# Patient Record
Sex: Female | Born: 1950 | Race: White | Hispanic: No | Smoking: Never smoker
Health system: Southern US, Community
[De-identification: ages and names within clinical notes are randomized; demographics above are authoritative.]

## PROBLEM LIST (undated history)

## (undated) DIAGNOSIS — R609 Edema, unspecified: Secondary | ICD-10-CM

## (undated) DIAGNOSIS — K5730 Diverticulosis of large intestine without perforation or abscess without bleeding: Secondary | ICD-10-CM

## (undated) DIAGNOSIS — T7840XA Allergy, unspecified, initial encounter: Secondary | ICD-10-CM

## (undated) DIAGNOSIS — I10 Essential (primary) hypertension: Secondary | ICD-10-CM

## (undated) DIAGNOSIS — E119 Type 2 diabetes mellitus without complications: Secondary | ICD-10-CM

## (undated) DIAGNOSIS — M1990 Unspecified osteoarthritis, unspecified site: Secondary | ICD-10-CM

## (undated) DIAGNOSIS — J45909 Unspecified asthma, uncomplicated: Secondary | ICD-10-CM

## (undated) DIAGNOSIS — F341 Dysthymic disorder: Secondary | ICD-10-CM

## (undated) DIAGNOSIS — R51 Headache: Secondary | ICD-10-CM

## (undated) DIAGNOSIS — E785 Hyperlipidemia, unspecified: Secondary | ICD-10-CM

## (undated) DIAGNOSIS — R109 Unspecified abdominal pain: Secondary | ICD-10-CM

## (undated) DIAGNOSIS — E039 Hypothyroidism, unspecified: Secondary | ICD-10-CM

## (undated) DIAGNOSIS — D649 Anemia, unspecified: Secondary | ICD-10-CM

## (undated) DIAGNOSIS — M109 Gout, unspecified: Secondary | ICD-10-CM

## (undated) DIAGNOSIS — IMO0002 Reserved for concepts with insufficient information to code with codable children: Secondary | ICD-10-CM

## (undated) DIAGNOSIS — G2581 Restless legs syndrome: Secondary | ICD-10-CM

## (undated) DIAGNOSIS — F419 Anxiety disorder, unspecified: Secondary | ICD-10-CM

## (undated) HISTORY — DX: Type 2 diabetes mellitus without complications: E11.9

## (undated) HISTORY — PX: ABDOMINAL HYSTERECTOMY: SHX81

## (undated) HISTORY — DX: Hypothyroidism, unspecified: E03.9

## (undated) HISTORY — DX: Unspecified osteoarthritis, unspecified site: M19.90

## (undated) HISTORY — DX: Dysthymic disorder: F34.1

## (undated) HISTORY — DX: Restless legs syndrome: G25.81

## (undated) HISTORY — DX: Unspecified asthma, uncomplicated: J45.909

## (undated) HISTORY — PX: UPPER GASTROINTESTINAL ENDOSCOPY: SHX188

## (undated) HISTORY — DX: Unspecified abdominal pain: R10.9

## (undated) HISTORY — DX: Hyperlipidemia, unspecified: E78.5

## (undated) HISTORY — DX: Allergy, unspecified, initial encounter: T78.40XA

## (undated) HISTORY — DX: Diverticulosis of large intestine without perforation or abscess without bleeding: K57.30

## (undated) HISTORY — PX: OTHER SURGICAL HISTORY: SHX169

## (undated) HISTORY — DX: Anxiety disorder, unspecified: F41.9

## (undated) HISTORY — DX: Anemia, unspecified: D64.9

## (undated) HISTORY — DX: Edema, unspecified: R60.9

## (undated) HISTORY — DX: Reserved for concepts with insufficient information to code with codable children: IMO0002

## (undated) HISTORY — DX: Headache: R51

## (undated) HISTORY — PX: APPENDECTOMY: SHX54

## (undated) HISTORY — DX: Gout, unspecified: M10.9

## (undated) HISTORY — DX: Essential (primary) hypertension: I10

---

## 1999-02-16 ENCOUNTER — Encounter: Payer: Self-pay | Admitting: Family Medicine

## 1999-02-16 ENCOUNTER — Encounter: Admission: RE | Admit: 1999-02-16 | Discharge: 1999-02-16 | Payer: Self-pay | Admitting: Family Medicine

## 1999-12-01 ENCOUNTER — Other Ambulatory Visit: Admission: RE | Admit: 1999-12-01 | Discharge: 1999-12-01 | Payer: Self-pay | Admitting: Family Medicine

## 2000-03-12 ENCOUNTER — Encounter: Admission: RE | Admit: 2000-03-12 | Discharge: 2000-03-12 | Payer: Self-pay | Admitting: Family Medicine

## 2000-03-12 ENCOUNTER — Encounter: Payer: Self-pay | Admitting: Family Medicine

## 2000-12-30 ENCOUNTER — Other Ambulatory Visit: Admission: RE | Admit: 2000-12-30 | Discharge: 2000-12-30 | Payer: Self-pay | Admitting: Family Medicine

## 2001-03-17 ENCOUNTER — Encounter: Payer: Self-pay | Admitting: Family Medicine

## 2001-03-17 ENCOUNTER — Encounter: Admission: RE | Admit: 2001-03-17 | Discharge: 2001-03-17 | Payer: Self-pay | Admitting: Family Medicine

## 2002-01-27 ENCOUNTER — Encounter: Admission: RE | Admit: 2002-01-27 | Discharge: 2002-01-27 | Payer: Self-pay | Admitting: Family Medicine

## 2002-01-27 ENCOUNTER — Encounter: Payer: Self-pay | Admitting: Family Medicine

## 2002-02-12 ENCOUNTER — Other Ambulatory Visit: Admission: RE | Admit: 2002-02-12 | Discharge: 2002-02-12 | Payer: Self-pay | Admitting: Family Medicine

## 2002-05-19 ENCOUNTER — Encounter (INDEPENDENT_AMBULATORY_CARE_PROVIDER_SITE_OTHER): Payer: Self-pay | Admitting: Specialist

## 2002-05-19 ENCOUNTER — Inpatient Hospital Stay (HOSPITAL_COMMUNITY): Admission: RE | Admit: 2002-05-19 | Discharge: 2002-05-21 | Payer: Self-pay | Admitting: Obstetrics and Gynecology

## 2003-01-28 ENCOUNTER — Encounter: Admission: RE | Admit: 2003-01-28 | Discharge: 2003-01-28 | Payer: Self-pay | Admitting: Family Medicine

## 2003-01-28 ENCOUNTER — Encounter: Payer: Self-pay | Admitting: Family Medicine

## 2003-02-28 ENCOUNTER — Encounter: Admission: RE | Admit: 2003-02-28 | Discharge: 2003-02-28 | Payer: Self-pay | Admitting: Internal Medicine

## 2004-01-11 ENCOUNTER — Emergency Department (HOSPITAL_COMMUNITY): Admission: EM | Admit: 2004-01-11 | Discharge: 2004-01-11 | Payer: Self-pay | Admitting: Emergency Medicine

## 2004-02-15 ENCOUNTER — Ambulatory Visit: Payer: Self-pay | Admitting: Family Medicine

## 2004-02-22 ENCOUNTER — Ambulatory Visit: Payer: Self-pay | Admitting: Family Medicine

## 2004-03-14 ENCOUNTER — Encounter: Admission: RE | Admit: 2004-03-14 | Discharge: 2004-03-14 | Payer: Self-pay | Admitting: Family Medicine

## 2004-04-13 ENCOUNTER — Encounter: Admission: RE | Admit: 2004-04-13 | Discharge: 2004-04-13 | Payer: Self-pay | Admitting: Family Medicine

## 2005-01-08 ENCOUNTER — Ambulatory Visit: Payer: Self-pay | Admitting: Family Medicine

## 2005-01-08 ENCOUNTER — Observation Stay (HOSPITAL_COMMUNITY): Admission: EM | Admit: 2005-01-08 | Discharge: 2005-01-09 | Payer: Self-pay | Admitting: Emergency Medicine

## 2005-01-08 ENCOUNTER — Encounter (INDEPENDENT_AMBULATORY_CARE_PROVIDER_SITE_OTHER): Payer: Self-pay | Admitting: *Deleted

## 2005-05-24 ENCOUNTER — Ambulatory Visit: Payer: Self-pay | Admitting: Family Medicine

## 2005-05-24 ENCOUNTER — Encounter: Admission: RE | Admit: 2005-05-24 | Discharge: 2005-05-24 | Payer: Self-pay | Admitting: Family Medicine

## 2005-05-31 ENCOUNTER — Other Ambulatory Visit: Admission: RE | Admit: 2005-05-31 | Discharge: 2005-05-31 | Payer: Self-pay | Admitting: Obstetrics and Gynecology

## 2005-05-31 ENCOUNTER — Ambulatory Visit: Payer: Self-pay | Admitting: Family Medicine

## 2005-06-15 ENCOUNTER — Encounter: Admission: RE | Admit: 2005-06-15 | Discharge: 2005-06-15 | Payer: Self-pay | Admitting: Family Medicine

## 2005-09-24 ENCOUNTER — Ambulatory Visit: Payer: Self-pay | Admitting: Family Medicine

## 2006-01-28 ENCOUNTER — Ambulatory Visit: Payer: Self-pay | Admitting: Family Medicine

## 2006-05-21 ENCOUNTER — Ambulatory Visit: Payer: Self-pay | Admitting: Family Medicine

## 2006-05-21 LAB — CONVERTED CEMR LAB
ALT: 19 units/L (ref 0–40)
AST: 20 units/L (ref 0–37)
Albumin: 3.4 g/dL — ABNORMAL LOW (ref 3.5–5.2)
Alkaline Phosphatase: 82 units/L (ref 39–117)
BUN: 13 mg/dL (ref 6–23)
Basophils Absolute: 0.1 10*3/uL (ref 0.0–0.1)
Basophils Relative: 1 % (ref 0.0–1.0)
Bilirubin, Direct: 0.2 mg/dL (ref 0.0–0.3)
CO2: 31 meq/L (ref 19–32)
Calcium: 8.9 mg/dL (ref 8.4–10.5)
Chloride: 106 meq/L (ref 96–112)
Cholesterol: 172 mg/dL (ref 0–200)
Creatinine, Ser: 0.7 mg/dL (ref 0.4–1.2)
Eosinophils Absolute: 0.2 10*3/uL (ref 0.0–0.6)
Eosinophils Relative: 3.2 % (ref 0.0–5.0)
GFR calc Af Amer: 113 mL/min
GFR calc non Af Amer: 93 mL/min
Glucose, Bld: 106 mg/dL — ABNORMAL HIGH (ref 70–99)
HCT: 38 % (ref 36.0–46.0)
HDL: 41 mg/dL (ref >39.0)
Hemoglobin: 13.1 g/dL (ref 12.0–15.0)
Hgb A1c MFr Bld: 6.6 % — ABNORMAL HIGH (ref 4.6–6.0)
LDL Cholesterol: 98 mg/dL (ref 0–99)
Lymphocytes Relative: 25.9 % (ref 12.0–46.0)
MCHC: 34.4 g/dL (ref 30.0–36.0)
MCV: 92.1 fL (ref 78.0–100.0)
Monocytes Absolute: 0.3 10*3/uL (ref 0.2–0.7)
Monocytes Relative: 4.2 % (ref 3.0–11.0)
Neutro Abs: 3.9 10*3/uL (ref 1.4–7.7)
Neutrophils Relative %: 65.7 % (ref 43.0–77.0)
Platelets: 272 10*3/uL (ref 150–400)
Potassium: 3.7 meq/L (ref 3.5–5.1)
RBC: 4.13 M/uL (ref 3.87–5.11)
RDW: 13.5 % (ref 11.5–14.6)
Sodium: 144 meq/L (ref 135–145)
TSH: 2.88 microintl units/mL (ref 0.35–5.50)
Total Bilirubin: 0.5 mg/dL (ref 0.3–1.2)
Total CHOL/HDL Ratio: 4.2
Total Protein: 6.3 g/dL (ref 6.0–8.3)
Triglycerides: 163 mg/dL — ABNORMAL HIGH (ref 0–149)
VLDL: 33 mg/dL (ref 0–40)
WBC: 6.1 10*3/uL (ref 4.5–10.5)

## 2006-05-28 ENCOUNTER — Ambulatory Visit: Payer: Self-pay | Admitting: Family Medicine

## 2006-06-25 ENCOUNTER — Ambulatory Visit: Payer: Self-pay | Admitting: Internal Medicine

## 2006-06-25 ENCOUNTER — Encounter: Admission: RE | Admit: 2006-06-25 | Discharge: 2006-06-25 | Payer: Self-pay | Admitting: Family Medicine

## 2006-07-10 ENCOUNTER — Ambulatory Visit (HOSPITAL_COMMUNITY): Admission: RE | Admit: 2006-07-10 | Discharge: 2006-07-10 | Payer: Self-pay | Admitting: Obstetrics and Gynecology

## 2006-08-28 ENCOUNTER — Ambulatory Visit: Payer: Self-pay | Admitting: Family Medicine

## 2006-08-28 LAB — CONVERTED CEMR LAB
Basophils Absolute: 0 10*3/uL (ref 0.0–0.1)
Basophils Relative: 0.3 % (ref 0.0–1.0)
Eosinophils Absolute: 0.1 10*3/uL (ref 0.0–0.6)
Eosinophils Relative: 2.3 % (ref 0.0–5.0)
Glucose, Bld: 92 mg/dL (ref 70–99)
HCT: 41 % (ref 36.0–46.0)
Hemoglobin: 13.9 g/dL (ref 12.0–15.0)
Hgb A1c MFr Bld: 6.2 % — ABNORMAL HIGH (ref 4.6–6.0)
Lymphocytes Relative: 31.6 % (ref 12.0–46.0)
MCHC: 33.9 g/dL (ref 30.0–36.0)
MCV: 90.5 fL (ref 78.0–100.0)
Monocytes Absolute: 0.3 10*3/uL (ref 0.2–0.7)
Monocytes Relative: 5.6 % (ref 3.0–11.0)
Neutro Abs: 3.2 10*3/uL (ref 1.4–7.7)
Neutrophils Relative %: 60.2 % (ref 43.0–77.0)
Platelets: 297 10*3/uL (ref 150–400)
RBC: 4.53 M/uL (ref 3.87–5.11)
RDW: 12.9 % (ref 11.5–14.6)
Rheumatoid fact SerPl-aCnc: <20 intl units/mL — ABNORMAL LOW (ref 0.0–20.0)
Total CK: 92 units/L (ref 7–177)
WBC: 5.3 10*3/uL (ref 4.5–10.5)

## 2006-08-29 ENCOUNTER — Encounter: Payer: Self-pay | Admitting: Family Medicine

## 2006-08-29 LAB — CONVERTED CEMR LAB: Anti Nuclear Antibody(ANA): NEGATIVE

## 2006-10-02 ENCOUNTER — Encounter: Admission: RE | Admit: 2006-10-02 | Discharge: 2006-10-02 | Payer: Self-pay | Admitting: Orthopaedic Surgery

## 2006-12-11 DIAGNOSIS — E119 Type 2 diabetes mellitus without complications: Secondary | ICD-10-CM

## 2006-12-11 DIAGNOSIS — E039 Hypothyroidism, unspecified: Secondary | ICD-10-CM

## 2006-12-11 DIAGNOSIS — K5730 Diverticulosis of large intestine without perforation or abscess without bleeding: Secondary | ICD-10-CM | POA: Insufficient documentation

## 2006-12-11 DIAGNOSIS — IMO0002 Reserved for concepts with insufficient information to code with codable children: Secondary | ICD-10-CM | POA: Insufficient documentation

## 2006-12-11 DIAGNOSIS — R519 Headache, unspecified: Secondary | ICD-10-CM | POA: Insufficient documentation

## 2006-12-11 DIAGNOSIS — E1165 Type 2 diabetes mellitus with hyperglycemia: Secondary | ICD-10-CM

## 2006-12-11 DIAGNOSIS — R51 Headache: Secondary | ICD-10-CM

## 2006-12-11 HISTORY — DX: Type 2 diabetes mellitus without complications: E11.9

## 2006-12-11 HISTORY — DX: Hypothyroidism, unspecified: E03.9

## 2006-12-11 HISTORY — DX: Headache: R51

## 2006-12-11 HISTORY — DX: Diverticulosis of large intestine without perforation or abscess without bleeding: K57.30

## 2007-05-29 ENCOUNTER — Telehealth: Payer: Self-pay | Admitting: Family Medicine

## 2007-06-06 ENCOUNTER — Telehealth: Payer: Self-pay | Admitting: Family Medicine

## 2007-06-23 ENCOUNTER — Ambulatory Visit: Payer: Self-pay | Admitting: Family Medicine

## 2007-06-23 LAB — CONVERTED CEMR LAB
ALT: 18 units/L (ref 0–35)
AST: 17 units/L (ref 0–37)
Albumin: 3.7 g/dL (ref 3.5–5.2)
Alkaline Phosphatase: 75 units/L (ref 39–117)
BUN: 13 mg/dL (ref 6–23)
Basophils Absolute: 0 10*3/uL (ref 0.0–0.1)
Basophils Relative: 0.9 % (ref 0.0–1.0)
Bilirubin Urine: NEGATIVE
Bilirubin, Direct: 0.1 mg/dL (ref 0.0–0.3)
Blood in Urine, dipstick: NEGATIVE
CO2: 30 meq/L (ref 19–32)
Calcium: 9 mg/dL (ref 8.4–10.5)
Chloride: 109 meq/L (ref 96–112)
Cholesterol: 161 mg/dL (ref 0–200)
Creatinine, Ser: 0.9 mg/dL (ref 0.4–1.2)
Creatinine,U: 151.2 mg/dL
Eosinophils Absolute: 0.2 10*3/uL (ref 0.0–0.6)
Eosinophils Relative: 2.9 % (ref 0.0–5.0)
GFR calc Af Amer: 84 mL/min
GFR calc non Af Amer: 69 mL/min
Glucose, Bld: 98 mg/dL (ref 70–99)
Glucose, Urine, Semiquant: NEGATIVE
HCT: 40.5 % (ref 36.0–46.0)
HDL: 41.2 mg/dL (ref >39.0)
Hemoglobin: 13.1 g/dL (ref 12.0–15.0)
Hgb A1c MFr Bld: 6.2 % — ABNORMAL HIGH (ref 4.6–6.0)
Ketones, urine, test strip: NEGATIVE
LDL Cholesterol: 83 mg/dL (ref 0–99)
Lymphocytes Relative: 31.8 % (ref 12.0–46.0)
MCHC: 32.3 g/dL (ref 30.0–36.0)
MCV: 93.1 fL (ref 78.0–100.0)
Microalb Creat Ratio: 5.3 mg/g (ref 0.0–30.0)
Microalb, Ur: 0.8 mg/dL (ref 0.0–1.9)
Monocytes Absolute: 0.3 10*3/uL (ref 0.2–0.7)
Monocytes Relative: 6.2 % (ref 3.0–11.0)
Neutro Abs: 3.3 10*3/uL (ref 1.4–7.7)
Neutrophils Relative %: 58.2 % (ref 43.0–77.0)
Nitrite: NEGATIVE
Platelets: 248 10*3/uL (ref 150–400)
Potassium: 3.7 meq/L (ref 3.5–5.1)
Protein, U semiquant: NEGATIVE
RBC: 4.35 M/uL (ref 3.87–5.11)
RDW: 12.8 % (ref 11.5–14.6)
Sodium: 145 meq/L (ref 135–145)
Specific Gravity, Urine: 1.02
TSH: 1.31 microintl units/mL (ref 0.35–5.50)
Total Bilirubin: 1 mg/dL (ref 0.3–1.2)
Total CHOL/HDL Ratio: 3.9
Total Protein: 6.3 g/dL (ref 6.0–8.3)
Triglycerides: 182 mg/dL — ABNORMAL HIGH (ref 0–149)
Urobilinogen, UA: 0.2
VLDL: 36 mg/dL (ref 0–40)
WBC: 5.5 10*3/uL (ref 4.5–10.5)
pH: 6

## 2007-07-17 ENCOUNTER — Encounter: Admission: RE | Admit: 2007-07-17 | Discharge: 2007-07-17 | Payer: Self-pay | Admitting: Family Medicine

## 2007-08-14 ENCOUNTER — Ambulatory Visit: Payer: Self-pay | Admitting: Family Medicine

## 2007-08-14 DIAGNOSIS — R109 Unspecified abdominal pain: Secondary | ICD-10-CM

## 2007-08-14 DIAGNOSIS — IMO0002 Reserved for concepts with insufficient information to code with codable children: Secondary | ICD-10-CM

## 2007-08-14 HISTORY — DX: Unspecified abdominal pain: R10.9

## 2007-08-14 HISTORY — DX: Reserved for concepts with insufficient information to code with codable children: IMO0002

## 2007-09-15 ENCOUNTER — Ambulatory Visit: Payer: Self-pay | Admitting: Family Medicine

## 2007-09-15 DIAGNOSIS — F341 Dysthymic disorder: Secondary | ICD-10-CM

## 2007-09-15 HISTORY — DX: Dysthymic disorder: F34.1

## 2008-08-06 ENCOUNTER — Ambulatory Visit: Payer: Self-pay | Admitting: Family Medicine

## 2008-08-06 ENCOUNTER — Encounter: Admission: RE | Admit: 2008-08-06 | Discharge: 2008-08-06 | Payer: Self-pay | Admitting: Family Medicine

## 2008-08-06 LAB — CONVERTED CEMR LAB
ALT: 27 units/L (ref 0–35)
AST: 22 units/L (ref 0–37)
Albumin: 3.5 g/dL (ref 3.5–5.2)
Alkaline Phosphatase: 80 units/L (ref 39–117)
BUN: 14 mg/dL (ref 6–23)
Basophils Absolute: 0 10*3/uL (ref 0.0–0.1)
Basophils Relative: 0.3 % (ref 0.0–3.0)
Bilirubin Urine: NEGATIVE
Bilirubin, Direct: 0.1 mg/dL (ref 0.0–0.3)
Blood in Urine, dipstick: NEGATIVE
CO2: 33 meq/L — ABNORMAL HIGH (ref 19–32)
Calcium: 8.9 mg/dL (ref 8.4–10.5)
Chloride: 105 meq/L (ref 96–112)
Cholesterol: 136 mg/dL (ref 0–200)
Creatinine, Ser: 0.8 mg/dL (ref 0.4–1.2)
Creatinine,U: 319.7 mg/dL
Eosinophils Absolute: 0.2 10*3/uL (ref 0.0–0.7)
Eosinophils Relative: 3 % (ref 0.0–5.0)
GFR calc non Af Amer: 78.83 mL/min (ref >60)
Glucose, Bld: 83 mg/dL (ref 70–99)
Glucose, Urine, Semiquant: NEGATIVE
HCT: 38.7 % (ref 36.0–46.0)
HDL: 35.1 mg/dL — ABNORMAL LOW (ref >39.00)
Hemoglobin: 13.3 g/dL (ref 12.0–15.0)
Hgb A1c MFr Bld: 6.4 % (ref 4.6–6.5)
LDL Cholesterol: 73 mg/dL (ref 0–99)
Lymphocytes Relative: 30.8 % (ref 12.0–46.0)
Lymphs Abs: 1.8 10*3/uL (ref 0.7–4.0)
MCHC: 34.4 g/dL (ref 30.0–36.0)
MCV: 90.4 fL (ref 78.0–100.0)
Microalb Creat Ratio: 2.8 mg/g (ref 0.0–30.0)
Microalb, Ur: 0.9 mg/dL (ref 0.0–1.9)
Monocytes Absolute: 0.3 10*3/uL (ref 0.1–1.0)
Monocytes Relative: 6.1 % (ref 3.0–12.0)
Neutro Abs: 3.4 10*3/uL (ref 1.4–7.7)
Neutrophils Relative %: 59.8 % (ref 43.0–77.0)
Nitrite: NEGATIVE
Platelets: 235 10*3/uL (ref 150.0–400.0)
Potassium: 3.3 meq/L — ABNORMAL LOW (ref 3.5–5.1)
RBC: 4.28 M/uL (ref 3.87–5.11)
RDW: 12.8 % (ref 11.5–14.6)
Sodium: 143 meq/L (ref 135–145)
Specific Gravity, Urine: 1.025
TSH: 3.4 microintl units/mL (ref 0.35–5.50)
Total Bilirubin: 0.6 mg/dL (ref 0.3–1.2)
Total CHOL/HDL Ratio: 4
Total Protein: 6.5 g/dL (ref 6.0–8.3)
Triglycerides: 141 mg/dL (ref 0.0–149.0)
Urobilinogen, UA: 0.2
VLDL: 28.2 mg/dL (ref 0.0–40.0)
WBC Urine, dipstick: NEGATIVE
WBC: 5.7 10*3/uL (ref 4.5–10.5)
pH: 5.5

## 2008-08-20 ENCOUNTER — Ambulatory Visit: Payer: Self-pay | Admitting: Family Medicine

## 2008-09-16 DIAGNOSIS — J069 Acute upper respiratory infection, unspecified: Secondary | ICD-10-CM | POA: Insufficient documentation

## 2008-09-21 ENCOUNTER — Ambulatory Visit: Payer: Self-pay | Admitting: Family Medicine

## 2009-01-15 DIAGNOSIS — R609 Edema, unspecified: Secondary | ICD-10-CM

## 2009-01-15 HISTORY — DX: Edema, unspecified: R60.9

## 2009-01-18 ENCOUNTER — Ambulatory Visit: Payer: Self-pay

## 2009-01-18 ENCOUNTER — Ambulatory Visit: Payer: Self-pay | Admitting: Family Medicine

## 2009-05-15 DIAGNOSIS — J45909 Unspecified asthma, uncomplicated: Secondary | ICD-10-CM

## 2009-05-15 HISTORY — DX: Unspecified asthma, uncomplicated: J45.909

## 2009-05-17 ENCOUNTER — Ambulatory Visit: Payer: Self-pay | Admitting: Family Medicine

## 2009-05-19 ENCOUNTER — Ambulatory Visit: Payer: Self-pay | Admitting: Family Medicine

## 2009-09-22 ENCOUNTER — Telehealth: Payer: Self-pay | Admitting: Family Medicine

## 2009-09-28 ENCOUNTER — Ambulatory Visit: Payer: Self-pay | Admitting: Family Medicine

## 2009-09-28 LAB — CONVERTED CEMR LAB
ALT: 20 units/L (ref 0–35)
AST: 22 units/L (ref 0–37)
Albumin: 4.1 g/dL (ref 3.5–5.2)
Alkaline Phosphatase: 82 units/L (ref 39–117)
BUN: 19 mg/dL (ref 6–23)
Basophils Absolute: 0 10*3/uL (ref 0.0–0.1)
Basophils Relative: 0.5 % (ref 0.0–3.0)
Bilirubin Urine: NEGATIVE
Bilirubin, Direct: 0.2 mg/dL (ref 0.0–0.3)
Blood in Urine, dipstick: NEGATIVE
CO2: 32 meq/L (ref 19–32)
Calcium: 9.6 mg/dL (ref 8.4–10.5)
Chloride: 102 meq/L (ref 96–112)
Cholesterol: 182 mg/dL (ref 0–200)
Creatinine, Ser: 0.8 mg/dL (ref 0.4–1.2)
Creatinine,U: 190.7 mg/dL
Eosinophils Absolute: 0.2 10*3/uL (ref 0.0–0.7)
Eosinophils Relative: 2.9 % (ref 0.0–5.0)
GFR calc non Af Amer: 80.83 mL/min (ref >60)
Glucose, Bld: 140 mg/dL — ABNORMAL HIGH (ref 70–99)
Glucose, Urine, Semiquant: NEGATIVE
HCT: 40.3 % (ref 36.0–46.0)
HDL: 46.8 mg/dL (ref >39.00)
Hemoglobin: 13.9 g/dL (ref 12.0–15.0)
Hgb A1c MFr Bld: 6.4 % (ref 4.6–6.5)
Ketones, urine, test strip: NEGATIVE
LDL Cholesterol: 103 mg/dL — ABNORMAL HIGH (ref 0–99)
Lymphocytes Relative: 31.2 % (ref 12.0–46.0)
Lymphs Abs: 1.7 10*3/uL (ref 0.7–4.0)
MCHC: 34.5 g/dL (ref 30.0–36.0)
MCV: 91.4 fL (ref 78.0–100.0)
Microalb Creat Ratio: 0.5 mg/g (ref 0.0–30.0)
Microalb, Ur: 1 mg/dL (ref 0.0–1.9)
Monocytes Absolute: 0.4 10*3/uL (ref 0.1–1.0)
Monocytes Relative: 7.3 % (ref 3.0–12.0)
Neutro Abs: 3.2 10*3/uL (ref 1.4–7.7)
Neutrophils Relative %: 58.1 % (ref 43.0–77.0)
Nitrite: NEGATIVE
Platelets: 237 10*3/uL (ref 150.0–400.0)
Potassium: 4.3 meq/L (ref 3.5–5.1)
Protein, U semiquant: NEGATIVE
RBC: 4.41 M/uL (ref 3.87–5.11)
RDW: 13.4 % (ref 11.5–14.6)
Sodium: 142 meq/L (ref 135–145)
Specific Gravity, Urine: 1.025
TSH: 3.48 microintl units/mL (ref 0.35–5.50)
Total Bilirubin: 0.9 mg/dL (ref 0.3–1.2)
Total CHOL/HDL Ratio: 4
Total Protein: 6.7 g/dL (ref 6.0–8.3)
Triglycerides: 161 mg/dL — ABNORMAL HIGH (ref 0.0–149.0)
Urobilinogen, UA: 0.2
VLDL: 32.2 mg/dL (ref 0.0–40.0)
WBC: 5.5 10*3/uL (ref 4.5–10.5)
pH: 6

## 2009-10-06 ENCOUNTER — Encounter: Admission: RE | Admit: 2009-10-06 | Discharge: 2009-10-06 | Payer: Self-pay | Admitting: Family Medicine

## 2009-10-06 ENCOUNTER — Ambulatory Visit: Payer: Self-pay | Admitting: Family Medicine

## 2009-10-08 LAB — HM MAMMOGRAPHY: HM Mammogram: NEGATIVE

## 2010-05-07 ENCOUNTER — Encounter: Payer: Self-pay | Admitting: Orthopaedic Surgery

## 2010-05-07 ENCOUNTER — Encounter: Payer: Self-pay | Admitting: Obstetrics and Gynecology

## 2010-05-18 NOTE — Progress Notes (Signed)
Summary: refill  Phone Note Refill Request Message from:  Fax from Pharmacy on September 22, 2009 10:55 AM  Refills Requested: Medication #1:  SYNTHROID 88 MCG  TABS Take 1 tablet by mouth once a day Initial call taken by: Kern Reap CMA Duncan Dull),  September 22, 2009 10:55 AM    Prescriptions: SYNTHROID 88 MCG  TABS (LEVOTHYROXINE SODIUM) Take 1 tablet by mouth once a day  #100 Tablet x 0   Entered by:   Kern Reap CMA (AAMA)   Authorized by:   Roderick Pee MD   Signed by:   Kern Reap CMA (AAMA) on 09/22/2009   Method used:   Faxed to ...       [facility])       [address] [address]       [place], [place]  [address]       Ph: [phone]       Fax: [phone]   RxID:   [id number]

## 2010-05-18 NOTE — Letter (Signed)
Summary: Out of Work  Adult nurse at [employer]  [address]   [place], [place] [address]   Phone: [phone]  Fax: [phone]    May 19, 2009   Employee:  KEIRSTAN IANNELLO [facility]    To Whom It May Concern:   For Medical reasons, please excuse the above named employee from work for the following dates:  Start:   May 17, 2009  End:   May 23, 2009  If you need additional information, please feel free to contact our office.         Sincerely,    Kelle Darting, MD
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
## 2010-05-18 NOTE — Assessment & Plan Note (Signed)
Summary: COUGH,CONGESTION,FEVER // RS   Vital Signs:  Patient profile:   60 year old female Menstrual status:  hysterectomy Weight:      174 pounds Temp:     97.6 degrees F BP sitting:   120 / 80  (left arm) Cuff size:   regular  Vitals Entered By: Kern Reap CMA Duncan Dull) (May 17, 2009 12:08 PM)  Reason for Visit cough, and sycope  History of Present Illness: [name] is a 60 year old, married female, nonsmoker, but who has been exposed to secondhand smoke in the past and has no history of asthma nor allergies who comes in today for evaluation of wheezing.  She states she felt well until Sunday when she developed head congestion, sore throat, cough, and wheezing.  Temp was 102.  No fever since then.  No sputum production.  Last night.  She coughs so hard she passed out.  She did not injure herself.  She has no earache, sore throat, nausea, vomiting, or diarrhea.  She did have her seasonal flu shot.  Allergies: 1)  ! Benadryl 2)  ! Promethazine Hcl (Promethazine Hcl) 3)  ! Talwin 4)  ! Duradryl 5)  ! Quinine 6)  ! Asa 7)  ! Codeine 8)  ! Hydrocodone 9)  ! * Trimox 10)  ! Indocin  Past History:  Past medical, surgical, family and social histories (including risk factors) reviewed, and no changes noted (except as noted below).  Past Medical History: Reviewed history from 12/11/2006 and no changes required. Diabetes mellitus, type II Diverticulosis, colon Headache Hypothyroidism DUB (PMS) Insomnia  Past Surgical History: Reviewed history from 12/11/2006 and no changes required. Hysterectomy Appendectomy  Family History: Reviewed history from 08/14/2007 and no changes required. father died 54, COPD mother died at 41, diabetes, and COPD  No brothers.  Five sisters two diabetics one has a goiter.  The other two in good health  Social History: Reviewed history from 08/14/2007 and no changes required. Never Smoked Occupation: [employer] in [employer],  decreased hours because the economy Married Alcohol use-no Drug use-no Regular exercise-no  Review of Systems      See HPI  Physical Exam  General:  Well-developed,well-nourished,in no acute distress; alert,appropriate and cooperative throughout examination Head:  Normocephalic and atraumatic without obvious abnormalities. No apparent alopecia or balding. Eyes:  No corneal or conjunctival inflammation noted. EOMI. Perrla. Funduscopic exam benign, without hemorrhages, exudates or papilledema. Vision grossly normal. Ears:  External ear exam shows no significant lesions or deformities.  Otoscopic examination reveals clear canals, tympanic membranes are intact bilaterally without bulging, retraction, inflammation or discharge. Hearing is grossly normal bilaterally. Nose:  External nasal examination shows no deformity or inflammation. Nasal mucosa are pink and moist without lesions or exudates. Mouth:  Oral mucosa and oropharynx without lesions or exudates.  Teeth in good repair. Neck:  No deformities, masses, or tenderness noted. Chest Wall:  No deformities, masses, or tenderness noted. Lungs:  symmetrical breath sounds bilateral wheezing   Problems:  Medical Problems Added: 1)  Dx of Asthma  (ICD-493.90)  Impression & Recommendations:  Problem # 1:  ASTHMA (ICD-493.90) Assessment New  Her updated medication list for this problem includes:    Prednisone 20 Mg Tabs (Prednisone) ..... Uad  Orders: Prescription Created Electronically [phone])  Complete Medication List: 1)  Synthroid 88 Mcg Tabs (Levothyroxine sodium) .... Take 1 tablet by mouth once a day 2)  Flexeril 10 Mg Tabs (Cyclobenzaprine hcl) .... Take 1 tablet by mouth at bedtime 3)  Mirapex 1 Mg Tabs (Pramipexole dihydrochloride) .... Take 1 tablet by mouth at bedtime 4)  Tenoretic 50 50-25 Mg Tabs (Atenolol-chlorthalidone) .... One by mouth daily 5)  Sertraline Hcl 100 Mg Tabs (Sertraline hcl) .Marland Kitchen.. 1 tab @ bedtime 6)   Darvocet-n 100 100-650 Mg Tabs (Propoxyphene n-apap) 7)  Calcium 600 Mg Tabs (Calcium) .... Take one tablet daily 8)  Vitamin D  9)  Eq Fiber Therapy 0.52 Gm Caps (Psyllium) .... Once daily 10)  Daily-vitamin Tabs (Multiple vitamin) .... Once daily 11)  Derma Patch  .... Use for vertigo 12)  Adult Aspirin Ec Low Strength 81 Mg Tbec (Aspirin) .... Once daily 13)  Dermotic 0.01 % Oil (Fluocinolone acetonide) .... Once daily 14)  Pred Forte 1 % Susp (Prednisolone acetate) .... Once daily 15)  Premarin 0.625 Mg/gm Crea (Estrogens, conjugated) .... Apply 2 x week 16)  Transderm-scop 1.5 Mg Pt72 (Scopolamine base) .... One patch q 3 days as needed 17)  Hydrochlorothiazide 25 Mg Tabs (Hydrochlorothiazide) .... Take 1 tablet by mouth every morning 18)  Prednisone 20 Mg Tabs (Prednisone) .... Uad 19)  Doxycycline Hyclate 100 Mg Caps (Doxycycline hyclate) .... Take 1 tablet by mouth two times a day 20)  Hydrocodone-homatropine 5-1.5 Mg/4ml Syrp (Hydrocodone-homatropine) .Marland Kitchen.. 1 or 2 tsps three times a day as needed  Patient Instructions: 1)  drink 30 ounces of water daily. 2)  It may take one or 2 teaspoons of Hydromet up to 3 times a day as needed for cough. 3)  Begin doxycycline 100 mg b.i.d. 4)  Begin prednisone two tabs now then two tabs q.a.m. recheck Thursday here in the office. 5)  Rest at home do not work Prescriptions: HYDROCODONE-HOMATROPINE 5-1.5 MG/5ML SYRP (HYDROCODONE-HOMATROPINE) 1 or 2 tsps three times a day as needed  #8oz x 1   Entered and Authorized by:   Roderick Pee MD   Signed by:   Roderick Pee MD on 05/17/2009   Method used:   Print then Give to Patient   RxID:   [id number] DOXYCYCLINE HYCLATE 100 MG CAPS (DOXYCYCLINE HYCLATE) Take 1 tablet by mouth two times a day  #20 x 9   Entered and Authorized by:   Roderick Pee MD   Signed by:   Roderick Pee MD on 05/17/2009   Method used:   Electronically to        [facility] [phone]* (retail)       [address]  [place], [place]  [address]       Ph: [phone] or [phone]       Fax: [phone]   RxID:   [id number] PREDNISONE 20 MG TABS (PREDNISONE) UAD  #40 x 0   Entered and Authorized by:   Roderick Pee MD   Signed by:   Roderick Pee MD on 05/17/2009   Method used:   Electronically to        [facility] [phone]* (retail)       [address] [place], [place]  [address]       Ph: [phone] or [phone]       Fax: [phone]   RxID:   [id number]

## 2010-05-18 NOTE — Assessment & Plan Note (Signed)
Summary: 2 day rov/njr   Vital Signs:  Patient profile:   60 year old female Menstrual status:  hysterectomy Temp:     97.5 degrees F BP sitting:   120 / 84  (left arm)  Vitals Entered By: Kern Reap CMA Duncan Dull) (May 19, 2009 1:39 PM)  Contraindications/Deferment of Procedures/Staging:    Test/Procedure: Weight Refused    Reason for deferment: patient declined-cannot calculate BMI   History of Present Illness: [name] is a 60 year old female, nonsmoker, who comes back today for evaluation of asthma.  We saw her 3 days ago with a viral infection that triggered her asthma.  She spent bed rest at home on prednisone and doxycycline and Hydromet cough syrup.  She is much better.  She sevens, and nausea, probably related to Hydromet.  No other side effects.    Allergies: 1)  ! Benadryl 2)  ! Promethazine Hcl (Promethazine Hcl) 3)  ! Talwin 4)  ! Duradryl 5)  ! Quinine 6)  ! Asa 7)  ! Codeine 8)  ! Hydrocodone 9)  ! * Trimox 10)  ! Indocin  Review of Systems      See HPI  Physical Exam  General:  Well-developed,well-nourished,in no acute distress; alert,appropriate and cooperative throughout examination Head:  Normocephalic and atraumatic without obvious abnormalities. No apparent alopecia or balding. Eyes:  No corneal or conjunctival inflammation noted. EOMI. Perrla. Funduscopic exam benign, without hemorrhages, exudates or papilledema. Vision grossly normal. Ears:  External ear exam shows no significant lesions or deformities.  Otoscopic examination reveals clear canals, tympanic membranes are intact bilaterally without bulging, retraction, inflammation or discharge. Hearing is grossly normal bilaterally. Nose:  External nasal examination shows no deformity or inflammation. Nasal mucosa are pink and moist without lesions or exudates. Mouth:  Oral mucosa and oropharynx without lesions or exudates.  Teeth in good repair. Neck:  No deformities, masses, or tenderness  noted. Chest Wall:  No deformities, masses, or tenderness noted. Lungs:  symmetrical breath sounds, late expiratory wheezing   Impression & Recommendations:  Problem # 1:  ASTHMA (ICD-493.90) Assessment Improved  Her updated medication list for this problem includes:    Prednisone 20 Mg Tabs (Prednisone) ..... Uad  Complete Medication List: 1)  Synthroid 88 Mcg Tabs (Levothyroxine sodium) .... Take 1 tablet by mouth once a day 2)  Flexeril 10 Mg Tabs (Cyclobenzaprine hcl) .... Take 1 tablet by mouth at bedtime 3)  Mirapex 1 Mg Tabs (Pramipexole dihydrochloride) .... Take 1 tablet by mouth at bedtime 4)  Tenoretic 50 50-25 Mg Tabs (Atenolol-chlorthalidone) .... One by mouth daily 5)  Sertraline Hcl 100 Mg Tabs (Sertraline hcl) .Marland Kitchen.. 1 tab @ bedtime 6)  Darvocet-n 100 100-650 Mg Tabs (Propoxyphene n-apap) 7)  Calcium 600 Mg Tabs (Calcium) .... Take one tablet daily 8)  Vitamin D  9)  Eq Fiber Therapy 0.52 Gm Caps (Psyllium) .... Once daily 10)  Daily-vitamin Tabs (Multiple vitamin) .... Once daily 11)  Derma Patch  .... Use for vertigo 12)  Adult Aspirin Ec Low Strength 81 Mg Tbec (Aspirin) .... Once daily 13)  Dermotic 0.01 % Oil (Fluocinolone acetonide) .... Once daily 14)  Pred Forte 1 % Susp (Prednisolone acetate) .... Once daily 15)  Premarin 0.625 Mg/gm Crea (Estrogens, conjugated) .... Apply 2 x week 16)  Transderm-scop 1.5 Mg Pt72 (Scopolamine base) .... One patch q 3 days as needed 17)  Hydrochlorothiazide 25 Mg Tabs (Hydrochlorothiazide) .... Take 1 tablet by mouth every morning 18)  Prednisone 20 Mg Tabs (  Prednisone) .... Uad 19)  Doxycycline Hyclate 100 Mg Caps (Doxycycline hyclate) .... Take 1 tablet by mouth two times a day 20)  Hydrocodone-homatropine 5-1.5 Mg/66ml Syrp (Hydrocodone-homatropine) .Marland Kitchen.. 1 or 2 tsps three times a day as needed  Patient Instructions: 1)  begin to taper the prednisone by taking one tablet x 3 days, then half a tablet x 3 days, then half a  tablet every other day for two week taper. 2)  Decrease E. Hydromet two one half or 1 teaspoon 3 times a day. 3)  If this is not resolve the nausea stopped the doxycycline 4)  Please schedule a follow-up appointment as needed.

## 2010-09-01 NOTE — H&P (Signed)
[name], [name] NO.:  [id number]   MEDICAL RECORD NO.:  [id number]          PATIENT TYPE:  EMS   LOCATION:  [facility]                         FACILITY:  [facility]   PHYSICIAN:  Eugenio Hoes. Tawanna Cooler, M.D. [facility] [place] OF BIRTH:  14-Jun-1950   DATE OF ADMISSION:  01/08/2005  DATE OF DISCHARGE:                                HISTORY & PHYSICAL   CHIEF COMPLAINT:  Abdominal pain.   HISTORY OF PRESENT ILLNESS:  [name] is a 60 year old female who  began experiencing suprapubic and right lower quadrant pain early this  morning around 7 a.m.  The pain was associated with nausea.  She promptly  saw her primary care physician, Tinnie Gens A. Tawanna Cooler, M.D. [facility] and he suspected  appendicitis and sent her to the [facility] emergency room.  We  have asked to consult on this patient.  Lab work and CT scan are pending.   ALLERGIES:  BENADRYL causes anaphylaxis, ERGOTAMINE and PROMETHAZINE create  a hot/cold intolerance.  ASPIRIN and CODEINE cause nausea and vomiting.  TALWIN has a history of syncope in this patient.  DURADRIN rash.  TRIMOX and  HYDROCODONE the patient states she has an allergy but is uncertain of what  that allergy is.   MEDICATIONS:  1.  Synthroid 88 mcg daily.  2.  TUMS daily.  3.  Multivitamin daily.  4.  Flexeril 10 mg p.o. nightly.  5.  Zoloft 100 mg a day.  6.  Mirapex one p.o. nightly.  7.  Tenoretic 50/25 one p.o. daily.  8.  Prozac 40 mg a day.   PAST MEDICAL HISTORY:  Hypothyroidism, treated.  Status post total abdominal  hysterectomy, laparoscopic, no oophorectomy at that time.  Hypertension,  migraines, restless leg syndrome.  She has multiple medication intolerances  as above.   FAMILY HISTORY:  Emphysema in both parents who are deceased.   REVIEW OF SYSTEMS:  As above.  Otherwise negative.   PHYSICAL EXAMINATION:  VITAL SIGNS:  Temperature 98.8, pulse 92, blood  pressure 115/77, respirations 20.  HEENT:  Grossly normal.  No carotid  bruits, no JVD or thyromegaly. Sclerae  clear, conjunctivae normal, nares without drainage.  HEART:  Regular rate and rhythm, no murmur, rub, or ectopy.  CHEST:  Clear to auscultation bilaterally.  ABDOMEN:  Diffusely tender.  She has guarding.  She does have rebound and no  rigidity.  She has tenderness in the suprapubic and right lower quadrant  region.  EXTREMITIES:  No peripheral edema.  Skin warm and dry.  In general, she  appears to be in mild distress.   CT scan and laboratory studies are pending.   IMPRESSION:  Right lower quadrant abdominal pain suspicious for  appendicitis.   PLAN:  IV fluids, IV antibiotics, IV pain medications, and we will await CT  scan.      Guy Franco, P.A.    ______________________________  Eugenio Hoes Tawanna Cooler, M.D. [facility]    LB/MEDQ  D:  01/08/2005  T:  01/09/2005  Job:  [id number]   cc:   Carrington Clamp, M.D.  Fax: [phone]   Currie Paris, M.D.  [address], [address]  [place]  [place] [address]

## 2010-09-01 NOTE — Op Note (Signed)
NAMELAMEISHA, [name]                    ACCOUNT NO.:  [id number]   MEDICAL RECORD NO.:  [id number]                   PATIENT TYPE:  INP   LOCATION:  9322                                 FACILITY:  [facility]   PHYSICIAN:  Carrington Clamp, M.D.              DATE OF BIRTH:  Jul 26, 1950   DATE OF PROCEDURE:  05/19/2002  DATE OF DISCHARGE:                                 OPERATIVE REPORT   PREOPERATIVE DIAGNOSES:  1. Postmenopausal bleeding.  2. Stress urinary incontinence.  3. Cystocele.   POSTOPERATIVE DIAGNOSES:  1. Postmenopausal bleeding.  2. Stress urinary incontinence.  3. Cystocele.   PROCEDURE:  1. Total vaginal hysterectomy.  2. Tension-free vaginal tape abdominal approach, Gynecare.  3. Cystoscopy.  4. Anterior repair.   ATTENDING:  Carrington Clamp, M.D.   ASSISTANT:  Luvenia Redden, M.D.   ANESTHESIA:  General endotracheal anesthesia.   ESTIMATED BLOOD LOSS:  450 mL.   IV FLUIDS:  2700 mL.   URINE OUTPUT:  Clear, not measured.   COMPLICATIONS:  None.   FINDINGS:  Small uterus.  Small, normal postmenopausal ovaries.  Cystocele  with -1 presentation.  Rectocele -2 presentation.   MEDICATIONS:  1. Pitressin.  2. Sterile milk.  3. Xylocaine.   PATHOLOGY:  Uterus and cervix.  Vaginal pack is in place.   TECHNIQUE:  After adequate general anesthesia was achieved patient was  prepped and draped in usual sterile fashion in dorsal lithotomy position.  The bladder was emptied with a red rubber catheter and 60 mL of sterile milk  was instilled.  Red rubber was withdrawn and the D__________ retractor was  placed in the vagina and the cervix grasped with a Leahey clamp.  The cervix  was then injected with approximately 10 mL of 20 and 100 Pitressin  circumferentially.  Circumferential incision was then made with the scalpel  at the level of the reflection of the vagina up to the cervix.   Posteriorly the cul-de-sac was entered into with the Mayo scissors and  the  long duck bull retractor placed.  The section of the vesicouterine fascia  and the bladder away from the cervix was begun with Metzenbaum scissors.  Heaney clamps were then used bilaterally to clamp the uterosacral ligaments.  Each pedicle was secured with a Heaney stitch of 0 Vicryl after being  incised with the Mayo scissors.  The dissection of the bladder off of the  cervix was then continued until the anterior peritoneum was entered into and  then retracted away.  The cardinal ligament was then divided with  alternating successives bites of the Heaney clamp.  Each pedicle was incised  with the Mayo scissors and secured with a stitch of 0 Vicryl.  This  continued all the way up through the broad ligament until finally Heaney  clamps could be placed bilaterally over the uterine ovarian ligament and  tube at the cornu of the uterus.  Each pedicle was incised  with the Mayo  scissors.  The pedicles were regrasped with Heaney's and trimmed.  The  ovaries were inspected at this point and found to be very small and very  high up in the pelvis.  Because it did not come down easily it was decided  to leave them, especially since they looked so normal.  Each uterine ovarian  ligament pedicle was then secured with a free hand tie of 0 Vicryl followed  by a stitch of 0 Vicryl.  Hemostasis was achieved.  The peritoneum was then  closed with a running purse-string stitch of 2-0 Vicryl that incorporated  each uterosacral and in between a purse-string stitch modified Moskowitz of  the descending sigmoid.  This was closed down in a purse-string fashion,  thus closing the peritoneum.  The attention was then turned to the anterior  vaginal wall and the anterior vaginal cuff which was grasped with a pair of  Allis'.  The anterior vaginal wall mucosa was then reflected from the  underlying vesicovaginal fascia with sharp and blunt dissection of the  Metzenbaum scissors in the midline and incising the  mucosa in the midline.  Lateral dissection was then carried out with the aid of the Metzenbaum's.  Two mattress stitches of 0 Vicryl were placed laterally in the vesicouterine  fascia, thus closing the vesicouterine fascia.  There was a small amount of  bleeding and Bovie cautery and a small 3-0 Vicryl stitch was used in the  patient's left vesicovaginal fascia.   Attention was then turned to the abdomen where two stab incisions were made  3 cm off the midline just above the pubic symphysis.  Each incision was then  injected with 10 mL of 0.5% Xylocaine into the space of Retzius.  The  Gynecare abdominal _______  were then placed perpendicular to the patient's  rectus fascia and through the rectus fascia and then rocked forward  underneath the pubic symphysis to come out through the pelvic diaphragm  lateral to the urethra.  This was done bilaterally.  At this time cystoscopy  was performed and there were no needles noted in the bladder.  Inspection of  the entire bladder was undertaken including seeing the indigo carmine from  the ureteral orifices which were working fine.  The vaginal needles were  then attached to the abdominal needles and pushed back up through the space  through the abdominal wall and the sheath was removed on the tape while a  dilator was placed just on the urethra to ensure no tension.  The dilator  was removed.  The tape was checked and found to be in place without too much  tension.  The tapes were cut just underneath the skin.  Each of the stab  incisions were then closed with Dermabond.  Another stitch of 2-0 Vicryl was  used just lateral to the tape on the vaginal mucosa wall to ensure  hemostasis on the patient's right-hand side.   The cuff was closed with three figure-of-eight stitches.  The vaginal mucosa  was then trimmed and the vaginal mucosa closed in a running locked stitch of 2-0 Vicryl.  The rectocele was reinspected and found to be minor.  The   sphincter, although thinned anteriorly, was still intact and it was thought  at this time that additional operation would  narrow the vagina unnecessarily and that the patient would probably benefit  more from biofeedback management of gas incontinence than surgical  management.  The patient tolerated the procedure  well.  Was returned to  recovery room in stable condition.                                               Carrington Clamp, M.D.    MH/MEDQ  D:  05/19/2002  T:  05/19/2002  Job:  [id number]

## 2010-09-01 NOTE — Discharge Summary (Signed)
NAMELAVONNA, [name] NO.:  [id number]   MEDICAL RECORD NO.:  [id number]          PATIENT TYPE:  INP   LOCATION:  5731                         FACILITY:  [facility]   PHYSICIAN:  Guy Franco, P.A.       DATE OF BIRTH:  03/15/1951   DATE OF ADMISSION:  01/08/2005  DATE OF DISCHARGE:  01/09/2005                                 DISCHARGE SUMMARY   DISCHARGE DIAGNOSES:  1.  Acute appendicitis, status post laparoscopic appendectomy on January 08, 2005, by Dr. Jamey Ripa.  2.  Hypothyroidism, treated.  3.  Restless leg syndrome.  4.  Hypertension.  5.  Migraine headaches.  6.  Status post total abdominal hysterectomy.  7.  Multiple medication intolerances.   HISTORY OF PRESENT ILLNESS:  [name] is a 60 year old female, who was  admitted on January 08, 2005, after acute onset of abdominal pain that  morning.  She was found to have acute appendicitis confirmed with CT scan  along with some mild leukocytosis.  She was taken emergently to the  operating room by Dr. Cicero Duck, and she underwent a laparoscopic  appendectomy.  She tolerated the procedure well and was taken to her room in  stable condition.  The following day, she was ready to go home, she was  ambulating well, urinating without difficulty, and eating without problem.  She is discharged to home in stable condition.   She is discharged to home on her current medications, which include  Synthroid, Tums, multivitamins, Flexeril, Tylenol as needed, fiber therapy  capsules, Mirapex, Tenoretic, Prozac, and Zoloft.  She was given specific  instructions regarding laparoscopic appendectomy care of incisions.  She is  not to drive for two days, no lifting over 10 pounds for three weeks, she  may shower, she may walk up steps, she may return to work on October 9th  with no lifting over 10 pounds.  We will reevaluate her in the office on  February 01, 2005, at 2:20 p.m. and further discuss her lifting  restrictions  given that she does lift heavy objects at work.   I also recommended that she follow up with her gynecologist for her yearly  examination since she tells me that she has not returned since her surgery  two years ago.      Guy Franco, P.A.     LB/MEDQ  D:  01/09/2005  T:  01/09/2005  Job:  [id number]   cc:   Tinnie Gens A. Tawanna Cooler, M.D. [facility]  [address] [place]  [place] [address]   Carrington Clamp, M.D.  Fax: [phone]

## 2010-09-01 NOTE — Discharge Summary (Signed)
[name], [name]                    ACCOUNT NO.:  [id number]   MEDICAL RECORD NO.:  [id number]                   PATIENT TYPE:  INP   LOCATION:  9326                                 [facility]ACILITY[facility]   PHYSICIAN:  Carrington Clamp, M.D.              DATE OF BIRTH:  19-Jan-1951   DATE OF ADMISSION:  05/19/2002  DATE OF DISCHARGE:  05/21/2002                                 DISCHARGE SUMMARY   ADMITTING DIAGNOSES:  1. Postmenopausal bleeding.  2. Stress urinary incontinence.  3. Cystocele.   DISCHARGE DIAGNOSES:  1. Postmenopausal bleeding.  2. Stress urinary incontinence.  3. Cystocele.   PERTINENT PROCEDURES PERFORMED:  1. Total vaginal hysterectomy.  2. Tension-free vaginal tape Gynecare abdominal approach.  3. Cystoscopy.  4. Anterior repair.   CHIEF COMPLAINT:  This is a 60 year old G1, P1 with postmenopausal bleeding  not responding to oral contraceptives, stress urinary incontinence,  cystocele, mild rectocele, and cramping.  She has no history of rectal  splinting, but does have occasional gas incontinence.   HISTORY OF PRESENT ILLNESS:  The patient presented with the above symptoms  and did not respond to conservative treatment.  The patient desired  definitive treatment.  Ultrasound showed a uterus of 8 x 4 cm with a small  fibroid.  H&H had been stable preoperatively.   PAST MEDICAL HISTORY:  Chronic leg pain and hypothyroid goiter.   PAST SURGICAL HISTORY:  Bilateral tubal ligation.   PAST GYNECOLOGIC HISTORY:  Negative for sexually transmitted diseases or  abnormal Pap smears.   PAST OBSTETRICAL HISTORY:  TSVD x1.   ALLERGIES:  CODEINE, BENADRYL, TRIMOX.   MEDICATIONS:  1. Prozac 20 mg.  2. Flexeril 10 mg q.h.s.  3. Synthroid 0.088 mcg.   PHYSICAL EXAMINATION:  VITAL SIGNS:  Blood pressure 148/92.  GENERAL:  Well.  HEENT:  Anicteric.  No edema.  NECK:  Without lymphadenopathy.  LUNGS:  Clear to auscultation bilaterally.  HEART:  Regular  rate and rhythm.  BREASTS:  No masses.  ABDOMEN:  Normal.  ADENOPATHY:  None.  PELVIC:  Genitalia showed slightly atrophic external genitalia and vaginal  cystocele to the -1 presentation above the hymenal ring.  Q-tip test was  much greater than 45 degrees.  There was a small rectocele to the -2  presentation.  Cervix was normal.  Uterus was normal size, shape, and  anteverted with some descensus.  RECTAL:  Stool was heme-negative.  EXTREMITIES:  Benign.  SKIN:  Clear.   SPECIALTY ASSESSMENT:  The patient underwent cystometrics in the office and  was found to have pure stress urinary incontinence with leak point pressure  at 77.   ASSESSMENT:  This is a 60 year old woman who desired definitive therapy for  her stress urinary incontinence and postmenopausal bleeding.  She was for  total vaginal hysterectomy, possible bilateral salpingo-oophorectomy,  Gynecare tension-free vaginal tape, and possible anterior and posterior  repair.  The patient was to receive preoperative  antibiotics and SCDs in  surgery.   HOSPITAL COURSE:  The patient underwent the above named procedures without  complication on June 17, 2002.  Postoperative day number one and two she was  doing well.  Postoperative day number two she was able to void 200 mL with a  postvoid residual of 75 mL and therefore was sent home without the catheter.  She was discharged afebrile on postoperative day number two with the  following.   DISCHARGE MEDICATIONS:  Percocet 5 mg one p.o. q.4-6h. p.r.n. pain.   ACTIVITY:  No heavy lifting x6 weeks.  No straining x6 weeks.  Pelvic rest  x6 weeks.   DIET:  High fiber.  High water.   WOUND CARE:  No bath.   FOLLOW UP:  Two weeks.                                               Carrington Clamp, M.D.    MH/MEDQ  D:  06/18/2002  T:  06/18/2002  Job:  [id number]   cc:   Tinnie Gens A. Tawanna Cooler, M.D. [facility]

## 2010-09-01 NOTE — Op Note (Signed)
[name], URY NO.:  [id number]   MEDICAL RECORD NO.:  [id number]          PATIENT TYPE:  INP   LOCATION:  1825                         FACILITY:  [facility]   PHYSICIAN:  Currie Paris, M.D.DATE OF BIRTH:  Sep 01, 1950   DATE OF PROCEDURE:  01/08/2005  DATE OF DISCHARGE:                                 OPERATIVE REPORT   PREOPERATIVE DIAGNOSIS:  Acute appendicitis.   POSTOPERATIVE DIAGNOSIS:  Acute appendicitis.   OPERATION:  Laparoscopic appendectomy.   SURGEON:  Currie Paris, M.D.   ANESTHESIA:  General.   CLINICAL HISTORY:  This is a 60 year old with abdominal pain which is  localized to the right lower quadrant.  It had only been present about 6  hours at the time of initial visit and it started in the suprapubic area.  Nevertheless, her physical was consistent with appendicitis.   Laboratory studies showed a white count of 12,000 and CT confirmed what  looked like appendicitis.   DESCRIPTION OF PROCEDURE:  The patient was seen in the holding area and had  no further questions with plans for appendectomy.  She was taken to the  operating room and after satisfactory general anesthesia had been obtained,  a Foley catheter was placed and the abdomen prepped and draped.  A time-out  occurred.   I used 0.25% plain Marcaine for each incision.  The umbilical incision was  made, the fascia opened and the peritoneal cavity entered under direct  vision.  At 10/11 cannula was placed and the abdomen insufflated to 15.   Under direct vision, a 5-mm trocar was placed in the right upper quadrant  and a 10/11 in the left lower quadrant.   There was a row of very thin adhesions to the lower midline from a prior  laparoscopic-assisted hysterectomy.  These were taken down with the harmonic  scalpel.   Once those were down, I saw some small bowel loop stuck up over the appendix  and this was gently manipulated off and I could grasp the tip of the  appendix.  With that done, I could free it up and see all way down to the  junction with the cecum.  There was exudate and inflammatory changes  present, but there was no perforation.   The mesoappendix was quite thickened, but I was able to divide this nicely  with the harmonic scalpel down to the base the appendix.  We checked to made  sure everything was dry and then put the Endo GIA in and divided the  appendix.  It was placed in an EndoCatch bag and brought out the umbilical  port.   I reinsufflated, irrigated and again checked for hemostasis, made sure  everything was dry, and looked at my staple line to make sure it looked  intact.  The trocar sites were not bleeding and the place where the  adhesions were taken down appeared also dry.   This having been done, I removed the 5-mm trocar first, the 10/11 in left  lower quadrant next, checked both sites for bleeding and they were dry.  The  umbilical site port was removed  and the abdomen deflated.  The pursestring  was tied down.  Skin was closed with 4-0 Monocryl subcuticular and  Dermabond.   The patient tolerated the procedure well.  There were no operative  complications and all counts were correct.      Currie Paris, M.D.  Electronically Signed     CJS/MEDQ  D:  01/08/2005  T:  01/09/2005  Job:  [id number]   cc:   Tinnie Gens A. Tawanna Cooler, M.D. [facility]  [address] [place]  [place] [address]

## 2010-09-12 ENCOUNTER — Other Ambulatory Visit: Payer: Self-pay | Admitting: Family Medicine

## 2010-09-12 DIAGNOSIS — Z1231 Encounter for screening mammogram for malignant neoplasm of breast: Secondary | ICD-10-CM

## 2010-10-03 ENCOUNTER — Other Ambulatory Visit (INDEPENDENT_AMBULATORY_CARE_PROVIDER_SITE_OTHER): Payer: 59

## 2010-10-03 DIAGNOSIS — Z0000 Encounter for general adult medical examination without abnormal findings: Secondary | ICD-10-CM

## 2010-10-03 LAB — CBC WITH DIFFERENTIAL/PLATELET
Basophils Absolute: 0 10*3/uL (ref 0.0–0.1)
Eosinophils Relative: 1.3 % (ref 0.0–5.0)
Lymphocytes Relative: 29.7 % (ref 12.0–46.0)
Monocytes Relative: 7.7 % (ref 3.0–12.0)
Neutrophils Relative %: 60.9 % (ref 43.0–77.0)
Platelets: 220 10*3/uL (ref 150.0–400.0)
RDW: 14.1 % (ref 11.5–14.6)
WBC: 7.3 10*3/uL (ref 4.5–10.5)

## 2010-10-03 LAB — POCT URINALYSIS DIPSTICK
Leukocytes, UA: NEGATIVE
Protein, UA: NEGATIVE
Urobilinogen, UA: 0.2
pH, UA: 5.5

## 2010-10-03 LAB — LIPID PANEL
HDL: 49 mg/dL (ref >39.00)
LDL Cholesterol: 98 mg/dL (ref 0–99)
Total CHOL/HDL Ratio: 3
VLDL: 22.6 mg/dL (ref 0.0–40.0)

## 2010-10-03 LAB — BASIC METABOLIC PANEL
BUN: 18 mg/dL (ref 6–23)
Calcium: 9.4 mg/dL (ref 8.4–10.5)
GFR: 92.78 mL/min (ref >60.00)
Glucose, Bld: 115 mg/dL — ABNORMAL HIGH (ref 70–99)

## 2010-10-03 LAB — HEPATIC FUNCTION PANEL
AST: 23 U/L (ref 0–37)
Alkaline Phosphatase: 77 U/L (ref 39–117)
Bilirubin, Direct: 0.2 mg/dL (ref 0.0–0.3)
Total Bilirubin: 1.2 mg/dL (ref 0.3–1.2)

## 2010-10-03 LAB — HEMOGLOBIN A1C: Hgb A1c MFr Bld: 6.6 % — ABNORMAL HIGH (ref 4.6–6.5)

## 2010-10-03 LAB — MICROALBUMIN / CREATININE URINE RATIO: Microalb Creat Ratio: 0.5 mg/g (ref 0.0–30.0)

## 2010-10-09 ENCOUNTER — Encounter: Payer: Self-pay | Admitting: Family Medicine

## 2010-10-10 ENCOUNTER — Encounter: Payer: Self-pay | Admitting: Family Medicine

## 2010-10-10 ENCOUNTER — Ambulatory Visit (INDEPENDENT_AMBULATORY_CARE_PROVIDER_SITE_OTHER): Payer: 59 | Admitting: Family Medicine

## 2010-10-10 ENCOUNTER — Ambulatory Visit
Admission: RE | Admit: 2010-10-10 | Discharge: 2010-10-10 | Disposition: A | Discharge status: Home / Self Care | Payer: 59 | Source: Ambulatory Visit | Attending: Family Medicine | Admitting: Family Medicine

## 2010-10-10 DIAGNOSIS — N952 Postmenopausal atrophic vaginitis: Secondary | ICD-10-CM

## 2010-10-10 DIAGNOSIS — IMO0002 Reserved for concepts with insufficient information to code with codable children: Secondary | ICD-10-CM

## 2010-10-10 DIAGNOSIS — E039 Hypothyroidism, unspecified: Secondary | ICD-10-CM

## 2010-10-10 DIAGNOSIS — Z1231 Encounter for screening mammogram for malignant neoplasm of breast: Secondary | ICD-10-CM

## 2010-10-10 DIAGNOSIS — E119 Type 2 diabetes mellitus without complications: Secondary | ICD-10-CM

## 2010-10-10 MED ORDER — ESTROGENS, CONJUGATED 0.625 MG/GM VA CREA: TOPICAL_CREAM | VAGINAL | Status: DC | PRN

## 2010-10-10 MED ORDER — PRAMIPEXOLE DIHYDROCHLORIDE 1 MG PO TABS: 1.0000 mg | ORAL_TABLET | Freq: Every day | ORAL | Status: DC

## 2010-10-10 MED ORDER — LEVOTHYROXINE SODIUM 88 MCG PO TABS: 88.0000 ug | ORAL_TABLET | Freq: Every day | ORAL | Status: DC

## 2010-10-10 MED ORDER — ATENOLOL-CHLORTHALIDONE 50-25 MG PO TABS: 1.0000 | ORAL_TABLET | Freq: Every day | ORAL | Status: DC

## 2010-10-10 NOTE — Patient Instructions (Signed)
Continue your current medications.  Decrease sheer carbohydrate intake, walk 20 minutes daily.  Follow-up blood sugar A1c, and office visit in 3 months.  Return in one year for your annual exam sooner if any problems

## 2010-10-10 NOTE — Progress Notes (Signed)
  Subjective:    Patient ID: [name], female    DOB: 1950-10-17, 60 y.o.   MRN: [id number]  HPI [name] is a 60 year old female, nonsmoker, who works at [employer] full-time and comes in today for annual physical examination because of a history of hypertension, hypothyroidism, restless leg syndrome, diabetes, type II controlled with diet.  Her blood pressures, treated with Tenoretic 50 -- 25 daily.  BP 140/80.  She takes Premarin vaginal cream twice weekly for vaginal dryness.  She takes Synthroid 88 mcg daily for hypothyroidism.  She takes Mirapex 1 mg nightly for restless leg syndrome.  She also takes an 81-mg baby aspirin, calcium, and vitamin D.  She gets routine eye care, dental care, BSE monthly, and a mammography, colonoscopy, normal, tetanus, 2007.  Review of Systems  Constitutional: Negative.   HENT: Negative.   Eyes: Negative.   Respiratory: Negative.   Cardiovascular: Negative.   Gastrointestinal: Negative.   Genitourinary: Negative.   Musculoskeletal: Negative.   Neurological: Negative.   Hematological: Negative.   Psychiatric/Behavioral: Negative.        Objective:   Physical Exam  Constitutional: She appears well-developed and well-nourished.  HENT:  Head: Normocephalic and atraumatic.  Right Ear: External ear normal.  Left Ear: External ear normal.  Nose: Nose normal.  Mouth/Throat: Oropharynx is clear and moist.  Eyes: EOM are normal. Pupils are equal, round, and reactive to light.  Neck: Normal range of motion. Neck supple. No thyromegaly present.  Cardiovascular: Normal rate, regular rhythm, normal heart sounds and intact distal pulses.  Exam reveals no gallop and no friction rub.   No murmur heard. Pulmonary/Chest: Effort normal and breath sounds normal.  Abdominal: Soft. Bowel sounds are normal. She exhibits no distension and no mass. There is no tenderness. There is no rebound.  Genitourinary: Vagina normal. Guaiac negative stool. No  vaginal discharge found.       Bilateral breast exam normal  Musculoskeletal: Normal range of motion.  Lymphadenopathy:    She has no cervical adenopathy.  Neurological: She is alert. She has normal reflexes. No cranial nerve deficit. She exhibits normal muscle tone. Coordination normal.  Skin: Skin is warm and dry.  Psychiatric: She has a normal mood and affect. Her behavior is normal. Judgment and thought content normal.          Assessment & Plan:  Healthy female.  Hypertension.  Continue Tenoretic.  Postmenopausal vaginal dryness.  Continue Premarin vaginal cream twice weekly.  Hypothyroidism.  Continue Synthroid 88 mcg daily.  Restless leg syndrome continue Mirapex 1 mg daily.  Elevated blood sugar diet, exercise, weight loss, and A1c, and blood sugar in 3 months

## 2010-10-16 ENCOUNTER — Other Ambulatory Visit: Payer: Self-pay | Admitting: Family Medicine

## 2010-11-07 ENCOUNTER — Other Ambulatory Visit: Payer: Self-pay | Admitting: Family Medicine

## 2011-01-02 ENCOUNTER — Other Ambulatory Visit: Payer: 59

## 2011-01-09 ENCOUNTER — Ambulatory Visit: Payer: 59 | Admitting: Family Medicine

## 2011-05-10 ENCOUNTER — Other Ambulatory Visit: Payer: Self-pay | Admitting: Family Medicine

## 2011-05-10 MED ORDER — SCOPOLAMINE 1 MG/3DAYS TD PT72: 1.0000 | MEDICATED_PATCH | TRANSDERMAL | Status: AC

## 2011-05-10 NOTE — Telephone Encounter (Signed)
Pt is going on 6 days cruise in march requesting sea sickness patches. [facility]

## 2011-05-10 NOTE — Telephone Encounter (Signed)
R,,,,,,,,,,,,,, please call the patient in the Transderm scope patches.  Number two,,,,,,,,, one refill,,,,,,,,,,, and tell her EEG patch last 3 days and to put the first patch on 8 hours prior to departure

## 2011-05-10 NOTE — Telephone Encounter (Signed)
Left message on machine for patient  And rx sent 

## 2011-08-06 ENCOUNTER — Other Ambulatory Visit: Payer: 59

## 2011-08-09 ENCOUNTER — Other Ambulatory Visit: Payer: Self-pay | Admitting: Family Medicine

## 2011-08-13 ENCOUNTER — Encounter: Payer: 59 | Admitting: Family Medicine

## 2011-10-08 ENCOUNTER — Other Ambulatory Visit: Payer: Self-pay | Admitting: Family Medicine

## 2011-10-08 ENCOUNTER — Other Ambulatory Visit (INDEPENDENT_AMBULATORY_CARE_PROVIDER_SITE_OTHER): Payer: 59

## 2011-10-08 ENCOUNTER — Other Ambulatory Visit: Payer: 59

## 2011-10-08 DIAGNOSIS — Z0000 Encounter for general adult medical examination without abnormal findings: Secondary | ICD-10-CM

## 2011-10-08 DIAGNOSIS — Z1231 Encounter for screening mammogram for malignant neoplasm of breast: Secondary | ICD-10-CM

## 2011-10-08 LAB — CBC WITH DIFFERENTIAL/PLATELET
Basophils Relative: 0.4 % (ref 0.0–3.0)
Eosinophils Relative: 2 % (ref 0.0–5.0)
HCT: 41 % (ref 36.0–46.0)
Lymphs Abs: 1.7 10*3/uL (ref 0.7–4.0)
MCV: 93.4 fl (ref 78.0–100.0)
Monocytes Absolute: 0.3 10*3/uL (ref 0.1–1.0)
Monocytes Relative: 5.8 % (ref 3.0–12.0)
RBC: 4.39 Mil/uL (ref 3.87–5.11)
WBC: 5.4 10*3/uL (ref 4.5–10.5)

## 2011-10-08 LAB — HEPATIC FUNCTION PANEL
ALT: 17 U/L (ref 0–35)
AST: 19 U/L (ref 0–37)
Albumin: 3.4 g/dL — ABNORMAL LOW (ref 3.5–5.2)
Alkaline Phosphatase: 63 U/L (ref 39–117)
Total Bilirubin: 0.6 mg/dL (ref 0.3–1.2)

## 2011-10-08 LAB — POCT URINALYSIS DIPSTICK
Ketones, UA: NEGATIVE
Nitrite, UA: NEGATIVE
Protein, UA: NEGATIVE
pH, UA: 7

## 2011-10-08 LAB — BASIC METABOLIC PANEL
Chloride: 103 mEq/L (ref 96–112)
GFR: 86.64 mL/min (ref >60.00)
Potassium: 3.6 mEq/L (ref 3.5–5.1)
Sodium: 141 mEq/L (ref 135–145)

## 2011-10-08 LAB — LIPID PANEL
Cholesterol: 148 mg/dL (ref 0–200)
LDL Cholesterol: 65 mg/dL (ref 0–99)
VLDL: 35.6 mg/dL (ref 0.0–40.0)

## 2011-10-08 LAB — MICROALBUMIN / CREATININE URINE RATIO: Microalb, Ur: 0.6 mg/dL (ref 0.0–1.9)

## 2011-10-08 LAB — TSH: TSH: 2.11 u[IU]/mL (ref 0.35–5.50)

## 2011-10-15 ENCOUNTER — Encounter: Payer: 59 | Admitting: Family Medicine

## 2011-10-23 ENCOUNTER — Ambulatory Visit
Admission: RE | Admit: 2011-10-23 | Discharge: 2011-10-23 | Disposition: A | Discharge status: Home / Self Care | Payer: 59 | Source: Ambulatory Visit | Attending: Family Medicine | Admitting: Family Medicine

## 2011-10-23 ENCOUNTER — Encounter: Payer: Self-pay | Admitting: Family Medicine

## 2011-10-23 ENCOUNTER — Ambulatory Visit (INDEPENDENT_AMBULATORY_CARE_PROVIDER_SITE_OTHER): Payer: 59 | Admitting: Family Medicine

## 2011-10-23 VITALS — BP 140/90 | Temp 98.5°F | Ht 64.0 in | Wt 184.0 lb

## 2011-10-23 DIAGNOSIS — Z1231 Encounter for screening mammogram for malignant neoplasm of breast: Secondary | ICD-10-CM

## 2011-10-23 DIAGNOSIS — IMO0002 Reserved for concepts with insufficient information to code with codable children: Secondary | ICD-10-CM

## 2011-10-23 DIAGNOSIS — N952 Postmenopausal atrophic vaginitis: Secondary | ICD-10-CM

## 2011-10-23 DIAGNOSIS — R51 Headache: Secondary | ICD-10-CM

## 2011-10-23 DIAGNOSIS — M25569 Pain in unspecified knee: Secondary | ICD-10-CM

## 2011-10-23 DIAGNOSIS — Z0000 Encounter for general adult medical examination without abnormal findings: Secondary | ICD-10-CM

## 2011-10-23 DIAGNOSIS — M25552 Pain in left hip: Secondary | ICD-10-CM | POA: Insufficient documentation

## 2011-10-23 DIAGNOSIS — E119 Type 2 diabetes mellitus without complications: Secondary | ICD-10-CM

## 2011-10-23 DIAGNOSIS — M25561 Pain in right knee: Secondary | ICD-10-CM

## 2011-10-23 DIAGNOSIS — M25551 Pain in right hip: Secondary | ICD-10-CM

## 2011-10-23 DIAGNOSIS — I10 Essential (primary) hypertension: Secondary | ICD-10-CM

## 2011-10-23 DIAGNOSIS — R609 Edema, unspecified: Secondary | ICD-10-CM

## 2011-10-23 DIAGNOSIS — M25562 Pain in left knee: Secondary | ICD-10-CM | POA: Insufficient documentation

## 2011-10-23 DIAGNOSIS — R34 Anuria and oliguria: Secondary | ICD-10-CM

## 2011-10-23 DIAGNOSIS — M25559 Pain in unspecified hip: Secondary | ICD-10-CM

## 2011-10-23 DIAGNOSIS — E039 Hypothyroidism, unspecified: Secondary | ICD-10-CM

## 2011-10-23 DIAGNOSIS — G2581 Restless legs syndrome: Secondary | ICD-10-CM

## 2011-10-23 MED ORDER — ESTROGENS, CONJUGATED 0.625 MG/GM VA CREA: TOPICAL_CREAM | VAGINAL | Status: DC | PRN

## 2011-10-23 MED ORDER — LEVOTHYROXINE SODIUM 88 MCG PO TABS: 88.0000 ug | ORAL_TABLET | Freq: Every day | ORAL | Status: DC

## 2011-10-23 MED ORDER — PRAMIPEXOLE DIHYDROCHLORIDE 1 MG PO TABS: 1.0000 mg | ORAL_TABLET | ORAL | Status: DC

## 2011-10-23 MED ORDER — DIAZEPAM 2 MG PO TABS
ORAL_TABLET | ORAL | Status: DC
Start: 2011-10-23 — End: 2013-02-18

## 2011-10-23 MED ORDER — ATENOLOL-CHLORTHALIDONE 50-25 MG PO TABS: 1.0000 | ORAL_TABLET | Freq: Every day | ORAL | Status: DC

## 2011-10-23 NOTE — Progress Notes (Signed)
  Subjective:    Patient ID: [name], female    DOB: August 02, 1950, 61 y.o.   MRN: [id number]  HPI [name] is a delightful 61 year old female nonsmoker who comes in today for general physical examination because of multiple issues  She has a history of hypertension well controlled with Tenoretic, postmenopausal vaginal dryness for which she uses Premarin cream sparingly, bilateral hip and knee pain that's getting worse over the last couple years unresponsive to of CC Motrin, hypothyroidism on Synthroid 88 mcg daily, restless leg syndrome on Mirapex 1 mg each bedtime  Her concerns this year the increasing hip and knee pain. She also has some tingling sensation in her right great toe. She's had 3 episodes of urinary incontinence at bedtime however during the day when she coughs or sneezes she does not lose her urine. Tetanus 2007.  She had a hysterectomy for nonmalignant reasons is age 38. Her ovaries were not removed. She says that surgery they could not find them.    Review of Systems  Constitutional: Negative.   HENT: Negative.   Eyes: Negative.   Respiratory: Negative.   Cardiovascular: Negative.   Gastrointestinal: Negative.   Genitourinary: Positive for enuresis.  Musculoskeletal: Positive for gait problem.  Neurological: Negative.   Hematological: Negative.   Psychiatric/Behavioral: Negative.        Objective:   Physical Exam  Constitutional: She appears well-developed and well-nourished.  HENT:  Head: Normocephalic and atraumatic.  Right Ear: External ear normal.  Left Ear: External ear normal.  Nose: Nose normal.  Mouth/Throat: Oropharynx is clear and moist.  Eyes: EOM are normal. Pupils are equal, round, and reactive to light.  Neck: Normal range of motion. Neck supple. No thyromegaly present.  Cardiovascular: Normal rate, regular rhythm, normal heart sounds and intact distal pulses.  Exam reveals no gallop and no friction rub.   No murmur  heard. Pulmonary/Chest: Effort normal and breath sounds normal.  Abdominal: Soft. Bowel sounds are normal. She exhibits no distension and no mass. There is no tenderness. There is no rebound.  Genitourinary: Vagina normal. Guaiac negative stool. No vaginal discharge found.       Bimanual exam normal  Musculoskeletal: Normal range of motion.       Decreased range of motion right and left hip right more restricted than the left. Some edema of both knees ligaments and cartilage appear to be intact.  Lymphadenopathy:    She has no cervical adenopathy.  Neurological: She is alert. She has normal reflexes. No cranial nerve deficit. She exhibits normal muscle tone. Coordination normal.  Skin: Skin is warm and dry.  Psychiatric: She has a normal mood and affect. Her behavior is normal. Judgment and thought content normal.          Assessment & Plan:  Healthy female  Hypertension continue Tenoretic  Hypothyroidism continue Synthroid  Restless leg syndrome continue Mirapex  Increasing right and left hip and knee pain referred or Sofer [name]  Anuresis x3 reassured urologic consult when necessary  Diabetes type 2 controlled with diet and exercise

## 2011-10-23 NOTE — Patient Instructions (Addendum)
Continue current medications  I would first get an orthopedic consult Dr. Cleophas Dunker or Dr. Cleophas Dunker  If the urologic symptoms persist I would recommend a consult with Dr. Gaspar Garbe at the urology Center  Return in one year sooner if any problems

## 2011-11-03 ENCOUNTER — Other Ambulatory Visit: Payer: Self-pay | Admitting: Family Medicine

## 2011-11-16 ENCOUNTER — Other Ambulatory Visit: Payer: Self-pay | Admitting: Orthopaedic Surgery

## 2011-11-16 DIAGNOSIS — M549 Dorsalgia, unspecified: Secondary | ICD-10-CM

## 2011-11-19 ENCOUNTER — Ambulatory Visit
Admission: RE | Admit: 2011-11-19 | Discharge: 2011-11-19 | Disposition: A | Discharge status: Home / Self Care | Payer: 59 | Source: Ambulatory Visit | Attending: Orthopaedic Surgery | Admitting: Orthopaedic Surgery

## 2011-11-19 DIAGNOSIS — M549 Dorsalgia, unspecified: Secondary | ICD-10-CM

## 2012-11-04 ENCOUNTER — Other Ambulatory Visit: Payer: 59

## 2012-11-06 ENCOUNTER — Other Ambulatory Visit: Payer: 59

## 2012-11-11 ENCOUNTER — Encounter: Payer: 59 | Admitting: Family Medicine

## 2012-11-12 ENCOUNTER — Other Ambulatory Visit: Payer: Self-pay | Admitting: Family Medicine

## 2012-12-29 ENCOUNTER — Other Ambulatory Visit: Payer: 59

## 2013-01-05 ENCOUNTER — Encounter: Payer: 59 | Admitting: Family Medicine

## 2013-02-11 ENCOUNTER — Other Ambulatory Visit (INDEPENDENT_AMBULATORY_CARE_PROVIDER_SITE_OTHER): Payer: 59

## 2013-02-11 DIAGNOSIS — Z0000 Encounter for general adult medical examination without abnormal findings: Secondary | ICD-10-CM

## 2013-02-11 LAB — BASIC METABOLIC PANEL
CO2: 31 mEq/L (ref 19–32)
Chloride: 100 mEq/L (ref 96–112)
Sodium: 140 mEq/L (ref 135–145)

## 2013-02-11 LAB — CBC WITH DIFFERENTIAL/PLATELET
Basophils Relative: 0.4 % (ref 0.0–3.0)
Eosinophils Absolute: 0.1 10*3/uL (ref 0.0–0.7)
Hemoglobin: 14.1 g/dL (ref 12.0–15.0)
Lymphocytes Relative: 29 % (ref 12.0–46.0)
MCHC: 33.8 g/dL (ref 30.0–36.0)
MCV: 90.8 fl (ref 78.0–100.0)
Monocytes Absolute: 0.4 10*3/uL (ref 0.1–1.0)
Neutro Abs: 3.8 10*3/uL (ref 1.4–7.7)
RBC: 4.6 Mil/uL (ref 3.87–5.11)

## 2013-02-11 LAB — HEPATIC FUNCTION PANEL
Albumin: 3.8 g/dL (ref 3.5–5.2)
Alkaline Phosphatase: 73 U/L (ref 39–117)
Total Protein: 7.2 g/dL (ref 6.0–8.3)

## 2013-02-11 LAB — POCT URINALYSIS DIPSTICK
Blood, UA: NEGATIVE
Ketones, UA: NEGATIVE
Leukocytes, UA: NEGATIVE
Protein, UA: NEGATIVE
pH, UA: 5.5

## 2013-02-11 LAB — LIPID PANEL
Cholesterol: 169 mg/dL (ref 0–200)
HDL: 47.1 mg/dL (ref >39.00)
LDL Cholesterol: 91 mg/dL (ref 0–99)
Triglycerides: 154 mg/dL — ABNORMAL HIGH (ref 0.0–149.0)
VLDL: 30.8 mg/dL (ref 0.0–40.0)

## 2013-02-18 ENCOUNTER — Encounter: Payer: Self-pay | Admitting: Family Medicine

## 2013-02-18 ENCOUNTER — Ambulatory Visit (INDEPENDENT_AMBULATORY_CARE_PROVIDER_SITE_OTHER): Payer: 59 | Admitting: Family Medicine

## 2013-02-18 VITALS — BP 130/78 | Temp 98.0°F | Ht 63.25 in | Wt 183.0 lb

## 2013-02-18 DIAGNOSIS — R609 Edema, unspecified: Secondary | ICD-10-CM

## 2013-02-18 DIAGNOSIS — E039 Hypothyroidism, unspecified: Secondary | ICD-10-CM

## 2013-02-18 DIAGNOSIS — M25559 Pain in unspecified hip: Secondary | ICD-10-CM

## 2013-02-18 DIAGNOSIS — M25551 Pain in right hip: Secondary | ICD-10-CM

## 2013-02-18 DIAGNOSIS — E119 Type 2 diabetes mellitus without complications: Secondary | ICD-10-CM

## 2013-02-18 DIAGNOSIS — N952 Postmenopausal atrophic vaginitis: Secondary | ICD-10-CM

## 2013-02-18 DIAGNOSIS — IMO0002 Reserved for concepts with insufficient information to code with codable children: Secondary | ICD-10-CM

## 2013-02-18 MED ORDER — ESTROGENS, CONJUGATED 0.625 MG/GM VA CREA: TOPICAL_CREAM | VAGINAL | Status: DC | PRN

## 2013-02-18 MED ORDER — PRAMIPEXOLE DIHYDROCHLORIDE 1 MG PO TABS: ORAL_TABLET | ORAL | Status: DC

## 2013-02-18 MED ORDER — DIAZEPAM 2 MG PO TABS: ORAL_TABLET | ORAL | Status: DC

## 2013-02-18 MED ORDER — LEVOTHYROXINE SODIUM 88 MCG PO TABS: ORAL_TABLET | ORAL | Status: DC

## 2013-02-18 MED ORDER — ATENOLOL-CHLORTHALIDONE 50-25 MG PO TABS: ORAL_TABLET | ORAL | Status: DC

## 2013-02-18 NOTE — Patient Instructions (Signed)
Continue your current medications  Work hardening next 6 months to decrease your caloric intake  Followup in 6 months because of your elevated blood sugar  Nonfasting labs one week prior

## 2013-02-18 NOTE — Progress Notes (Signed)
  Subjective:    Patient ID: [name], female    DOB: 08-Jul-1950, 62 y.o.   MRN: [id number]  HPI Dayra is a 62 year old single female nonsmoker who works at [employer] who comes in today for annual physical examination because of a history of hypertension, postmenopausal vaginal dryness, restless leg syndrome, hypothyroidism,  Her med list reviewed Z. distention no changes except she's not taken the Flexeril and more periods she takes a 2 mg Valium if the restless leg syndrome is not controlled with the Mirapex.  6 she gets routine eye care, dental care, BSE monthly, was do her mammogram in July. Advised to call and get set up for that ASAP 6 because she's due. Colonoscopy and GI  For 2 weeks she had intermittent vomiting he did make a difference what she ate it to be supero-large female. Status now the vomiting is stopped and she feels fine.. She's had no fever nausea diarrhea change in bowel habits etc. The vomiting just resolve spontaneously and now she feels fine. We discussed various options and she is asymptomatic and the vomiting is stopped we will do nothing at this time if however it does recur I asked her to call GI immediately   Review of Systems  Constitutional: Negative.   HENT: Negative.   Eyes: Negative.   Respiratory: Negative.   Cardiovascular: Negative.   Gastrointestinal: Negative.   Endocrine: Negative.   Genitourinary: Negative.   Musculoskeletal: Negative.   Allergic/Immunologic: Negative.   Neurological: Negative.   Hematological: Negative.   Psychiatric/Behavioral: Negative.        Objective:   Physical Exam  Nursing note and vitals reviewed. Constitutional: She appears well-developed and well-nourished.  HENT:  Head: Normocephalic and atraumatic.  Right Ear: External ear normal.  Left Ear: External ear normal.  Nose: Nose normal.  Mouth/Throat: Oropharynx is clear and moist.  Eyes: EOM are normal. Pupils are equal, round, and reactive to light.   Neck: Normal range of motion. Neck supple. No thyromegaly present.  Cardiovascular: Normal rate, regular rhythm, normal heart sounds and intact distal pulses.  Exam reveals no gallop and no friction rub.   No murmur heard. No carotid or artery bruits  Pulmonary/Chest: Effort normal and breath sounds normal.  Abdominal: Soft. Bowel sounds are normal. She exhibits no distension and no mass. There is no tenderness. There is no rebound.  Genitourinary: Vagina normal. Guaiac negative stool. No vaginal discharge found.  Bilateral breast exam normal  Uterus was removed 10 years ago. At that time she was told her ovaries were not visible or palpable  Musculoskeletal: Normal range of motion.  Lymphadenopathy:    She has no cervical adenopathy.  Neurological: She is alert. She has normal reflexes. No cranial nerve deficit. She exhibits normal muscle tone. Coordination normal.  Skin: Skin is warm and dry.  Psychiatric: She has a normal mood and affect. Her behavior is normal. Judgment and thought content normal.          Assessment & Plan:  Healthy female  Hypertension continue Tenoretic  Postmenopausal vaginal dryness continue vaginal cream twice weekly  Restless leg syndrome continue Mirapex 2 mg of Valium each bedtime when necessary  Hypothyroidism continue Synthroid 88 mcg daily  Two-week episode of intermittent vomiting now resolved GI consult if symptoms recur

## 2013-03-23 ENCOUNTER — Telehealth: Payer: Self-pay | Admitting: Family Medicine

## 2013-03-23 NOTE — Telephone Encounter (Signed)
attempted to call patient at work and have her paged but no one came to the phone to answer the page.

## 2013-03-23 NOTE — Telephone Encounter (Signed)
Pt has vertigo and has had before. pls advise

## 2013-03-23 NOTE — Telephone Encounter (Signed)
Left message on machine for patient to return our call. Per Dr Tawanna Cooler patient should have bed rest for 12-24 hours.  OTC or Rx is not helpful at this time.

## 2013-03-23 NOTE — Telephone Encounter (Signed)
Pt returning call to Fleet Contras, she can be reached at her work number [phone]), [employer].  When calling please have her paged.

## 2013-03-24 NOTE — Telephone Encounter (Signed)
Left message on machine for patient to see how patient is doing

## 2013-03-26 NOTE — Telephone Encounter (Signed)
Left message on machine for patient to return our call 

## 2013-08-11 ENCOUNTER — Other Ambulatory Visit (INDEPENDENT_AMBULATORY_CARE_PROVIDER_SITE_OTHER): Payer: 59

## 2013-08-11 DIAGNOSIS — E119 Type 2 diabetes mellitus without complications: Secondary | ICD-10-CM

## 2013-08-11 LAB — HEMOGLOBIN A1C: HEMOGLOBIN A1C: 6.4 % (ref 4.6–6.5)

## 2013-08-11 LAB — BASIC METABOLIC PANEL
BUN: 18 mg/dL (ref 6–23)
CHLORIDE: 101 meq/L (ref 96–112)
CO2: 30 meq/L (ref 19–32)
Calcium: 9.2 mg/dL (ref 8.4–10.5)
Creatinine, Ser: 0.8 mg/dL (ref 0.4–1.2)
GFR: 77.47 mL/min (ref >60.00)
Glucose, Bld: 116 mg/dL — ABNORMAL HIGH (ref 70–99)
POTASSIUM: 3.4 meq/L — AB (ref 3.5–5.1)
Sodium: 138 mEq/L (ref 135–145)

## 2013-08-18 ENCOUNTER — Encounter: Payer: Self-pay | Admitting: Family Medicine

## 2013-08-18 ENCOUNTER — Ambulatory Visit (INDEPENDENT_AMBULATORY_CARE_PROVIDER_SITE_OTHER): Payer: 59 | Admitting: Family Medicine

## 2013-08-18 VITALS — BP 140/80 | Temp 98.4°F | Wt 184.0 lb

## 2013-08-18 DIAGNOSIS — E119 Type 2 diabetes mellitus without complications: Secondary | ICD-10-CM

## 2013-08-18 MED ORDER — SCOPOLAMINE 1 MG/3DAYS TD PT72: 1.0000 | MEDICATED_PATCH | TRANSDERMAL | Status: DC

## 2013-08-18 NOTE — Patient Instructions (Signed)
Continue current medications  Continue walking daily  Followup in 6 months for your annual physical examination  Labs one week prior

## 2013-08-18 NOTE — Progress Notes (Signed)
   Subjective:    Patient ID: [name], female    DOB: 1950/07/04, 63 y.o.   MRN: [id number]  HPI [name] is a 63 year old female nonsmoker who comes in today for diabetes  She manages her diabetes with diet and exercise. Her A1c 6 months ago was 6.7% now at 6.4%.    Review of Systems    review of systems negative blood pressure everything else within normal limits Objective:   Physical Exam Well-developed well-nourished female no acute distress vital signs stable she is afebrile       Assessment & Plan:

## 2013-08-18 NOTE — Progress Notes (Signed)
Pre visit review using our clinic review tool, if applicable. No additional management support is needed unless otherwise documented below in the visit note. 

## 2013-08-26 ENCOUNTER — Telehealth: Payer: Self-pay

## 2013-08-26 NOTE — Telephone Encounter (Signed)
Relevant patient education mailed to patient.  

## 2013-09-08 ENCOUNTER — Other Ambulatory Visit: Payer: Self-pay | Admitting: Family Medicine

## 2014-01-09 ENCOUNTER — Other Ambulatory Visit: Payer: Self-pay | Admitting: Family Medicine

## 2014-03-08 ENCOUNTER — Other Ambulatory Visit: Payer: 59

## 2014-03-18 ENCOUNTER — Encounter: Payer: 59 | Admitting: Family Medicine

## 2014-04-06 ENCOUNTER — Telehealth: Payer: Self-pay | Admitting: Family Medicine

## 2014-04-06 MED ORDER — ATENOLOL-CHLORTHALIDONE 50-25 MG PO TABS: 1.0000 | ORAL_TABLET | Freq: Every day | ORAL | Status: DC

## 2014-04-06 MED ORDER — LEVOTHYROXINE SODIUM 88 MCG PO TABS: 88.0000 ug | ORAL_TABLET | Freq: Every day | ORAL | Status: DC

## 2014-04-06 MED ORDER — PRAMIPEXOLE DIHYDROCHLORIDE 1 MG PO TABS: 1.0000 mg | ORAL_TABLET | Freq: Every day | ORAL | Status: DC

## 2014-04-06 NOTE — Telephone Encounter (Signed)
Pt request refill  atenolol-chlorthalidone (TENORETIC) 50-25 MG per tablet pramipexole (MIRAPEX) 1 MG tablet levothyroxine (SYNTHROID, LEVOTHROID) 88 MCG tablet  Pt made cpe appt, but will be out of meds next week. Can you refill until she comes in? [facility] [address] [place]  Pt thought she was going to have to change docs, but was able to keep her insurance after all.

## 2014-04-06 NOTE — Telephone Encounter (Signed)
Rx sent 

## 2014-05-08 ENCOUNTER — Other Ambulatory Visit: Payer: Self-pay | Admitting: Family Medicine

## 2014-05-13 ENCOUNTER — Other Ambulatory Visit (INDEPENDENT_AMBULATORY_CARE_PROVIDER_SITE_OTHER): Payer: 59

## 2014-05-13 DIAGNOSIS — Z0000 Encounter for general adult medical examination without abnormal findings: Secondary | ICD-10-CM

## 2014-05-13 LAB — CBC WITH DIFFERENTIAL/PLATELET
BASOS ABS: 0 10*3/uL (ref 0.0–0.1)
BASOS PCT: 0.5 % (ref 0.0–3.0)
Eosinophils Absolute: 0.1 10*3/uL (ref 0.0–0.7)
Eosinophils Relative: 1.7 % (ref 0.0–5.0)
HEMATOCRIT: 40.9 % (ref 36.0–46.0)
Hemoglobin: 13.7 g/dL (ref 12.0–15.0)
LYMPHS ABS: 1.5 10*3/uL (ref 0.7–4.0)
LYMPHS PCT: 26.4 % (ref 12.0–46.0)
MCHC: 33.5 g/dL (ref 30.0–36.0)
MCV: 90.6 fl (ref 78.0–100.0)
MONOS PCT: 6 % (ref 3.0–12.0)
Monocytes Absolute: 0.4 10*3/uL (ref 0.1–1.0)
NEUTROS ABS: 3.8 10*3/uL (ref 1.4–7.7)
NEUTROS PCT: 65.4 % (ref 43.0–77.0)
PLATELETS: 205 10*3/uL (ref 150.0–400.0)
RBC: 4.52 Mil/uL (ref 3.87–5.11)
RDW: 14 % (ref 11.5–15.5)
WBC: 5.9 10*3/uL (ref 4.0–10.5)

## 2014-05-13 LAB — POCT URINALYSIS DIPSTICK
Bilirubin, UA: NEGATIVE
Glucose, UA: NEGATIVE
KETONES UA: NEGATIVE
Leukocytes, UA: NEGATIVE
NITRITE UA: NEGATIVE
Protein, UA: NEGATIVE
RBC UA: NEGATIVE
SPEC GRAV UA: 1.02
UROBILINOGEN UA: 0.2
pH, UA: 6.5

## 2014-05-13 LAB — BASIC METABOLIC PANEL
BUN: 18 mg/dL (ref 6–23)
CO2: 28 meq/L (ref 19–32)
CREATININE: 0.77 mg/dL (ref 0.40–1.20)
Calcium: 9.3 mg/dL (ref 8.4–10.5)
Chloride: 104 mEq/L (ref 96–112)
GFR: 80.76 mL/min (ref >60.00)
Glucose, Bld: 136 mg/dL — ABNORMAL HIGH (ref 70–99)
POTASSIUM: 3.5 meq/L (ref 3.5–5.1)
Sodium: 141 mEq/L (ref 135–145)

## 2014-05-13 LAB — HEPATIC FUNCTION PANEL
ALT: 18 U/L (ref 0–35)
AST: 16 U/L (ref 0–37)
Albumin: 3.9 g/dL (ref 3.5–5.2)
Alkaline Phosphatase: 75 U/L (ref 39–117)
BILIRUBIN DIRECT: 0.1 mg/dL (ref 0.0–0.3)
TOTAL PROTEIN: 6.7 g/dL (ref 6.0–8.3)
Total Bilirubin: 0.7 mg/dL (ref 0.2–1.2)

## 2014-05-13 LAB — LIPID PANEL
Cholesterol: 171 mg/dL (ref 0–200)
HDL: 48.1 mg/dL (ref >39.00)
LDL CALC: 86 mg/dL (ref 0–99)
NonHDL: 122.9
TRIGLYCERIDES: 187 mg/dL — AB (ref 0.0–149.0)
Total CHOL/HDL Ratio: 4
VLDL: 37.4 mg/dL (ref 0.0–40.0)

## 2014-05-13 LAB — HEMOGLOBIN A1C: Hgb A1c MFr Bld: 7.2 % — ABNORMAL HIGH (ref 4.6–6.5)

## 2014-05-13 LAB — TSH: TSH: 3.75 u[IU]/mL (ref 0.35–4.50)

## 2014-05-13 LAB — MICROALBUMIN / CREATININE URINE RATIO
Creatinine,U: 201.9 mg/dL
MICROALB/CREAT RATIO: 0.4 mg/g (ref 0.0–30.0)
Microalb, Ur: 0.9 mg/dL (ref 0.0–1.9)

## 2014-05-20 ENCOUNTER — Encounter: Payer: Self-pay | Admitting: Family Medicine

## 2014-05-20 ENCOUNTER — Ambulatory Visit (INDEPENDENT_AMBULATORY_CARE_PROVIDER_SITE_OTHER): Payer: 59 | Admitting: Family Medicine

## 2014-05-20 VITALS — BP 120/80 | Temp 97.9°F | Ht 64.0 in | Wt 186.0 lb

## 2014-05-20 DIAGNOSIS — E139 Other specified diabetes mellitus without complications: Secondary | ICD-10-CM

## 2014-05-20 DIAGNOSIS — G2581 Restless legs syndrome: Secondary | ICD-10-CM

## 2014-05-20 DIAGNOSIS — E038 Other specified hypothyroidism: Secondary | ICD-10-CM

## 2014-05-20 DIAGNOSIS — N644 Mastodynia: Secondary | ICD-10-CM

## 2014-05-20 DIAGNOSIS — Z23 Encounter for immunization: Secondary | ICD-10-CM

## 2014-05-20 MED ORDER — LEVOTHYROXINE SODIUM 88 MCG PO TABS: ORAL_TABLET | ORAL | Status: DC

## 2014-05-20 MED ORDER — PRAMIPEXOLE DIHYDROCHLORIDE 1 MG PO TABS: 1.0000 mg | ORAL_TABLET | Freq: Every day | ORAL | Status: DC

## 2014-05-20 MED ORDER — ATENOLOL-CHLORTHALIDONE 50-25 MG PO TABS: 1.0000 | ORAL_TABLET | Freq: Every day | ORAL | Status: DC

## 2014-05-20 NOTE — Progress Notes (Signed)
Pre visit review using our clinic review tool, if applicable. No additional management support is needed unless otherwise documented below in the visit note. 

## 2014-05-20 NOTE — Progress Notes (Signed)
Subjective:    Patient ID: [name], female    DOB: [birth year], 64 y.o.   MRN: [id number]  HPI [name] is a 64 year old married female nonsmoker recently retired last September who comes in today for general physical examination because of a history of hypertension, hypothyroidism, restless leg syndrome, and a new problem of pain in her right breast  Her med list reviewed the been no changes. She takes Tenoretic for hypertension her BP is 120/80  She takes Synthroid for hypothyroidism 88 g daily. TSH level normal  Her fasting blood sugars 136 with an A1c of 7.2%.  She takes Mirapex 1 mg daily at bedtime  She no longer needs the Valium because she is not working and she's not under stress although she is taking care of her 62-year-old grandchild.  She does not get routine eye care nor dental care. She has an upper denture. She does not do BSE monthly. Last mammogram was 2013. She did have a colonoscopy about 10 years ago by Dr. Henrene Pastor which was normal  She initially declined vaccinations the been agreed to have him. Tetanus 2013 seasonal flu shot today Pneumovax 13 today she'll call insurance company to find out where she can get the shingles vaccine    Review of Systems  Constitutional: Negative.   HENT: Negative.   Eyes: Negative.   Respiratory: Negative.   Cardiovascular: Negative.   Gastrointestinal: Negative.   Endocrine: Negative.   Genitourinary: Negative.   Musculoskeletal: Negative.   Skin: Negative.   Allergic/Immunologic: Negative.   Neurological: Negative.   Hematological: Negative.   Psychiatric/Behavioral: Negative.        Objective:   Physical Exam  Constitutional: She appears well-developed and well-nourished.  HENT:  Head: Normocephalic and atraumatic.  Right Ear: External ear normal.  Left Ear: External ear normal.  Nose: Nose normal.  Mouth/Throat: Oropharynx is clear and moist.  Eyes: EOM are normal. Pupils are equal, round, and reactive  to light.  Neck: Normal range of motion. Neck supple. No JVD present. No tracheal deviation present. No thyromegaly present.  Cardiovascular: Normal rate, regular rhythm, normal heart sounds and intact distal pulses.  Exam reveals no gallop and no friction rub.   No murmur heard. Pulmonary/Chest: Effort normal and breath sounds normal. No stridor. No respiratory distress. She has no wheezes. She has no rales. She exhibits no tenderness.  Abdominal: Soft. Bowel sounds are normal. She exhibits no distension and no mass. There is no tenderness. There is no rebound and no guarding.  Genitourinary:  Bilateral breast exam normal except for tenderness in the right breast at the 9:00 position at the periphery about 3 inches from the nipple. There appears to be a soft cystic rubbery movable lesion in that area. Recommend she get a mammogram because she is due anyway.  Pelvic exam no longer indicated she had her uterus removed at the time her ovaries were not visible  Musculoskeletal: Normal range of motion.  Lymphadenopathy:    She has no cervical adenopathy.  Neurological: She is alert. She has normal reflexes. No cranial nerve deficit. She exhibits normal muscle tone. Coordination normal.  Skin: Skin is warm and dry. No rash noted. No erythema. No pallor.  Total body skin exam normal  Psychiatric: She has a normal mood and affect. Her behavior is normal. Judgment and thought content normal.          Assessment & Plan:Hypertension at goal.... Continue current therapy  Hypothyroidism at goal ..... Continue current therapy  Restless leg syndrome continue Mirapex 1 mg at bedtime  Diabetes type 2 ......... stressed diet exercise follow-up A1c in 3 months   .Marland KitchenMarland KitchenMarland KitchenMarland Kitchen

## 2014-05-20 NOTE — Patient Instructions (Signed)
Your fasting blood sugar is 136......... high normal 126..........Marland Kitchen A1c is 7.2%.......... this represents early diabetes.............. carbohydrate free diet exercise 30 minutes daily follow-up in 3 months  Nonfasting labs one week prior  Continue other medications  Call and get set up for a screening mammogram  I'll exam Dr. Bing Plume

## 2014-05-21 ENCOUNTER — Other Ambulatory Visit: Payer: Self-pay

## 2014-05-21 DIAGNOSIS — Z1231 Encounter for screening mammogram for malignant neoplasm of breast: Secondary | ICD-10-CM

## 2014-05-26 ENCOUNTER — Ambulatory Visit
Admission: RE | Admit: 2014-05-26 | Discharge: 2014-05-26 | Disposition: A | Discharge status: Home / Self Care | Payer: 59 | Source: Ambulatory Visit

## 2014-05-26 DIAGNOSIS — Z1231 Encounter for screening mammogram for malignant neoplasm of breast: Secondary | ICD-10-CM

## 2014-05-28 ENCOUNTER — Ambulatory Visit (INDEPENDENT_AMBULATORY_CARE_PROVIDER_SITE_OTHER): Payer: 59 | Admitting: *Deleted

## 2014-05-28 DIAGNOSIS — Z23 Encounter for immunization: Secondary | ICD-10-CM

## 2014-06-05 ENCOUNTER — Other Ambulatory Visit: Payer: Self-pay | Admitting: Family Medicine

## 2014-07-05 ENCOUNTER — Telehealth: Payer: Self-pay | Admitting: Family Medicine
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
## 2014-07-08 NOTE — Telephone Encounter (Signed)
Pt went to Urgent Care.  Nothing broken she is still sore.  They told her it would take a couple of weeks to feel better.  She will call back if she needs anything else.  Nothing further needed at this time

## 2014-08-11 ENCOUNTER — Other Ambulatory Visit (INDEPENDENT_AMBULATORY_CARE_PROVIDER_SITE_OTHER): Payer: 59

## 2014-08-11 DIAGNOSIS — E139 Other specified diabetes mellitus without complications: Secondary | ICD-10-CM | POA: Diagnosis not present

## 2014-08-11 LAB — HEMOGLOBIN A1C: Hgb A1c MFr Bld: 6.9 % — ABNORMAL HIGH (ref 4.6–6.5)

## 2014-08-11 LAB — BASIC METABOLIC PANEL
BUN: 17 mg/dL (ref 6–23)
CHLORIDE: 101 meq/L (ref 96–112)
CO2: 27 mEq/L (ref 19–32)
Calcium: 8.9 mg/dL (ref 8.4–10.5)
Creatinine, Ser: 1.3 mg/dL — ABNORMAL HIGH (ref 0.40–1.20)
GFR: 44.09 mL/min — AB (ref >60.00)
GLUCOSE: 219 mg/dL — AB (ref 70–99)
POTASSIUM: 3.2 meq/L — AB (ref 3.5–5.1)
Sodium: 138 mEq/L (ref 135–145)

## 2014-08-16 ENCOUNTER — Encounter: Payer: Self-pay | Admitting: Family Medicine

## 2014-08-16 ENCOUNTER — Ambulatory Visit (INDEPENDENT_AMBULATORY_CARE_PROVIDER_SITE_OTHER): Payer: 59 | Admitting: Family Medicine

## 2014-08-16 VITALS — BP 120/80 | Temp 98.6°F | Wt 187.0 lb

## 2014-08-16 DIAGNOSIS — E139 Other specified diabetes mellitus without complications: Secondary | ICD-10-CM | POA: Diagnosis not present

## 2014-08-16 DIAGNOSIS — I10 Essential (primary) hypertension: Secondary | ICD-10-CM | POA: Diagnosis not present

## 2014-08-16 MED ORDER — LOSARTAN POTASSIUM 50 MG PO TABS: 50.0000 mg | ORAL_TABLET | Freq: Every day | ORAL | Status: DC

## 2014-08-16 NOTE — Patient Instructions (Signed)
Stop the Tenoretic  Cozaar 50 mg.......... one tablet daily in the morning  Check your blood pressure daily in the morning  Return in 3 weeks for follow-up......Marland Kitchen bring a record of all your blood pressure readings and the device  Continue diet  Walk 30 minutes daily  Follow-up A1c in 3 months......... see Beaulah Dinning for follow-up

## 2014-08-16 NOTE — Progress Notes (Signed)
   Subjective:    Patient ID: [name], female    DOB: Jan 20, 1951, 64 y.o.   MRN: [id number]  HPI [name] is a 64 year old female who comes in today for evaluation diabetes and hypertension  On diet and exercise she's gotten her A1c down to 6.9%. Her blood sugar the other day was 219. She tells me she's been off her diet because of some family issues  Weight is unchanged 187 pounds  BP 120/80 on Tenoretic 50-25 daily potassium down to 3.2. Was switched from the Tenoretic to [employer]   Review of Systems Review of systems otherwise negative    Objective:   Physical Exam Well-developed well-nourished female no acute distress vital signs stable she's afebrile       Assessment & Plan:  Diabetes type 2 at goal with diet and exercise...Marland KitchenMarland KitchenMarland Kitchen A1c 6.9%..... continue diet and exercise follow-up A1c in 3 months  Hypertension....... stop the Tenoretic....... start losartan

## 2014-08-16 NOTE — Progress Notes (Signed)
Pre visit review using our clinic review tool, if applicable. No additional management support is needed unless otherwise documented below in the visit note. 

## 2014-08-17 ENCOUNTER — Telehealth: Payer: Self-pay | Admitting: Family Medicine

## 2014-08-18 ENCOUNTER — Ambulatory Visit: Payer: 59 | Admitting: Family Medicine

## 2014-08-25 ENCOUNTER — Telehealth: Payer: Self-pay | Admitting: *Deleted

## 2014-08-25 NOTE — Telephone Encounter (Signed)
Patient is calling because she is have some edema with her ankles and feet since starting the new prescription for losartan.  Patient is aware that Dr Sherren Mocha is out of the office until tomorrow.

## 2014-08-26 MED ORDER — HYDROCHLOROTHIAZIDE 12.5 MG PO TABS: 12.5000 mg | ORAL_TABLET | Freq: Every day | ORAL | Status: DC

## 2014-08-26 NOTE — Telephone Encounter (Signed)
Per Dr Sherren Mocha, patient should add 12.5 HCTZ daily.  Patient is aware and rx sent.

## 2014-09-06 ENCOUNTER — Ambulatory Visit (INDEPENDENT_AMBULATORY_CARE_PROVIDER_SITE_OTHER): Payer: 59 | Admitting: Family Medicine

## 2014-09-06 ENCOUNTER — Encounter: Payer: Self-pay | Admitting: Family Medicine

## 2014-09-06 VITALS — Temp 98.1°F | Wt 187.0 lb

## 2014-09-06 DIAGNOSIS — I10 Essential (primary) hypertension: Secondary | ICD-10-CM

## 2014-09-06 MED ORDER — LOSARTAN POTASSIUM-HCTZ 100-25 MG PO TABS: 1.0000 | ORAL_TABLET | Freq: Every day | ORAL | Status: DC

## 2014-09-06 MED ORDER — POTASSIUM CHLORIDE CRYS ER 20 MEQ PO TBCR: 20.0000 meq | EXTENDED_RELEASE_TABLET | Freq: Every day | ORAL | Status: DC

## 2014-09-06 NOTE — Progress Notes (Signed)
   Subjective:    Patient ID: [name], female    DOB: [birth year], 64 y.o.   MRN: [id number]  HPI [name] is a 64 year-old female single nonsmoker who comes in today for follow-up of hypertension  Her blood pressures running 160-170/90 right arm sitting position. She's had a core thiazide 12.5 mg daily and Cozaar 50 mg daily she also concerned because her feet are swollen.   Review of Systems    review of systems otherwise negative Objective:   Physical Exam  Well-developed well-nourished female no acute distress vital signs stable she's afebrile except for BP 729-021 systolic diastolic at 90      Assessment & Plan:  Hypertension not at goal.......... increase Cozaar to 100 mg daily increased diuretics to 25 mg daily....... BP check daily follow-up in one month  Diabetes type 2 at goal,,,,,,,,,,,, A1c 6.9%

## 2014-09-06 NOTE — Patient Instructions (Signed)
Hyzaar 100-25........... one tablet daily in the morning  Potassium supplement 20 mEq............ one tablet daily in the morning  Check your blood pressure with a new blood pressure cuff........... Omron pump up digital blood pressure cuff..... [place]  Return in 4 weeks for follow-up with [employer]........Marland Kitchen our new adult nurse practitioner from [facility] a record of all your blood pressure readings and the new device

## 2014-09-06 NOTE — Progress Notes (Signed)
Pre visit review using our clinic review tool, if applicable. No additional management support is needed unless otherwise documented below in the visit note. 

## 2014-10-04 ENCOUNTER — Ambulatory Visit (INDEPENDENT_AMBULATORY_CARE_PROVIDER_SITE_OTHER): Payer: 59 | Admitting: Adult Health

## 2014-10-04 ENCOUNTER — Encounter: Payer: Self-pay | Admitting: Adult Health

## 2014-10-04 VITALS — BP 148/90 | Temp 98.2°F | Ht 64.0 in | Wt 186.9 lb

## 2014-10-04 DIAGNOSIS — I159 Secondary hypertension, unspecified: Secondary | ICD-10-CM | POA: Diagnosis not present

## 2014-10-04 DIAGNOSIS — E876 Hypokalemia: Secondary | ICD-10-CM | POA: Diagnosis not present

## 2014-10-04 LAB — BASIC METABOLIC PANEL
BUN: 16 mg/dL (ref 6–23)
CO2: 29 mEq/L (ref 19–32)
Calcium: 9.3 mg/dL (ref 8.4–10.5)
Chloride: 103 mEq/L (ref 96–112)
Creatinine, Ser: 0.84 mg/dL (ref 0.40–1.20)
GFR: 72.95 mL/min (ref >60.00)
Glucose, Bld: 142 mg/dL — ABNORMAL HIGH (ref 70–99)
POTASSIUM: 3.9 meq/L (ref 3.5–5.1)
SODIUM: 138 meq/L (ref 135–145)

## 2014-10-04 MED ORDER — LISINOPRIL 5 MG PO TABS: 5.0000 mg | ORAL_TABLET | Freq: Every day | ORAL | Status: DC

## 2014-10-04 NOTE — Patient Instructions (Addendum)
It was great meeting you today. I will call you after your labs are back and will discuss what blood pressure medication to change or add.   You can try over the counter omeprazole for you heart burn.   Continue to exercise and eat a heart healthy diet as well as monitor your blood pressures.

## 2014-10-04 NOTE — Progress Notes (Signed)
Pre visit review using our clinic review tool, if applicable. No additional management support is needed unless otherwise documented below in the visit note. 

## 2014-10-04 NOTE — Progress Notes (Signed)
Subjective:    Patient ID: [name], female    DOB: 1950-10-02, 64 y.o.   MRN: [id number]  HPI  She is here today for follow up regarding her blood pressure. She was seen by Dr. Sherren Mocha in May at which time her HCTZ was increased from 12.5 to 25mg  and her Cozar was inscreased from 50 to 100mg .   Her blood pressures continue to be elevated in the 491 systolic and mid to high 79'X - 50'V diastolic. Her lower extremity edema has decreased significantly.   She has been trying to stay away from salty stuff and away from soda.   She has recently had issues with pain in her stomach right before eating. Right after eating she has diarrhea. This does not happen every day and she has not tried anything for it. She does endorse issues with acidic food.    Review of Systems  Constitutional: Negative.   Eyes: Negative.   Respiratory: Negative.   Cardiovascular: Negative.   Gastrointestinal: Negative.   Genitourinary: Negative.   Neurological: Negative.   All other systems reviewed and are negative.  Past Medical History  Diagnosis Date  . HYPOTHYROIDISM 12/11/2006  . DIABETES MELLITUS, TYPE II 12/11/2006  . ANXIETY DEPRESSION 09/15/2007  . VIRAL URI 09/16/2008  . ASTHMA 05/15/2009  . DIVERTICULOSIS, COLON 12/11/2006  . Edema 01/15/2009  . Headache(784.0) 12/11/2006  . PELVIC PAIN, CHRONIC 08/14/2007  . HYPERTENSION NEC 08/14/2007    History   Social History  . Marital Status: Married    Spouse Name: N/A  . Number of Children: N/A  . Years of Education: N/A   Occupational History  . Not on file.   Social History Main Topics  . Smoking status: Never Smoker   . Smokeless tobacco: Not on file  . Alcohol Use: Not on file  . Drug Use: Not on file  . Sexual Activity: Not on file   Other Topics Concern  . Not on file   Social History Narrative    Past Surgical History  Procedure Laterality Date  . Abdominal hysterectomy    . Appendectomy      Family History  Problem  Relation Age of Onset  . COPD Mother   . Diabetes Mother   . COPD Father     Allergies  Allergen Reactions  . Amoxicillin   . Aspirin   . Chlorphen-Phenyleph-Methscop     REACTION: rash  . Codeine   . Diphenhydramine Hcl     REACTION: swelling-mouth  . Hydrocodone   . Indomethacin   . Pentazocine Lactate     REACTION: passing out  . Promethazine Hcl   . Quinine     REACTION: itching redface    Current Outpatient Prescriptions on File Prior to Visit  Medication Sig Dispense Refill  . aspirin 81 MG tablet Take 81 mg by mouth at bedtime.    . calcium carbonate 200 MG capsule Take 250 mg by mouth 2 (two) times daily with a meal.    . fish oil-omega-3 fatty acids 1000 MG capsule Take 1 g by mouth 2 (two) times daily.      Marland Kitchen levothyroxine (SYNTHROID, LEVOTHROID) 88 MCG tablet TAKE 1 TABLET EVERY DAY 90 tablet 3  . losartan-hydrochlorothiazide (HYZAAR) 100-25 MG per tablet Take 1 tablet by mouth daily. 100 tablet 3  . Multiple Vitamin (MULTIVITAMIN WITH MINERALS) TABS tablet Take 1 tablet by mouth daily.    . potassium chloride SA (K-DUR,KLOR-CON) 20 MEQ tablet Take 1 tablet (  20 mEq total) by mouth daily. 100 tablet 3  . pramipexole (MIRAPEX) 1 MG tablet TAKE 1 TABLET BY MOUTH EVERY DAY 90 tablet 3  . PROAIR HFA 108 (90 BASE) MCG/ACT inhaler     . scopolamine (TRANSDERM-SCOP) 1 MG/3DAYS Place 1 patch (1.5 mg total) onto the skin every 3 (three) days. 4 patch 2  . Vitamin D, Ergocalciferol, (DRISDOL) 50000 UNITS CAPS capsule Take 50,000 Units by mouth every 7 (seven) days.    . diazepam (VALIUM) 2 MG tablet 1 by mouth each bedtime when necessary (Patient not taking: Reported on 10/04/2014) 30 tablet 3  . ibuprofen (ADVIL,MOTRIN) 600 MG tablet      No current facility-administered medications on file prior to visit.    BP 148/90 mmHg  Temp(Src) 98.2 F (36.8 C) (Oral)  Ht 5\' 4"  (1.626 m)  Wt 186 lb 14.4 oz (84.777 kg)  BMI 32.07 kg/m2       Objective:   Physical Exam    Constitutional: She is oriented to person, place, and time. She appears well-developed and well-nourished.  Slightly obese  Cardiovascular: Normal rate, regular rhythm, normal heart sounds and intact distal pulses.  Exam reveals no gallop.   No murmur heard. Pulmonary/Chest: Effort normal and breath sounds normal. No respiratory distress. She has no wheezes. She has no rales. She exhibits no tenderness.  Abdominal: Soft. Bowel sounds are normal. She exhibits no distension. There is no tenderness. There is no rebound and no guarding.  Neurological: She is alert and oriented to person, place, and time.  Skin: Skin is warm and dry. She is not diaphoretic.  Psychiatric: She has a normal mood and affect. Her behavior is normal. Judgment and thought content normal.  Nursing note and vitals reviewed.      Assessment & Plan:  1. Secondary hypertension, unspecified - Basic metabolic panel- WNL - Will trial Lisinopril 5mg  - Follow up in two weeks  2. Hypokalemia - Basic metabolic panel - K 3.9

## 2014-10-19 ENCOUNTER — Encounter: Payer: Self-pay | Admitting: Adult Health

## 2014-10-19 ENCOUNTER — Ambulatory Visit (INDEPENDENT_AMBULATORY_CARE_PROVIDER_SITE_OTHER): Payer: 59 | Admitting: Adult Health

## 2014-10-19 VITALS — BP 120/80 | Temp 98.1°F | Ht 64.0 in | Wt 188.0 lb

## 2014-10-19 DIAGNOSIS — I10 Essential (primary) hypertension: Secondary | ICD-10-CM

## 2014-10-19 DIAGNOSIS — K219 Gastro-esophageal reflux disease without esophagitis: Secondary | ICD-10-CM | POA: Insufficient documentation

## 2014-10-19 NOTE — Progress Notes (Signed)
Subjective:    Patient ID: [name], female    DOB: Jan 21, 1951, 64 y.o.   MRN: [id number]  HPI  Here for follow up regarding blood pressure. She was started on 5 mg Lisinopril during last visit, this was in addition to her already scheduled medications of HCTZ 25mg  and Cozar 100mg . She was also started on a two week trial of Omeprazole.   Her blood pressures have improved but are still not at goal, she is between 793-903 systolic. She does endorse " a tickle" in the back of her throat for the last two weeks. She believes it is due to allergies and not the lisinopril.   Her stomach pain has gone away since starting Omeprazole.   Review of Systems  Constitutional: Negative.   Respiratory: Negative.   Cardiovascular: Negative.   Gastrointestinal: Negative.   Neurological: Negative.   All other systems reviewed and are negative.  Past Medical History  Diagnosis Date  . HYPOTHYROIDISM 12/11/2006  . DIABETES MELLITUS, TYPE II 12/11/2006  . ANXIETY DEPRESSION 09/15/2007  . VIRAL URI 09/16/2008  . ASTHMA 05/15/2009  . DIVERTICULOSIS, COLON 12/11/2006  . Edema 01/15/2009  . Headache(784.0) 12/11/2006  . PELVIC PAIN, CHRONIC 08/14/2007  . HYPERTENSION NEC 08/14/2007    History   Social History  . Marital Status: Married    Spouse Name: N/A  . Number of Children: N/A  . Years of Education: N/A   Occupational History  . Not on file.   Social History Main Topics  . Smoking status: Never Smoker   . Smokeless tobacco: Not on file  . Alcohol Use: Not on file  . Drug Use: Not on file  . Sexual Activity: Not on file   Other Topics Concern  . Not on file   Social History Narrative    Past Surgical History  Procedure Laterality Date  . Abdominal hysterectomy    . Appendectomy      Family History  Problem Relation Age of Onset  . COPD Mother   . Diabetes Mother   . COPD Father     Allergies  Allergen Reactions  . Amoxicillin   . Aspirin   .  Chlorphen-Phenyleph-Methscop     REACTION: rash  . Codeine   . Diphenhydramine Hcl     REACTION: swelling-mouth  . Hydrocodone   . Indomethacin   . Pentazocine Lactate     REACTION: passing out  . Promethazine Hcl   . Quinine     REACTION: itching redface    Current Outpatient Prescriptions on File Prior to Visit  Medication Sig Dispense Refill  . aspirin 81 MG tablet Take 81 mg by mouth at bedtime.    . calcium carbonate 200 MG capsule Take 250 mg by mouth 2 (two) times daily with a meal.    . diazepam (VALIUM) 2 MG tablet 1 by mouth each bedtime when necessary (Patient not taking: Reported on 10/04/2014) 30 tablet 3  . fish oil-omega-3 fatty acids 1000 MG capsule Take 1 g by mouth 2 (two) times daily.      Marland Kitchen ibuprofen (ADVIL,MOTRIN) 600 MG tablet     . levothyroxine (SYNTHROID, LEVOTHROID) 88 MCG tablet TAKE 1 TABLET EVERY DAY 90 tablet 3  . lisinopril (PRINIVIL,ZESTRIL) 5 MG tablet Take 1 tablet (5 mg total) by mouth daily. 90 tablet 3  . losartan-hydrochlorothiazide (HYZAAR) 100-25 MG per tablet Take 1 tablet by mouth daily. 100 tablet 3  . Multiple Vitamin (MULTIVITAMIN WITH MINERALS) TABS tablet Take 1  tablet by mouth daily.    . potassium chloride SA (K-DUR,KLOR-CON) 20 MEQ tablet Take 1 tablet (20 mEq total) by mouth daily. 100 tablet 3  . pramipexole (MIRAPEX) 1 MG tablet TAKE 1 TABLET BY MOUTH EVERY DAY 90 tablet 3  . PROAIR HFA 108 (90 BASE) MCG/ACT inhaler     . scopolamine (TRANSDERM-SCOP) 1 MG/3DAYS Place 1 patch (1.5 mg total) onto the skin every 3 (three) days. 4 patch 2  . Vitamin D, Ergocalciferol, (DRISDOL) 50000 UNITS CAPS capsule Take 50,000 Units by mouth every 7 (seven) days.     No current facility-administered medications on file prior to visit.    BP 120/80 mmHg  Temp(Src) 98.1 F (36.7 C) (Oral)  Ht 5\' 4"  (1.626 m)  Wt 188 lb (85.276 kg)  BMI 32.25 kg/m2       Objective:   Physical Exam  Constitutional: She is oriented to person, place, and  time. She appears well-developed and well-nourished. No distress.  Cardiovascular: Normal rate, regular rhythm, normal heart sounds and intact distal pulses.  Exam reveals no gallop and no friction rub.   No murmur heard. Lower extremity edema  Pulmonary/Chest: Effort normal and breath sounds normal. No respiratory distress. She has no wheezes. She has no rales. She exhibits no tenderness.  Neurological: She is alert and oriented to person, place, and time.  Skin: Skin is warm and dry. No rash noted. She is not diaphoretic. No erythema. No pallor.  Psychiatric: She has a normal mood and affect. Her behavior is normal. Judgment and thought content normal.  Nursing note and vitals reviewed.      Assessment & Plan:  1. Essential hypertension - Trial 10 mg of Lisinopril. She is to send me a note via Mychart in 3-5 days with new blood pressure readings.  - Will follow up with throat irritation as this may be due to lisinopril.   2. Gastroesophageal reflux disease without esophagitis - Resolved

## 2014-10-19 NOTE — Progress Notes (Signed)
Pre visit review using our clinic review tool, if applicable. No additional management support is needed unless otherwise documented below in the visit note. 

## 2014-10-19 NOTE — Patient Instructions (Signed)
It was great seeing you again!   For the next few days take 10 mg of the lisinopril and send me a note on Mychart with your readings.

## 2014-10-22 ENCOUNTER — Encounter: Payer: Self-pay | Admitting: Adult Health

## 2014-10-27 ENCOUNTER — Encounter: Payer: Self-pay | Admitting: Adult Health

## 2014-10-27 DIAGNOSIS — I159 Secondary hypertension, unspecified: Secondary | ICD-10-CM

## 2014-10-27 MED ORDER — LISINOPRIL 10 MG PO TABS: 10.0000 mg | ORAL_TABLET | Freq: Every day | ORAL | Status: DC

## 2014-10-27 NOTE — Telephone Encounter (Signed)
Ok per [place] to change dose to 10 mg.  Rx sent to pharmacy and message sent to patient making aware.

## 2014-11-09 ENCOUNTER — Other Ambulatory Visit (INDEPENDENT_AMBULATORY_CARE_PROVIDER_SITE_OTHER): Payer: 59

## 2014-11-09 ENCOUNTER — Ambulatory Visit: Payer: 59 | Admitting: Adult Health

## 2014-11-09 DIAGNOSIS — E139 Other specified diabetes mellitus without complications: Secondary | ICD-10-CM | POA: Diagnosis not present

## 2014-11-09 LAB — BASIC METABOLIC PANEL
BUN: 19 mg/dL (ref 6–23)
CALCIUM: 9.4 mg/dL (ref 8.4–10.5)
CO2: 28 meq/L (ref 19–32)
Chloride: 105 mEq/L (ref 96–112)
Creatinine, Ser: 0.91 mg/dL (ref 0.40–1.20)
GFR: 66.49 mL/min (ref >60.00)
Glucose, Bld: 137 mg/dL — ABNORMAL HIGH (ref 70–99)
POTASSIUM: 4.4 meq/L (ref 3.5–5.1)
SODIUM: 140 meq/L (ref 135–145)

## 2014-11-09 LAB — HEMOGLOBIN A1C: Hgb A1c MFr Bld: 7 % — ABNORMAL HIGH (ref 4.6–6.5)

## 2014-11-16 ENCOUNTER — Ambulatory Visit (INDEPENDENT_AMBULATORY_CARE_PROVIDER_SITE_OTHER): Payer: 59 | Admitting: Adult Health

## 2014-11-16 ENCOUNTER — Encounter: Payer: Self-pay | Admitting: Adult Health

## 2014-11-16 ENCOUNTER — Other Ambulatory Visit: Payer: Self-pay | Admitting: Adult Health

## 2014-11-16 VITALS — BP 160/70 | Temp 97.9°F | Ht 64.0 in | Wt 188.2 lb

## 2014-11-16 DIAGNOSIS — E1165 Type 2 diabetes mellitus with hyperglycemia: Secondary | ICD-10-CM

## 2014-11-16 DIAGNOSIS — E039 Hypothyroidism, unspecified: Secondary | ICD-10-CM | POA: Diagnosis not present

## 2014-11-16 DIAGNOSIS — I10 Essential (primary) hypertension: Secondary | ICD-10-CM | POA: Diagnosis not present

## 2014-11-16 DIAGNOSIS — IMO0002 Reserved for concepts with insufficient information to code with codable children: Secondary | ICD-10-CM

## 2014-11-16 LAB — TSH: TSH: 1.61 u[IU]/mL (ref 0.35–4.50)

## 2014-11-16 MED ORDER — AMLODIPINE BESYLATE 5 MG PO TABS: 5.0000 mg | ORAL_TABLET | Freq: Every day | ORAL | Status: DC

## 2014-11-16 NOTE — Progress Notes (Signed)
Subjective:    Patient ID: [name], female    DOB: 01-24-1951, 64 y.o.   MRN: [id number]  HPI  64 year old female   has a past medical history of HYPOTHYROIDISM (12/11/2006); DIABETES MELLITUS, TYPE II (12/11/2006); ANXIETY DEPRESSION (09/15/2007); VIRAL URI (09/16/2008); ASTHMA (05/15/2009); DIVERTICULOSIS, COLON (12/11/2006); Edema (01/15/2009); QXIHWTUU(828.0) (12/11/2006); PELVIC PAIN, CHRONIC (08/14/2007); and HYPERTENSION NEC (08/14/2007). She is in the office today for follow up regarding her hypertension and diabetes.   I last saw here on 10/19/2014 at which time we we increased her lisinopril to 10 mg and she continued with Hyzaar 100-25. She continues to have a cough. Her home blood pressures are between 113- 145 at home.   Her most recent A1c is 7.0. She is very reluctant to go on any medications and refused any diabeties medications at this time. She has stopped drinking soda for the last two days. She is not checking her blood sugars at home. She was educated on the importance of diet and exercise. She was also educated on the complications of diabetes if not controlled. She has two sisters who are insulin dependent and she understands these complications.    Review of Systems  Constitutional: Positive for fatigue.  Respiratory: Negative.   Cardiovascular: Positive for leg swelling (bilateal ankle swelling).  Endocrine: Negative.   Neurological: Negative.   Hematological: Negative.   Psychiatric/Behavioral: Negative.   All other systems reviewed and are negative.  Past Medical History  Diagnosis Date  . HYPOTHYROIDISM 12/11/2006  . DIABETES MELLITUS, TYPE II 12/11/2006  . ANXIETY DEPRESSION 09/15/2007  . VIRAL URI 09/16/2008  . ASTHMA 05/15/2009  . DIVERTICULOSIS, COLON 12/11/2006  . Edema 01/15/2009  . Headache(784.0) 12/11/2006  . PELVIC PAIN, CHRONIC 08/14/2007  . HYPERTENSION NEC 08/14/2007    History   Social History  . Marital Status: Married    Spouse Name: N/A  .  Number of Children: N/A  . Years of Education: N/A   Occupational History  . Not on file.   Social History Main Topics  . Smoking status: Never Smoker   . Smokeless tobacco: Not on file  . Alcohol Use: Not on file  . Drug Use: Not on file  . Sexual Activity: Not on file   Other Topics Concern  . Not on file   Social History Narrative    Past Surgical History  Procedure Laterality Date  . Abdominal hysterectomy    . Appendectomy      Family History  Problem Relation Age of Onset  . COPD Mother   . Diabetes Mother   . COPD Father     Allergies  Allergen Reactions  . Amoxicillin   . Aspirin   . Chlorphen-Phenyleph-Methscop     REACTION: rash  . Codeine   . Diphenhydramine Hcl     REACTION: swelling-mouth  . Hydrocodone   . Indomethacin   . Pentazocine Lactate     REACTION: passing out  . Promethazine Hcl   . Quinine     REACTION: itching redface    Current Outpatient Prescriptions on File Prior to Visit  Medication Sig Dispense Refill  . aspirin 81 MG tablet Take 81 mg by mouth at bedtime.    . calcium carbonate 200 MG capsule Take 250 mg by mouth 2 (two) times daily with a meal.    . diazepam (VALIUM) 2 MG tablet 1 by mouth each bedtime when necessary (Patient not taking: Reported on 10/04/2014) 30 tablet 3  . fish oil-omega-3  fatty acids 1000 MG capsule Take 1 g by mouth 2 (two) times daily.      Marland Kitchen ibuprofen (ADVIL,MOTRIN) 600 MG tablet     . levothyroxine (SYNTHROID, LEVOTHROID) 88 MCG tablet TAKE 1 TABLET EVERY DAY 90 tablet 3  . losartan-hydrochlorothiazide (HYZAAR) 100-25 MG per tablet Take 1 tablet by mouth daily. 100 tablet 3  . Multiple Vitamin (MULTIVITAMIN WITH MINERALS) TABS tablet Take 1 tablet by mouth daily.    . potassium chloride SA (K-DUR,KLOR-CON) 20 MEQ tablet Take 1 tablet (20 mEq total) by mouth daily. 100 tablet 3  . pramipexole (MIRAPEX) 1 MG tablet TAKE 1 TABLET BY MOUTH EVERY DAY 90 tablet 3  . PROAIR HFA 108 (90 BASE) MCG/ACT  inhaler     . scopolamine (TRANSDERM-SCOP) 1 MG/3DAYS Place 1 patch (1.5 mg total) onto the skin every 3 (three) days. 4 patch 2  . Vitamin D, Ergocalciferol, (DRISDOL) 50000 UNITS CAPS capsule Take 50,000 Units by mouth every 7 (seven) days.     No current facility-administered medications on file prior to visit.    BP 160/70 mmHg  Temp(Src) 97.9 F (36.6 C) (Oral)  Ht 5\' 4"  (1.626 m)  Wt 188 lb 3.2 oz (85.367 kg)  BMI 32.29 kg/m2       Objective:   Physical Exam  Constitutional: She is oriented to person, place, and time. She appears well-developed and well-nourished. No distress.  Tearful about A1c results  Cardiovascular: Normal rate, regular rhythm, normal heart sounds and intact distal pulses.  Exam reveals no gallop and no friction rub.   No murmur heard. Pulmonary/Chest: Effort normal and breath sounds normal. No respiratory distress. She has no wheezes. She has no rales. She exhibits no tenderness.  Musculoskeletal: Normal range of motion. She exhibits edema (trace at bilateral ankles).  Neurological: She is alert and oriented to person, place, and time.  Skin: Skin is warm and dry. No rash noted. She is not diaphoretic. No erythema. No pallor.  Psychiatric: She has a normal mood and affect. Her behavior is normal. Judgment and thought content normal.  Nursing note and vitals reviewed.      Assessment & Plan:  1. Essential hypertension - Discontinue lisinopril - amLODipine (NORVASC) 5 MG tablet; Take 1 tablet (5 mg total) by mouth daily.  Dispense: 90 tablet; Refill: 0  2. Diabetes type 2, uncontrolled - Glucometer given to patient and she was taught how to use it. She will keep log and bring it to next visit.  - Amb Referral to Nutrition and Diabetic E - We will do three more months of additional diet control and then start on HCTZ if needed  3. Hypothyroidism, unspecified hypothyroidism type - TSH

## 2014-11-16 NOTE — Progress Notes (Signed)
Pre visit review using our clinic review tool, if applicable. No additional management support is needed unless otherwise documented below in the visit note. 

## 2014-11-16 NOTE — Patient Instructions (Addendum)
It was great seeing you again!   The plan for the next three months is as follows:  1. You have to stop drinking soda and start eating a diabetic diet. This was we can keep this diet controlled. We will recheck your A1c in three months, at that time we may have to start you on oral diabetes medications. The Internet is full of recipes for a diabetic diet.   2. Start taking your blood sugars in the morning when you first wake up and at night before you go to bed.   3. Stop taking lisinopril and start taking Norvasc  4. Continue to monitor your blood pressure  5. I will follow up with you regarding your thyroid level.   Please let me know if you need anything in the mean time.

## 2014-11-18 ENCOUNTER — Encounter: Payer: Self-pay | Admitting: Adult Health

## 2014-11-18 DIAGNOSIS — IMO0002 Reserved for concepts with insufficient information to code with codable children: Secondary | ICD-10-CM

## 2014-11-18 DIAGNOSIS — E1165 Type 2 diabetes mellitus with hyperglycemia: Secondary | ICD-10-CM

## 2014-11-18 MED ORDER — GLUCOSE BLOOD VI STRP: ORAL_STRIP | Status: DC

## 2014-11-18 NOTE — Telephone Encounter (Signed)
Called and spoke with pt; pharmacy verified.  Pt states she test twice daily.  Rx sent to the pharmacy and pt is aware.

## 2014-11-19 ENCOUNTER — Encounter: Payer: 59 | Attending: Adult Health | Admitting: Dietician

## 2014-11-19 ENCOUNTER — Encounter: Payer: Self-pay | Admitting: Dietician

## 2014-11-19 VITALS — Ht 64.0 in | Wt 185.0 lb

## 2014-11-19 DIAGNOSIS — IMO0002 Reserved for concepts with insufficient information to code with codable children: Secondary | ICD-10-CM

## 2014-11-19 DIAGNOSIS — E1165 Type 2 diabetes mellitus with hyperglycemia: Secondary | ICD-10-CM | POA: Insufficient documentation

## 2014-11-19 DIAGNOSIS — Z713 Dietary counseling and surveillance: Secondary | ICD-10-CM | POA: Diagnosis not present

## 2014-11-19 NOTE — Patient Instructions (Addendum)
Join the [employer].  Are you eligible for the Silver Sneakers program?  Are you eligible for childcare? Aim for 30 minutes of exercise most days.  (walking, swimming, or whatever you enjoy) Consider consistent use of Omega 3. Great job on changing your beverages!! Aim for 3 carbs at each meal (45 grams carbs) Aim for 0-1 carb for each snack if hungry. Protein in moderation with meals and snacks.

## 2014-11-19 NOTE — Progress Notes (Signed)
Diabetes Self-Management Education  Visit Type: First/Initial  Appt. Start Time: 0800 Appt. End Time: 0930  11/19/2014  [name], identified by name and date of birth, is a 64 y.o. female with a diagnosis of Diabetes: Type 2 (2012- never meds.).  Other people present during visit:  Patient   Patient lives with her husband and cares for her 22 yo grandson during the days until he begins school in the fall.  She retired from [employer] 1 year ago. She gained about 4 lbs in the past year but has lost 3 lbs recently.  Patient has stress with husband.  She fears that she will not be able to cook the way that is best for her and continue to provide her husband with his preferences.  She "hates to cook".  Both shop but patient cooks.  Patient cried some during visit. She is the youngest of 6 children.  All of her siblings and parents have diabetes.  She does not want to go on any medications for diabetes and is very upset about her blood sugar numbers.  A sister died this spring and she is working through this.  She addends a meditation class and has sen a counselor in the past.   Encouraged her to continue with this as needed.  Since her last visit with Einar Pheasant, NP, patient states that she has stropped drinking regular Dr. Malachi Bonds (she drank 3-4 per day) and has begun walking.  She has also decreased her portion size.  She plans on joining the [facility].  Discussed investigating about Silver Sneakers.    ASSESSMENT  Height 5\' 4"  (1.626 m), weight 185 lb (83.915 kg). Body mass index is 31.74 kg/(m^2).  Initial Visit Information:  Are you currently following a meal plan?: Yes What type of meal plan do you follow?: decreasing portions, not snacking at night Are you taking your medications as prescribed?: Yes Are you checking your feet?: No   How often do you need to have someone help you when you read instructions, pamphlets, or other written materials from your doctor or pharmacy?: 1 - Never What  is the last grade level you completed in school?: 12 th grade HS  Psychosocial:    Patient Belief/Attitude about Diabetes: Afraid Self-care barriers: None Self-management support: Doctor's office, Family, Friends Other persons present: Patient Patient Concerns: Nutrition/Meal planning, Weight Control  Complications:   Last HgB A1C per patient/outside source: 7 mg/dL (11/09/14) How often do you check your blood sugar?: 1-2 times/day Fasting Blood glucose range (mg/dL): 130-179 Postprandial Blood glucose range (mg/dL): 130-179 Number of hyperglycemic episodes per week: 0 Have you had a dilated eye exam in the past 12 months?: No Have you had a dental exam in the past 12 months?: No  Diet Intake:  Breakfast: Protein shake with mixed berries, whole milk OR 2 slices bacon, pancakes or french toast OR cheerios, sprinkle of sugar, canteloupe, banana, whole milk Lunch: [place] chicken with veges, rice OR spinach dip and veges and popcorn Snack (afternoon): occasional cookie or chocolate kiss Dinner: Grilled tenderloin, green beans, applesauce, cornbread with butter OR 1/4 chicken breast, carrots, broccoli, salad Snack (evening): handful of chips and dip  Exercise:     Individualized Plan for Diabetes Self-Management Training:   Learning Objective:  Patient will have a greater understanding of diabetes self-management.  Patient education plan per assessed needs and concerns is to attend individual sessions.      Education Topics Reviewed with Patient Today:  Definition of diabetes, type  1 and 2, and the diagnosis of diabetes Role of diet in the treatment of diabetes and the relationship between the three main macronutrients and blood glucose level, Food label reading, portion sizes and measuring food., Carbohydrate counting Role of exercise on diabetes management, blood pressure control and cardiac health., Helped patient identify appropriate exercises in relation to his/her  diabetes, diabetes complications and other health issue.   Identified appropriate SMBG and/or A1C goals., Yearly dilated eye exam, Daily foot exams   Dental care, Retinopathy and reason for yearly dilated eye exams Role of stress on diabetes, Worked with patient to identify barriers to care and solutions, Identified and addressed patients feelings and concerns about diabetes      PATIENTS GOALS/Plan (Developed by the patient):  Nutrition: Follow meal plan discussed, General guidelines for healthy choices and portions discussed Physical Activity: Exercise 3-5 times per week, 30 minutes per day Monitoring : test my blood glucose as discussed (note x per day with comment)  Plan:   Patient Instructions  Join the [employer].  Are you eligible for the Silver Sneakers program?  Are you eligible for childcare? Aim for 30 minutes of exercise most days.  (walking, swimming, or whatever you enjoy) Consider consistent use of Omega 3. Great job on changing your beverages!! Aim for 3 carbs at each meal (45 grams carbs) Aim for 0-1 carb for each snack if hungry. Protein in moderation with meals and snacks.   Expected Outcomes:  Demonstrated interest in learning. Expect positive outcomes  Education material provided: Living Well with Diabetes, Food label handouts, A1C conversion sheet, Meal plan card, Snack sheet and Support group flyer, breakfast  If problems or questions, patient to contact team via:  Phone  Future DSME appointment: 4-6 wks

## 2014-12-24 ENCOUNTER — Encounter: Payer: 59 | Attending: Adult Health | Admitting: Dietician

## 2014-12-24 ENCOUNTER — Encounter: Payer: Self-pay | Admitting: Dietician

## 2014-12-24 VITALS — Wt 183.0 lb

## 2014-12-24 DIAGNOSIS — Z713 Dietary counseling and surveillance: Secondary | ICD-10-CM | POA: Diagnosis not present

## 2014-12-24 DIAGNOSIS — E1165 Type 2 diabetes mellitus with hyperglycemia: Secondary | ICD-10-CM | POA: Insufficient documentation

## 2014-12-24 DIAGNOSIS — IMO0002 Reserved for concepts with insufficient information to code with codable children: Secondary | ICD-10-CM

## 2014-12-24 NOTE — Patient Instructions (Signed)
Continue choosing whole grains as much as possible. Continue being mindful about portions and when you are full. Continue walking/swimming most days of the week. Be cautious with portion sizes of desserts. Aim for 2-3 carb choices (30-45 grams) each meal. Aim for 1 carb choice for each snack when hungry. Protein in moderation with each meal and snack. Continue breakfast daily.

## 2014-12-24 NOTE — Progress Notes (Signed)
Diabetes Self-Management Education  Visit Type:  Follow-up  Appt. Start Time: 0830 Appt. End Time: 0915  12/24/2014  [name], identified by name and date of birth, is a 64 y.o. female with a diagnosis of Diabetes:     Patient is here alone.  Her sister has been here for the past month and this has negatively effected her diabetic care/choices.  Her sister is ten years older, has diabetes and requires insulin and drinks regular soda and snacks on "junk" all day long.  She states that her sister does not want to live.  Patient remains depressed, crying during visit.  Husband retiring in 2 weeks which stresses patient.  She has increased carbs from sugar throughout the month but has decreased overall portion sizes and made healthier food choices often.  She has lost 2 lbs since last visit.  She is taking her sister back to [place] today.  She has kept a detailed log of her blood pressure, blood sugar and food intake since last visit.    ASSESSMENT  Weight 183 lb (83.008 kg). Body mass index is 31.4 kg/(m^2).       Diabetes Self-Management Education - 12/24/14 0940    Psychosocial Assessment   Patient Belief/Attitude about Diabetes Other (comment)  Hard to manage when those around me are not caring for themselves.   Patient Concerns Nutrition/Meal planning;Weight Control   Preferred Learning Style No preference indicated   Learning Readiness Ready   Complications   How often do you check your blood sugar? 1-2 times/day   Fasting Blood glucose range (mg/dL) 70-129;130-179  120-170   Postprandial Blood glucose range (mg/dL) 70-129;>200  122-219   Number of hypoglycemic episodes per month 0   Number of hyperglycemic episodes per week 2   Dietary Intake   Breakfast [employer] Toast with 1/2 tsp margarine, 3 [place] sausage, 3 cubes melon   Lunch chinese buffet   Dinner [place] sandwich or hamburger, OR meat and veges and starch.   Snack (evening) handful of chips or cake or soda   Beverage(s) water, hot tea with sugar or splenda, occasional regular or diet soda   Exercise   Exercise Type Light (walking / raking leaves);Moderate (swimming / aerobic walking)   How many days per week to you exercise? 4   How many minutes per day do you exercise? 90   Total minutes per week of exercise 360   Light Exercise amount of time (min / week) 150   Patient Education   Previous Diabetes Education Yes (please comment)   Nutrition management  Food label reading, portion sizes and measuring food.;Carbohydrate counting   Physical activity and exercise  Role of exercise on diabetes management, blood pressure control and cardiac health.   Psychosocial adjustment Worked with patient to identify barriers to care and solutions   Individualized Goals (developed by patient)   Nutrition General guidelines for healthy choices and portions discussed;Follow meal plan discussed   Physical Activity Exercise 3-5 times per week;60 minutes per day   Monitoring  test my blood glucose as discussed  bid   Reducing Risk examine blood glucose patterns   Outcomes   Program Status Completed   Subsequent Visit   Since your last visit have you continued or begun to take your medications as prescribed? Yes   Since your last visit have you had your blood pressure checked? Yes   Is your most recent blood pressure lower, unchanged, or higher since your last visit? Lower   Since  your last visit have you experienced any weight changes? Loss   Weight Loss (lbs) 2   Since your last visit, are you checking your blood glucose at least once a day? Yes      Learning Objective:  Patient will have a greater understanding of diabetes self-management. Patient education plan is to attend individual and/or group sessions per assessed needs and concerns.   Plan:   Patient Instructions  Continue choosing whole grains as much as possible. Continue being mindful about portions and when you are full. Continue  walking/swimming most days of the week. Be cautious with portion sizes of desserts. Aim for 2-3 carb choices (30-45 grams) each meal. Aim for 1 carb choice for each snack when hungry. Protein in moderation with each meal and snack. Continue breakfast daily.   Expected Outcomes:  Demonstrated interest in learning. Expect positive outcomes  Education material provided: Diabetic magazine samples, Nutrition in the [employer].  If problems or questions, patient to contact team via:  Phone and Email  Future DSME appointment: - 4-6 wks

## 2015-01-28 ENCOUNTER — Encounter: Payer: 59 | Attending: Adult Health | Admitting: Dietician

## 2015-01-28 ENCOUNTER — Encounter: Payer: Self-pay | Admitting: Dietician

## 2015-01-28 VITALS — Wt 186.0 lb

## 2015-01-28 DIAGNOSIS — IMO0001 Reserved for inherently not codable concepts without codable children: Secondary | ICD-10-CM

## 2015-01-28 DIAGNOSIS — E1165 Type 2 diabetes mellitus with hyperglycemia: Secondary | ICD-10-CM | POA: Insufficient documentation

## 2015-01-28 DIAGNOSIS — Z713 Dietary counseling and surveillance: Secondary | ICD-10-CM | POA: Diagnosis not present

## 2015-01-28 NOTE — Patient Instructions (Signed)
Start an exercise habit.  [employer], silver sneakers, walking. Avoid drinking anything with sugar or carbohydrates. Resume your vitamin D. Before snacking ask, "Am I hungry?" Avoid eating in front of the TV.  Crotchet, draw, etc. Listen to your body.  "When am I full?" How do you feel when you make positive choices?

## 2015-01-28 NOTE — Progress Notes (Signed)
Diabetes Self-Management Education  Visit Type:  Follow-up  Appt. Start Time: 0815 Appt. End Time: 0845  01/28/2015  [name], identified by name and date of birth, is a 64 y.o. female with a diagnosis of Diabetes: Type 2  She states that over the past month her husband retired and they have been traveling.  His retirement has been less stressful than anticipated but she has not exercised since his retirement.  She has also stayed on regular soda since taking her sister home.  We discussed the importance of glucose control and the effects of excessive carbohydrate.  Weight has increased.  She snacks at times when not hungry due to habit.  Intuitive eating discussed and tips for motivation.  ASSESSMENT  Weight 186 lb (84.369 kg). Body mass index is 31.91 kg/(m^2).       Diabetes Self-Management Education - 01/28/15 1058    Health Coping   How would you rate your overall health? Fair   Psychosocial Assessment   Patient Belief/Attitude about Diabetes Defeat/Burnout   Self-care barriers None   Self-management support Doctor's office;Friends;Family  RD   Patient Concerns Nutrition/Meal planning;Weight Control   Special Needs None   Preferred Learning Style No preference indicated   Learning Readiness Contemplating   Complications   How often do you check your blood sugar? 1-2 times/day   Fasting Blood glucose range (mg/dL) 70-129   Postprandial Blood glucose range (mg/dL) 130-179   Number of hypoglycemic episodes per month 0   Number of hyperglycemic episodes per week 1   Exercise   How many days per week to you exercise? 0   How many minutes per day do you exercise? 0   Total minutes per week of exercise 0   Patient Education   Nutrition management  Food label reading, portion sizes and measuring food.   Physical activity and exercise  Role of exercise on diabetes management, blood pressure control and cardiac health.   Chronic complications Relationship between  chronic complications and blood glucose control   Psychosocial adjustment Worked with patient to identify barriers to care and solutions;Identified and addressed patients feelings and concerns about diabetes;Travel strategies   Personal strategies to promote health Lifestyle issues that need to be addressed for better diabetes care   Individualized Goals (developed by patient)   Nutrition General guidelines for healthy choices and portions discussed   Physical Activity Exercise 3-5 times per week;30 minutes per day   Monitoring  test my blood glucose as discussed   Reducing Risk examine blood glucose patterns   Patient Self-Evaluation of Goals - Patient rates self as meeting previously set goals (% of time)   Nutrition < 25%   Physical Activity < 25%   Medications Not Applicable   Monitoring >75%   Problem Solving 50 - 75 %   Reducing Risk 25 - 50%   Health Coping 25 - 50%   Outcomes   Program Status Completed   Subsequent Visit   Since your last visit have you continued or begun to take your medications as prescribed? Not on Medications   Since your last visit have you experienced any weight changes? Gain   Weight Gain (lbs) 3   Since your last visit, are you checking your blood glucose at least once a day? Yes      Learning Objective:  Patient will have a greater understanding of diabetes self-management. Patient education plan is to attend individual and/or group sessions per assessed needs and concerns.   Plan:  Patient Instructions  Start an exercise habit.  [employer], silver sneakers, walking. Avoid drinking anything with sugar or carbohydrates. Resume your vitamin D. Before snacking ask, "Am I hungry?" Avoid eating in front of the TV.  Crotchet, draw, etc. Listen to your body.  "When am I full?" How do you feel when you make positive choices?    Expected Outcomes:  Other (comment) (Demonstrated interest in learning but patient experiencing barriers to  change.)  Education material provided:   If problems or questions, patient to contact team via:  Phone and Email  Future DSME appointment: - 4-6 wks

## 2015-01-30 ENCOUNTER — Other Ambulatory Visit: Payer: Self-pay | Admitting: Adult Health

## 2015-02-16 ENCOUNTER — Encounter: Payer: Self-pay | Admitting: Family Medicine

## 2015-02-16 ENCOUNTER — Ambulatory Visit (INDEPENDENT_AMBULATORY_CARE_PROVIDER_SITE_OTHER): Payer: 59 | Admitting: Family Medicine

## 2015-02-16 VITALS — BP 130/90 | Temp 98.0°F | Wt 183.0 lb

## 2015-02-16 DIAGNOSIS — E1165 Type 2 diabetes mellitus with hyperglycemia: Secondary | ICD-10-CM

## 2015-02-16 DIAGNOSIS — IMO0001 Reserved for inherently not codable concepts without codable children: Secondary | ICD-10-CM

## 2015-02-16 DIAGNOSIS — I10 Essential (primary) hypertension: Secondary | ICD-10-CM | POA: Diagnosis not present

## 2015-02-16 LAB — BASIC METABOLIC PANEL
BUN: 13 mg/dL (ref 6–23)
CO2: 30 meq/L (ref 19–32)
CREATININE: 0.7 mg/dL (ref 0.40–1.20)
Calcium: 9 mg/dL (ref 8.4–10.5)
Chloride: 104 mEq/L (ref 96–112)
GFR: 89.93 mL/min (ref >60.00)
GLUCOSE: 112 mg/dL — AB (ref 70–99)
Potassium: 3.6 mEq/L (ref 3.5–5.1)
Sodium: 140 mEq/L (ref 135–145)

## 2015-02-16 LAB — HEMOGLOBIN A1C: Hgb A1c MFr Bld: 6.5 % (ref 4.6–6.5)

## 2015-02-16 NOTE — Patient Instructions (Signed)
Continue diet and exercise,,,,,,,,,,,,,, walk 30 minutes daily  Continue current medications  Labs today  We will call you the report in a couple days  Set up a time in January or February to get established with Almyra Free,,,,,,,,,,, our new adult nurse practitioner for your physical examination long-term care  It's been a pleasure being your doctor all these years,,,,,,,,, thank you,

## 2015-02-16 NOTE — Progress Notes (Signed)
Pre visit review using our clinic review tool, if applicable. No additional management support is needed unless otherwise documented below in the visit note. Lab Results  Component Value Date   HGBA1C 7.0* 11/09/2014   HGBA1C 6.9* 08/11/2014   HGBA1C 7.2* 05/13/2014   Lab Results  Component Value Date   MICROALBUR 0.9 05/13/2014   LDLCALC 86 05/13/2014   CREATININE 0.91 11/09/2014

## 2015-02-16 NOTE — Progress Notes (Signed)
   Subjective:    Patient ID: [name], female    DOB: [birth year], 64 y.o.   MRN: [id number]  HPI [name] is a 64 year old female married nonsmoker who comes in today for follow-up of diabetes and hypertension  Her last A1c 6 months ago was 6.9%. She monitors her blood sugar daily. It's averaging about 135-140 at home. She's been treating her elevated blood sugar with diet and exercise. She's lost 3 pounds since last spring. She continues to walk or do something 5 out of 7 days.  Her hypertension is been well-controlled on Norvasc 5 mg daily and Hyzaar 100-25 daily. BP today 130/90. BP at home 135/85 or less. Review of systems otherwise negative except due for physical exam in January   Review of Systems     review of systems negative Objective:   Physical Exam Well-developed well-nourished female no acute distress vital signs stable she's afebrile BP right arm sitting position 130/90 blood pressure at home 130/80       Assessment & Plan:Diabetes type 2 at goal with diet exercise and weight loss...Marland KitchenMarland KitchenMarland Kitchen  Diabetes type 2 at goal with diet exercise and weight loss......... continue that follow-up A1c CPX January  Hypertension at goal....... continue current therapy

## 2015-03-03 ENCOUNTER — Ambulatory Visit: Payer: 59 | Admitting: Dietician

## 2015-04-26 ENCOUNTER — Encounter: Payer: Self-pay | Admitting: Adult Health

## 2015-04-26 ENCOUNTER — Ambulatory Visit (INDEPENDENT_AMBULATORY_CARE_PROVIDER_SITE_OTHER): Payer: PRIVATE HEALTH INSURANCE | Admitting: Adult Health

## 2015-04-26 ENCOUNTER — Ambulatory Visit: Payer: Self-pay | Admitting: Adult Health

## 2015-04-26 VITALS — BP 110/70 | HR 90 | Temp 98.2°F | Ht 64.0 in | Wt 185.2 lb

## 2015-04-26 DIAGNOSIS — Z1211 Encounter for screening for malignant neoplasm of colon: Secondary | ICD-10-CM | POA: Diagnosis not present

## 2015-04-26 DIAGNOSIS — M25552 Pain in left hip: Secondary | ICD-10-CM

## 2015-04-26 DIAGNOSIS — M25551 Pain in right hip: Secondary | ICD-10-CM | POA: Diagnosis not present

## 2015-04-26 DIAGNOSIS — Z7689 Persons encountering health services in other specified circumstances: Secondary | ICD-10-CM

## 2015-04-26 DIAGNOSIS — Z7189 Other specified counseling: Secondary | ICD-10-CM | POA: Diagnosis not present

## 2015-04-26 DIAGNOSIS — IMO0001 Reserved for inherently not codable concepts without codable children: Secondary | ICD-10-CM

## 2015-04-26 DIAGNOSIS — E1165 Type 2 diabetes mellitus with hyperglycemia: Secondary | ICD-10-CM

## 2015-04-26 MED ORDER — DIAZEPAM 2 MG PO TABS: ORAL_TABLET | ORAL | Status: DC

## 2015-04-26 NOTE — Progress Notes (Signed)
HPI:  [name] is here to establish care. She is a pleasant caucasian female who  has a past medical history of HYPOTHYROIDISM (12/11/2006); DIABETES MELLITUS, TYPE II (12/11/2006); ANXIETY DEPRESSION (09/15/2007); VIRAL URI (09/16/2008); ASTHMA (05/15/2009); DIVERTICULOSIS, COLON (12/11/2006); Edema (01/15/2009); [id number]) (12/11/2006); PELVIC PAIN, CHRONIC (08/14/2007); and HYPERTENSION NEC (08/14/2007).  Last PCP and physical: 04/2014 - with MD Sherren Mocha  Immunizations:UTD Diet: Does try and follow a diabetic diet.  Exercise: She tries to exercise 3-5 times a week. Has not been exercising over the holidays.  Colonoscopy: 13 years ago  Dexa: Unknown - Does not want one.  Pap Smear: Had hysterectomy at 31  Mammogram: 05/26/2014 - Normal  Has the following chronic problems that require follow up and concerns today:  Diabetes - Her last A1c was  Lab Results  Component Value Date   HGBA1C 6.5 02/16/2015  She has not been checking as often as she is supposed to. The holidays were hard for her. She is diet controlled at this point.   Hypothyroidism - she feels as though this is well controlled with her current medication therapy.    ROS negative for unless reported above: fevers, chills,feeling poorly, unintentional weight loss, hearing or vision loss, chest pain, palpitations, leg claudication, struggling to breath,Not feeling congested in the chest, no orthopenia, no cough,no wheezing, normal appetite, no soft tissue swelling, no hemoptysis, melena, hematochezia, hematuria, falls, loc, si, or thoughts of self harm.   Past Medical History  Diagnosis Date  . HYPOTHYROIDISM 12/11/2006  . DIABETES MELLITUS, TYPE II 12/11/2006  . ANXIETY DEPRESSION 09/15/2007  . VIRAL URI 09/16/2008  . ASTHMA 05/15/2009  . DIVERTICULOSIS, COLON 12/11/2006  . Edema 01/15/2009  . Headache(784.0) 12/11/2006  . PELVIC PAIN, CHRONIC 08/14/2007  . HYPERTENSION NEC 08/14/2007    Past Surgical History   Procedure Laterality Date  . Abdominal hysterectomy    . Appendectomy      Family History  Problem Relation Age of Onset  . COPD Mother   . Diabetes Mother   . COPD Father     Social History   Social History  . Marital Status: Married    Spouse Name: N/A  . Number of Children: N/A  . Years of Education: N/A   Social History Main Topics  . Smoking status: Never Smoker   . Smokeless tobacco: Not on file  . Alcohol Use: Not on file  . Drug Use: Not on file  . Sexual Activity: Not on file   Other Topics Concern  . Not on file   Social History Narrative     Current outpatient prescriptions:  .  amLODipine (NORVASC) 5 MG tablet, TAKE 1 TABLET(5 MG) BY MOUTH DAILY, Disp: 90 tablet, Rfl: 1 .  aspirin 81 MG tablet, Take 81 mg by mouth at bedtime., Disp: , Rfl:  .  calcium carbonate 200 MG capsule, Take 250 mg by mouth 2 (two) times daily with a meal., Disp: , Rfl:  .  diazepam (VALIUM) 2 MG tablet, 1 by mouth each bedtime when necessary, Disp: 30 tablet, Rfl: 3 .  fish oil-omega-3 fatty acids 1000 MG capsule, Take 1 g by mouth 2 (two) times daily.  , Disp: , Rfl:  .  glucose blood (ONETOUCH VERIO) test strip, Test twice daily., Disp: 50 each, Rfl: 5 .  ibuprofen (ADVIL,MOTRIN) 600 MG tablet, , Disp: , Rfl:  .  levothyroxine (SYNTHROID, LEVOTHROID) 88 MCG tablet, TAKE 1 TABLET EVERY DAY, Disp: 90 tablet, Rfl: 3 .  losartan-hydrochlorothiazide (  HYZAAR) 100-25 MG per tablet, Take 1 tablet by mouth daily., Disp: 100 tablet, Rfl: 3 .  Multiple Vitamin (MULTIVITAMIN WITH MINERALS) TABS tablet, Take 1 tablet by mouth daily., Disp: , Rfl:  .  potassium chloride SA (K-DUR,KLOR-CON) 20 MEQ tablet, Take 1 tablet (20 mEq total) by mouth daily., Disp: 100 tablet, Rfl: 3 .  pramipexole (MIRAPEX) 1 MG tablet, TAKE 1 TABLET BY MOUTH EVERY DAY, Disp: 90 tablet, Rfl: 3 .  PROAIR HFA 108 (90 BASE) MCG/ACT inhaler, , Disp: , Rfl:  .  scopolamine (TRANSDERM-SCOP) 1 MG/3DAYS, Place 1 patch (1.5 mg  total) onto the skin every 3 (three) days., Disp: 4 patch, Rfl: 2 .  Vitamin D, Ergocalciferol, (DRISDOL) 50000 UNITS CAPS capsule, Take 50,000 Units by mouth every 7 (seven) days., Disp: , Rfl:   EXAM:  There were no vitals filed for this visit.  There is no weight on file to calculate BMI.   GENERAL: vitals reviewed and listed above, alert, oriented, appears well hydrated and in no acute distress  HEENT: atraumatic, conjunttiva clear, no obvious abnormalities on inspection of external nose and ears.  NECK: Neck is soft and supple without masses, no adenopathy or thyromegaly, trachea midline, no JVD. Normal range of motion.   LUNGS: clear to auscultation bilaterally, no wheezes, rales or rhonchi, good air movement  CV: Regular rate and rhythm, normal S1/S2, no audible murmurs, gallops, or rubs. Trace peripheral edema.   MS: moves all extremities without noticeable abnormality.   Abd: soft/nontender/nondistended/normal bowel sounds   Skin: warm and dry, no rash  Extremities: No clubbing, cyanosis, or edema. Capillary refill is WNL. Pulses intact bilaterally in upper and lower extremities.   Neuro: CN II-XII intact, sensation and reflexes normal throughout, 5/5 muscle strength in bilateral upper and lower extremities. Normal finger to nose. Normal rapid alternating movements. Normal romberg. No pronator drift.   PSYCH: pleasant and cooperative, no obvious depression or anxiety  ASSESSMENT AND PLAN:   1. Encounter to establish care - Follow up in at next available CPE appointment - Follow up sooner if needed - Stressed the importance of diet and exercise and that she needs to start exercising again and eating healthy  2. Hip pain, bilateral - diazepam (VALIUM) 2 MG tablet; 1 by mouth each bedtime when necessary  Dispense: 30 tablet; Refill: 0  3. Colon cancer screening - Ambulatory referral to Gastroenterology  4. Uncontrolled type 2 diabetes mellitus without complication,  without long-term current use of insulin ([place]) - Start monitoring blood sugars 3 times a week, bring log to next visit.  - Inform me if fasting blood sugars are above 160 at a consistent basis.    Discussed the following assessment and plan:  -We reviewed the PMH, PSH, FH, SH, Meds and Allergies. -We provided refills for any medications we will prescribe as needed. -We addressed current concerns per orders and patient instructions. -We have asked for records for pertinent exams, studies, vaccines and notes from previous providers. -We have advised patient to follow up per instructions below.   -Patient advised to return or notify a provider immediately if symptoms worsen or persist or new concerns arise.  [name], AGNP   Pre visit review using our clinic review tool, if applicable. No additional management support is needed unless otherwise documented below in the visit note.

## 2015-04-26 NOTE — Patient Instructions (Signed)
It was great seeing you again!  Please follow up with me for your physical.   If you need anything in the meantime, please let me know.   Someone from GI will call you to schedule your colonoscopy.

## 2015-04-28 ENCOUNTER — Encounter: Payer: Self-pay | Admitting: Internal Medicine

## 2015-05-17 ENCOUNTER — Ambulatory Visit (AMBULATORY_SURGERY_CENTER): Payer: Self-pay

## 2015-05-17 VITALS — Ht 64.0 in | Wt 186.4 lb

## 2015-05-17 DIAGNOSIS — Z1211 Encounter for screening for malignant neoplasm of colon: Secondary | ICD-10-CM

## 2015-05-17 MED ORDER — SUPREP BOWEL PREP KIT 17.5-3.13-1.6 GM/177ML PO SOLN
1.0000 | Freq: Once | ORAL | Status: DC
Start: 2015-05-17 — End: 2015-09-22

## 2015-05-17 NOTE — Progress Notes (Signed)
No allergies to eggs or soy No diet/weight loss meds No home oxygen No past problems with anesthesia  Has email and internet; registered for emmi

## 2015-05-22 ENCOUNTER — Other Ambulatory Visit: Payer: Self-pay | Admitting: Family Medicine

## 2015-05-28 ENCOUNTER — Other Ambulatory Visit: Payer: Self-pay | Admitting: Family Medicine

## 2015-05-31 ENCOUNTER — Encounter: Payer: PRIVATE HEALTH INSURANCE | Admitting: Internal Medicine

## 2015-06-16 ENCOUNTER — Other Ambulatory Visit (INDEPENDENT_AMBULATORY_CARE_PROVIDER_SITE_OTHER): Payer: PRIVATE HEALTH INSURANCE

## 2015-06-16 DIAGNOSIS — I10 Essential (primary) hypertension: Secondary | ICD-10-CM | POA: Diagnosis not present

## 2015-06-16 DIAGNOSIS — IMO0001 Reserved for inherently not codable concepts without codable children: Secondary | ICD-10-CM

## 2015-06-16 DIAGNOSIS — E1165 Type 2 diabetes mellitus with hyperglycemia: Secondary | ICD-10-CM | POA: Diagnosis not present

## 2015-06-16 LAB — CBC WITH DIFFERENTIAL/PLATELET
BASOS ABS: 0 10*3/uL (ref 0.0–0.1)
Basophils Relative: 0.4 % (ref 0.0–3.0)
EOS ABS: 0.1 10*3/uL (ref 0.0–0.7)
EOS PCT: 1.9 % (ref 0.0–5.0)
HCT: 39.7 % (ref 36.0–46.0)
HEMOGLOBIN: 13.4 g/dL (ref 12.0–15.0)
LYMPHS ABS: 1.5 10*3/uL (ref 0.7–4.0)
Lymphocytes Relative: 25.2 % (ref 12.0–46.0)
MCHC: 33.6 g/dL (ref 30.0–36.0)
MCV: 88.7 fl (ref 78.0–100.0)
MONO ABS: 0.4 10*3/uL (ref 0.1–1.0)
Monocytes Relative: 7 % (ref 3.0–12.0)
NEUTROS PCT: 65.5 % (ref 43.0–77.0)
Neutro Abs: 4 10*3/uL (ref 1.4–7.7)
Platelets: 230 10*3/uL (ref 150.0–400.0)
RBC: 4.48 Mil/uL (ref 3.87–5.11)
RDW: 14.1 % (ref 11.5–15.5)
WBC: 6.1 10*3/uL (ref 4.0–10.5)

## 2015-06-16 LAB — LIPID PANEL
Cholesterol: 161 mg/dL (ref 0–200)
HDL: 47.7 mg/dL (ref >39.00)
LDL Cholesterol: 82 mg/dL (ref 0–99)
NonHDL: 112.86
TRIGLYCERIDES: 154 mg/dL — AB (ref 0.0–149.0)
Total CHOL/HDL Ratio: 3
VLDL: 30.8 mg/dL (ref 0.0–40.0)

## 2015-06-16 LAB — BASIC METABOLIC PANEL
BUN: 14 mg/dL (ref 6–23)
CALCIUM: 9.3 mg/dL (ref 8.4–10.5)
CHLORIDE: 103 meq/L (ref 96–112)
CO2: 30 mEq/L (ref 19–32)
CREATININE: 0.79 mg/dL (ref 0.40–1.20)
GFR: 78.13 mL/min (ref >60.00)
Glucose, Bld: 140 mg/dL — ABNORMAL HIGH (ref 70–99)
Potassium: 3.5 mEq/L (ref 3.5–5.1)
Sodium: 139 mEq/L (ref 135–145)

## 2015-06-16 LAB — MICROALBUMIN / CREATININE URINE RATIO
CREATININE, U: 76.5 mg/dL
MICROALB/CREAT RATIO: 0.9 mg/g (ref 0.0–30.0)

## 2015-06-16 LAB — POCT URINALYSIS DIPSTICK
Bilirubin, UA: NEGATIVE
Blood, UA: NEGATIVE
GLUCOSE UA: NEGATIVE
Ketones, UA: NEGATIVE
LEUKOCYTES UA: NEGATIVE
NITRITE UA: NEGATIVE
PROTEIN UA: NEGATIVE
Spec Grav, UA: 1.02
UROBILINOGEN UA: 0.2
pH, UA: 7

## 2015-06-16 LAB — HEPATIC FUNCTION PANEL
ALBUMIN: 4 g/dL (ref 3.5–5.2)
ALT: 18 U/L (ref 0–35)
AST: 15 U/L (ref 0–37)
Alkaline Phosphatase: 72 U/L (ref 39–117)
Bilirubin, Direct: 0.1 mg/dL (ref 0.0–0.3)
Total Bilirubin: 0.7 mg/dL (ref 0.2–1.2)
Total Protein: 6.5 g/dL (ref 6.0–8.3)

## 2015-06-16 LAB — HEMOGLOBIN A1C: HEMOGLOBIN A1C: 7 % — AB (ref 4.6–6.5)

## 2015-06-16 LAB — TSH: TSH: 1.86 u[IU]/mL (ref 0.35–4.50)

## 2015-06-22 ENCOUNTER — Ambulatory Visit (INDEPENDENT_AMBULATORY_CARE_PROVIDER_SITE_OTHER): Payer: PRIVATE HEALTH INSURANCE | Admitting: Adult Health

## 2015-06-22 ENCOUNTER — Other Ambulatory Visit: Payer: Self-pay

## 2015-06-22 ENCOUNTER — Encounter: Payer: Self-pay | Admitting: Adult Health

## 2015-06-22 VITALS — BP 150/90 | Temp 98.4°F | Ht 64.0 in | Wt 184.5 lb

## 2015-06-22 DIAGNOSIS — E038 Other specified hypothyroidism: Secondary | ICD-10-CM | POA: Diagnosis not present

## 2015-06-22 DIAGNOSIS — Z1239 Encounter for other screening for malignant neoplasm of breast: Secondary | ICD-10-CM

## 2015-06-22 DIAGNOSIS — E1165 Type 2 diabetes mellitus with hyperglycemia: Secondary | ICD-10-CM

## 2015-06-22 DIAGNOSIS — R600 Localized edema: Secondary | ICD-10-CM

## 2015-06-22 DIAGNOSIS — I10 Essential (primary) hypertension: Secondary | ICD-10-CM | POA: Diagnosis not present

## 2015-06-22 DIAGNOSIS — Z0000 Encounter for general adult medical examination without abnormal findings: Secondary | ICD-10-CM

## 2015-06-22 DIAGNOSIS — Z1231 Encounter for screening mammogram for malignant neoplasm of breast: Secondary | ICD-10-CM

## 2015-06-22 DIAGNOSIS — IMO0001 Reserved for inherently not codable concepts without codable children: Secondary | ICD-10-CM

## 2015-06-22 MED ORDER — AMLODIPINE BESYLATE 10 MG PO TABS
10.0000 mg | ORAL_TABLET | Freq: Every day | ORAL | Status: DC
Start: 2015-06-22 — End: 2016-05-17

## 2015-06-22 MED ORDER — FUROSEMIDE 20 MG PO TABS: 20.0000 mg | ORAL_TABLET | Freq: Every day | ORAL | Status: DC

## 2015-06-22 NOTE — Progress Notes (Signed)
Subjective:    Patient ID: [name], female    DOB: 06-Jun-1950, 65 y.o.   MRN: [id number]  HPI  Patient presents for yearly preventative medicine examination.  All immunizations and health maintenance protocols were reviewed with the patient and needed orders were placed.  Appropriate screening laboratory values were ordered for the patient including screening of hyperlipidemia, renal function and hepatic function.  Medication reconciliation,  past medical history, social history, problem list and allergies were reviewed in detail with the patient  Goals were established with regard to weight loss, exercise, and  diet in compliance with medications  End of life planning was discussed.  She reports that her diet is suffering, she is eating out a lot during the week due to stress and work.   She is also not exercising.   She had to cancel her colonoscopy because her insurance company wanted $5000 up front.   We will set her up for her mammogram  She is having discord with her husband and feels as though most of the stress is coming from their relationship.    Review of Systems  Constitutional: Negative.   HENT: Negative.   Eyes: Negative.   Respiratory: Negative.   Cardiovascular: Negative.   Gastrointestinal: Negative.   Genitourinary: Negative.   Musculoskeletal: Negative.   Skin: Negative.        Has a " spot" on her right scapula that she would like looked at. It causes irritation from time to time   Allergic/Immunologic: Negative.   Neurological: Negative.   Hematological: Negative.   Psychiatric/Behavioral: Positive for agitation.   Past Medical History  Diagnosis Date  . HYPOTHYROIDISM 12/11/2006  . DIABETES MELLITUS, TYPE II 12/11/2006  . ANXIETY DEPRESSION 09/15/2007  . VIRAL URI 09/16/2008  . ASTHMA 05/15/2009  . DIVERTICULOSIS, COLON 12/11/2006  . Edema 01/15/2009  . Headache(784.0) 12/11/2006  . PELVIC PAIN, CHRONIC 08/14/2007  . HYPERTENSION NEC  08/14/2007    Social History   Social History  . Marital Status: Married    Spouse Name: N/A  . Number of Children: N/A  . Years of Education: N/A   Occupational History  . Not on file.   Social History Main Topics  . Smoking status: Never Smoker   . Smokeless tobacco: Never Used  . Alcohol Use: 0.0 oz/week    0 Standard drinks or equivalent per week     Comment: a glass of wine with dinner  . Drug Use: No  . Sexual Activity: Not on file   Other Topics Concern  . Not on file   Social History Narrative    Past Surgical History  Procedure Laterality Date  . Abdominal hysterectomy    . Appendectomy    . Bladder suspension      Family History  Problem Relation Age of Onset  . COPD Mother     smoker  . Diabetes Mother   . COPD Father     smoker  . Diabetes Father   . Diabetes Mellitus II Sister     x 5  . Colon cancer Neg Hx     Allergies  Allergen Reactions  . Benadryl [Diphenhydramine Hcl] Anaphylaxis  . Amoxicillin   . Aspirin   . Chlorphen-Phenyleph-Methscop     REACTION: rash  . Codeine   . Diphenhydramine Hcl     REACTION: swelling-mouth  . Hydrocodone   . Indomethacin   . Pentazocine Lactate     REACTION: passing out  . Promethazine Hcl   .  Quinine     REACTION: itching redface    Current Outpatient Prescriptions on File Prior to Visit  Medication Sig Dispense Refill  . amLODipine (NORVASC) 5 MG tablet TAKE 1 TABLET(5 MG) BY MOUTH DAILY 90 tablet 1  . aspirin 81 MG tablet Take 81 mg by mouth at bedtime.    . diazepam (VALIUM) 2 MG tablet 1 by mouth each bedtime when necessary 30 tablet 0  . glucose blood (ONETOUCH VERIO) test strip Test twice daily. (Patient taking differently: Three times a week.) 50 each 5  . ibuprofen (ADVIL,MOTRIN) 600 MG tablet     . levothyroxine (SYNTHROID, LEVOTHROID) 88 MCG tablet TAKE 1 TABLET BY MOUTH DAILY 240 tablet 0  . losartan-hydrochlorothiazide (HYZAAR) 100-25 MG per tablet Take 1 tablet by mouth daily.  100 tablet 3  . Multiple Vitamin (MULTIVITAMIN WITH MINERALS) TABS tablet Take 1 tablet by mouth daily. Reported on 04/26/2015    . potassium chloride SA (K-DUR,KLOR-CON) 20 MEQ tablet Take 1 tablet (20 mEq total) by mouth daily. 100 tablet 3  . pramipexole (MIRAPEX) 1 MG tablet TAKE 1 TABLET BY MOUTH DAILY 236 tablet 0  . SUPREP BOWEL PREP SOLN Take 1 kit by mouth once. 354 mL 0   No current facility-administered medications on file prior to visit.    Temp(Src) 98.4 F (36.9 C) (Oral)  Ht '5\' 4"'  (1.626 m)  Wt 184 lb 8 oz (83.689 kg)  BMI 31.65 kg/m2       Objective:   Physical Exam  Constitutional: She is oriented to person, place, and time. She appears well-developed and well-nourished. No distress.  HENT:  Head: Normocephalic and atraumatic.  Right Ear: External ear normal.  Left Ear: External ear normal.  Nose: Nose normal.  Mouth/Throat: Oropharynx is clear and moist. No oropharyngeal exudate.  Eyes: Conjunctivae and EOM are normal. Pupils are equal, round, and reactive to light. Right eye exhibits no discharge. Left eye exhibits no discharge. No scleral icterus.  Neck: Normal range of motion. Neck supple. No JVD present. No tracheal deviation present. No thyromegaly present.  Cardiovascular: Normal rate, regular rhythm, normal heart sounds and intact distal pulses.  Exam reveals no gallop and no friction rub.   No murmur heard. Pulmonary/Chest: Effort normal and breath sounds normal. No respiratory distress. She has no wheezes. She has no rales. She exhibits no tenderness.  Abdominal: Soft. Bowel sounds are normal. She exhibits no distension and no mass. There is no tenderness. There is no rebound and no guarding.  Genitourinary:  Breast Exam: No masses, lumps, dimpling, discharge  Musculoskeletal: Normal range of motion. She exhibits edema. She exhibits no tenderness.  +2 pitting edema in lower extremities  Lymphadenopathy:    She has no cervical adenopathy.  Neurological:  She is alert and oriented to person, place, and time. She has normal reflexes. No cranial nerve deficit. Coordination normal.  Skin: Skin is warm and dry. No rash noted. No erythema. No pallor.  Small benign appearing skin lesion on right scapula.   Psychiatric: She has a normal mood and affect. Her behavior is normal. Judgment and thought content normal.  Tearful at times during exam   Nursing note and vitals reviewed.     Assessment & Plan:  1. Routine general medical examination at a health care facility - Follow up in one year - Follow up sooner if needed - Stressed the importance of diet and exercise. She needs to stop eating out so much.  2. Essential hypertension - Not  controlled on current medication.  - Will increase Norvasc to 10 mg   3. Uncontrolled type 2 diabetes mellitus without complication, without long-term current use of insulin ([facility]) - her last A1c is 7.0  - Work on diet and exercise  4. Other specified hypothyroidism - No change in medication  5. Breast cancer screening - MM Digital Screening; Future  6. Bilateral edema of lower extremity - furosemide (LASIX) 20 MG tablet; Take 1 tablet (20 mg total) by mouth daily.  Dispense: 30 tablet; Refill: 0. Take every day of one week.

## 2015-06-22 NOTE — Progress Notes (Signed)
Pre visit review using our clinic review tool, if applicable. No additional management support is needed unless otherwise documented below in the visit note. 

## 2015-06-22 NOTE — Patient Instructions (Signed)
It was great seeing you again.   Your A1c is up a little bit. Work on diet and exercise. This will also help with the swelling in your legs.   Your blood pressure is elevated. Increase your Amlodipine to 10 mg. I will send in a new prescription.   Take the Lasix every other day for a week. Cut back on salt intake and drink more water.   Follow up with me in three months for diabetic recheck.   [facility] [address]  [place], [place] [phone]

## 2015-06-23 ENCOUNTER — Encounter: Payer: Self-pay | Admitting: Adult Health

## 2015-06-28 ENCOUNTER — Ambulatory Visit
Admission: RE | Admit: 2015-06-28 | Discharge: 2015-06-28 | Disposition: A | Discharge status: Home / Self Care | Payer: PRIVATE HEALTH INSURANCE | Source: Ambulatory Visit

## 2015-06-28 DIAGNOSIS — Z1231 Encounter for screening mammogram for malignant neoplasm of breast: Secondary | ICD-10-CM

## 2015-08-09 ENCOUNTER — Encounter: Payer: Self-pay | Admitting: Adult Health

## 2015-09-22 ENCOUNTER — Ambulatory Visit (INDEPENDENT_AMBULATORY_CARE_PROVIDER_SITE_OTHER): Payer: PRIVATE HEALTH INSURANCE | Admitting: Adult Health

## 2015-09-22 ENCOUNTER — Telehealth: Payer: Self-pay | Admitting: Adult Health

## 2015-09-22 VITALS — BP 170/82 | Temp 98.2°F | Ht 64.0 in | Wt 187.3 lb

## 2015-09-22 DIAGNOSIS — R600 Localized edema: Secondary | ICD-10-CM | POA: Diagnosis not present

## 2015-09-22 DIAGNOSIS — E1165 Type 2 diabetes mellitus with hyperglycemia: Secondary | ICD-10-CM | POA: Diagnosis not present

## 2015-09-22 DIAGNOSIS — I10 Essential (primary) hypertension: Secondary | ICD-10-CM | POA: Diagnosis not present

## 2015-09-22 DIAGNOSIS — IMO0001 Reserved for inherently not codable concepts without codable children: Secondary | ICD-10-CM

## 2015-09-22 LAB — BASIC METABOLIC PANEL
BUN: 16 mg/dL (ref 6–23)
CALCIUM: 9 mg/dL (ref 8.4–10.5)
CO2: 28 mEq/L (ref 19–32)
Chloride: 106 mEq/L (ref 96–112)
Creatinine, Ser: 0.76 mg/dL (ref 0.40–1.20)
GFR: 81.63 mL/min (ref >60.00)
GLUCOSE: 138 mg/dL — AB (ref 70–99)
POTASSIUM: 3.5 meq/L (ref 3.5–5.1)
SODIUM: 142 meq/L (ref 135–145)

## 2015-09-22 LAB — HEMOGLOBIN A1C: HEMOGLOBIN A1C: 6.9 % — AB (ref 4.6–6.5)

## 2015-09-22 NOTE — Patient Instructions (Signed)
It was great seeing you again today   I will follow up with you regarding your labs.   Get some compression socks to help with the swelling. Drink plenty of water, stay away from salt and elevate your legs at night.   Please let me know what test strips your insurance covers as well as what blood pressure medication is expensive.

## 2015-09-22 NOTE — Progress Notes (Signed)
Subjective:    Patient ID: [name], female    DOB: 12/22/1950, 65 y.o.   MRN: [id number]  HPI   65 year old female who presents to the office today for follow up regarding diabetes, hypertension, and lower extremity edema. She has not had her blood pressure medications in over a week. This is due to not having the time to go to the pharmacy for refills because her ill sister has recently started living with her and Nicole CellaDorothy is one of the main caregivers. She is trying to eat healthy and is eating a lot of fruits and vegetables. Unfortunately, she is not exercising and has not been to the gym in 2 weeks.   She has brought her log for BP and BS to this visit. Per her log, her blood pressures are running in between 110-160 systolic. Her blood sugars have been in the 130-150's. She reports that her test strips are costing her about 70 dollars a month.   She also reports that she feels as though her legs are swelling more than usual. She is taking her lasix but does not feel like it is making a difference. She is trying to stay away from high sodium foods but is not drinking a lot of water. She is not wearing compression socks and is not elevating legs at night.     Review of Systems  Constitutional: Negative.   Eyes: Negative.   Respiratory: Negative.   Cardiovascular: Positive for leg swelling. Negative for chest pain and palpitations.  Gastrointestinal: Negative.   Neurological: Positive for headaches. Negative for dizziness, weakness and light-headedness.  Hematological: Negative.   Psychiatric/Behavioral: Negative.   All other systems reviewed and are negative.  Past Medical History  Diagnosis Date  . HYPOTHYROIDISM 12/11/2006  . DIABETES MELLITUS, TYPE II 12/11/2006  . ANXIETY DEPRESSION 09/15/2007  . VIRAL URI 09/16/2008  . ASTHMA 05/15/2009  . DIVERTICULOSIS, COLON 12/11/2006  . Edema 01/15/2009  . Headache(784.0) 12/11/2006  . PELVIC PAIN, CHRONIC 08/14/2007  . HYPERTENSION  NEC 08/14/2007    Social History   Social History  . Marital Status: Married    Spouse Name: N/A  . Number of Children: N/A  . Years of Education: N/A   Occupational History  . Not on file.   Social History Main Topics  . Smoking status: Never Smoker   . Smokeless tobacco: Never Used  . Alcohol Use: 0.0 oz/week    0 Standard drinks or equivalent per week     Comment: a glass of wine with dinner  . Drug Use: No  . Sexual Activity: Not on file   Other Topics Concern  . Not on file   Social History Narrative    Past Surgical History  Procedure Laterality Date  . Abdominal hysterectomy    . Appendectomy    . Bladder suspension      Family History  Problem Relation Age of Onset  . COPD Mother     smoker  . Diabetes Mother   . COPD Father     smoker  . Diabetes Father   . Diabetes Mellitus II Sister     x 5  . Colon cancer Neg Hx     Allergies  Allergen Reactions  . Benadryl [Diphenhydramine Hcl] Anaphylaxis  . Ace Inhibitors Cough  . Amoxicillin   . Aspirin   . Chlorphen-Phenyleph-Methscop     REACTION: rash  . Codeine   . Diphenhydramine Hcl     REACTION: swelling-mouth  .  Hydrocodone   . Indomethacin   . Pentazocine Lactate     REACTION: passing out  . Promethazine Hcl   . Quinine     REACTION: itching redface    Current Outpatient Prescriptions on File Prior to Visit  Medication Sig Dispense Refill  . amLODipine (NORVASC) 10 MG tablet Take 1 tablet (10 mg total) by mouth daily. 90 tablet 2  . aspirin 81 MG tablet Take 81 mg by mouth at bedtime.    . Calcium Carbonate (CALCIUM 600 PO) Take 1 tablet by mouth daily.    . diazepam (VALIUM) 2 MG tablet 1 by mouth each bedtime when necessary 30 tablet 0  . furosemide (LASIX) 20 MG tablet Take 1 tablet (20 mg total) by mouth daily. 30 tablet 0  . glucose blood (ONETOUCH VERIO) test strip Test twice daily. (Patient taking differently: Three times a week.) 50 each 5  . ibuprofen (ADVIL,MOTRIN) 600 MG  tablet     . levothyroxine (SYNTHROID, LEVOTHROID) 88 MCG tablet TAKE 1 TABLET BY MOUTH DAILY 240 tablet 0  . losartan-hydrochlorothiazide (HYZAAR) 100-25 MG per tablet Take 1 tablet by mouth daily. 100 tablet 3  . Methylcellulose, Laxative, (FIBER THERAPY PO) Take by mouth.    . Multiple Vitamin (MULTIVITAMIN WITH MINERALS) TABS tablet Take 1 tablet by mouth daily. Reported on 04/26/2015    . Omega-3 Fatty Acids (FISH OIL TRIPLE STRENGTH) 1400 MG CAPS Take by mouth.    . pramipexole (MIRAPEX) 1 MG tablet TAKE 1 TABLET BY MOUTH DAILY 236 tablet 0  . potassium chloride SA (K-DUR,KLOR-CON) 20 MEQ tablet Take 1 tablet (20 mEq total) by mouth daily. (Patient not taking: Reported on 09/22/2015) 100 tablet 3   No current facility-administered medications on file prior to visit.    BP 170/82 mmHg  Temp(Src) 98.2 F (36.8 C) (Oral)  Ht 5\' 4"  (1.626 m)  Wt 187 lb 4.8 oz (84.959 kg)  BMI 32.13 kg/m2       Objective:   Physical Exam  Constitutional: She is oriented to person, place, and time. She appears well-developed and well-nourished. No distress.  Cardiovascular: Normal rate, regular rhythm, normal heart sounds and intact distal pulses.  Exam reveals no gallop and no friction rub.   No murmur heard. Pulmonary/Chest: Effort normal and breath sounds normal. No respiratory distress. She has no wheezes. She has no rales. She exhibits no tenderness.  Musculoskeletal: Normal range of motion. She exhibits edema. She exhibits no tenderness.  + 2 pitting edema in bilateral lower extremities  Neurological: She is alert and oriented to person, place, and time. She has normal reflexes. She displays normal reflexes. No cranial nerve deficit. She exhibits normal muscle tone. Coordination normal.  Skin: Skin is warm and dry. No rash noted. She is not diaphoretic. No erythema. No pallor.  Psychiatric: She has a normal mood and affect. Her behavior is normal. Judgment and thought content normal.  Nursing note  and vitals reviewed.      Assessment & Plan:  1. Essential hypertension - Near goal but not controlled.  - She needs to take her medication every day  - Consider adding additional agent.  - Basic metabolic panel - Hemoglobin A1c - Echocardiogram; Future - needs to exercise and eat healthy  - Follow up in 3 months or sooner if needed 2. Uncontrolled type 2 diabetes mellitus without complication, without long-term current use of insulin ([facility]) - Basic metabolic panel - Hemoglobin A1c - Consider adding Metformin  - needs to exercise  and eat healthy  3. Bilateral edema of lower extremity - Echocardiogram; Future - Compression socks  - Elevate legs above head - Consider changing lasix to Bumex if no improvement   [name], NP

## 2015-09-22 NOTE — Telephone Encounter (Signed)
Left VM to call back re: labs  

## 2015-09-23 ENCOUNTER — Other Ambulatory Visit: Payer: Self-pay | Admitting: Adult Health

## 2015-09-23 MED ORDER — METFORMIN HCL 500 MG PO TABS: 250.0000 mg | ORAL_TABLET | Freq: Two times a day (BID) | ORAL | Status: DC

## 2015-09-24 ENCOUNTER — Other Ambulatory Visit: Payer: Self-pay | Admitting: Family Medicine

## 2015-09-24 ENCOUNTER — Other Ambulatory Visit: Payer: Self-pay | Admitting: Adult Health

## 2015-10-11 ENCOUNTER — Other Ambulatory Visit (HOSPITAL_COMMUNITY): Payer: PRIVATE HEALTH INSURANCE

## 2015-11-01 ENCOUNTER — Telehealth: Payer: Self-pay | Admitting: *Deleted

## 2015-11-01 NOTE — Telephone Encounter (Signed)
Per Dalene Seltzer   Cancel Rsn: Patient (Cancel Per Mrs. D'Annunzio ( did not want to r/s))

## 2016-02-03 ENCOUNTER — Other Ambulatory Visit: Payer: Self-pay | Admitting: Adult Health

## 2016-02-09 ENCOUNTER — Other Ambulatory Visit: Payer: Self-pay

## 2016-02-09 MED ORDER — PRAMIPEXOLE DIHYDROCHLORIDE 1 MG PO TABS: 1.0000 mg | ORAL_TABLET | Freq: Every day | ORAL | 3 refills | Status: DC

## 2016-02-18 ENCOUNTER — Other Ambulatory Visit: Payer: Self-pay | Admitting: Adult Health

## 2016-03-01 ENCOUNTER — Other Ambulatory Visit: Payer: Self-pay | Admitting: Adult Health

## 2016-05-17 ENCOUNTER — Other Ambulatory Visit: Payer: Self-pay | Admitting: Adult Health

## 2016-05-17 DIAGNOSIS — I10 Essential (primary) hypertension: Secondary | ICD-10-CM

## 2016-05-17 NOTE — Telephone Encounter (Signed)
Ok to refill for one year  

## 2016-05-17 NOTE — Telephone Encounter (Signed)
Ok to refill 

## 2016-06-13 ENCOUNTER — Other Ambulatory Visit (INDEPENDENT_AMBULATORY_CARE_PROVIDER_SITE_OTHER): Payer: PRIVATE HEALTH INSURANCE

## 2016-06-13 DIAGNOSIS — Z0000 Encounter for general adult medical examination without abnormal findings: Secondary | ICD-10-CM

## 2016-06-13 LAB — LIPID PANEL
CHOLESTEROL: 153 mg/dL (ref 0–200)
HDL: 49.3 mg/dL (ref >39.00)
LDL CALC: 68 mg/dL (ref 0–99)
NonHDL: 103.92
TRIGLYCERIDES: 181 mg/dL — AB (ref 0.0–149.0)
Total CHOL/HDL Ratio: 3
VLDL: 36.2 mg/dL (ref 0.0–40.0)

## 2016-06-13 LAB — BASIC METABOLIC PANEL
BUN: 9 mg/dL (ref 6–23)
CO2: 29 meq/L (ref 19–32)
Calcium: 8.8 mg/dL (ref 8.4–10.5)
Chloride: 104 mEq/L (ref 96–112)
Creatinine, Ser: 0.73 mg/dL (ref 0.40–1.20)
GFR: 85.31 mL/min (ref >60.00)
Glucose, Bld: 142 mg/dL — ABNORMAL HIGH (ref 70–99)
POTASSIUM: 3.7 meq/L (ref 3.5–5.1)
SODIUM: 140 meq/L (ref 135–145)

## 2016-06-13 LAB — HEPATIC FUNCTION PANEL
ALT: 16 U/L (ref 0–35)
AST: 14 U/L (ref 0–37)
Albumin: 3.8 g/dL (ref 3.5–5.2)
Alkaline Phosphatase: 79 U/L (ref 39–117)
BILIRUBIN DIRECT: 0.1 mg/dL (ref 0.0–0.3)
BILIRUBIN TOTAL: 0.6 mg/dL (ref 0.2–1.2)
TOTAL PROTEIN: 6.4 g/dL (ref 6.0–8.3)

## 2016-06-13 LAB — MICROALBUMIN / CREATININE URINE RATIO
Creatinine,U: 52.2 mg/dL
Microalb Creat Ratio: 1.3 mg/g (ref 0.0–30.0)
Microalb, Ur: <0.7 mg/dL (ref 0.0–1.9)

## 2016-06-13 LAB — POC URINALSYSI DIPSTICK (AUTOMATED)
BILIRUBIN UA: NEGATIVE
Blood, UA: NEGATIVE
Glucose, UA: NEGATIVE
KETONES UA: NEGATIVE
LEUKOCYTES UA: NEGATIVE
Nitrite, UA: NEGATIVE
Protein, UA: NEGATIVE
SPEC GRAV UA: 1.02
Urobilinogen, UA: 0.2
pH, UA: 7.5

## 2016-06-13 LAB — CBC WITH DIFFERENTIAL/PLATELET
BASOS ABS: 0 10*3/uL (ref 0.0–0.1)
BASOS PCT: 0.6 % (ref 0.0–3.0)
EOS ABS: 0.1 10*3/uL (ref 0.0–0.7)
Eosinophils Relative: 1.8 % (ref 0.0–5.0)
HCT: 40.3 % (ref 36.0–46.0)
HEMOGLOBIN: 13.3 g/dL (ref 12.0–15.0)
Lymphocytes Relative: 21.3 % (ref 12.0–46.0)
Lymphs Abs: 1.3 10*3/uL (ref 0.7–4.0)
MCHC: 33.1 g/dL (ref 30.0–36.0)
MCV: 88.7 fl (ref 78.0–100.0)
MONOS PCT: 5.8 % (ref 3.0–12.0)
Monocytes Absolute: 0.4 10*3/uL (ref 0.1–1.0)
Neutro Abs: 4.5 10*3/uL (ref 1.4–7.7)
Neutrophils Relative %: 70.5 % (ref 43.0–77.0)
PLATELETS: 264 10*3/uL (ref 150.0–400.0)
RBC: 4.55 Mil/uL (ref 3.87–5.11)
RDW: 14.5 % (ref 11.5–15.5)
WBC: 6.3 10*3/uL (ref 4.0–10.5)

## 2016-06-13 LAB — HEMOGLOBIN A1C: Hgb A1c MFr Bld: 7.4 % — ABNORMAL HIGH (ref 4.6–6.5)

## 2016-06-13 LAB — TSH: TSH: 1.05 u[IU]/mL (ref 0.35–4.50)

## 2016-06-22 ENCOUNTER — Ambulatory Visit (INDEPENDENT_AMBULATORY_CARE_PROVIDER_SITE_OTHER): Payer: PRIVATE HEALTH INSURANCE | Admitting: Adult Health

## 2016-06-22 ENCOUNTER — Encounter: Payer: Self-pay | Admitting: Adult Health

## 2016-06-22 VITALS — BP 142/70 | Temp 98.6°F | Ht 64.0 in | Wt 186.6 lb

## 2016-06-22 DIAGNOSIS — M25552 Pain in left hip: Secondary | ICD-10-CM

## 2016-06-22 DIAGNOSIS — H1033 Unspecified acute conjunctivitis, bilateral: Secondary | ICD-10-CM | POA: Diagnosis not present

## 2016-06-22 DIAGNOSIS — E1165 Type 2 diabetes mellitus with hyperglycemia: Secondary | ICD-10-CM | POA: Diagnosis not present

## 2016-06-22 DIAGNOSIS — M25551 Pain in right hip: Secondary | ICD-10-CM | POA: Diagnosis not present

## 2016-06-22 DIAGNOSIS — E038 Other specified hypothyroidism: Secondary | ICD-10-CM

## 2016-06-22 DIAGNOSIS — H6690 Otitis media, unspecified, unspecified ear: Secondary | ICD-10-CM

## 2016-06-22 DIAGNOSIS — J069 Acute upper respiratory infection, unspecified: Secondary | ICD-10-CM

## 2016-06-22 DIAGNOSIS — Z0000 Encounter for general adult medical examination without abnormal findings: Secondary | ICD-10-CM

## 2016-06-22 DIAGNOSIS — IMO0001 Reserved for inherently not codable concepts without codable children: Secondary | ICD-10-CM

## 2016-06-22 DIAGNOSIS — R600 Localized edema: Secondary | ICD-10-CM

## 2016-06-22 DIAGNOSIS — Z1211 Encounter for screening for malignant neoplasm of colon: Secondary | ICD-10-CM | POA: Diagnosis not present

## 2016-06-22 DIAGNOSIS — I10 Essential (primary) hypertension: Secondary | ICD-10-CM | POA: Diagnosis not present

## 2016-06-22 MED ORDER — BENZONATATE 200 MG PO CAPS: 200.0000 mg | ORAL_CAPSULE | Freq: Three times a day (TID) | ORAL | 0 refills | Status: DC | PRN

## 2016-06-22 MED ORDER — ERYTHROMYCIN 5 MG/GM OP OINT: TOPICAL_OINTMENT | OPHTHALMIC | 0 refills | Status: DC

## 2016-06-22 MED ORDER — LOSARTAN POTASSIUM-HCTZ 100-25 MG PO TABS: 1.0000 | ORAL_TABLET | Freq: Every day | ORAL | 3 refills | Status: DC

## 2016-06-22 MED ORDER — FUROSEMIDE 20 MG PO TABS: 20.0000 mg | ORAL_TABLET | Freq: Every day | ORAL | 1 refills | Status: DC

## 2016-06-22 MED ORDER — AZITHROMYCIN 250 MG PO TABS
ORAL_TABLET | ORAL | 0 refills | Status: DC
Start: 2016-06-22 — End: 2016-07-24

## 2016-06-22 MED ORDER — DIAZEPAM 2 MG PO TABS: ORAL_TABLET | ORAL | 0 refills | Status: DC

## 2016-06-22 NOTE — Patient Instructions (Addendum)
It was great seeing you today   Your A1c is up, please take one whole pill of metformin ( 500mg ) two times a day. I want to follow up with you in 3 months to see how you are doing.   Someone from GI will call to schedule your colonoscopy   I have sent in Azithromycin for the ear infection and sinus infection  I have sent in a prescription for Erythromycin eye ointment for the pink eye   I have sent in a prescription for Tessalon pearls for the cough

## 2016-06-22 NOTE — Progress Notes (Signed)
Subjective:    Patient ID: [name], female    DOB: 12/01/1950, 66 y.o.   MRN: [id number]  HPI  Patient presents for yearly preventative medicine examination. She is a pleasant 66 year old female who  has a past medical history of ANXIETY DEPRESSION (09/15/2007); ASTHMA (05/15/2009); DIABETES MELLITUS, TYPE II (12/11/2006); DIVERTICULOSIS, COLON (12/11/2006); Edema (01/15/2009); YNWGNFAO(130.8) (12/11/2006); HYPERTENSION NEC (08/14/2007); HYPOTHYROIDISM (12/11/2006); and PELVIC PAIN, CHRONIC (08/14/2007).  All immunizations and health maintenance protocols were reviewed with the patient and needed orders were placed.  Medication reconciliation,  past medical history, social history, problem list and allergies were reviewed in detail with the patient  Goals were established with regard to weight loss, exercise, and  diet in compliance with medications. She does not follow a diabetic diet and does not exercise on a regular basis.   Wt Readings from Last 3 Encounters:  06/22/16 186 lb 9.6 oz (84.6 kg)  09/22/15 187 lb 4.8 oz (85 kg)  06/22/15 184 lb 8 oz (83.7 kg)      She takes Metformin 250 mg BID for diabetes. Her last A1c was 7.4.   She takes Hyzaar, Amlodipine, and Lasix for essential hypertension   She is on Synthroid 28mcg for hypothyroidism.   She has had her yearly mammogram done. She canceled her last appointment for a colonoscopy because her insurance would not pay for it. She has since changed insurance carriers. She has not seen her eye doctor this year yet.   She also have many acute complaints that she would like addressed today.   She reports that for the last four days he has had a sensation of "plugged ears". She reports that she feels as though her ears are very tender and she has had sharp pains. Denies any drainage. For the last week she has been experiencing sinus pain and pressure above and below her eyes. This has been accompanied by a dry cough and fever up to  101 last weekend.   Additionally, for the last four days she has been waking up with both of her eyes matted shut. She has had redness, itching, and drainage from bilateral eyes    Review of Systems  Constitutional: Positive for activity change, fatigue and fever.  HENT: Positive for congestion, ear pain, hearing loss, postnasal drip, sinus pain and sinus pressure. Negative for ear discharge and sore throat.   Eyes: Positive for pain, discharge, redness and itching. Negative for photophobia and visual disturbance.  Respiratory: Positive for cough. Negative for chest tightness, shortness of breath and wheezing.   Cardiovascular: Positive for leg swelling.  Gastrointestinal: Negative.   Endocrine: Negative.   Genitourinary: Negative.   Musculoskeletal: Negative.   Skin: Negative.   Allergic/Immunologic: Negative.   Neurological: Negative.   Hematological: Negative.   Psychiatric/Behavioral: Negative.   All other systems reviewed and are negative.  Past Medical History:  Diagnosis Date  . ANXIETY DEPRESSION 09/15/2007  . ASTHMA 05/15/2009  . DIABETES MELLITUS, TYPE II 12/11/2006  . DIVERTICULOSIS, COLON 12/11/2006  . Edema 01/15/2009  . Headache(784.0) 12/11/2006  . HYPERTENSION NEC 08/14/2007  . HYPOTHYROIDISM 12/11/2006  . PELVIC PAIN, CHRONIC 08/14/2007    Social History   Social History  . Marital status: Married    Spouse name: N/A  . Number of children: N/A  . Years of education: N/A   Occupational History  . Not on file.   Social History Main Topics  . Smoking status: Never Smoker  . Smokeless tobacco: Never Used  .  Alcohol use 0.0 oz/week     Comment: a glass of wine with dinner  . Drug use: No  . Sexual activity: Not on file   Other Topics Concern  . Not on file   Social History Narrative  . No narrative on file    Past Surgical History:  Procedure Laterality Date  . ABDOMINAL HYSTERECTOMY    . APPENDECTOMY    . Bladder Suspension      Family History    Problem Relation Age of Onset  . COPD Mother     smoker  . Diabetes Mother   . COPD Father     smoker  . Diabetes Father   . Diabetes Mellitus II Sister     x 5  . Colon cancer Neg Hx     Allergies  Allergen Reactions  . Benadryl [Diphenhydramine Hcl] Anaphylaxis  . Ace Inhibitors Cough  . Amoxicillin   . Aspirin   . Chlorphen-Phenyleph-Methscop     REACTION: rash  . Codeine   . Diphenhydramine Hcl     REACTION: swelling-mouth  . Hydrocodone   . Indomethacin   . Pentazocine Lactate     REACTION: passing out  . Promethazine Hcl   . Quinine     REACTION: itching redface    Current Outpatient Prescriptions on File Prior to Visit  Medication Sig Dispense Refill  . amLODipine (NORVASC) 10 MG tablet TAKE 1 TABLET(10 MG) BY MOUTH DAILY 90 tablet 3  . aspirin 81 MG tablet Take 81 mg by mouth at bedtime.    . Calcium Carbonate (CALCIUM 600 PO) Take 1 tablet by mouth daily.    . diazepam (VALIUM) 2 MG tablet 1 by mouth each bedtime when necessary 30 tablet 0  . furosemide (LASIX) 20 MG tablet Take 1 tablet (20 mg total) by mouth daily. 30 tablet 0  . glucose blood (ONETOUCH VERIO) test strip Test twice daily. (Patient taking differently: Three times a week.) 50 each 5  . ibuprofen (ADVIL,MOTRIN) 600 MG tablet     . levothyroxine (SYNTHROID, LEVOTHROID) 88 MCG tablet TAKE 1 TABLET BY MOUTH DAILY 90 tablet 3  . losartan-hydrochlorothiazide (HYZAAR) 100-25 MG tablet TAKE 1 TABLET BY MOUTH DAILY 370 tablet 0  . metFORMIN (GLUCOPHAGE) 500 MG tablet TAKE 1/2 TABLET(250 MG) BY MOUTH TWICE DAILY WITH A MEAL 90 tablet 3  . Methylcellulose, Laxative, (FIBER THERAPY PO) Take by mouth.    . Multiple Vitamin (MULTIVITAMIN WITH MINERALS) TABS tablet Take 1 tablet by mouth daily. Reported on 04/26/2015    . Omega-3 Fatty Acids (FISH OIL TRIPLE STRENGTH) 1400 MG CAPS Take by mouth.    . potassium chloride SA (K-DUR,KLOR-CON) 20 MEQ tablet TAKE 1 TABLET BY MOUTH DAILY 370 tablet 0  .  pramipexole (MIRAPEX) 1 MG tablet Take 1 tablet (1 mg total) by mouth daily. 236 tablet 3   No current facility-administered medications on file prior to visit.     BP (!) 142/70 (BP Location: Left Arm, Patient Position: Sitting, Cuff Size: Normal)   Temp 98.6 F (37 C) (Oral)   Ht 5\' 4"  (1.626 m)   Wt 186 lb 9.6 oz (84.6 kg)   BMI 32.03 kg/m       Objective:   Physical Exam  Constitutional: She is oriented to person, place, and time. She appears well-developed and well-nourished. No distress.  HENT:  Head: Normocephalic and atraumatic.  Right Ear: Hearing, external ear and ear canal normal. Tympanic membrane is erythematous and bulging.  Left Ear:  Hearing, external ear and ear canal normal. Tympanic membrane is erythematous and bulging.  Nose: Mucosal edema and rhinorrhea present. Right sinus exhibits frontal sinus tenderness. Right sinus exhibits no maxillary sinus tenderness. Left sinus exhibits frontal sinus tenderness. Left sinus exhibits no maxillary sinus tenderness.  Mouth/Throat: Uvula is midline, oropharynx is clear and moist and mucous membranes are normal. No oropharyngeal exudate.  Eyes: EOM are normal. Pupils are equal, round, and reactive to light. Lids are everted and swept, no foreign bodies found. Right eye exhibits discharge. Left eye exhibits discharge. Right conjunctiva is injected. Left conjunctiva is injected. No scleral icterus.  Neck: Normal range of motion. Neck supple. No JVD present. No tracheal deviation present. No thyromegaly present.  Cardiovascular: Normal rate, regular rhythm, normal heart sounds and intact distal pulses.  Exam reveals no gallop and no friction rub.   No murmur heard. Pulmonary/Chest: Effort normal and breath sounds normal. No stridor. No respiratory distress. She has no wheezes. She has no rales. She exhibits no tenderness.  Abdominal: Soft. Bowel sounds are normal. She exhibits no distension and no mass. There is no tenderness. There  is no rebound and no guarding.  Genitourinary:  Genitourinary Comments: Refused  Musculoskeletal: Normal range of motion. She exhibits edema (bilateral non pitting edema in lower extremities. R>L ). She exhibits no tenderness or deformity.  Lymphadenopathy:    She has no cervical adenopathy.  Neurological: She is alert and oriented to person, place, and time. She has normal reflexes. She displays normal reflexes. No cranial nerve deficit. She exhibits normal muscle tone. Coordination normal.  Skin: Skin is warm and dry. No rash noted. She is not diaphoretic. No erythema. No pallor.  Psychiatric: She has a normal mood and affect. Her behavior is normal. Judgment and thought content normal.  Nursing note and vitals reviewed.     Assessment & Plan:  1. Routine general medical examination at a health care facility - reviewed labs with patient. All questions answered - Needs to lose weight through diet and exercise - Follow up in one year or sooner if needed   2. Colon cancer screening - Ambulatory referral to Gastroenterology  3. Essential hypertension - Needs to work on diet and exercise. No change in therapy at this time.  - Will recheck in 3 months - Consider adding additional agent  BP Readings from Last 3 Encounters:  06/22/16 (!) 142/70  09/22/15 (!) 170/82  06/22/15 (!) 150/90     4. Other specified hypothyroidism - Stable. No change in therapy   5. Uncontrolled type 2 diabetes mellitus without complication, without long-term current use of insulin (HCC) - A1c has increased. Will increase metformin from 250 mg to 500mg  BID. She needs to start exercising on a regular basis and eating healthy.  - Recheck in 3 months   6. Acute bacterial conjunctivitis of both eyes  - erythromycin (ROMYCIN) ophthalmic ointment; Apply thin ribbon to affected eye(s) once daily for 7 days.  Dispense: 1 g; Refill: 0  7. Upper respiratory tract infection, unspecified type  - azithromycin  (ZITHROMAX Z-PAK) 250 MG tablet; Take 2 tablets on Day 1.  Then take 1 tablet daily.  Dispense: 6 tablet; Refill: 0 - benzonatate (TESSALON) 200 MG capsule; Take 1 capsule (200 mg total) by mouth 3 (three) times daily as needed for cough.  Dispense: 20 capsule; Refill: 0 - Follow up if no improvement  8. Acute otitis media, unspecified otitis media type  - azithromycin (ZITHROMAX Z-PAK) 250 MG tablet; Take  2 tablets on Day 1.  Then take 1 tablet daily.  Dispense: 6 tablet; Refill: 0 - Follow up if no improvement   9. Hip pain, bilateral  - diazepam (VALIUM) 2 MG tablet; 1 by mouth each bedtime when necessary  Dispense: 30 tablet; Refill: 0  10. Bilateral edema of lower extremity  - furosemide (LASIX) 20 MG tablet; Take 1 tablet (20 mg total) by mouth daily.  Dispense: 90 tablet; Refill: 1  Carol Peng, NP

## 2016-07-24 ENCOUNTER — Encounter: Payer: Self-pay | Admitting: Adult Health

## 2016-07-24 ENCOUNTER — Ambulatory Visit (INDEPENDENT_AMBULATORY_CARE_PROVIDER_SITE_OTHER)
Admission: RE | Admit: 2016-07-24 | Discharge: 2016-07-24 | Disposition: A | Discharge status: Home / Self Care | Payer: PRIVATE HEALTH INSURANCE | Source: Ambulatory Visit | Attending: Adult Health | Admitting: Adult Health

## 2016-07-24 ENCOUNTER — Ambulatory Visit (INDEPENDENT_AMBULATORY_CARE_PROVIDER_SITE_OTHER): Payer: PRIVATE HEALTH INSURANCE | Admitting: Adult Health

## 2016-07-24 VITALS — BP 154/89 | HR 76 | Temp 97.6°F | Wt 185.0 lb

## 2016-07-24 DIAGNOSIS — M25572 Pain in left ankle and joints of left foot: Secondary | ICD-10-CM

## 2016-07-24 NOTE — Progress Notes (Signed)
Pre visit review using our clinic review tool, if applicable. No additional management support is needed unless otherwise documented below in the visit note. 

## 2016-07-24 NOTE — Progress Notes (Signed)
Subjective:    Patient ID: [name], female    DOB: 1951-03-21, 66 y.o.   MRN: [id number]  HPI   66 year old female who  has a past medical history of ANXIETY DEPRESSION (09/15/2007); ASTHMA (05/15/2009); DIABETES MELLITUS, TYPE II (12/11/2006); DIVERTICULOSIS, COLON (12/11/2006); Edema (01/15/2009); LOVFIEPP(295.1) (12/11/2006); HYPERTENSION NEC (08/14/2007); HYPOTHYROIDISM (12/11/2006); and PELVIC PAIN, CHRONIC (08/14/2007). She presents to the office today for pain in her left ankle. She is in the clinic today for left ankle pain x 3 weeks. She reports " twisting her ankle while helping her sister into the car. She reports pain is " a burning pain" on the outside of her left ankle. Worse with weight baring. Denies any bruising. She has chronic swelling in her legs so she has found it hard to tell if she has any additional swelling.   She denies any loss of ROM  She has been using cold compresses and tylenol without relief.    Review of Systems See HPI   Past Medical History:  Diagnosis Date  . ANXIETY DEPRESSION 09/15/2007  . ASTHMA 05/15/2009  . DIABETES MELLITUS, TYPE II 12/11/2006  . DIVERTICULOSIS, COLON 12/11/2006  . Edema 01/15/2009  . Headache(784.0) 12/11/2006  . HYPERTENSION NEC 08/14/2007  . HYPOTHYROIDISM 12/11/2006  . PELVIC PAIN, CHRONIC 08/14/2007    Social History   Social History  . Marital status: Married    Spouse name: N/A  . Number of children: N/A  . Years of education: N/A   Occupational History  . Not on file.   Social History Main Topics  . Smoking status: Never Smoker  . Smokeless tobacco: Never Used  . Alcohol use 0.0 oz/week     Comment: a glass of wine with dinner  . Drug use: No  . Sexual activity: Not on file   Other Topics Concern  . Not on file   Social History Narrative  . No narrative on file    Past Surgical History:  Procedure Laterality Date  . ABDOMINAL HYSTERECTOMY    . APPENDECTOMY    . Bladder Suspension      Family  History  Problem Relation Age of Onset  . COPD Mother     smoker  . Diabetes Mother   . COPD Father     smoker  . Diabetes Father   . Diabetes Mellitus II Sister     x 5  . Colon cancer Neg Hx     Allergies  Allergen Reactions  . Benadryl [Diphenhydramine Hcl] Anaphylaxis  . Ace Inhibitors Cough  . Amoxicillin   . Aspirin   . Chlorphen-Phenyleph-Methscop     REACTION: rash  . Codeine   . Diphenhydramine Hcl     REACTION: swelling-mouth  . Fish Allergy   . Hydrocodone   . Indomethacin   . Pentazocine Lactate     REACTION: passing out  . Promethazine Hcl   . Quinine     REACTION: itching redface    Current Outpatient Prescriptions on File Prior to Visit  Medication Sig Dispense Refill  . amLODipine (NORVASC) 10 MG tablet TAKE 1 TABLET(10 MG) BY MOUTH DAILY 90 tablet 3  . aspirin 81 MG tablet Take 81 mg by mouth at bedtime.    . benzonatate (TESSALON) 200 MG capsule Take 1 capsule (200 mg total) by mouth 3 (three) times daily as needed for cough. 20 capsule 0  . Calcium Carbonate (CALCIUM 600 PO) Take 1 tablet by mouth daily.    . diazepam (  VALIUM) 2 MG tablet 1 by mouth each bedtime when necessary 30 tablet 0  . furosemide (LASIX) 20 MG tablet Take 1 tablet (20 mg total) by mouth daily. 90 tablet 1  . glucose blood (ONETOUCH VERIO) test strip Test twice daily. (Patient taking differently: Three times a week.) 50 each 5  . ibuprofen (ADVIL,MOTRIN) 600 MG tablet     . levothyroxine (SYNTHROID, LEVOTHROID) 88 MCG tablet TAKE 1 TABLET BY MOUTH DAILY 90 tablet 3  . losartan-hydrochlorothiazide (HYZAAR) 100-25 MG tablet Take 1 tablet by mouth daily. 90 tablet 3  . metFORMIN (GLUCOPHAGE) 500 MG tablet TAKE 1/2 TABLET(250 MG) BY MOUTH TWICE DAILY WITH A MEAL 90 tablet 3  . Methylcellulose, Laxative, (FIBER THERAPY PO) Take by mouth.    . Multiple Vitamin (MULTIVITAMIN WITH MINERALS) TABS tablet Take 1 tablet by mouth daily. Reported on 04/26/2015    . pramipexole (MIRAPEX) 1 MG  tablet Take 1 tablet (1 mg total) by mouth daily. 236 tablet 3  . potassium chloride SA (K-DUR,KLOR-CON) 20 MEQ tablet TAKE 1 TABLET BY MOUTH DAILY (Patient not taking: Reported on 07/24/2016) 370 tablet 0   No current facility-administered medications on file prior to visit.     BP (!) 154/89 (BP Location: Left Arm, Patient Position: Sitting, Cuff Size: Normal)   Pulse 76   Temp 97.6 F (36.4 C) (Oral)   Wt 185 lb (83.9 kg)   SpO2 97%   BMI 31.76 kg/m       Objective:   Physical Exam  Constitutional: She is oriented to person, place, and time. She appears well-developed and well-nourished. No distress.  Cardiovascular: Normal rate, regular rhythm, normal heart sounds and intact distal pulses.  Exam reveals no gallop and no friction rub.   No murmur heard. Pulmonary/Chest: Effort normal and breath sounds normal. No respiratory distress. She has no wheezes. She has no rales. She exhibits no tenderness.  Musculoskeletal: Normal range of motion. She exhibits tenderness. She exhibits no edema or deformity.  Tenderness along the cuneiform and lateral malleolus. No bruising or swelling noted. No pain with extension but has pain with flexion  Neurological: She is alert and oriented to person, place, and time.  Skin: Skin is warm and dry. No rash noted. She is not diaphoretic. No erythema. No pallor.  Psychiatric: She has a normal mood and affect. Her behavior is normal. Judgment and thought content normal.  Nursing note and vitals reviewed.     Assessment & Plan:  1. Acute left ankle pain - No apparent fracture but will get x ray to confirm.  - Compression sleeve placed on left ankle  - DG Foot 2 Views Left; Future - Ice, rest, and elevation   [employer]

## 2016-07-24 NOTE — Patient Instructions (Signed)
WE NOW OFFER    [facility] FAST TRACK!!!  SAME DAY Appointments for ACUTE CARE  Such as: Sprains, Injuries, cuts, abrasions, rashes, muscle pain, joint pain, back pain Colds, flu, sore throats, headache, allergies, cough, fever  Ear pain, sinus and eye infections Abdominal pain, nausea, vomiting, diarrhea, upset stomach Animal/insect bites  3 Easy Ways to Schedule: Walk-In Scheduling Call in scheduling Mychart Sign-up: [email or web address]         

## 2016-08-15 ENCOUNTER — Encounter: Payer: Self-pay | Admitting: Internal Medicine

## 2016-08-26 ENCOUNTER — Encounter: Payer: Self-pay | Admitting: Adult Health

## 2016-09-14 ENCOUNTER — Ambulatory Visit (INDEPENDENT_AMBULATORY_CARE_PROVIDER_SITE_OTHER): Payer: PRIVATE HEALTH INSURANCE | Admitting: Adult Health

## 2016-09-14 VITALS — BP 134/60 | Temp 98.2°F | Ht 64.0 in | Wt 190.8 lb

## 2016-09-14 DIAGNOSIS — R600 Localized edema: Secondary | ICD-10-CM | POA: Diagnosis not present

## 2016-09-14 MED ORDER — METFORMIN HCL 500 MG PO TABS: 500.0000 mg | ORAL_TABLET | Freq: Two times a day (BID) | ORAL | 3 refills | Status: DC

## 2016-09-14 NOTE — Progress Notes (Signed)
Subjective:    Patient ID: [name], female    DOB: [birth year], 66 y.o.   MRN: [id number]  HPI  66 year old female who presents to the office today with the complaint of worsening bilateral lower extremity edema. She is unable to place a time frame on when she noticed that her edema was worsening.   She has been taking medications as directed. She denies any calf pain, redness, or warmth  Denies CP or SOB  Review of Systems See HPI  Past Medical History:  Diagnosis Date  . ANXIETY DEPRESSION 09/15/2007  . ASTHMA 05/15/2009  . DIABETES MELLITUS, TYPE II 12/11/2006  . DIVERTICULOSIS, COLON 12/11/2006  . Edema 01/15/2009  . Headache(784.0) 12/11/2006  . HYPERTENSION NEC 08/14/2007  . HYPOTHYROIDISM 12/11/2006  . PELVIC PAIN, CHRONIC 08/14/2007    Social History   Social History  . Marital status: Married    Spouse name: N/A  . Number of children: N/A  . Years of education: N/A   Occupational History  . Not on file.   Social History Main Topics  . Smoking status: Never Smoker  . Smokeless tobacco: Never Used  . Alcohol use 0.0 oz/week     Comment: a glass of wine with dinner  . Drug use: No  . Sexual activity: Not on file   Other Topics Concern  . Not on file   Social History Narrative  . No narrative on file    Past Surgical History:  Procedure Laterality Date  . ABDOMINAL HYSTERECTOMY    . APPENDECTOMY    . Bladder Suspension      Family History  Problem Relation Age of Onset  . COPD Mother        smoker  . Diabetes Mother   . COPD Father        smoker  . Diabetes Father   . Diabetes Mellitus II Sister        x 5  . Colon cancer Neg Hx     Allergies  Allergen Reactions  . Benadryl [Diphenhydramine Hcl] Anaphylaxis  . Ace Inhibitors Cough  . Amoxicillin   . Aspirin   . Chlorphen-Phenyleph-Methscop     REACTION: rash  . Codeine   . Diphenhydramine Hcl     REACTION: swelling-mouth  . Fish Allergy   . Hydrocodone   . Indomethacin     . Pentazocine Lactate     REACTION: passing out  . Promethazine Hcl   . Quinine     REACTION: itching redface    Current Outpatient Prescriptions on File Prior to Visit  Medication Sig Dispense Refill  . amLODipine (NORVASC) 10 MG tablet TAKE 1 TABLET(10 MG) BY MOUTH DAILY 90 tablet 3  . aspirin 81 MG tablet Take 81 mg by mouth at bedtime.    . Calcium Carbonate (CALCIUM 600 PO) Take 1 tablet by mouth daily.    . diazepam (VALIUM) 2 MG tablet 1 by mouth each bedtime when necessary 30 tablet 0  . furosemide (LASIX) 20 MG tablet Take 1 tablet (20 mg total) by mouth daily. 90 tablet 1  . glucose blood (ONETOUCH VERIO) test strip Test twice daily. (Patient taking differently: Three times a week.) 50 each 5  . ibuprofen (ADVIL,MOTRIN) 600 MG tablet     . levothyroxine (SYNTHROID, LEVOTHROID) 88 MCG tablet TAKE 1 TABLET BY MOUTH DAILY 90 tablet 3  . losartan-hydrochlorothiazide (HYZAAR) 100-25 MG tablet Take 1 tablet by mouth daily. 90 tablet 3  . Methylcellulose, Laxative, (  FIBER THERAPY PO) Take by mouth.    . potassium chloride SA (K-DUR,KLOR-CON) 20 MEQ tablet TAKE 1 TABLET BY MOUTH DAILY 370 tablet 0  . pramipexole (MIRAPEX) 1 MG tablet Take 1 tablet (1 mg total) by mouth daily. 236 tablet 3   No current facility-administered medications on file prior to visit.     BP 134/60 (BP Location: Left Arm, Patient Position: Sitting, Cuff Size: Normal)   Temp 98.2 F (36.8 C) (Oral)   Ht 5\' 4"  (1.626 m)   Wt 190 lb 12.8 oz (86.5 kg)   BMI 32.75 kg/m       Objective:   Physical Exam  Constitutional: She is oriented to person, place, and time. She appears well-developed and well-nourished. No distress.  Cardiovascular: Normal rate, regular rhythm, normal heart sounds and intact distal pulses.  Exam reveals no gallop and no friction rub.   No murmur heard. Pulmonary/Chest: Effort normal and breath sounds normal. No respiratory distress. She has no wheezes. She has no rales. She exhibits  no tenderness.  Musculoskeletal: She exhibits edema and tenderness. She exhibits no deformity.  + 2 non pitting edema noted in bilateral legs.   Neurological: She is alert and oriented to person, place, and time.  Skin: Skin is warm and dry. No rash noted. She is not diaphoretic. No erythema.  Psychiatric: She has a normal mood and affect. Her behavior is normal. Judgment and thought content normal.  Nursing note and vitals reviewed.     Assessment & Plan:  1. Lower extremity edema - I am going to have her D/c Amlodipine and increase lasix from 20 mg to 40 mg x 2 days then back to 20 mg.  - Follow up in one week - Consider Echo   [name], NP

## 2016-09-25 ENCOUNTER — Ambulatory Visit: Payer: PRIVATE HEALTH INSURANCE | Admitting: Adult Health

## 2016-09-25 ENCOUNTER — Ambulatory Visit (INDEPENDENT_AMBULATORY_CARE_PROVIDER_SITE_OTHER): Payer: PRIVATE HEALTH INSURANCE | Admitting: Adult Health

## 2016-09-25 ENCOUNTER — Encounter: Payer: Self-pay | Admitting: Adult Health

## 2016-09-25 VITALS — BP 110/70 | Temp 98.6°F | Ht 64.0 in | Wt 188.6 lb

## 2016-09-25 DIAGNOSIS — R600 Localized edema: Secondary | ICD-10-CM | POA: Diagnosis not present

## 2016-09-25 DIAGNOSIS — E1165 Type 2 diabetes mellitus with hyperglycemia: Secondary | ICD-10-CM

## 2016-09-25 DIAGNOSIS — R5383 Other fatigue: Secondary | ICD-10-CM | POA: Diagnosis not present

## 2016-09-25 DIAGNOSIS — IMO0001 Reserved for inherently not codable concepts without codable children: Secondary | ICD-10-CM

## 2016-09-25 LAB — POCT GLYCOSYLATED HEMOGLOBIN (HGB A1C): Hemoglobin A1C: 6.9

## 2016-09-25 MED ORDER — ALBUTEROL SULFATE HFA 108 (90 BASE) MCG/ACT IN AERS: 2.0000 | INHALATION_SPRAY | Freq: Four times a day (QID) | RESPIRATORY_TRACT | 2 refills | Status: DC | PRN

## 2016-09-25 NOTE — Progress Notes (Signed)
Subjective:    Patient ID: [name], female    DOB: 11/28/1950, 66 y.o.   MRN: [id number]  HPI  66 year old female who presents to the office today for follow up regarding lower extremity edema. During her last visit I d/c amlodipine and increased lasix from 20 mg to 40 mg for two days and then she was to return to 20 mg.  She reports today that the edema has improved dramatically. She has been able to wear pants and shoes that she has not been able to wear due to the edema.    Her only other complaint is that of fatigue. She feels as though she has been having worsening fatigue. Her thyroid was checked 3 months ago and was normal but at this time she her A1c had increased. Metformin was increased to 500mg  BID. She is trying to eat healthy but is not exercising.   Review of Systems See HPI   Past Medical History:  Diagnosis Date  . ANXIETY DEPRESSION 09/15/2007  . ASTHMA 05/15/2009  . DIABETES MELLITUS, TYPE II 12/11/2006  . DIVERTICULOSIS, COLON 12/11/2006  . Edema 01/15/2009  . Headache(784.0) 12/11/2006  . HYPERTENSION NEC 08/14/2007  . HYPOTHYROIDISM 12/11/2006  . PELVIC PAIN, CHRONIC 08/14/2007    Social History   Social History  . Marital status: Married    Spouse name: N/A  . Number of children: N/A  . Years of education: N/A   Occupational History  . Not on file.   Social History Main Topics  . Smoking status: Never Smoker  . Smokeless tobacco: Never Used  . Alcohol use 0.0 oz/week     Comment: a glass of wine with dinner  . Drug use: No  . Sexual activity: Not on file   Other Topics Concern  . Not on file   Social History Narrative  . No narrative on file    Past Surgical History:  Procedure Laterality Date  . ABDOMINAL HYSTERECTOMY    . APPENDECTOMY    . Bladder Suspension      Family History  Problem Relation Age of Onset  . COPD Mother        smoker  . Diabetes Mother   . COPD Father        smoker  . Diabetes Father   . Diabetes  Mellitus II Sister        x 5  . Colon cancer Neg Hx     Allergies  Allergen Reactions  . Benadryl [Diphenhydramine Hcl] Anaphylaxis  . Ace Inhibitors Cough  . Amoxicillin   . Aspirin   . Chlorphen-Phenyleph-Methscop     REACTION: rash  . Codeine   . Diphenhydramine Hcl     REACTION: swelling-mouth  . Fish Allergy   . Hydrocodone   . Indomethacin   . Pentazocine Lactate     REACTION: passing out  . Promethazine Hcl   . Quinine     REACTION: itching redface    Current Outpatient Prescriptions on File Prior to Visit  Medication Sig Dispense Refill  . aspirin 81 MG tablet Take 81 mg by mouth at bedtime.    . Calcium Carbonate (CALCIUM 600 PO) Take 1 tablet by mouth daily.    . diazepam (VALIUM) 2 MG tablet 1 by mouth each bedtime when necessary 30 tablet 0  . furosemide (LASIX) 20 MG tablet Take 1 tablet (20 mg total) by mouth daily. 90 tablet 1  . glucose blood (ONETOUCH VERIO) test strip Test twice  daily. (Patient taking differently: Three times a week.) 50 each 5  . ibuprofen (ADVIL,MOTRIN) 600 MG tablet     . levothyroxine (SYNTHROID, LEVOTHROID) 88 MCG tablet TAKE 1 TABLET BY MOUTH DAILY 90 tablet 3  . losartan-hydrochlorothiazide (HYZAAR) 100-25 MG tablet Take 1 tablet by mouth daily. 90 tablet 3  . metFORMIN (GLUCOPHAGE) 500 MG tablet Take 1 tablet (500 mg total) by mouth 2 (two) times daily with a meal. 180 tablet 3  . Methylcellulose, Laxative, (FIBER THERAPY PO) Take by mouth.    . potassium chloride SA (K-DUR,KLOR-CON) 20 MEQ tablet TAKE 1 TABLET BY MOUTH DAILY 370 tablet 0  . pramipexole (MIRAPEX) 1 MG tablet Take 1 tablet (1 mg total) by mouth daily. 236 tablet 3   No current facility-administered medications on file prior to visit.     BP 110/70 (BP Location: Left Arm, Patient Position: Sitting, Cuff Size: Normal)   Temp 98.6 F (37 C) (Oral)   Ht 5\' 4"  (1.626 m)   Wt 188 lb 9.6 oz (85.5 kg)   BMI 32.37 kg/m       Objective:   Physical Exam    Constitutional: She is oriented to person, place, and time. She appears well-developed and well-nourished. No distress.  Cardiovascular: Normal rate, regular rhythm, normal heart sounds and intact distal pulses.  Exam reveals no gallop and no friction rub.   No murmur heard. Pulmonary/Chest: Effort normal and breath sounds normal. No respiratory distress. She has no wheezes. She has no rales. She exhibits no tenderness.  Musculoskeletal: Normal range of motion. She exhibits edema (no pitting). She exhibits no tenderness or deformity.  Neurological: She is alert and oriented to person, place, and time.  Skin: Skin is warm and dry. No rash noted. She is not diaphoretic. No erythema. No pallor.  Psychiatric: She has a normal mood and affect. Her behavior is normal. Judgment and thought content normal.  Nursing note and vitals reviewed.     Assessment & Plan:  1. Lower extremity edema - Has improved.  - Continue with current therapy  - Blood pressure is well controlled without Amlodipine   2. Uncontrolled type 2 diabetes mellitus without complication, without long-term current use of insulin (HCC)  - POC HgB A1c - 6.9 - improved   3. Fatigue, unspecified type - Possible related to inactivity. Advised to start exercising. Follow up if no improvement    [name], NP

## 2016-10-16 ENCOUNTER — Ambulatory Visit (AMBULATORY_SURGERY_CENTER): Payer: Self-pay

## 2016-10-16 VITALS — Ht 64.0 in | Wt 192.2 lb

## 2016-10-16 DIAGNOSIS — Z1211 Encounter for screening for malignant neoplasm of colon: Secondary | ICD-10-CM

## 2016-10-16 NOTE — Progress Notes (Signed)
No allergies to eggs or soy No diet meds No home oxygen No past problems with anesthesia  regisered emmi

## 2016-10-30 ENCOUNTER — Encounter: Payer: PRIVATE HEALTH INSURANCE | Admitting: Internal Medicine

## 2017-01-04 ENCOUNTER — Encounter: Payer: Self-pay | Admitting: Adult Health

## 2017-01-21 ENCOUNTER — Other Ambulatory Visit: Payer: Self-pay | Admitting: Adult Health

## 2017-01-21 DIAGNOSIS — Z1231 Encounter for screening mammogram for malignant neoplasm of breast: Secondary | ICD-10-CM

## 2017-01-23 ENCOUNTER — Ambulatory Visit: Payer: PRIVATE HEALTH INSURANCE

## 2017-01-23 ENCOUNTER — Encounter: Payer: Self-pay | Admitting: Adult Health

## 2017-01-23 ENCOUNTER — Ambulatory Visit (INDEPENDENT_AMBULATORY_CARE_PROVIDER_SITE_OTHER): Payer: PRIVATE HEALTH INSURANCE | Admitting: Adult Health

## 2017-01-23 VITALS — BP 124/60 | Temp 98.6°F | Wt 189.0 lb

## 2017-01-23 DIAGNOSIS — L259 Unspecified contact dermatitis, unspecified cause: Secondary | ICD-10-CM | POA: Diagnosis not present

## 2017-01-23 DIAGNOSIS — Z23 Encounter for immunization: Secondary | ICD-10-CM

## 2017-01-23 MED ORDER — TRIAMCINOLONE ACETONIDE 0.5 % EX OINT: 1.0000 "application " | TOPICAL_OINTMENT | Freq: Two times a day (BID) | CUTANEOUS | 1 refills | Status: DC

## 2017-01-23 NOTE — Progress Notes (Signed)
Subjective:    Patient ID: [name], female    DOB: 10-11-1950, 66 y.o.   MRN: [id number]  HPI  66 year old female who  has a past medical history of ANXIETY DEPRESSION (09/15/2007); ASTHMA (05/15/2009); DIABETES MELLITUS, TYPE II (12/11/2006); DIVERTICULOSIS, COLON (12/11/2006); Edema (01/15/2009); YQMVHQIO(962.9) (12/11/2006); HYPERTENSION NEC (08/14/2007); HYPOTHYROIDISM (12/11/2006); and PELVIC PAIN, CHRONIC (08/14/2007).   She presents to the office today for the acute complaint of rash on right leg. Rash is "itchy". She denies any pain. She has noticed redness but no warmth. Denies any trauma or known allergins.    Review of Systems See HPI   Past Medical History:  Diagnosis Date  . ANXIETY DEPRESSION 09/15/2007  . ASTHMA 05/15/2009  . DIABETES MELLITUS, TYPE II 12/11/2006  . DIVERTICULOSIS, COLON 12/11/2006  . Edema 01/15/2009  . Headache(784.0) 12/11/2006  . HYPERTENSION NEC 08/14/2007  . HYPOTHYROIDISM 12/11/2006  . PELVIC PAIN, CHRONIC 08/14/2007    Social History   Social History  . Marital status: Married    Spouse name: N/A  . Number of children: N/A  . Years of education: N/A   Occupational History  . Not on file.   Social History Main Topics  . Smoking status: Never Smoker  . Smokeless tobacco: Never Used  . Alcohol use No  . Drug use: No  . Sexual activity: Not on file   Other Topics Concern  . Not on file   Social History Narrative  . No narrative on file    Past Surgical History:  Procedure Laterality Date  . ABDOMINAL HYSTERECTOMY    . APPENDECTOMY    . Bladder Suspension      Family History  Problem Relation Age of Onset  . COPD Mother        smoker  . Diabetes Mother   . COPD Father        smoker  . Diabetes Father   . Diabetes Mellitus II Sister        x 5  . Colon cancer Neg Hx     Allergies  Allergen Reactions  . Benadryl [Diphenhydramine Hcl] Anaphylaxis  . Ace Inhibitors Cough  . Amoxicillin   . Aspirin   .  Chlorphen-Phenyleph-Methscop     REACTION: rash  . Codeine   . Diphenhydramine Hcl     REACTION: swelling-mouth  . Fish Allergy   . Hydrocodone   . Indomethacin   . Pentazocine Lactate     REACTION: passing out  . Promethazine Hcl   . Quinine     REACTION: itching redface    Current Outpatient Prescriptions on File Prior to Visit  Medication Sig Dispense Refill  . albuterol (PROVENTIL HFA;VENTOLIN HFA) 108 (90 Base) MCG/ACT inhaler Inhale 2 puffs into the lungs every 6 (six) hours as needed for wheezing or shortness of breath. 1 Inhaler 2  . aspirin 81 MG tablet Take 81 mg by mouth at bedtime.    . diazepam (VALIUM) 2 MG tablet 1 by mouth each bedtime when necessary 30 tablet 0  . furosemide (LASIX) 20 MG tablet Take 1 tablet (20 mg total) by mouth daily. 90 tablet 1  . glucose blood (ONETOUCH VERIO) test strip Test twice daily. (Patient taking differently: Three times a week.) 50 each 5  . ibuprofen (ADVIL,MOTRIN) 600 MG tablet     . levothyroxine (SYNTHROID, LEVOTHROID) 88 MCG tablet TAKE 1 TABLET BY MOUTH DAILY 90 tablet 3  . losartan-hydrochlorothiazide (HYZAAR) 100-25 MG tablet Take 1 tablet by mouth  daily. 90 tablet 3  . metFORMIN (GLUCOPHAGE) 500 MG tablet Take 1 tablet (500 mg total) by mouth 2 (two) times daily with a meal. 180 tablet 3  . pramipexole (MIRAPEX) 1 MG tablet Take 1 tablet (1 mg total) by mouth daily. 236 tablet 3   No current facility-administered medications on file prior to visit.     BP 124/60 (BP Location: Left Arm)   Temp 98.6 F (37 C) (Oral)   Wt 189 lb (85.7 kg)   BMI 32.44 kg/m       Objective:   Physical Exam  Constitutional: She is oriented to person, place, and time. She appears well-developed and well-nourished. No distress.  Neurological: She is alert and oriented to person, place, and time.  Skin: Skin is warm and dry. Rash noted. She is not diaphoretic. There is erythema. No pallor.  Slightly red macular rash on right shin. No  drainage or discharge noted. No warmth or pain. There are scratch marks present   Psychiatric: She has a normal mood and affect. Her behavior is normal. Judgment and thought content normal.  Nursing note and vitals reviewed.     Assessment & Plan:  1. Contact dermatitis, unspecified contact dermatitis type, unspecified trigger - triamcinolone ointment (KENALOG) 0.5 %; Apply 1 application topically 2 (two) times daily.  Dispense: 30 g; Refill: 1  2. Need for influenza vaccination  - Flu Vaccine QUAD 6+ mos PF IM (Fluarix Quad PF)   [name], NP

## 2017-05-28 ENCOUNTER — Encounter: Payer: Self-pay | Admitting: Adult Health

## 2017-05-28 ENCOUNTER — Ambulatory Visit (INDEPENDENT_AMBULATORY_CARE_PROVIDER_SITE_OTHER): Payer: PRIVATE HEALTH INSURANCE | Admitting: Adult Health

## 2017-05-28 VITALS — BP 160/80 | Temp 98.2°F | Wt 180.0 lb

## 2017-05-28 DIAGNOSIS — E1141 Type 2 diabetes mellitus with diabetic mononeuropathy: Secondary | ICD-10-CM | POA: Diagnosis not present

## 2017-05-28 DIAGNOSIS — E038 Other specified hypothyroidism: Secondary | ICD-10-CM

## 2017-05-28 LAB — POCT GLYCOSYLATED HEMOGLOBIN (HGB A1C): Hemoglobin A1C: 6.5

## 2017-05-28 MED ORDER — LEVOTHYROXINE SODIUM 88 MCG PO TABS: 88.0000 ug | ORAL_TABLET | Freq: Every day | ORAL | 0 refills | Status: DC

## 2017-05-28 NOTE — Progress Notes (Signed)
Subjective:    Patient ID: [name], female    DOB: 1950-05-29, 67 y.o.   MRN: [id number]  HPI  67 year old female who  has a past medical history of ANXIETY DEPRESSION (09/15/2007), ASTHMA (05/15/2009), DIABETES MELLITUS, TYPE II (12/11/2006), DIVERTICULOSIS, COLON (12/11/2006), Edema (01/15/2009), Headache(784.0) (12/11/2006), HYPERTENSION NEC (08/14/2007), HYPOTHYROIDISM (12/11/2006), and PELVIC PAIN, CHRONIC (08/14/2007).  She presents to the office today for numbness/tingling in bilateral lower extremities and a burning sensation on the bottom of her feet. She reports the numbness and tingling have been present for multiple months but became worse over the last two days  ( after standing at work for a long period of time). Numbness and tingling radiates just past ankles.   She does not check her blood sugars at home on a regular basis.   Denies any trauma or low back pain.    Review of Systems  See HPI   Past Medical History:  Diagnosis Date  . ANXIETY DEPRESSION 09/15/2007  . ASTHMA 05/15/2009  . DIABETES MELLITUS, TYPE II 12/11/2006  . DIVERTICULOSIS, COLON 12/11/2006  . Edema 01/15/2009  . Headache(784.0) 12/11/2006  . HYPERTENSION NEC 08/14/2007  . HYPOTHYROIDISM 12/11/2006  . PELVIC PAIN, CHRONIC 08/14/2007    Social History   Socioeconomic History  . Marital status: Married    Spouse name: Not on file  . Number of children: Not on file  . Years of education: Not on file  . Highest education level: Not on file  Social Needs  . Financial resource strain: Not on file  . Food insecurity - worry: Not on file  . Food insecurity - inability: Not on file  . Transportation needs - medical: Not on file  . Transportation needs - non-medical: Not on file  Occupational History  . Not on file  Tobacco Use  . Smoking status: Never Smoker  . Smokeless tobacco: Never Used  Substance and Sexual Activity  . Alcohol use: No    Alcohol/week: 0.0 oz  . Drug use: No  . Sexual  activity: Not on file  Other Topics Concern  . Not on file  Social History Narrative  . Not on file    Past Surgical History:  Procedure Laterality Date  . ABDOMINAL HYSTERECTOMY    . APPENDECTOMY    . Bladder Suspension      Family History  Problem Relation Age of Onset  . COPD Mother        smoker  . Diabetes Mother   . COPD Father        smoker  . Diabetes Father   . Diabetes Mellitus II Sister        x 5  . Colon cancer Neg Hx     Allergies  Allergen Reactions  . Benadryl [Diphenhydramine Hcl] Anaphylaxis  . Ace Inhibitors Cough  . Amoxicillin   . Aspirin   . Chlorphen-Phenyleph-Methscop     REACTION: rash  . Codeine   . Diphenhydramine Hcl     REACTION: swelling-mouth  . Fish Allergy   . Hydrocodone   . Indomethacin   . Pentazocine Lactate     REACTION: passing out  . Promethazine Hcl   . Quinine     REACTION: itching redface    Current Outpatient Medications on File Prior to Visit  Medication Sig Dispense Refill  . albuterol (PROVENTIL HFA;VENTOLIN HFA) 108 (90 Base) MCG/ACT inhaler Inhale 2 puffs into the lungs every 6 (six) hours as needed for wheezing or shortness of  breath. 1 Inhaler 2  . aspirin 81 MG tablet Take 81 mg by mouth at bedtime.    . Cholecalciferol (VITAMIN D PO) Take by mouth.    . diazepam (VALIUM) 2 MG tablet 1 by mouth each bedtime when necessary 30 tablet 0  . furosemide (LASIX) 20 MG tablet Take 1 tablet (20 mg total) by mouth daily. 90 tablet 1  . glucose blood (ONETOUCH VERIO) test strip Test twice daily. (Patient taking differently: Three times a week.) 50 each 5  . levothyroxine (SYNTHROID, LEVOTHROID) 88 MCG tablet TAKE 1 TABLET BY MOUTH DAILY 90 tablet 3  . losartan-hydrochlorothiazide (HYZAAR) 100-25 MG tablet Take 1 tablet by mouth daily. 90 tablet 3  . metFORMIN (GLUCOPHAGE) 500 MG tablet Take 1 tablet (500 mg total) by mouth 2 (two) times daily with a meal. 180 tablet 3  . pramipexole (MIRAPEX) 1 MG tablet Take 1  tablet (1 mg total) by mouth daily. 236 tablet 3  . triamcinolone ointment (KENALOG) 0.5 % Apply 1 application topically 2 (two) times daily. 30 g 1   No current facility-administered medications on file prior to visit.     BP (!) 160/80 (BP Location: Left Arm)   Temp 98.2 F (36.8 C) (Oral)   Wt 180 lb (81.6 kg)   BMI 30.90 kg/m       Objective:   Physical Exam  Constitutional: She is oriented to person, place, and time. She appears well-developed and well-nourished. No distress.  Cardiovascular: Normal rate, regular rhythm, normal heart sounds and intact distal pulses. Exam reveals no gallop and no friction rub.  No murmur heard. Pulmonary/Chest: Effort normal and breath sounds normal. No respiratory distress. She has no wheezes. She has no rales. She exhibits no tenderness.  Musculoskeletal: Normal range of motion. She exhibits edema (R>L ). She exhibits no tenderness or deformity.  Neurological: She is alert and oriented to person, place, and time.  Skin: Skin is warm and dry. No rash noted. She is not diaphoretic. No erythema. No pallor.  Psychiatric: She has a normal mood and affect. Her behavior is normal. Judgment and thought content normal.  Nursing note and vitals reviewed.     Assessment & Plan:  1. Diabetic mononeuropathy associated with type 2 diabetes mellitus ([place]) - Exam consistent with diabetic neuropathy. We spoke about treatment and causes. I would like her to start using compression socks. She is not taking her lasix on a daily basis and I advised to start taking as directed.  - Will follow up with at her CPE  - POCT A1C- 6.5   2. Other specified hypothyroidism  - levothyroxine (SYNTHROID, LEVOTHROID) 88 MCG tablet; Take 1 tablet (88 mcg total) by mouth daily.  Dispense: 90 tablet; Refill: 0  [name], NP

## 2017-06-13 ENCOUNTER — Ambulatory Visit (INDEPENDENT_AMBULATORY_CARE_PROVIDER_SITE_OTHER): Payer: PRIVATE HEALTH INSURANCE | Admitting: Adult Health

## 2017-06-13 ENCOUNTER — Encounter: Payer: Self-pay | Admitting: Adult Health

## 2017-06-13 VITALS — BP 130/74 | Temp 98.2°F | Ht 63.5 in | Wt 180.0 lb

## 2017-06-13 DIAGNOSIS — Z1159 Encounter for screening for other viral diseases: Secondary | ICD-10-CM

## 2017-06-13 DIAGNOSIS — E1165 Type 2 diabetes mellitus with hyperglycemia: Secondary | ICD-10-CM

## 2017-06-13 DIAGNOSIS — E038 Other specified hypothyroidism: Secondary | ICD-10-CM | POA: Diagnosis not present

## 2017-06-13 DIAGNOSIS — Z114 Encounter for screening for human immunodeficiency virus [HIV]: Secondary | ICD-10-CM

## 2017-06-13 DIAGNOSIS — I10 Essential (primary) hypertension: Secondary | ICD-10-CM

## 2017-06-13 DIAGNOSIS — Z0000 Encounter for general adult medical examination without abnormal findings: Secondary | ICD-10-CM

## 2017-06-13 DIAGNOSIS — Z1211 Encounter for screening for malignant neoplasm of colon: Secondary | ICD-10-CM

## 2017-06-13 LAB — CBC WITH DIFFERENTIAL/PLATELET
BASOS PCT: 0.6 % (ref 0.0–3.0)
Basophils Absolute: 0 10*3/uL (ref 0.0–0.1)
EOS PCT: 2.9 % (ref 0.0–5.0)
Eosinophils Absolute: 0.2 10*3/uL (ref 0.0–0.7)
HCT: 39.6 % (ref 36.0–46.0)
HEMOGLOBIN: 13.1 g/dL (ref 12.0–15.0)
LYMPHS PCT: 31.1 % (ref 12.0–46.0)
Lymphs Abs: 2 10*3/uL (ref 0.7–4.0)
MCHC: 33.2 g/dL (ref 30.0–36.0)
MCV: 89.5 fl (ref 78.0–100.0)
MONO ABS: 0.4 10*3/uL (ref 0.1–1.0)
Monocytes Relative: 5.7 % (ref 3.0–12.0)
Neutro Abs: 3.8 10*3/uL (ref 1.4–7.7)
Neutrophils Relative %: 59.7 % (ref 43.0–77.0)
Platelets: 251 10*3/uL (ref 150.0–400.0)
RBC: 4.42 Mil/uL (ref 3.87–5.11)
RDW: 14 % (ref 11.5–15.5)
WBC: 6.4 10*3/uL (ref 4.0–10.5)

## 2017-06-13 LAB — HEPATIC FUNCTION PANEL
ALT: 14 U/L (ref 0–35)
AST: 14 U/L (ref 0–37)
Albumin: 3.7 g/dL (ref 3.5–5.2)
Alkaline Phosphatase: 70 U/L (ref 39–117)
BILIRUBIN DIRECT: 0.1 mg/dL (ref 0.0–0.3)
TOTAL PROTEIN: 6.6 g/dL (ref 6.0–8.3)
Total Bilirubin: 0.9 mg/dL (ref 0.2–1.2)

## 2017-06-13 LAB — COMPREHENSIVE METABOLIC PANEL
ALBUMIN: 3.7 g/dL (ref 3.5–5.2)
ALT: 14 U/L (ref 0–35)
AST: 14 U/L (ref 0–37)
Alkaline Phosphatase: 70 U/L (ref 39–117)
BUN: 19 mg/dL (ref 6–23)
CALCIUM: 9.5 mg/dL (ref 8.4–10.5)
CHLORIDE: 102 meq/L (ref 96–112)
CO2: 30 mEq/L (ref 19–32)
Creatinine, Ser: 0.74 mg/dL (ref 0.40–1.20)
GFR: 83.72 mL/min (ref >60.00)
Glucose, Bld: 106 mg/dL — ABNORMAL HIGH (ref 70–99)
POTASSIUM: 3.9 meq/L (ref 3.5–5.1)
Sodium: 140 mEq/L (ref 135–145)
Total Bilirubin: 0.9 mg/dL (ref 0.2–1.2)
Total Protein: 6.6 g/dL (ref 6.0–8.3)

## 2017-06-13 LAB — LIPID PANEL
CHOL/HDL RATIO: 4
Cholesterol: 167 mg/dL (ref 0–200)
HDL: 45.5 mg/dL (ref >39.00)
LDL Cholesterol: 85 mg/dL (ref 0–99)
NONHDL: 121.63
Triglycerides: 184 mg/dL — ABNORMAL HIGH (ref 0.0–149.0)
VLDL: 36.8 mg/dL (ref 0.0–40.0)

## 2017-06-13 LAB — TSH: TSH: 2.2 u[IU]/mL (ref 0.35–4.50)

## 2017-06-13 NOTE — Progress Notes (Signed)
Subjective:    Patient ID: [name], female    DOB: [birth year], 67 y.o.   MRN: [id number]  HPI  Patient presents for yearly preventative medicine examination. She is a pleasant 67 year old female who  has a past medical history of ANXIETY DEPRESSION (09/15/2007), ASTHMA (05/15/2009), DIABETES MELLITUS, TYPE II (12/11/2006), DIVERTICULOSIS, COLON (12/11/2006), Edema (01/15/2009), Headache(784.0) (12/11/2006), HYPERTENSION NEC (08/14/2007), HYPOTHYROIDISM (12/11/2006), and PELVIC PAIN, CHRONIC (08/14/2007).  She takes synthroid 88 mcg for history of hypothyroidism   She takes Metformin 500 mg BID for DM II  Lab Results  Component Value Date   HGBA1C 6.5 05/28/2017   She takes Hyzaar 100-25 mg and lasix 20 mg  for hypertension  BP Readings from Last 3 Encounters:  06/13/17 130/74  05/28/17 (!) 160/80  01/23/17 124/60   Wt Readings from Last 3 Encounters:  06/13/17 180 lb (81.6 kg)  05/28/17 180 lb (81.6 kg)  01/23/17 189 lb (85.7 kg)    All immunizations and health maintenance protocols were reviewed with the patient and needed orders were placed. She is UTD on vaccinations   Appropriate screening laboratory values were ordered for the patient including screening of hyperlipidemia, renal function and hepatic function.  Medication reconciliation,  past medical history, social history, problem list and allergies were reviewed in detail with the patient  Goals were established with regard to weight loss, exercise, and  diet in compliance with medications. She is trying to eat healthy and is exercising on a regular basis   She is up to date on her mammogram and foot exam.  She is due for a colonoscopy   She has no acute complaints.  Review of Systems  Constitutional: Negative.   HENT: Negative.   Eyes: Negative.   Respiratory: Negative.   Cardiovascular: Negative.   Gastrointestinal: Negative.   Endocrine: Negative.   Genitourinary: Negative.   Musculoskeletal: Negative.     Skin: Negative.   Allergic/Immunologic: Negative.   Neurological: Negative.   Hematological: Negative.   Psychiatric/Behavioral: Negative.    Past Medical History:  Diagnosis Date  . ANXIETY DEPRESSION 09/15/2007  . ASTHMA 05/15/2009  . DIABETES MELLITUS, TYPE II 12/11/2006  . DIVERTICULOSIS, COLON 12/11/2006  . Edema 01/15/2009  . Headache(784.0) 12/11/2006  . HYPERTENSION NEC 08/14/2007  . HYPOTHYROIDISM 12/11/2006  . PELVIC PAIN, CHRONIC 08/14/2007    Social History   Socioeconomic History  . Marital status: Married    Spouse name: Not on file  . Number of children: Not on file  . Years of education: Not on file  . Highest education level: Not on file  Social Needs  . Financial resource strain: Not on file  . Food insecurity - worry: Not on file  . Food insecurity - inability: Not on file  . Transportation needs - medical: Not on file  . Transportation needs - non-medical: Not on file  Occupational History  . Not on file  Tobacco Use  . Smoking status: Never Smoker  . Smokeless tobacco: Never Used  Substance and Sexual Activity  . Alcohol use: No    Alcohol/week: 0.0 oz  . Drug use: No  . Sexual activity: Not on file  Other Topics Concern  . Not on file  Social History Narrative  . Not on file    Past Surgical History:  Procedure Laterality Date  . ABDOMINAL HYSTERECTOMY    . APPENDECTOMY    . Bladder Suspension      Family History  Problem Relation Age of Onset  .  COPD Mother        smoker  . Diabetes Mother   . COPD Father        smoker  . Diabetes Father   . Diabetes Mellitus II Sister        x 5  . Colon cancer Neg Hx     Allergies  Allergen Reactions  . Benadryl [Diphenhydramine Hcl] Anaphylaxis  . Ace Inhibitors Cough  . Amoxicillin   . Aspirin   . Chlorphen-Phenyleph-Methscop     REACTION: rash  . Codeine   . Diphenhydramine Hcl     REACTION: swelling-mouth  . Fish Allergy   . Hydrocodone   . Indomethacin   . Pentazocine Lactate      REACTION: passing out  . Promethazine Hcl   . Quinine     REACTION: itching redface    Current Outpatient Medications on File Prior to Visit  Medication Sig Dispense Refill  . albuterol (PROVENTIL HFA;VENTOLIN HFA) 108 (90 Base) MCG/ACT inhaler Inhale 2 puffs into the lungs every 6 (six) hours as needed for wheezing or shortness of breath. 1 Inhaler 2  . aspirin 81 MG tablet Take 81 mg by mouth at bedtime.    . Cholecalciferol (VITAMIN D PO) Take by mouth.    . diazepam (VALIUM) 2 MG tablet 1 by mouth each bedtime when necessary 30 tablet 0  . furosemide (LASIX) 20 MG tablet Take 1 tablet (20 mg total) by mouth daily. 90 tablet 1  . glucose blood (ONETOUCH VERIO) test strip Test twice daily. (Patient taking differently: Three times a week.) 50 each 5  . levothyroxine (SYNTHROID, LEVOTHROID) 88 MCG tablet Take 1 tablet (88 mcg total) by mouth daily. 90 tablet 0  . losartan-hydrochlorothiazide (HYZAAR) 100-25 MG tablet Take 1 tablet by mouth daily. 90 tablet 3  . metFORMIN (GLUCOPHAGE) 500 MG tablet Take 1 tablet (500 mg total) by mouth 2 (two) times daily with a meal. 180 tablet 3  . pramipexole (MIRAPEX) 1 MG tablet Take 1 tablet (1 mg total) by mouth daily. 236 tablet 3  . triamcinolone ointment (KENALOG) 0.5 % Apply 1 application topically 2 (two) times daily. 30 g 1   No current facility-administered medications on file prior to visit.     BP 130/74 (BP Location: Left Arm)   Temp 98.2 F (36.8 C) (Oral)   Ht 5' 3.5" (1.613 m)   Wt 180 lb (81.6 kg)   BMI 31.39 kg/m       Objective:   Physical Exam  Constitutional: She is oriented to person, place, and time. She appears well-developed and well-nourished. No distress.  Overweight    HENT:  Head: Normocephalic and atraumatic.  Right Ear: External ear normal.  Left Ear: External ear normal.  Nose: Nose normal.  Mouth/Throat: Oropharynx is clear and moist. No oropharyngeal exudate.  Eyes: Conjunctivae and EOM are normal.  Pupils are equal, round, and reactive to light. Right eye exhibits no discharge. Left eye exhibits no discharge. No scleral icterus.  Neck: Normal range of motion. Neck supple. No JVD present. No tracheal deviation present. No thyromegaly present.  Cardiovascular: Normal rate, regular rhythm, normal heart sounds and intact distal pulses. Exam reveals no gallop and no friction rub.  No murmur heard. Pulmonary/Chest: Effort normal and breath sounds normal. No stridor. No respiratory distress. She has no wheezes. She has no rales. She exhibits no tenderness.  Abdominal: Soft. Bowel sounds are normal. She exhibits no distension and no mass. There is no tenderness. There  is no rebound and no guarding.  Genitourinary:  Genitourinary Comments: Refused breast exam    Musculoskeletal: Normal range of motion. She exhibits no edema, tenderness or deformity.  Lymphadenopathy:    She has no cervical adenopathy.  Neurological: She is alert and oriented to person, place, and time. She has normal reflexes. She displays normal reflexes. No cranial nerve deficit. She exhibits normal muscle tone. Coordination normal.  Skin: Skin is warm and dry. No rash noted. She is not diaphoretic. No erythema. No pallor.  Psychiatric: She has a normal mood and affect. Her behavior is normal. Judgment and thought content normal.  Nursing note and vitals reviewed.     Assessment & Plan:  1. Routine general medical examination at a health care facility  - CBC with Differential/Platelet - Comprehensive metabolic panel - Hepatic function panel - Lipid panel - TSH  2. Encounter for screening for HIV  - HIV antibody  3. Need for hepatitis C screening test  - Hep C Antibody  4. Colon cancer screening  - Ambulatory referral to Gastroenterology  5. Essential hypertension - Near goal. No change in medication. Continue with life style modifications  - CBC with Differential/Platelet - Comprehensive metabolic panel -  Hepatic function panel - Lipid panel - TSH  6. Other specified hypothyroidism - Consider increase in Synthroid  - CBC with Differential/Platelet - Comprehensive metabolic panel - Hepatic function panel - Lipid panel - TSH  7. Uncontrolled type 2 diabetes mellitus with hyperglycemia (HCC) - Well controlled. No change in medications at this time  - CBC with Differential/Platelet - Comprehensive metabolic panel - Hepatic function panel - Lipid panel - TSH   [name], NP

## 2017-06-14 LAB — HEPATITIS C ANTIBODY
HEP C AB: NONREACTIVE
SIGNAL TO CUT-OFF: 0.01 (ref <1.00)

## 2017-07-10 ENCOUNTER — Other Ambulatory Visit: Payer: Self-pay | Admitting: Adult Health

## 2017-07-10 NOTE — Telephone Encounter (Signed)
Sent to the pharmacy by e-scribe. 

## 2017-07-11 ENCOUNTER — Encounter: Payer: Self-pay | Admitting: Internal Medicine

## 2017-07-12 ENCOUNTER — Ambulatory Visit
Admission: RE | Admit: 2017-07-12 | Discharge: 2017-07-12 | Disposition: A | Discharge status: Home / Self Care | Payer: Medicare Other | Source: Ambulatory Visit | Attending: Adult Health | Admitting: Adult Health

## 2017-07-12 DIAGNOSIS — Z1231 Encounter for screening mammogram for malignant neoplasm of breast: Secondary | ICD-10-CM

## 2017-08-28 ENCOUNTER — Encounter: Payer: Self-pay | Admitting: Adult Health

## 2017-08-28 ENCOUNTER — Other Ambulatory Visit: Payer: Self-pay | Admitting: Adult Health

## 2017-08-28 DIAGNOSIS — E038 Other specified hypothyroidism: Secondary | ICD-10-CM

## 2017-08-28 DIAGNOSIS — E1165 Type 2 diabetes mellitus with hyperglycemia: Secondary | ICD-10-CM

## 2017-08-28 MED ORDER — LEVOTHYROXINE SODIUM 88 MCG PO TABS: 88.0000 ug | ORAL_TABLET | Freq: Every day | ORAL | 2 refills | Status: DC

## 2017-08-28 MED ORDER — GLUCOSE BLOOD VI STRP: ORAL_STRIP | 5 refills | Status: DC

## 2017-08-28 NOTE — Telephone Encounter (Signed)
Sent to the pharmacy by e-scribe. 

## 2017-09-04 ENCOUNTER — Encounter: Payer: Self-pay | Admitting: Adult Health

## 2017-09-05 ENCOUNTER — Ambulatory Visit (INDEPENDENT_AMBULATORY_CARE_PROVIDER_SITE_OTHER): Payer: PRIVATE HEALTH INSURANCE | Admitting: Adult Health

## 2017-09-05 ENCOUNTER — Encounter: Payer: Self-pay | Admitting: Adult Health

## 2017-09-05 VITALS — BP 146/82 | HR 80 | Temp 98.5°F | Wt 186.4 lb

## 2017-09-05 DIAGNOSIS — M109 Gout, unspecified: Secondary | ICD-10-CM | POA: Diagnosis not present

## 2017-09-05 MED ORDER — PREDNISONE 20 MG PO TABS: 20.0000 mg | ORAL_TABLET | Freq: Every day | ORAL | 0 refills | Status: DC

## 2017-09-05 NOTE — Progress Notes (Signed)
Subjective:    Patient ID: [name], female    DOB: Jun 26, 1950, 67 y.o.   MRN: [id number]  HPI  67 year old female who  has a past medical history of ANXIETY DEPRESSION (09/15/2007), ASTHMA (05/15/2009), DIABETES MELLITUS, TYPE II (12/11/2006), DIVERTICULOSIS, COLON (12/11/2006), Edema (01/15/2009), Headache(784.0) (12/11/2006), HYPERTENSION NEC (08/14/2007), HYPOTHYROIDISM (12/11/2006), and PELVIC PAIN, CHRONIC (08/14/2007).  She presents to the office today for left foot pain x 1 week. Pain is located on left second toe. Reports swelling, redness an pain. She has pain when the bed sheets brush against her toe. Pain is described as constant and sharp. Has not used anything OTC for pain   Denies trauma  No history of gout   Review of Systems See HPI   Past Medical History:  Diagnosis Date  . ANXIETY DEPRESSION 09/15/2007  . ASTHMA 05/15/2009  . DIABETES MELLITUS, TYPE II 12/11/2006  . DIVERTICULOSIS, COLON 12/11/2006  . Edema 01/15/2009  . Headache(784.0) 12/11/2006  . HYPERTENSION NEC 08/14/2007  . HYPOTHYROIDISM 12/11/2006  . PELVIC PAIN, CHRONIC 08/14/2007    Social History   Socioeconomic History  . Marital status: Married    Spouse name: Not on file  . Number of children: Not on file  . Years of education: Not on file  . Highest education level: Not on file  Occupational History  . Not on file  Social Needs  . Financial resource strain: Not on file  . Food insecurity:    Worry: Not on file    Inability: Not on file  . Transportation needs:    Medical: Not on file    Non-medical: Not on file  Tobacco Use  . Smoking status: Never Smoker  . Smokeless tobacco: Never Used  Substance and Sexual Activity  . Alcohol use: No    Alcohol/week: 0.0 oz  . Drug use: No  . Sexual activity: Not on file  Lifestyle  . Physical activity:    Days per week: Not on file    Minutes per session: Not on file  . Stress: Not on file  Relationships  . Social connections:    Talks on  phone: Not on file    Gets together: Not on file    Attends religious service: Not on file    Active member of club or organization: Not on file    Attends meetings of clubs or organizations: Not on file    Relationship status: Not on file  . Intimate partner violence:    Fear of current or ex partner: Not on file    Emotionally abused: Not on file    Physically abused: Not on file    Forced sexual activity: Not on file  Other Topics Concern  . Not on file  Social History Narrative  . Not on file    Past Surgical History:  Procedure Laterality Date  . ABDOMINAL HYSTERECTOMY    . APPENDECTOMY    . Bladder Suspension      Family History  Problem Relation Age of Onset  . COPD Mother        smoker  . Diabetes Mother   . COPD Father        smoker  . Diabetes Father   . Diabetes Mellitus II Sister        x 5  . Colon cancer Neg Hx   . Breast cancer Neg Hx     Allergies  Allergen Reactions  . Benadryl [Diphenhydramine Hcl] Anaphylaxis  . Ace Inhibitors  Cough  . Amoxicillin   . Aspirin   . Chlorphen-Phenyleph-Methscop     REACTION: rash  . Codeine   . Diphenhydramine Hcl     REACTION: swelling-mouth  . Fish Allergy   . Hydrocodone   . Indomethacin   . Pentazocine Lactate     REACTION: passing out  . Promethazine Hcl   . Quinine     REACTION: itching redface    Current Outpatient Medications on File Prior to Visit  Medication Sig Dispense Refill  . albuterol (PROVENTIL HFA;VENTOLIN HFA) 108 (90 Base) MCG/ACT inhaler Inhale 2 puffs into the lungs every 6 (six) hours as needed for wheezing or shortness of breath. 1 Inhaler 2  . aspirin 81 MG tablet Take 81 mg by mouth at bedtime.    . Cholecalciferol (VITAMIN D PO) Take by mouth.    . diazepam (VALIUM) 2 MG tablet 1 by mouth each bedtime when necessary 30 tablet 0  . furosemide (LASIX) 20 MG tablet Take 1 tablet (20 mg total) by mouth daily. 90 tablet 1  . glucose blood (ONETOUCH VERIO) test strip Test twice  daily. 100 each 5  . levothyroxine (SYNTHROID, LEVOTHROID) 88 MCG tablet Take 1 tablet (88 mcg total) by mouth daily. 90 tablet 2  . losartan-hydrochlorothiazide (HYZAAR) 100-25 MG tablet TAKE 1 TABLET BY MOUTH DAILY 90 tablet 3  . metFORMIN (GLUCOPHAGE) 500 MG tablet Take 1 tablet (500 mg total) by mouth 2 (two) times daily with a meal. 180 tablet 3  . pramipexole (MIRAPEX) 1 MG tablet TAKE 1 TABLET BY MOUTH DAILY 90 tablet 2  . triamcinolone ointment (KENALOG) 0.5 % Apply 1 application topically 2 (two) times daily. 30 g 1   No current facility-administered medications on file prior to visit.     BP (!) 146/82 (BP Location: Left Arm, Patient Position: Sitting, Cuff Size: Normal)   Pulse 80   Temp 98.5 F (36.9 C) (Oral)   Wt 186 lb 6.4 oz (84.6 kg)   SpO2 96%   BMI 32.50 kg/m       Objective:   Physical Exam  Constitutional: She is oriented to person, place, and time. She appears well-developed and well-nourished. No distress.  Musculoskeletal: She exhibits edema and tenderness.  Redness, swelling, and warmth noted to left second toe. No streaking noted.   Neurological: She is alert and oriented to person, place, and time.  Skin: Skin is dry. No rash noted. She is not diaphoretic. There is erythema. No pallor.  Vitals reviewed.     Assessment & Plan:  1. Acute gout involving toe of left foot, unspecified cause - allergy to indomethacin. Will use prednisone for treatment. Advised to drink plenty of water to help keep blood sugar low.  - predniSONE (DELTASONE) 20 MG tablet; Take 1 tablet (20 mg total) by mouth daily with breakfast.  Dispense: 7 tablet; Refill: 0 - Follow up in 2-3 days if no improvement or sooner if symptoms become worse   [name], NP

## 2017-09-10 ENCOUNTER — Encounter: Payer: Self-pay | Admitting: Adult Health

## 2017-09-23 ENCOUNTER — Encounter: Payer: Self-pay | Admitting: Adult Health

## 2017-09-24 ENCOUNTER — Encounter: Payer: Self-pay | Admitting: Adult Health

## 2017-09-24 ENCOUNTER — Other Ambulatory Visit: Payer: Self-pay

## 2017-09-24 ENCOUNTER — Ambulatory Visit (AMBULATORY_SURGERY_CENTER): Payer: Self-pay

## 2017-09-24 ENCOUNTER — Telehealth: Payer: Self-pay | Admitting: Family Medicine

## 2017-09-24 ENCOUNTER — Telehealth: Payer: Self-pay | Admitting: Adult Health

## 2017-09-24 ENCOUNTER — Ambulatory Visit (INDEPENDENT_AMBULATORY_CARE_PROVIDER_SITE_OTHER): Payer: PRIVATE HEALTH INSURANCE | Admitting: Adult Health

## 2017-09-24 ENCOUNTER — Ambulatory Visit (INDEPENDENT_AMBULATORY_CARE_PROVIDER_SITE_OTHER)
Admission: RE | Admit: 2017-09-24 | Discharge: 2017-09-24 | Disposition: A | Discharge status: Home / Self Care | Payer: PRIVATE HEALTH INSURANCE | Source: Ambulatory Visit | Attending: Adult Health | Admitting: Adult Health

## 2017-09-24 VITALS — BP 160/80 | Temp 98.3°F | Wt 187.0 lb

## 2017-09-24 VITALS — Ht 64.0 in | Wt 182.7 lb

## 2017-09-24 DIAGNOSIS — S92512A Displaced fracture of proximal phalanx of left lesser toe(s), initial encounter for closed fracture: Secondary | ICD-10-CM | POA: Diagnosis not present

## 2017-09-24 DIAGNOSIS — Z1211 Encounter for screening for malignant neoplasm of colon: Secondary | ICD-10-CM

## 2017-09-24 DIAGNOSIS — M79672 Pain in left foot: Secondary | ICD-10-CM

## 2017-09-24 MED ORDER — NA SULFATE-K SULFATE-MG SULF 17.5-3.13-1.6 GM/177ML PO SOLN: 1.0000 | Freq: Once | ORAL | 0 refills | Status: AC

## 2017-09-24 NOTE — Telephone Encounter (Signed)
Informed that results have been received.

## 2017-09-24 NOTE — Telephone Encounter (Signed)
Copied from [place] [id number]. Topic: General - Other >> Sep 24, 2017  2:17 PM Yvette Rack wrote: Reason for CRM: Truman Hayward with [facility] called in asking if the impression of the left was was received. Lee requests a call back. Cb# [phone]

## 2017-09-24 NOTE — Telephone Encounter (Signed)
Spoke to patient and informed her of her xray   Xray shows Comminuted fracture midportion second proximal phalanx with slight lateral displacement of the distal major fracture fragment.   Will send her to orthopedics

## 2017-09-24 NOTE — Progress Notes (Signed)
Denies allergies to eggs or soy products. Denies complication of anesthesia or sedation. Denies use of weight loss medication. Denies use of O2.   Emmi instructions declined.  

## 2017-09-24 NOTE — Progress Notes (Signed)
Subjective:    Patient ID: [name], female    DOB: 02/08/1951, 67 y.o.   MRN: [id number]  HPI 67 year old female who  has a past medical history of Allergy, Anemia, Anxiety, ANXIETY DEPRESSION (09/15/2007), Arthritis, ASTHMA (05/15/2009), Asthma, DIABETES MELLITUS, TYPE II (12/11/2006), DIVERTICULOSIS, COLON (12/11/2006), Edema (01/15/2009), Gout, Headache(784.0) (12/11/2006), Hyperlipidemia, HYPERTENSION NEC (08/14/2007), HYPOTHYROIDISM (12/11/2006), PELVIC PAIN, CHRONIC (08/14/2007), and Restless leg syndrome.  She presents to the office today for follow up of pain of left second toe. I last saw her approx 3 weeks ago and she was prescribed a 7 day course of prednisone for assumed gout flare. She reports that the redness and pain has subsided but that she continues to have discomfort and swelling to the toe.   Review of Systems See HPI   Past Medical History:  Diagnosis Date  . Allergy   . Anemia   . Anxiety   . ANXIETY DEPRESSION 09/15/2007  . Arthritis   . ASTHMA 05/15/2009  . Asthma   . DIABETES MELLITUS, TYPE II 12/11/2006  . DIVERTICULOSIS, COLON 12/11/2006  . Edema 01/15/2009  . Gout   . Headache(784.0) 12/11/2006  . Hyperlipidemia   . HYPERTENSION NEC 08/14/2007  . HYPOTHYROIDISM 12/11/2006  . PELVIC PAIN, CHRONIC 08/14/2007  . Restless leg syndrome     Social History   Socioeconomic History  . Marital status: Married    Spouse name: Not on file  . Number of children: Not on file  . Years of education: Not on file  . Highest education level: Not on file  Occupational History  . Not on file  Social Needs  . Financial resource strain: Not on file  . Food insecurity:    Worry: Not on file    Inability: Not on file  . Transportation needs:    Medical: Not on file    Non-medical: Not on file  Tobacco Use  . Smoking status: Never Smoker  . Smokeless tobacco: Never Used  Substance and Sexual Activity  . Alcohol use: No    Alcohol/week: 0.0 oz  . Drug use: No  .  Sexual activity: Not on file  Lifestyle  . Physical activity:    Days per week: Not on file    Minutes per session: Not on file  . Stress: Not on file  Relationships  . Social connections:    Talks on phone: Not on file    Gets together: Not on file    Attends religious service: Not on file    Active member of club or organization: Not on file    Attends meetings of clubs or organizations: Not on file    Relationship status: Not on file  . Intimate partner violence:    Fear of current or ex partner: Not on file    Emotionally abused: Not on file    Physically abused: Not on file    Forced sexual activity: Not on file  Other Topics Concern  . Not on file  Social History Narrative  . Not on file    Past Surgical History:  Procedure Laterality Date  . ABDOMINAL HYSTERECTOMY    . APPENDECTOMY    . Bladder Suspension      Family History  Problem Relation Age of Onset  . COPD Mother        smoker  . Diabetes Mother   . COPD Father        smoker  . Diabetes Father   . Diabetes Mellitus  II Sister        x 5  . Colon cancer Neg Hx   . Breast cancer Neg Hx     Allergies  Allergen Reactions  . Benadryl [Diphenhydramine Hcl] Anaphylaxis  . Ace Inhibitors Cough  . Amoxicillin   . Aspirin   . Chlorphen-Phenyleph-Methscop     REACTION: rash  . Codeine   . Diphenhydramine Hcl     REACTION: swelling-mouth  . Fish Allergy   . Hydrocodone   . Indomethacin   . Pentazocine Lactate     REACTION: passing out  . Promethazine Hcl   . Quinine     REACTION: itching redface    Current Outpatient Medications on File Prior to Visit  Medication Sig Dispense Refill  . albuterol (PROVENTIL HFA;VENTOLIN HFA) 108 (90 Base) MCG/ACT inhaler Inhale 2 puffs into the lungs every 6 (six) hours as needed for wheezing or shortness of breath. 1 Inhaler 2  . aspirin 81 MG tablet Take 81 mg by mouth at bedtime.    . Cholecalciferol (VITAMIN D PO) Take by mouth.    . diazepam (VALIUM) 2 MG  tablet 1 by mouth each bedtime when necessary 30 tablet 0  . furosemide (LASIX) 20 MG tablet Take 1 tablet (20 mg total) by mouth daily. 90 tablet 1  . glucose blood (ONETOUCH VERIO) test strip Test twice daily. 100 each 5  . levothyroxine (SYNTHROID, LEVOTHROID) 88 MCG tablet Take 1 tablet (88 mcg total) by mouth daily. 90 tablet 2  . losartan-hydrochlorothiazide (HYZAAR) 100-25 MG tablet TAKE 1 TABLET BY MOUTH DAILY 90 tablet 3  . metFORMIN (GLUCOPHAGE) 500 MG tablet Take 1 tablet (500 mg total) by mouth 2 (two) times daily with a meal. 180 tablet 3  . Na Sulfate-K Sulfate-Mg Sulf 17.5-3.13-1.6 GM/177ML SOLN Take 1 kit by mouth once for 1 dose. Suprep as directed. No substitutions. 354 mL 0  . OVER THE COUNTER MEDICATION Fiber Capsule 2 capsules daily.    . pramipexole (MIRAPEX) 1 MG tablet TAKE 1 TABLET BY MOUTH DAILY 90 tablet 2  . triamcinolone ointment (KENALOG) 0.5 % Apply 1 application topically 2 (two) times daily. 30 g 1   No current facility-administered medications on file prior to visit.     BP (!) 160/80   Temp 98.3 F (36.8 C) (Oral)   Wt 187 lb (84.8 kg)   BMI 32.10 kg/m       Objective:   Physical Exam  Constitutional: She is oriented to person, place, and time. She appears well-developed and well-nourished. No distress.  Cardiovascular: Normal rate, regular rhythm, normal heart sounds and intact distal pulses. Exam reveals no gallop and no friction rub.  No murmur heard. Pulmonary/Chest: Effort normal and breath sounds normal. No stridor. No respiratory distress. She has no wheezes. She has no rales. She exhibits no tenderness.  Musculoskeletal: She exhibits edema and tenderness.  Noted edema to second left toe with pain with palpation to MTP joint. No redness or warmth noted.   Neurological: She is alert and oriented to person, place, and time.  Skin: Skin is warm and dry. Capillary refill takes less than 2 seconds. No rash noted. She is not diaphoretic. No  erythema.  Psychiatric: She has a normal mood and affect. Her behavior is normal. Judgment and thought content normal.  Nursing note and vitals reviewed.     Assessment & Plan:  1. Left foot pain - Does not appear as gout or infection at this time. Possible fracture?  -  DG Foot Complete Left; Future  [name], NP

## 2017-09-26 ENCOUNTER — Encounter (INDEPENDENT_AMBULATORY_CARE_PROVIDER_SITE_OTHER): Payer: Self-pay | Admitting: Orthopedic Surgery

## 2017-09-26 ENCOUNTER — Ambulatory Visit (INDEPENDENT_AMBULATORY_CARE_PROVIDER_SITE_OTHER): Payer: Medicare Other | Admitting: Orthopedic Surgery

## 2017-09-26 VITALS — Ht 64.0 in | Wt 187.0 lb

## 2017-09-26 DIAGNOSIS — S92502A Displaced unspecified fracture of left lesser toe(s), initial encounter for closed fracture: Secondary | ICD-10-CM

## 2017-09-26 NOTE — Progress Notes (Signed)
Office Visit Note   Patient: [name]           Date of Birth: 1950/10/04           MRN: [id number] Visit Date: 09/26/2017              Requested by: [name], NP [address], [place] [id number] PCP: [name], NP  Chief Complaint  Patient presents with  . Left Foot - Pain    2nd toe fx      HPI: Patient is a 67 year old woman with diabetic neuropathy she states her A1c runs around 7 she is not quite sure of the traumatic event but is been having pain and swelling left foot second toe.  Radiographs have been obtained which shows an oblique fracture of the proximal phalanx.  Assessment & Plan: Visit Diagnoses:  1. Fracture of second toe, left, closed, initial encounter     Plan: Recommended a stiff soled sneaker or she could use the [place] shoe that she has.  Injury was about 3 weeks ago she should expect an additional 3 weeks of symptoms.  Discussed that if she still has symptoms in 3 to 3 weeks to follow-up for evaluation.  Follow-Up Instructions: Return if symptoms worsen or fail to improve.   Ortho Exam  Patient is alert, oriented, no adenopathy, well-dressed, normal affect, normal respiratory effort. Examination patient has a good pulse she has good ankle and subtalar motion.  She has swelling of the second toe left foot.  This is tender to palpation.  Her radiographs were reviewed which shows an oblique fracture proximal phalanx left second toe.  She does have a long second metatarsal but no metatarsal stress fracture.  Imaging: No results found. No images are attached to the encounter.  Labs: Lab Results  Component Value Date   HGBA1C 6.5 05/28/2017   HGBA1C 6.9 09/25/2016   HGBA1C 7.4 (H) 06/13/2016     Lab Results  Component Value Date   ALBUMIN 3.7 06/13/2017   ALBUMIN 3.7 06/13/2017   ALBUMIN 3.8 06/13/2016    Body mass index is 32.1 kg/m.  Orders:  No orders of the defined types were placed in this  encounter.  No orders of the defined types were placed in this encounter.    Procedures: No procedures performed  Clinical Data: No additional findings.  ROS:  All other systems negative, except as noted in the HPI. Review of Systems  Objective: Vital Signs: Ht 5\' 4"  (1.626 m)   Wt 187 lb (84.8 kg)   BMI 32.10 kg/m   Specialty Comments:  No specialty comments available.  PMFS History: Patient Active Problem List   Diagnosis Date Noted  . Fracture of second toe, left, closed, initial encounter 09/26/2017  . GERD (gastroesophageal reflux disease) 10/19/2014  . Essential hypertension 08/16/2014  . Breast pain, right 05/20/2014  . Restless leg syndrome 05/20/2014  . Hip pain, bilateral 10/23/2011  . Knee pain, bilateral 10/23/2011  . EDEMA 01/15/2009  . Hypothyroidism 12/11/2006  . Diabetes type 2, uncontrolled ([place]) 12/11/2006  . DIVERTICULOSIS, COLON 12/11/2006  . HEADACHE 12/11/2006   Past Medical History:  Diagnosis Date  . Allergy   . Anemia   . Anxiety   . ANXIETY DEPRESSION 09/15/2007  . Arthritis   . ASTHMA 05/15/2009  . Asthma   . DIABETES MELLITUS, TYPE II 12/11/2006  . DIVERTICULOSIS, COLON 12/11/2006  . Edema 01/15/2009  . Gout   . Headache(784.0) 12/11/2006  . Hyperlipidemia   .  HYPERTENSION NEC 08/14/2007  . HYPOTHYROIDISM 12/11/2006  . PELVIC PAIN, CHRONIC 08/14/2007  . Restless leg syndrome     Family History  Problem Relation Age of Onset  . COPD Mother        smoker  . Diabetes Mother   . COPD Father        smoker  . Diabetes Father   . Diabetes Mellitus II Sister        x 5  . Colon cancer Neg Hx   . Breast cancer Neg Hx     Past Surgical History:  Procedure Laterality Date  . ABDOMINAL HYSTERECTOMY    . APPENDECTOMY    . Bladder Suspension     Social History   Occupational History  . Not on file  Tobacco Use  . Smoking status: Never Smoker  . Smokeless tobacco: Never Used  Substance and Sexual Activity  . Alcohol use: No     Alcohol/week: 0.0 oz  . Drug use: No  . Sexual activity: Not on file

## 2017-10-04 ENCOUNTER — Other Ambulatory Visit: Payer: Self-pay | Admitting: Adult Health

## 2017-10-04 NOTE — Telephone Encounter (Signed)
Sent to the pharmacy by e-scribe. 

## 2017-10-08 ENCOUNTER — Ambulatory Visit (AMBULATORY_SURGERY_CENTER): Payer: PRIVATE HEALTH INSURANCE | Admitting: Internal Medicine

## 2017-10-08 ENCOUNTER — Other Ambulatory Visit: Payer: Self-pay

## 2017-10-08 ENCOUNTER — Encounter: Payer: Self-pay | Admitting: Internal Medicine

## 2017-10-08 VITALS — BP 126/62 | HR 70 | Temp 96.9°F | Resp 13 | Ht 64.0 in | Wt 187.0 lb

## 2017-10-08 DIAGNOSIS — D123 Benign neoplasm of transverse colon: Secondary | ICD-10-CM | POA: Diagnosis not present

## 2017-10-08 DIAGNOSIS — R197 Diarrhea, unspecified: Secondary | ICD-10-CM

## 2017-10-08 DIAGNOSIS — D122 Benign neoplasm of ascending colon: Secondary | ICD-10-CM

## 2017-10-08 DIAGNOSIS — Z1211 Encounter for screening for malignant neoplasm of colon: Secondary | ICD-10-CM

## 2017-10-08 HISTORY — PX: COLONOSCOPY: SHX174

## 2017-10-08 MED ORDER — SODIUM CHLORIDE 0.9 % IV SOLN: 500.0000 mL | Freq: Once | INTRAVENOUS | Status: DC

## 2017-10-08 NOTE — Patient Instructions (Signed)
Handouts given : Polyps and Diverticulosis.  YOU HAD AN ENDOSCOPIC PROCEDURE TODAY AT THE [facility]:   Refer to the procedure report that was given to you for any specific questions about what was found during the examination.  If the procedure report does not answer your questions, please call your gastroenterologist to clarify.  If you requested that your care partner not be given the details of your procedure findings, then the procedure report has been included in a sealed envelope for you to review at your convenience later.  YOU SHOULD EXPECT: Some feelings of bloating in the abdomen. Passage of more gas than usual.  Walking can help get rid of the air that was put into your GI tract during the procedure and reduce the bloating. If you had a lower endoscopy (such as a colonoscopy or flexible sigmoidoscopy) you may notice spotting of blood in your stool or on the toilet paper. If you underwent a bowel prep for your procedure, you may not have a normal bowel movement for a few days.  Please Note:  You might notice some irritation and congestion in your nose or some drainage.  This is from the oxygen used during your procedure.  There is no need for concern and it should clear up in a day or so.  SYMPTOMS TO REPORT IMMEDIATELY:   Following lower endoscopy (colonoscopy or flexible sigmoidoscopy):  Excessive amounts of blood in the stool  Significant tenderness or worsening of abdominal pains  Swelling of the abdomen that is new, acute  Fever of 100F or higher    For urgent or emergent issues, a gastroenterologist can be reached at any hour by calling [phone].   DIET:  We do recommend a small meal at first, but then you may proceed to your regular diet.  Drink plenty of fluids but you should avoid alcoholic beverages for 24 hours.  ACTIVITY:  You should plan to take it easy for the rest of today and you should NOT DRIVE or use heavy machinery until tomorrow (because of  the sedation medicines used during the test).    FOLLOW UP: Our staff will call the number listed on your records the next business day following your procedure to check on you and address any questions or concerns that you may have regarding the information given to you following your procedure. If we do not reach you, we will leave a message.  However, if you are feeling well and you are not experiencing any problems, there is no need to return our call.  We will assume that you have returned to your regular daily activities without incident.  If any biopsies were taken you will be contacted by phone or by letter within the next 1-3 weeks.  Please call us at [phone] if you have not heard about the biopsies in 3 weeks.    SIGNATURES/CONFIDENTIALITY: You and/or your care partner have signed paperwork which will be entered into your electronic medical record.  These signatures attest to the fact that that the information above on your After Visit Summary has been reviewed and is understood.  Full responsibility of the confidentiality of this discharge information lies with you and/or your care-partner. 

## 2017-10-08 NOTE — Progress Notes (Signed)
Spontaneous respirations throughout. VSS. Resting comfortably. To PACU on room air. Report to  RN. 

## 2017-10-08 NOTE — Op Note (Signed)
[name] Procedure Date: 10/08/2017 8:08 AM MRN: [id number] Endoscopist: Docia Chuck. Henrene Pastor , MD Age: 67 Referring MD:  Date of Birth: 04-19-1950 Gender: Female Account #: [id number] Procedure:                Colonoscopy, With cold snare polypectomy, with cold                            biopsy, and random biopsies Indications:              Screening for colorectal malignant neoplasm,                            Incidental diarrhea noted Medicines:                Monitored Anesthesia Care Procedure:                Pre-Anesthesia Assessment:                           - Prior to the procedure, a History and Physical                            was performed, and patient medications and                            allergies were reviewed. The patient's tolerance of                            previous anesthesia was also reviewed. The risks                            and benefits of the procedure and the sedation                            options and risks were discussed with the patient.                            All questions were answered, and informed consent                            was obtained. Prior Anticoagulants: The patient has                            taken no previous anticoagulant or antiplatelet                            agents. ASA Grade Assessment: II - A patient with                            mild systemic disease. After reviewing the risks                            and benefits, the patient was deemed in  satisfactory condition to undergo the procedure.                           After obtaining informed consent, the colonoscope                            was passed under direct vision. Throughout the                            procedure, the patient's blood pressure, pulse, and                            oxygen saturations were monitored continuously. The                            Colonoscope was  introduced through the anus and                            advanced to the the cecum, identified by                            appendiceal orifice and ileocecal valve. The                            ileocecal valve, appendiceal orifice, and rectum                            were photographed. The quality of the bowel                            preparation was excellent. The colonoscopy was                            performed without difficulty. The patient tolerated                            the procedure well. The bowel preparation used was                            SUPREP. Scope In: 8:16:43 AM Scope Out: 8:34:09 AM Scope Withdrawal Time: 0 hours 10 minutes 30 seconds  Total Procedure Duration: 0 hours 17 minutes 26 seconds  Findings:                 Two polyps were found in the transverse colon and                            ascending colon. The polyps were 1 to 8 mm in size                            respectively. These polyps were removed with a Cold                            biopsy forcep and cold snare Respectively.  Resection and retrieval were complete.                           Diverticula were found in the sigmoid colon and                            right colon.                           The entire examined colon appeared Otherwise normal                            on direct and retroflexion views. Biopsies for                            histology were taken with a cold forceps from the                            entire colon for evaluation of microscopic colitis. Complications:            No immediate complications. Estimated blood loss:                            None. Estimated Blood Loss:     Estimated blood loss: none. Impression:               - Two 1 to 8 mm polyps in the transverse colon and                            in the ascending colon, removed with a cold snare                            And biopsy forcep. Resected and retrieved.                            - Diverticulosis in the sigmoid colon and in the                            right colon.                           - The entire examined colon is otherwise normal on                            direct and retroflexion views. Random colon                            biopsies taken. Recommendation:           - Repeat colonoscopy in 5 years for surveillance.                           - Patient has a contact number available for  emergencies. The signs and symptoms of potential                            delayed complications were discussed with the                            patient. Return to normal activities tomorrow.                            Written discharge instructions were provided to the                            patient.                           - Resume previous diet.                           - Continue present medications.                           - Await pathology results.                           - Please schedule an appointment with Dr. Henrene Pastor to                            further evaluate your chronic diarrhea Docia Chuck. Henrene Pastor, MD 10/08/2017 8:46:09 AM This report has been signed electronically.

## 2017-10-08 NOTE — Progress Notes (Signed)
Called to room to assist during endoscopic procedure.  Patient ID and intended procedure confirmed with present staff. Received instructions for my participation in the procedure from the performing physician.  

## 2017-10-09 ENCOUNTER — Telehealth: Payer: Self-pay

## 2017-10-09 NOTE — Telephone Encounter (Signed)
  Follow up Call-  Call back number 10/08/2017  Post procedure Call Back phone  # [phone]  Permission to leave phone message Yes  Some recent data might be hidden     Patient questions:  Do you have a fever, pain , or abdominal swelling? No. Pain Score  0 *  Have you tolerated food without any problems? Yes.    Have you been able to return to your normal activities? Yes.    Do you have any questions about your discharge instructions: Diet   No. Medications  No. Follow up visit  No.  Do you have questions or concerns about your Care? No.  Actions: * If pain score is 4 or above: No action needed, pain <4.

## 2017-10-14 ENCOUNTER — Encounter: Payer: Self-pay | Admitting: Internal Medicine

## 2017-11-07 ENCOUNTER — Encounter: Payer: Self-pay | Admitting: Adult Health

## 2017-12-02 ENCOUNTER — Other Ambulatory Visit: Payer: Self-pay | Admitting: Adult Health

## 2018-01-02 ENCOUNTER — Other Ambulatory Visit: Payer: Self-pay | Admitting: Adult Health

## 2018-01-14 ENCOUNTER — Encounter: Payer: Self-pay | Admitting: Internal Medicine

## 2018-01-14 ENCOUNTER — Ambulatory Visit (INDEPENDENT_AMBULATORY_CARE_PROVIDER_SITE_OTHER): Payer: Medicare Other | Admitting: Internal Medicine

## 2018-01-14 ENCOUNTER — Encounter

## 2018-01-14 VITALS — BP 140/80 | HR 80 | Ht 64.0 in | Wt 183.4 lb

## 2018-01-14 DIAGNOSIS — D122 Benign neoplasm of ascending colon: Secondary | ICD-10-CM | POA: Diagnosis not present

## 2018-01-14 DIAGNOSIS — R197 Diarrhea, unspecified: Secondary | ICD-10-CM | POA: Diagnosis not present

## 2018-01-14 NOTE — Patient Instructions (Signed)
You have been given samples of Xifaxan.  Take one tablet three times a day for 2 weeks.    Please follow up on

## 2018-01-14 NOTE — Progress Notes (Signed)
HISTORY OF PRESENT ILLNESS:  [name] is a 67 y.o. female, part-time [employer] forces operational officer, who presents today for evaluation of chronic diarrhea.  This is her first GI office visit.  She did undergo direct colonoscopy for the purposes of screening October 08, 2017.  At that time she mentioned problems with chronic diarrhea.  Colonoscopy revealed adenomatous colon polyps and diverticulosis.  Follow-up in 5 years recommended.  Random colon biopsies were taken and returned normal with no evidence of microscopic colitis.  She presents today for further evaluation.  Patient tells me that she has had issues with diarrhea "all of my life".  She has bowel movements every day.  Most days are watery loose stools.  Occasionally soft formed stools.  Typically has 3-4 bowel movements per day.  Various times.  Meals do seem to trigger the need for defecation with associated urgency.  No nocturnal symptoms.  No bleeding.  Some weight gain.  Her gallbladder is in.  No new medications.  She has been on metformin twice daily for some time.  She uses Imodium sparingly but this does help.  Her problems with diarrhea are new since and affect her quality of life.  She denies diarrhea.  No significant abdominal pain.  REVIEW OF SYSTEMS:  All non-GI ROS negative less otherwise stated in the HPI except for allergies, anxiety, depression  Past Medical History:  Diagnosis Date  . Allergy   . Anemia   . Anxiety   . ANXIETY DEPRESSION 09/15/2007  . Arthritis   . ASTHMA 05/15/2009  . Asthma   . DIABETES MELLITUS, TYPE II 12/11/2006  . DIVERTICULOSIS, COLON 12/11/2006  . Edema 01/15/2009  . Gout    pt denies; toe was broken  . Headache(784.0) 12/11/2006  . Hyperlipidemia   . HYPERTENSION NEC 08/14/2007  . HYPOTHYROIDISM 12/11/2006  . PELVIC PAIN, CHRONIC 08/14/2007  . Restless leg syndrome     Past Surgical History:  Procedure Laterality Date  . ABDOMINAL HYSTERECTOMY    . APPENDECTOMY    . Bladder Suspension       Social History [name]  reports that she has never smoked. She has never used smokeless tobacco. She reports that she drinks alcohol. She reports that she does not use drugs.  family history includes Breast cancer in her other; COPD in her father and mother; Diabetes in her father, mother, sister, sister, sister, and sister; Diabetes Mellitus II in her sister.  Allergies  Allergen Reactions  . Benadryl [Diphenhydramine Hcl] Anaphylaxis  . Ace Inhibitors Cough  . Amoxicillin   . Aspirin   . Chlorphen-Phenyleph-Methscop     REACTION: rash  . Codeine   . Diphenhydramine Hcl     REACTION: swelling-mouth  . Fish Allergy   . Hydrocodone   . Indomethacin   . Pentazocine Lactate     REACTION: passing out  . Promethazine Hcl   . Quinine     REACTION: itching redface       PHYSICAL EXAMINATION: Vital signs: BP 140/80   Pulse 80   Ht 5\' 4"  (1.626 m)   Wt 183 lb 6.4 oz (83.2 kg)   BMI 31.48 kg/m   Constitutional: generally well-appearing, no acute distress Psychiatric: alert and oriented x3, cooperative Eyes: extraocular movements intact, anicteric, conjunctiva pink Mouth: oral pharynx moist, no lesions Neck: supple no lymphadenopathy Cardiovascular: heart regular rate and rhythm, no murmur Lungs: clear to auscultation bilaterally Abdomen: soft, nontender, nondistended, no obvious ascites, no peritoneal signs, normal bowel sounds,  no organomegaly Rectal: Omitted Extremities: no clubbing, cyanosis, or lower extremity edema bilaterally Skin: no lesions on visible extremities Neuro: No focal deficits.  Cranial nerves intact  ASSESSMENT:  1.  Chronic diarrhea.  Symptoms most compatible with irritable bowel syndrome.  Other considerations include bile salt related diarrhea, medication related, or celiac disease 2.  Adenomatous colon polyps on recent colonoscopy.  Random colon biopsies negative for microscopic colitis 3.  General medical problems including  diabetes   PLAN:  1.  Trial of Xifaxan 550 mg p.o. 3 times daily.  Samples provided 2.  Okay to use Imodium 3.  Office follow-up in 8 weeks 4.  If problems persist check celiac serologies 5.  Consider trial of Colestid if problems persist 6.  Surveillance colonoscopy in 5 years  40 minutes spent face-to-face with the patient.  Greater than 50% of time used for counseling regarding her issues with chronic diarrhea and the treatment plan

## 2018-02-19 ENCOUNTER — Other Ambulatory Visit: Payer: Self-pay | Admitting: Adult Health

## 2018-04-15 ENCOUNTER — Encounter: Payer: Self-pay | Admitting: Adult Health

## 2018-04-22 ENCOUNTER — Encounter: Payer: Self-pay | Admitting: Adult Health

## 2018-04-22 ENCOUNTER — Ambulatory Visit (INDEPENDENT_AMBULATORY_CARE_PROVIDER_SITE_OTHER): Payer: Medicare Other | Admitting: Adult Health

## 2018-04-22 VITALS — BP 144/80 | Temp 97.7°F | Wt 183.0 lb

## 2018-04-22 DIAGNOSIS — Z23 Encounter for immunization: Secondary | ICD-10-CM | POA: Diagnosis not present

## 2018-04-22 DIAGNOSIS — Z760 Encounter for issue of repeat prescription: Secondary | ICD-10-CM | POA: Diagnosis not present

## 2018-04-22 DIAGNOSIS — E118 Type 2 diabetes mellitus with unspecified complications: Secondary | ICD-10-CM | POA: Diagnosis not present

## 2018-04-22 LAB — POCT GLYCOSYLATED HEMOGLOBIN (HGB A1C): HBA1C, POC (CONTROLLED DIABETIC RANGE): 6.6 % (ref 0.0–7.0)

## 2018-04-22 MED ORDER — LEVOTHYROXINE SODIUM 88 MCG PO TABS: 88.0000 ug | ORAL_TABLET | Freq: Every day | ORAL | 0 refills | Status: DC

## 2018-04-22 MED ORDER — DIAZEPAM 2 MG PO TABS: ORAL_TABLET | ORAL | 2 refills | Status: DC

## 2018-04-22 MED ORDER — METFORMIN HCL 500 MG PO TABS: ORAL_TABLET | ORAL | 0 refills | Status: DC

## 2018-04-22 MED ORDER — PRAMIPEXOLE DIHYDROCHLORIDE 1 MG PO TABS: 1.0000 mg | ORAL_TABLET | Freq: Every day | ORAL | 0 refills | Status: DC

## 2018-04-22 MED ORDER — FUROSEMIDE 20 MG PO TABS: 20.0000 mg | ORAL_TABLET | ORAL | 0 refills | Status: DC | PRN

## 2018-04-22 NOTE — Progress Notes (Signed)
Subjective:    Patient ID: [name], female    DOB: [birth year], 68 y.o.   MRN: [id number]  HPI  68 year old female who  has a past medical history of Allergy, Anemia, Anxiety, ANXIETY DEPRESSION (09/15/2007), Arthritis, ASTHMA (05/15/2009), Asthma, DIABETES MELLITUS, TYPE II (12/11/2006), DIVERTICULOSIS, COLON (12/11/2006), Edema (01/15/2009), Gout, Headache(784.0) (12/11/2006), Hyperlipidemia, HYPERTENSION NEC (08/14/2007), HYPOTHYROIDISM (12/11/2006), PELVIC PAIN, CHRONIC (08/14/2007), and Restless leg syndrome.  She presents to the office today for follow up regarding diabetes mellitus. She is currently prescribed Metformin 500 mg BID. She has not had her A1c checked since 05/2017. She has been monitoring her blood sugar at home a few times a week and reports readings below 150 consistently. She has not had soda in 8 months and has been walking more often.   Wt Readings from Last 3 Encounters:  04/22/18 183 lb (83 kg)  01/14/18 183 lb 6.4 oz (83.2 kg)  10/08/17 187 lb (84.8 kg)    Lab Results  Component Value Date   HGBA1C 6.5 05/28/2017   She also needs medications refilled.   Review of Systems See HPI   Past Medical History:  Diagnosis Date  . Allergy   . Anemia   . Anxiety   . ANXIETY DEPRESSION 09/15/2007  . Arthritis   . ASTHMA 05/15/2009  . Asthma   . DIABETES MELLITUS, TYPE II 12/11/2006  . DIVERTICULOSIS, COLON 12/11/2006  . Edema 01/15/2009  . Gout    pt denies; toe was broken  . Headache(784.0) 12/11/2006  . Hyperlipidemia   . HYPERTENSION NEC 08/14/2007  . HYPOTHYROIDISM 12/11/2006  . PELVIC PAIN, CHRONIC 08/14/2007  . Restless leg syndrome     Social History   Socioeconomic History  . Marital status: Married    Spouse name: Not on file  . Number of children: Not on file  . Years of education: Not on file  . Highest education level: Not on file  Occupational History  . Not on file  Social Needs  . Financial resource strain: Not on file  . Food  insecurity:    Worry: Not on file    Inability: Not on file  . Transportation needs:    Medical: Not on file    Non-medical: Not on file  Tobacco Use  . Smoking status: Never Smoker  . Smokeless tobacco: Never Used  Substance and Sexual Activity  . Alcohol use: Yes    Alcohol/week: 0.0 standard drinks    Comment: very rare  . Drug use: No  . Sexual activity: Not on file  Lifestyle  . Physical activity:    Days per week: Not on file    Minutes per session: Not on file  . Stress: Not on file  Relationships  . Social connections:    Talks on phone: Not on file    Gets together: Not on file    Attends religious service: Not on file    Active member of club or organization: Not on file    Attends meetings of clubs or organizations: Not on file    Relationship status: Not on file  . Intimate partner violence:    Fear of current or ex partner: Not on file    Emotionally abused: Not on file    Physically abused: Not on file    Forced sexual activity: Not on file  Other Topics Concern  . Not on file  Social History Narrative  . Not on file    Past Surgical History:  Procedure Laterality Date  . ABDOMINAL HYSTERECTOMY    . APPENDECTOMY    . Bladder Suspension      Family History  Problem Relation Age of Onset  . COPD Mother        smoker  . Diabetes Mother   . COPD Father        smoker  . Diabetes Father   . Diabetes Sister   . Diabetes Mellitus II Sister   . Diabetes Sister   . Diabetes Sister   . Diabetes Sister   . Breast cancer Other   . Colon cancer Neg Hx   . Esophageal cancer Neg Hx   . Pancreatic cancer Neg Hx     Allergies  Allergen Reactions  . Benadryl [Diphenhydramine Hcl] Anaphylaxis  . Ace Inhibitors Cough  . Amoxicillin   . Aspirin   . Chlorphen-Phenyleph-Methscop     REACTION: rash  . Codeine   . Diphenhydramine Hcl     REACTION: swelling-mouth  . Fish Allergy   . Hydrocodone   . Indomethacin   . Pentazocine Lactate     REACTION:  passing out  . Promethazine Hcl   . Quinine     REACTION: itching redface    Current Outpatient Medications on File Prior to Visit  Medication Sig Dispense Refill  . aspirin 81 MG tablet Take 81 mg by mouth at bedtime.    . cholecalciferol (VITAMIN D3) 25 MCG (1000 UT) tablet Take 1,000 Units by mouth daily.    . diazepam (VALIUM) 2 MG tablet 1 by mouth each bedtime when necessary 30 tablet 0  . furosemide (LASIX) 20 MG tablet Take 1 tablet (20 mg total) by mouth daily. (Patient taking differently: Take 20 mg by mouth as needed. ) 90 tablet 1  . glucose blood (ONETOUCH VERIO) test strip Test twice daily. 100 each 5  . levothyroxine (SYNTHROID, LEVOTHROID) 88 MCG tablet Take 1 tablet (88 mcg total) by mouth daily. 90 tablet 2  . losartan-hydrochlorothiazide (HYZAAR) 100-25 MG tablet TAKE 1 TABLET BY MOUTH DAILY 90 tablet 3  . metFORMIN (GLUCOPHAGE) 500 MG tablet TAKE 1 TABLET BY MOUTH TWICE DAILY, WITH A MEAL 120 tablet 0  . pramipexole (MIRAPEX) 1 MG tablet TAKE 1 TABLET BY MOUTH DAILY 90 tablet 2  . triamcinolone ointment (KENALOG) 0.5 % Apply 1 application topically 2 (two) times daily. (Patient taking differently: Apply 1 application topically 2 (two) times daily as needed. ) 30 g 1   No current facility-administered medications on file prior to visit.     BP (!) 144/80   Temp 97.7 F (36.5 C)   Wt 183 lb (83 kg)   BMI 31.41 kg/m       Objective:   Physical Exam Vitals signs and nursing note reviewed.  Constitutional:      Appearance: She is normal weight.  HENT:     Right Ear: Tympanic membrane, ear canal and external ear normal.     Left Ear: Tympanic membrane, ear canal and external ear normal.     Nose: Nose normal. No congestion or rhinorrhea.  Cardiovascular:     Rate and Rhythm: Normal rate and regular rhythm.     Pulses: Normal pulses.     Heart sounds: Normal heart sounds.  Pulmonary:     Effort: Pulmonary effort is normal.     Breath sounds: Normal breath  sounds.  Skin:    General: Skin is warm and dry.  Neurological:     General: No focal  deficit present.     Mental Status: She is alert. Mental status is at baseline.  Psychiatric:        Mood and Affect: Mood normal.        Behavior: Behavior normal.        Thought Content: Thought content normal.        Judgment: Judgment normal.       Assessment & Plan:  1. Controlled type 2 diabetes mellitus with complication, without long-term current use of insulin (HCC)  - POCT A1C- 6.6  - Continue with metformin. Encouraged to continue to work on diet and exercise  - metFORMIN (GLUCOPHAGE) 500 MG tablet; TAKE 1 TABLET BY MOUTH TWICE DAILY, WITH A MEAL  Dispense: 180 tablet; Refill: 0 - Six month follow up   2. Medication refill  - diazepam (VALIUM) 2 MG tablet; 1 by mouth each bedtime when necessary  Dispense: 30 tablet; Refill: 2 - furosemide (LASIX) 20 MG tablet; Take 1 tablet (20 mg total) by mouth as needed.  Dispense: 90 tablet; Refill: 0 - levothyroxine (SYNTHROID, LEVOTHROID) 88 MCG tablet; Take 1 tablet (88 mcg total) by mouth daily.  Dispense: 90 tablet; Refill: 0 - metFORMIN (GLUCOPHAGE) 500 MG tablet; TAKE 1 TABLET BY MOUTH TWICE DAILY, WITH A MEAL  Dispense: 180 tablet; Refill: 0 - pramipexole (MIRAPEX) 1 MG tablet; Take 1 tablet (1 mg total) by mouth daily.  Dispense: 90 tablet; Refill: 0  3. Need for influenza vaccination  - Flu vaccine HIGH DOSE PF (Fluzone High dose)  4. Need for vaccination against Streptococcus pneumoniae  - Pneumococcal conjugate vaccine 13-valent  [name], NP

## 2018-04-24 ENCOUNTER — Telehealth: Payer: Self-pay | Admitting: *Deleted

## 2018-04-24 NOTE — Telephone Encounter (Signed)
[name] for the patient to call [name] regarding scheduling a follow up office appointment with Dr. Scarlette Shorts.

## 2018-04-25 NOTE — Telephone Encounter (Signed)
Left another message for this patient to call me back to schedule a follow up visit with Dr. Scarlette Shorts.

## 2018-04-25 NOTE — Telephone Encounter (Signed)
Pt returned your call stating that [place] at the moment and does not have a  Return date, she will call back to schedule appt when she returns.

## 2018-04-28 NOTE — Telephone Encounter (Signed)
Patient will call us when she returns to [facility] from out of state to make an appointment with Dr. Scarlette Shorts for an office visit.

## 2018-06-03 ENCOUNTER — Encounter: Payer: Self-pay | Admitting: Internal Medicine

## 2018-06-03 ENCOUNTER — Ambulatory Visit (INDEPENDENT_AMBULATORY_CARE_PROVIDER_SITE_OTHER): Payer: Medicare Other | Admitting: Internal Medicine

## 2018-06-03 VITALS — BP 124/70 | HR 80 | Ht 64.0 in | Wt 182.6 lb

## 2018-06-03 DIAGNOSIS — K580 Irritable bowel syndrome with diarrhea: Secondary | ICD-10-CM | POA: Diagnosis not present

## 2018-06-03 NOTE — Progress Notes (Signed)
HISTORY OF PRESENT ILLNESS:  [name] is a 68 y.o. 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  See that dictation for details.  Prior to that visit, the patient underwent screening colonoscopy October 08, 2017.  She mentioned problems with chronic diarrhea.  The examination revealed diverticulosis and diminutive/small colon polyps which were removed and found to be adenomatous.  Random colon biopsies were normal.  No evidence of microscopic colitis.  After her assessment in October she was felt to have irritable bowel syndrome.  She was treated with Xifaxan 550 mg 3 times daily for 2 weeks (samples provided) and asked to follow-up in 2 months.  She follows up at this time.  The patient tells me that during her course of therapy and since she has had dramatic improvement in her bowel habits.  Unless she has dietary indiscretion with items such as leafy vegetables or tomato based foods her bowel habits are more formed and regular with out urgency.  She is quite pleased.  No new GI complaints or interval medical issues.  REVIEW OF SYSTEMS:  All non-GI ROS negative unless otherwise stated in the history of present illness except for allergies  Past Medical History:  Diagnosis Date  . Allergy   . Anemia   . Anxiety   . ANXIETY DEPRESSION 09/15/2007  . Arthritis   . ASTHMA 05/15/2009  . Asthma   . DIABETES MELLITUS, TYPE II 12/11/2006  . DIVERTICULOSIS, COLON 12/11/2006  . Edema 01/15/2009  . Gout    pt denies; toe was broken  . Headache(784.0) 12/11/2006  . Hyperlipidemia   . HYPERTENSION NEC 08/14/2007  . HYPOTHYROIDISM 12/11/2006  . PELVIC PAIN, CHRONIC 08/14/2007  . Restless leg syndrome     Past Surgical History:  Procedure Laterality Date  . ABDOMINAL HYSTERECTOMY    . APPENDECTOMY    . Bladder Suspension      Social History [name]  reports that she has never smoked. She has never used smokeless tobacco.  She reports current alcohol use. She reports that she does not use drugs.  family history includes Breast cancer (age of onset: 78) in an other family member; COPD in her father and mother; Diabetes in her father, mother, sister, sister, sister, and sister; Diabetes Mellitus II in her sister; Lung cancer (age of onset: 6) in her nephew.  Allergies  Allergen Reactions  . Benadryl [Diphenhydramine Hcl] Anaphylaxis  . Ace Inhibitors Cough  . Amoxicillin   . Aspirin   . Chlorphen-Phenyleph-Methscop     REACTION: rash  . Codeine   . Diphenhydramine Hcl     REACTION: swelling-mouth  . Fish Allergy   . Hydrocodone   . Indomethacin   . Pentazocine Lactate     REACTION: passing out  . Promethazine Hcl   . Quinine     REACTION: itching redface       PHYSICAL EXAMINATION: Vital signs: BP 124/70   Pulse 80   Ht 5\' 4"  (1.626 m)   Wt 182 lb 9.6 oz (82.8 kg)   BMI 31.34 kg/m   Constitutional: generally well-appearing, no acute distress Psychiatric: alert and oriented x3, cooperative Eyes: extraocular movements intact, anicteric, conjunctiva pink Mouth: oral pharynx moist, no lesions Neck: supple no lymphadenopathy Cardiovascular: heart regular rate and rhythm, no murmur Lungs: clear to auscultation bilaterally Abdomen: soft, nontender, nondistended, no obvious ascites, no peritoneal signs, normal bowel sounds, no organomegaly Rectal: Omitted Extremities: no rubbing, cyanosis, or lower extremity  edema bilaterally Skin: no lesions on visible extremities Neuro: No focal deficits.  Cranial nerves intact  ASSESSMENT:  1.  Chronic diarrhea.  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 2.  Adenomatous colon polyps on colonoscopy.  Negative random colon biopsies  PLAN:  1.  Discussion today on irritable bowel syndrome and the effects of Xifaxan therapy.  We also touched on small bowel bacterial overgrowth 2.   Patient could use a course of Xifaxan on demand if she has a relapse in symptoms.  She will contact the office if such is the case she is interested in retreatment 3.  Routine surveillance colonoscopy around June 2024. 4.  Return to the care of her primary provider.  GI follow-up as needed

## 2018-06-03 NOTE — Patient Instructions (Signed)
Please follow up as needed 

## 2018-06-24 ENCOUNTER — Other Ambulatory Visit: Payer: Self-pay

## 2018-06-24 ENCOUNTER — Encounter: Payer: Self-pay | Admitting: Adult Health

## 2018-06-24 ENCOUNTER — Other Ambulatory Visit: Payer: Self-pay | Admitting: Adult Health

## 2018-06-24 ENCOUNTER — Ambulatory Visit (INDEPENDENT_AMBULATORY_CARE_PROVIDER_SITE_OTHER): Payer: Medicare Other | Admitting: Adult Health

## 2018-06-24 VITALS — BP 122/88 | HR 71 | Temp 98.3°F | Wt 184.0 lb

## 2018-06-24 DIAGNOSIS — Z780 Asymptomatic menopausal state: Secondary | ICD-10-CM | POA: Diagnosis not present

## 2018-06-24 DIAGNOSIS — B351 Tinea unguium: Secondary | ICD-10-CM

## 2018-06-24 DIAGNOSIS — E2839 Other primary ovarian failure: Secondary | ICD-10-CM

## 2018-06-24 DIAGNOSIS — Z1231 Encounter for screening mammogram for malignant neoplasm of breast: Secondary | ICD-10-CM

## 2018-06-24 LAB — HEPATIC FUNCTION PANEL
ALT: 14 U/L (ref 0–35)
AST: 12 U/L (ref 0–37)
Albumin: 4.1 g/dL (ref 3.5–5.2)
Alkaline Phosphatase: 75 U/L (ref 39–117)
Bilirubin, Direct: 0.1 mg/dL (ref 0.0–0.3)
Total Bilirubin: 0.4 mg/dL (ref 0.2–1.2)
Total Protein: 6.5 g/dL (ref 6.0–8.3)

## 2018-06-24 MED ORDER — TERBINAFINE HCL 250 MG PO TABS: 250.0000 mg | ORAL_TABLET | Freq: Every day | ORAL | 2 refills | Status: AC

## 2018-06-24 NOTE — Progress Notes (Signed)
Subjective:    Patient ID: [name], female    DOB: [birth year], 68 y.o.   MRN: [id number]  HPI 68 year old female who  has a past medical history of Allergy, Anemia, Anxiety, ANXIETY DEPRESSION (09/15/2007), Arthritis, ASTHMA (05/15/2009), Asthma, DIABETES MELLITUS, TYPE II (12/11/2006), DIVERTICULOSIS, COLON (12/11/2006), Edema (01/15/2009), Gout, Headache(784.0) (12/11/2006), Hyperlipidemia, HYPERTENSION NEC (08/14/2007), HYPOTHYROIDISM (12/11/2006), PELVIC PAIN, CHRONIC (08/14/2007), and Restless leg syndrome.  She presents to the office today for an acute issue of toenail fungus on multiple toenails of right foot and on left great toe. She reports that her toenail on left great toe has fallen off.     Review of Systems See HPI   Past Medical History:  Diagnosis Date  . Allergy   . Anemia   . Anxiety   . ANXIETY DEPRESSION 09/15/2007  . Arthritis   . ASTHMA 05/15/2009  . Asthma   . DIABETES MELLITUS, TYPE II 12/11/2006  . DIVERTICULOSIS, COLON 12/11/2006  . Edema 01/15/2009  . Gout    pt denies; toe was broken  . Headache(784.0) 12/11/2006  . Hyperlipidemia   . HYPERTENSION NEC 08/14/2007  . HYPOTHYROIDISM 12/11/2006  . PELVIC PAIN, CHRONIC 08/14/2007  . Restless leg syndrome     Social History   Socioeconomic History  . Marital status: Married    Spouse name: Not on file  . Number of children: Not on file  . Years of education: Not on file  . Highest education level: Not on file  Occupational History  . Not on file  Social Needs  . Financial resource strain: Not on file  . Food insecurity:    Worry: Not on file    Inability: Not on file  . Transportation needs:    Medical: Not on file    Non-medical: Not on file  Tobacco Use  . Smoking status: Never Smoker  . Smokeless tobacco: Never Used  Substance and Sexual Activity  . Alcohol use: Yes    Alcohol/week: 0.0 standard drinks    Comment: very rare  . Drug use: No  . Sexual activity: Not on file  Lifestyle    . Physical activity:    Days per week: Not on file    Minutes per session: Not on file  . Stress: Not on file  Relationships  . Social connections:    Talks on phone: Not on file    Gets together: Not on file    Attends religious service: Not on file    Active member of club or organization: Not on file    Attends meetings of clubs or organizations: Not on file    Relationship status: Not on file  . Intimate partner violence:    Fear of current or ex partner: Not on file    Emotionally abused: Not on file    Physically abused: Not on file    Forced sexual activity: Not on file  Other Topics Concern  . Not on file  Social History Narrative  . Not on file    Past Surgical History:  Procedure Laterality Date  . ABDOMINAL HYSTERECTOMY    . APPENDECTOMY    . Bladder Suspension      Family History  Problem Relation Age of Onset  . COPD Mother        smoker  . Diabetes Mother   . COPD Father        smoker  . Diabetes Father   . Diabetes Sister   . Diabetes Mellitus  II Sister   . Diabetes Sister   . Diabetes Sister   . Diabetes Sister   . Breast cancer Other 58  . Lung cancer Nephew 60  . Colon cancer Neg Hx   . Esophageal cancer Neg Hx   . Pancreatic cancer Neg Hx     Allergies  Allergen Reactions  . Benadryl [Diphenhydramine Hcl] Anaphylaxis  . Ace Inhibitors Cough  . Amoxicillin   . Aspirin   . Chlorphen-Phenyleph-Methscop     REACTION: rash  . Codeine   . Diphenhydramine Hcl     REACTION: swelling-mouth  . Fish Allergy   . Hydrocodone   . Indomethacin   . Pentazocine Lactate     REACTION: passing out  . Promethazine Hcl   . Quinine     REACTION: itching redface    Current Outpatient Medications on File Prior to Visit  Medication Sig Dispense Refill  . acetaminophen (TYLENOL) 500 MG tablet Take 1,000 mg by mouth every 8 (eight) hours as needed.    Marland Kitchen aspirin 81 MG tablet Take 81 mg by mouth at bedtime.    . cholecalciferol (VITAMIN D3) 25 MCG  (1000 UT) tablet Take 1,000 Units by mouth daily.    . Cyanocobalamin (VITAMIN B-12 PO) Take 1 tablet by mouth daily.    . diazepam (VALIUM) 2 MG tablet 1 by mouth each bedtime when necessary 30 tablet 2  . furosemide (LASIX) 20 MG tablet Take 1 tablet (20 mg total) by mouth as needed. 90 tablet 0  . glucose blood (ONETOUCH VERIO) test strip Test twice daily. 100 each 5  . levothyroxine (SYNTHROID, LEVOTHROID) 88 MCG tablet Take 1 tablet (88 mcg total) by mouth daily. 90 tablet 0  . losartan-hydrochlorothiazide (HYZAAR) 100-25 MG tablet TAKE 1 TABLET BY MOUTH DAILY 90 tablet 3  . metFORMIN (GLUCOPHAGE) 500 MG tablet TAKE 1 TABLET BY MOUTH TWICE DAILY, WITH A MEAL 180 tablet 0  . pramipexole (MIRAPEX) 1 MG tablet Take 1 tablet (1 mg total) by mouth daily. 90 tablet 0  . triamcinolone ointment (KENALOG) 0.5 % Apply 1 application topically 2 (two) times daily. (Patient taking differently: Apply 1 application topically 2 (two) times daily as needed. ) 30 g 1   No current facility-administered medications on file prior to visit.     BP 122/88 (BP Location: Left Arm, Patient Position: Sitting, Cuff Size: Large)   Pulse 71   Temp 98.3 F (36.8 C) (Oral)   Wt 184 lb (83.5 kg)   SpO2 98%   BMI 31.58 kg/m       Objective:   Physical Exam Vitals signs and nursing note reviewed.  Constitutional:      Appearance: Normal appearance.  Skin:    General: Skin is warm and dry.     Comments: Toenail fungus on multiple toenails   Neurological:     General: No focal deficit present.     Mental Status: She is alert and oriented to person, place, and time. Mental status is at baseline.       Assessment & Plan:  1. Toenail fungus  - terbinafine (LAMISIL) 250 MG tablet; Take 1 tablet (250 mg total) by mouth daily for 30 days.  Dispense: 30 tablet; Refill: 2 - Hepatic function panel - Follow up in 90 days   2. Post-menopausal  - DG Bone Density; Future  [name], NP

## 2018-07-01 ENCOUNTER — Other Ambulatory Visit: Payer: Self-pay | Admitting: Adult Health

## 2018-07-01 DIAGNOSIS — Z760 Encounter for issue of repeat prescription: Secondary | ICD-10-CM

## 2018-07-01 DIAGNOSIS — E118 Type 2 diabetes mellitus with unspecified complications: Secondary | ICD-10-CM

## 2018-07-02 NOTE — Telephone Encounter (Signed)
Sent to the pharmacy by e-scribe.  Pt scheduled for cpx on 07/22/2018

## 2018-07-18 ENCOUNTER — Other Ambulatory Visit: Payer: Medicare Other

## 2018-07-18 ENCOUNTER — Ambulatory Visit: Payer: Medicare Other

## 2018-07-19 ENCOUNTER — Other Ambulatory Visit: Payer: Self-pay | Admitting: Adult Health

## 2018-07-19 DIAGNOSIS — E118 Type 2 diabetes mellitus with unspecified complications: Secondary | ICD-10-CM

## 2018-07-19 DIAGNOSIS — Z760 Encounter for issue of repeat prescription: Secondary | ICD-10-CM

## 2018-07-21 NOTE — Telephone Encounter (Signed)
FILLED FOR 90 DAYS ON 07/02/2018.  MESSAGE SENT TO THE PHARMACY TO CHECK FILE.

## 2018-07-22 ENCOUNTER — Encounter: Payer: Medicare Other | Admitting: Adult Health

## 2018-07-24 ENCOUNTER — Telehealth: Payer: Self-pay | Admitting: *Deleted

## 2018-07-24 DIAGNOSIS — Z760 Encounter for issue of repeat prescription: Secondary | ICD-10-CM

## 2018-07-24 DIAGNOSIS — E118 Type 2 diabetes mellitus with unspecified complications: Secondary | ICD-10-CM

## 2018-07-24 MED ORDER — METFORMIN HCL 500 MG PO TABS: 500.0000 mg | ORAL_TABLET | Freq: Two times a day (BID) | ORAL | 0 refills | Status: DC

## 2018-07-24 NOTE — Telephone Encounter (Signed)
Confirmed with the pharmacy that they did not received the prescription and sent in another.  Nothing further needed.

## 2018-07-24 NOTE — Telephone Encounter (Signed)
Copied from [place] [id number]. Topic: General - Other >> Jul 24, 2018  9:27 AM Antonieta Iba C wrote: Reason for CRM: pt called in to request a refill on her metformin. Pt says that she was told by her pharmacy that they do not have a Rx on file for this but pt says that she seen PCP and he sent in Rx.    Please assist    Pharmacy: [facility] [place], [place] - [address] AT [address] [phone] (Phone) [phone] (Fax)

## 2018-08-21 ENCOUNTER — Other Ambulatory Visit: Payer: Self-pay | Admitting: Adult Health

## 2018-08-21 DIAGNOSIS — Z760 Encounter for issue of repeat prescription: Secondary | ICD-10-CM

## 2018-08-22 NOTE — Telephone Encounter (Signed)
Sent to the pharmacy by e-scribe for 90 days.  Pt has upcoming cpx in July 2020.

## 2018-09-29 ENCOUNTER — Other Ambulatory Visit: Payer: Self-pay | Admitting: Adult Health

## 2018-09-29 DIAGNOSIS — E118 Type 2 diabetes mellitus with unspecified complications: Secondary | ICD-10-CM

## 2018-09-29 DIAGNOSIS — Z760 Encounter for issue of repeat prescription: Secondary | ICD-10-CM

## 2018-09-30 NOTE — Telephone Encounter (Signed)
Sent to the pharmacy by e-scribe.  Pt is scheduled for cpx on 10/29/2018.

## 2018-10-04 ENCOUNTER — Other Ambulatory Visit: Payer: Self-pay | Admitting: Adult Health

## 2018-10-07 ENCOUNTER — Encounter: Payer: Self-pay | Admitting: Adult Health

## 2018-10-07 NOTE — Telephone Encounter (Signed)
DENIED.  SENT IN ON 09/30/2018 FOR 90 DAYS.  MESSAGE SENT TO THE PHARMACY.

## 2018-10-15 ENCOUNTER — Other Ambulatory Visit: Payer: Self-pay

## 2018-10-15 ENCOUNTER — Ambulatory Visit
Admission: RE | Admit: 2018-10-15 | Discharge: 2018-10-15 | Disposition: A | Discharge status: Home / Self Care | Payer: Medicare Other | Source: Ambulatory Visit | Attending: Adult Health | Admitting: Adult Health

## 2018-10-15 DIAGNOSIS — E2839 Other primary ovarian failure: Secondary | ICD-10-CM

## 2018-10-15 DIAGNOSIS — Z1231 Encounter for screening mammogram for malignant neoplasm of breast: Secondary | ICD-10-CM

## 2018-10-15 DIAGNOSIS — Z13820 Encounter for screening for osteoporosis: Secondary | ICD-10-CM | POA: Diagnosis not present

## 2018-10-15 DIAGNOSIS — Z780 Asymptomatic menopausal state: Secondary | ICD-10-CM | POA: Diagnosis not present

## 2018-10-29 ENCOUNTER — Ambulatory Visit (INDEPENDENT_AMBULATORY_CARE_PROVIDER_SITE_OTHER): Payer: Medicare Other | Admitting: Adult Health

## 2018-10-29 ENCOUNTER — Other Ambulatory Visit: Payer: Self-pay

## 2018-10-29 ENCOUNTER — Encounter: Payer: Self-pay | Admitting: Adult Health

## 2018-10-29 VITALS — BP 162/90 | Temp 97.9°F | Ht 63.25 in | Wt 184.0 lb

## 2018-10-29 DIAGNOSIS — E038 Other specified hypothyroidism: Secondary | ICD-10-CM | POA: Diagnosis not present

## 2018-10-29 DIAGNOSIS — I10 Essential (primary) hypertension: Secondary | ICD-10-CM

## 2018-10-29 DIAGNOSIS — E118 Type 2 diabetes mellitus with unspecified complications: Secondary | ICD-10-CM

## 2018-10-29 DIAGNOSIS — F419 Anxiety disorder, unspecified: Secondary | ICD-10-CM | POA: Diagnosis not present

## 2018-10-29 DIAGNOSIS — F329 Major depressive disorder, single episode, unspecified: Secondary | ICD-10-CM

## 2018-10-29 DIAGNOSIS — F32A Depression, unspecified: Secondary | ICD-10-CM

## 2018-10-29 DIAGNOSIS — G2581 Restless legs syndrome: Secondary | ICD-10-CM

## 2018-10-29 LAB — CBC WITH DIFFERENTIAL/PLATELET
Basophils Absolute: 0 10*3/uL (ref 0.0–0.1)
Basophils Relative: 0.6 % (ref 0.0–3.0)
Eosinophils Absolute: 0.2 10*3/uL (ref 0.0–0.7)
Eosinophils Relative: 2.6 % (ref 0.0–5.0)
HCT: 40.5 % (ref 36.0–46.0)
Hemoglobin: 13 g/dL (ref 12.0–15.0)
Lymphocytes Relative: 24.6 % (ref 12.0–46.0)
Lymphs Abs: 1.6 10*3/uL (ref 0.7–4.0)
MCHC: 32 g/dL (ref 30.0–36.0)
MCV: 91 fl (ref 78.0–100.0)
Monocytes Absolute: 0.4 10*3/uL (ref 0.1–1.0)
Monocytes Relative: 5.4 % (ref 3.0–12.0)
Neutro Abs: 4.4 10*3/uL (ref 1.4–7.7)
Neutrophils Relative %: 66.8 % (ref 43.0–77.0)
Platelets: 248 10*3/uL (ref 150.0–400.0)
RBC: 4.45 Mil/uL (ref 3.87–5.11)
RDW: 14.2 % (ref 11.5–15.5)
WBC: 6.6 10*3/uL (ref 4.0–10.5)

## 2018-10-29 LAB — COMPREHENSIVE METABOLIC PANEL
ALT: 14 U/L (ref 0–35)
AST: 11 U/L (ref 0–37)
Albumin: 4.1 g/dL (ref 3.5–5.2)
Alkaline Phosphatase: 77 U/L (ref 39–117)
BUN: 17 mg/dL (ref 6–23)
CO2: 32 mEq/L (ref 19–32)
Calcium: 9.2 mg/dL (ref 8.4–10.5)
Chloride: 101 mEq/L (ref 96–112)
Creatinine, Ser: 0.84 mg/dL (ref 0.40–1.20)
GFR: 67.76 mL/min (ref >60.00)
Glucose, Bld: 121 mg/dL — ABNORMAL HIGH (ref 70–99)
Potassium: 4.2 mEq/L (ref 3.5–5.1)
Sodium: 140 mEq/L (ref 135–145)
Total Bilirubin: 0.6 mg/dL (ref 0.2–1.2)
Total Protein: 6.4 g/dL (ref 6.0–8.3)

## 2018-10-29 LAB — LIPID PANEL
Cholesterol: 164 mg/dL (ref 0–200)
HDL: 40.4 mg/dL (ref >39.00)
NonHDL: 123.7
Total CHOL/HDL Ratio: 4
Triglycerides: 370 mg/dL — ABNORMAL HIGH (ref 0.0–149.0)
VLDL: 74 mg/dL — ABNORMAL HIGH (ref 0.0–40.0)

## 2018-10-29 LAB — LDL CHOLESTEROL, DIRECT: Direct LDL: 68 mg/dL

## 2018-10-29 LAB — MAGNESIUM: Magnesium: 1.6 mg/dL (ref 1.5–2.5)

## 2018-10-29 LAB — TSH: TSH: 2.03 u[IU]/mL (ref 0.35–4.50)

## 2018-10-29 LAB — HEMOGLOBIN A1C: Hgb A1c MFr Bld: 7 % — ABNORMAL HIGH (ref 4.6–6.5)

## 2018-10-29 MED ORDER — CITALOPRAM HYDROBROMIDE 10 MG PO TABS: 10.0000 mg | ORAL_TABLET | Freq: Every day | ORAL | 1 refills | Status: DC

## 2018-10-29 NOTE — Progress Notes (Signed)
Subjective:    Patient ID: [name], female    DOB: 1951/03/04, 68 y.o.   MRN: [id number]  HPI Patient presents for yearly follow up exam. She is a pleasant 68 year old female who  has a past medical history of Allergy, Anemia, Anxiety, ANXIETY DEPRESSION (09/15/2007), Arthritis, ASTHMA (05/15/2009), Asthma, DIABETES MELLITUS, TYPE II (12/11/2006), DIVERTICULOSIS, COLON (12/11/2006), Edema (01/15/2009), Gout, Headache(784.0) (12/11/2006), Hyperlipidemia, HYPERTENSION NEC (08/14/2007), HYPOTHYROIDISM (12/11/2006), PELVIC PAIN, CHRONIC (08/14/2007), and Restless leg syndrome.   Hypothyroidism - Controlled with synthroid 88 mcg daily  Lab Results  Component Value Date   TSH 2.20 06/13/2017   DM II Controlled- Takes Metformin 500 mg BID.  She denies GI issues while taking metformin or episodes of hypoglycemia. Reports BS have been running in the 140's.  Lab Results  Component Value Date   HGBA1C 6.6 04/22/2018   Essential Hypertension - She takes Hyzaar 100-25 mg and Lasix 20 mg.  She denies chest pain, shortness of breath, dizziness, lightheadedness, or headaches. She did not take her medication this morning  BP Readings from Last 3 Encounters:  10/29/18 (!) 162/90  06/24/18 122/88  06/03/18 124/70    Restless Leg Syndrome - Takes 1 mg of Mirapex QHS   Insomnia - Takes Valium 2 mg PRN. Her last refill was in January 2020. She is having to take Valium more frequently due to anxiety.   Anxiety and Depression - Reports that she has been dealing with a lot of anxiety and depression. She is tearful most days. She reports that her niece is going through breast cancer treatment and her nephew was diagnosed with lung cancer with mets to the brain. He is currently under hospice care. She drove to [place] last week to say goodbye.  Additionally she does not feel she is dealing with the state of the world and is fearful for what the future holds. No SI.   All immunizations and health  maintenance protocols were reviewed with the patient and needed orders were placed. She is up to date on vaccinations   Appropriate screening laboratory values were ordered for the patient including screening of hyperlipidemia, renal function and hepatic function.  Medication reconciliation,  past medical history, social history, problem list and allergies were reviewed in detail with the patient  Goals were established with regard to weight loss, exercise, and  diet in compliance with medications  End of life planning was discussed.  She is up-to-date on routine dental and vision screens.  Her last colonoscopy was in 2019, she is on the 5-year plan. She had a mammogram and bone density screen done this month, both were normal.    Review of Systems  Constitutional: Negative.   HENT: Negative.   Eyes: Negative.   Respiratory: Negative.   Cardiovascular: Negative.   Gastrointestinal: Negative.   Endocrine: Negative.   Genitourinary: Negative.   Musculoskeletal: Positive for arthralgias (right shoulder and left knee).  Skin: Negative.   Allergic/Immunologic: Negative.   Neurological: Negative.   Hematological: Negative.   Psychiatric/Behavioral: Positive for dysphoric mood and sleep disturbance. Negative for suicidal ideas. The patient is nervous/anxious.    Past Medical History:  Diagnosis Date  . Allergy   . Anemia   . Anxiety   . ANXIETY DEPRESSION 09/15/2007  . Arthritis   . ASTHMA 05/15/2009  . Asthma   . DIABETES MELLITUS, TYPE II 12/11/2006  . DIVERTICULOSIS, COLON 12/11/2006  . Edema 01/15/2009  . Gout    pt denies; toe was broken  .  Headache(784.0) 12/11/2006  . Hyperlipidemia   . HYPERTENSION NEC 08/14/2007  . HYPOTHYROIDISM 12/11/2006  . PELVIC PAIN, CHRONIC 08/14/2007  . Restless leg syndrome     Social History   Socioeconomic History  . Marital status: Married    Spouse name: Not on file  . Number of children: Not on file  . Years of education: Not on file  .  Highest education level: Not on file  Occupational History  . Not on file  Social Needs  . Financial resource strain: Not on file  . Food insecurity    Worry: Not on file    Inability: Not on file  . Transportation needs    Medical: Not on file    Non-medical: Not on file  Tobacco Use  . Smoking status: Never Smoker  . Smokeless tobacco: Never Used  Substance and Sexual Activity  . Alcohol use: Yes    Alcohol/week: 0.0 standard drinks    Comment: very rare  . Drug use: No  . Sexual activity: Not on file  Lifestyle  . Physical activity    Days per week: Not on file    Minutes per session: Not on file  . Stress: Not on file  Relationships  . Social Herbalist on phone: Not on file    Gets together: Not on file    Attends religious service: Not on file    Active member of club or organization: Not on file    Attends meetings of clubs or organizations: Not on file    Relationship status: Not on file  . Intimate partner violence    Fear of current or ex partner: Not on file    Emotionally abused: Not on file    Physically abused: Not on file    Forced sexual activity: Not on file  Other Topics Concern  . Not on file  Social History Narrative  . Not on file    Past Surgical History:  Procedure Laterality Date  . ABDOMINAL HYSTERECTOMY    . APPENDECTOMY    . Bladder Suspension      Family History  Problem Relation Age of Onset  . COPD Mother        smoker  . Diabetes Mother   . COPD Father        smoker  . Diabetes Father   . Diabetes Sister   . Diabetes Mellitus II Sister   . Diabetes Sister   . Diabetes Sister   . Diabetes Sister   . Breast cancer Other 58  . Lung cancer Nephew 60  . Colon cancer Neg Hx   . Esophageal cancer Neg Hx   . Pancreatic cancer Neg Hx     Allergies  Allergen Reactions  . Benadryl [Diphenhydramine Hcl] Anaphylaxis  . Ace Inhibitors Cough  . Amoxicillin   . Aspirin   . Chlorphen-Phenyleph-Methscop     REACTION:  rash  . Codeine   . Diphenhydramine Hcl     REACTION: swelling-mouth  . Fish Allergy   . Hydrocodone   . Indomethacin   . Pentazocine Lactate     REACTION: passing out  . Promethazine Hcl   . Quinine     REACTION: itching redface    Current Outpatient Medications on File Prior to Visit  Medication Sig Dispense Refill  . acetaminophen (TYLENOL) 500 MG tablet Take 1,000 mg by mouth every 8 (eight) hours as needed.    Marland Kitchen aspirin 81 MG tablet Take 81 mg by  mouth at bedtime.    . cholecalciferol (VITAMIN D3) 25 MCG (1000 UT) tablet Take 1,000 Units by mouth daily.    . Cyanocobalamin (VITAMIN B-12 PO) Take 1 tablet by mouth daily.    . diazepam (VALIUM) 2 MG tablet 1 by mouth each bedtime when necessary 30 tablet 2  . furosemide (LASIX) 20 MG tablet Take 1 tablet (20 mg total) by mouth as needed. 90 tablet 0  . glucose blood (ONETOUCH VERIO) test strip Test twice daily. 100 each 5  . levothyroxine (SYNTHROID) 88 MCG tablet TAKE 1 TABLET BY MOUTH DAILY 90 tablet 0  . losartan-hydrochlorothiazide (HYZAAR) 100-25 MG tablet TAKE 1 TABLET BY MOUTH DAILY 90 tablet 0  . metFORMIN (GLUCOPHAGE) 500 MG tablet TAKE 1 TABLET BY MOUTH TWICE DAILY WITH A MEAL 180 tablet 0  . pramipexole (MIRAPEX) 1 MG tablet TAKE 1 TABLET BY MOUTH DAILY 90 tablet 0  . triamcinolone ointment (KENALOG) 0.5 % Apply 1 application topically 2 (two) times daily. (Patient taking differently: Apply 1 application topically 2 (two) times daily as needed. ) 30 g 1   No current facility-administered medications on file prior to visit.     BP (!) 162/90 Comment: NO MEDS  Temp 97.9 F (36.6 C)   Ht 5' 3.25" (1.607 m)   Wt 184 lb (83.5 kg)   BMI 32.34 kg/m       Objective:   Physical Exam Vitals signs and nursing note reviewed.  Constitutional:      General: She is not in acute distress.    Appearance: Normal appearance. She is not diaphoretic.  HENT:     Head: Normocephalic and atraumatic.     Right Ear: Tympanic  membrane, ear canal and external ear normal. There is no impacted cerumen.     Left Ear: Tympanic membrane, ear canal and external ear normal. There is no impacted cerumen.     Nose: Nose normal.     Mouth/Throat:     Mouth: Mucous membranes are moist.     Pharynx: Oropharynx is clear. No oropharyngeal exudate or posterior oropharyngeal erythema.  Eyes:     General: No scleral icterus.       Right eye: No discharge.        Left eye: No discharge.     Conjunctiva/sclera: Conjunctivae normal.     Pupils: Pupils are equal, round, and reactive to light.  Neck:     Musculoskeletal: Normal range of motion and neck supple.     Thyroid: No thyromegaly.     Vascular: No JVD.     Trachea: No tracheal deviation.  Cardiovascular:     Rate and Rhythm: Normal rate and regular rhythm.     Heart sounds: Normal heart sounds. No murmur. No friction rub. No gallop.   Pulmonary:     Effort: Pulmonary effort is normal. No respiratory distress.     Breath sounds: Normal breath sounds. No stridor. No wheezing, rhonchi or rales.  Chest:     Chest wall: No tenderness.  Abdominal:     General: Bowel sounds are normal. There is no distension.     Palpations: Abdomen is soft. There is no mass.     Tenderness: There is no abdominal tenderness. There is no right CVA tenderness, left CVA tenderness, guarding or rebound.     Hernia: No hernia is present.  Musculoskeletal: Normal range of motion.        General: No swelling, tenderness, deformity or signs of injury.  Right lower leg: No edema.     Left lower leg: No edema.  Lymphadenopathy:     Cervical: No cervical adenopathy.  Skin:    General: Skin is warm and dry.     Capillary Refill: Capillary refill takes less than 2 seconds.     Coloration: Skin is not jaundiced or pale.     Findings: No bruising, erythema, lesion or rash.  Neurological:     General: No focal deficit present.     Mental Status: She is alert and oriented to person, place, and  time. Mental status is at baseline.     Cranial Nerves: No cranial nerve deficit.     Sensory: No sensory deficit.     Motor: No weakness or abnormal muscle tone.     Coordination: Coordination normal.     Gait: Gait normal.     Deep Tendon Reflexes: Reflexes normal.  Psychiatric:        Mood and Affect: Mood is depressed. Affect is tearful.        Speech: Speech normal.        Behavior: Behavior normal.        Thought Content: Thought content normal.        Cognition and Memory: Cognition and memory normal.        Judgment: Judgment normal.       Assessment & Plan:  .1. Essential hypertension - No change in medication. Has been controlled in the past when she takes her meds.  - CBC with Differential/Platelet - Comprehensive metabolic panel - Hemoglobin A1c - Lipid panel - TSH - Magnesium  2. Restless leg syndrome - Continue Mirapex  3. Other specified hypothyroidism - Consider increase in Synthroid  - CBC with Differential/Platelet - Comprehensive metabolic panel - Hemoglobin A1c - Lipid panel - TSH  4. Controlled type 2 diabetes mellitus with complication, without long-term current use of insulin ([place]) - Consider increase in Metformin  - Work on diet and exercise - CBC with Differential/Platelet - Comprehensive metabolic panel - Hemoglobin A1c - Lipid panel - TSH - Magnesium  5. Anxiety and depression - Will start on low dose Celexa.  - Follow up in 30 days   - citalopram (CELEXA) 10 MG tablet; Take 1 tablet (10 mg total) by mouth daily.  Dispense: 30 tablet; Refill: 1   [name], NP

## 2018-10-31 ENCOUNTER — Other Ambulatory Visit: Payer: Self-pay | Admitting: Family Medicine

## 2018-10-31 MED ORDER — SIMVASTATIN 5 MG PO TABS: 5.0000 mg | ORAL_TABLET | Freq: Every day | ORAL | 3 refills | Status: DC

## 2018-10-31 NOTE — Telephone Encounter (Signed)
Sent to the pharmacy by e-scribe. 

## 2018-11-18 ENCOUNTER — Other Ambulatory Visit: Payer: Self-pay | Admitting: Adult Health

## 2018-11-18 DIAGNOSIS — Z760 Encounter for issue of repeat prescription: Secondary | ICD-10-CM

## 2018-11-19 NOTE — Telephone Encounter (Signed)
Sent to the pharmacy by e-scribe. 

## 2018-12-04 ENCOUNTER — Other Ambulatory Visit: Payer: Self-pay

## 2018-12-04 ENCOUNTER — Encounter: Payer: Self-pay | Admitting: Adult Health

## 2018-12-04 ENCOUNTER — Ambulatory Visit (INDEPENDENT_AMBULATORY_CARE_PROVIDER_SITE_OTHER): Payer: Medicare Other | Admitting: Adult Health

## 2018-12-04 ENCOUNTER — Ambulatory Visit (INDEPENDENT_AMBULATORY_CARE_PROVIDER_SITE_OTHER): Payer: Medicare Other

## 2018-12-04 VITALS — BP 130/64 | HR 92 | Temp 98.0°F | Wt 184.0 lb

## 2018-12-04 DIAGNOSIS — M79671 Pain in right foot: Secondary | ICD-10-CM

## 2018-12-04 DIAGNOSIS — M25511 Pain in right shoulder: Secondary | ICD-10-CM

## 2018-12-04 MED ORDER — METHYLPREDNISOLONE ACETATE 80 MG/ML IJ SUSP
80.0000 mg | Freq: Once | INTRAMUSCULAR | Status: AC
  Administered 2018-12-04: 11:00:00 80 mg via INTRA_ARTICULAR

## 2018-12-04 NOTE — Progress Notes (Signed)
Subjective:    Patient ID: [name], female    DOB: July 09, 1950, 68 y.o.   MRN: [id number]  HPI This 68 year old female who presents to the office with 2 orthopedic complaints.  First complaint is that of right foot pain and swelling that she first noticed approximately 2 weeks ago.  Denies aggravating injury or trauma to the right foot.  She does feel as though pain has become better over the last 2 weeks but she continues to have pain along the outside of her right heel and crossed her foot closest to the toes.  She does feel a small painful "lump" on her heel.  She does report swelling but this has improved over the last 2 weeks.  She has not noticed any calf pain or tenderness, shortness of breath, or chest pain  Second orthopedic complaint is that of right shoulder pain.  This is been present for at least 2 months and is progressively becoming worse.  She reports that she can no longer even "pick up a gallon of milk and put it away".  She has difficulty with bringing her arm up over her head without experiencing significant pain.  Again she denies aggravating injury or trauma.   Review of Systems See HPI   Past Medical History:  Diagnosis Date  . Allergy   . Anemia   . Anxiety   . ANXIETY DEPRESSION 09/15/2007  . Arthritis   . ASTHMA 05/15/2009  . Asthma   . DIABETES MELLITUS, TYPE II 12/11/2006  . DIVERTICULOSIS, COLON 12/11/2006  . Edema 01/15/2009  . Gout    pt denies; toe was broken  . Headache(784.0) 12/11/2006  . Hyperlipidemia   . HYPERTENSION NEC 08/14/2007  . HYPOTHYROIDISM 12/11/2006  . PELVIC PAIN, CHRONIC 08/14/2007  . Restless leg syndrome     Social History   Socioeconomic History  . Marital status: Married    Spouse name: Not on file  . Number of children: Not on file  . Years of education: Not on file  . Highest education level: Not on file  Occupational History  . Not on file  Social Needs  . Financial resource strain: Not on file  . Food  insecurity    Worry: Not on file    Inability: Not on file  . Transportation needs    Medical: Not on file    Non-medical: Not on file  Tobacco Use  . Smoking status: Never Smoker  . Smokeless tobacco: Never Used  Substance and Sexual Activity  . Alcohol use: Yes    Alcohol/week: 0.0 standard drinks    Comment: very rare  . Drug use: No  . Sexual activity: Not on file  Lifestyle  . Physical activity    Days per week: Not on file    Minutes per session: Not on file  . Stress: Not on file  Relationships  . Social Herbalist on phone: Not on file    Gets together: Not on file    Attends religious service: Not on file    Active member of club or organization: Not on file    Attends meetings of clubs or organizations: Not on file    Relationship status: Not on file  . Intimate partner violence    Fear of current or ex partner: Not on file    Emotionally abused: Not on file    Physically abused: Not on file    Forced sexual activity: Not on file  Other  Topics Concern  . Not on file  Social History Narrative  . Not on file    Past Surgical History:  Procedure Laterality Date  . ABDOMINAL HYSTERECTOMY    . APPENDECTOMY    . Bladder Suspension      Family History  Problem Relation Age of Onset  . COPD Mother        smoker  . Diabetes Mother   . COPD Father        smoker  . Diabetes Father   . Diabetes Sister   . Diabetes Mellitus II Sister   . Diabetes Sister   . Diabetes Sister   . Diabetes Sister   . Breast cancer Other 58  . Lung cancer Nephew 60  . Colon cancer Neg Hx   . Esophageal cancer Neg Hx   . Pancreatic cancer Neg Hx     Allergies  Allergen Reactions  . Benadryl [Diphenhydramine Hcl] Anaphylaxis  . Ace Inhibitors Cough  . Amoxicillin   . Aspirin   . Chlorphen-Phenyleph-Methscop     REACTION: rash  . Codeine   . Diphenhydramine Hcl     REACTION: swelling-mouth  . Fish Allergy   . Hydrocodone   . Indomethacin   . Pentazocine  Lactate     REACTION: passing out  . Promethazine Hcl   . Quinine     REACTION: itching redface    Current Outpatient Medications on File Prior to Visit  Medication Sig Dispense Refill  . acetaminophen (TYLENOL) 500 MG tablet Take 1,000 mg by mouth every 8 (eight) hours as needed.    Marland Kitchen aspirin 81 MG tablet Take 81 mg by mouth at bedtime.    . cholecalciferol (VITAMIN D3) 25 MCG (1000 UT) tablet Take 1,000 Units by mouth daily.    . citalopram (CELEXA) 10 MG tablet Take 1 tablet (10 mg total) by mouth daily. 30 tablet 1  . Cyanocobalamin (VITAMIN B-12 PO) Take 1 tablet by mouth daily.    . diazepam (VALIUM) 2 MG tablet 1 by mouth each bedtime when necessary 30 tablet 2  . furosemide (LASIX) 20 MG tablet Take 1 tablet (20 mg total) by mouth as needed. 90 tablet 0  . glucose blood (ONETOUCH VERIO) test strip Test twice daily. 100 each 5  . levothyroxine (SYNTHROID) 88 MCG tablet TAKE 1 TABLET BY MOUTH DAILY 90 tablet 3  . losartan-hydrochlorothiazide (HYZAAR) 100-25 MG tablet TAKE 1 TABLET BY MOUTH DAILY 90 tablet 0  . metFORMIN (GLUCOPHAGE) 500 MG tablet TAKE 1 TABLET BY MOUTH TWICE DAILY WITH A MEAL 180 tablet 0  . pramipexole (MIRAPEX) 1 MG tablet TAKE 1 TABLET BY MOUTH DAILY 90 tablet 3  . simvastatin (ZOCOR) 5 MG tablet Take 1 tablet (5 mg total) by mouth daily. 90 tablet 3  . triamcinolone ointment (KENALOG) 0.5 % Apply 1 application topically 2 (two) times daily. (Patient taking differently: Apply 1 application topically 2 (two) times daily as needed. ) 30 g 1   No current facility-administered medications on file prior to visit.     BP 130/64 (BP Location: Right Arm, Patient Position: Sitting, Cuff Size: Normal)   Pulse 92   Temp 98 F (36.7 C) (Temporal)   Wt 184 lb (83.5 kg)   SpO2 97%   BMI 32.34 kg/m       Objective:   Physical Exam Vitals signs and nursing note reviewed.  Constitutional:      Appearance: Normal appearance.  Musculoskeletal:  General:  Tenderness present. No swelling or deformity.     Right shoulder: She exhibits decreased range of motion, tenderness and bony tenderness. She exhibits no swelling, no effusion, no crepitus, normal pulse and normal strength.     Right lower leg: No edema.     Left lower leg: No edema.     Comments: Right shoulder - Unable to bring arm above head. Has pain in right shoulder with pressure against resistance. Able to perform back scratch test.   Right foot- No redness, warmth, or bruising noted. Has pain along the lateral aspect of right foot and along the dorsal aspect of right foot closest to metatarsals . Questionable lipoma or cyst noted close to head of talus   No calf pain or tenderness  Skin:    General: Skin is warm and dry.     Capillary Refill: Capillary refill takes less than 2 seconds.     Findings: No bruising or erythema.  Neurological:     General: No focal deficit present.     Mental Status: She is alert and oriented to person, place, and time.       Assessment & Plan:  1. Acute pain of right shoulder Discussed risks and benefits of corticosteroid injection and patient consented.  After prepping skin with betadine, injected 80 mg depomedrol and 2 cc of plain xylocaine with 22 gauge one and one half inch needle using anterolateral approach and pt tolerated well. - methylPREDNISolone acetate (DEPO-MEDROL) injection 80 mg - Advised that it may take up to 3 days for steroid injection to work  - Follow up if no improvement - Consider referral to sports medicine or orthopedics for possible rotator cuff injury  2. Right foot pain - Xray to look for stress fracture. Likely strain - Advised conservative measures.  - DG Foot Complete Right; Future   [name], NP

## 2018-12-19 ENCOUNTER — Other Ambulatory Visit: Payer: Self-pay | Admitting: Adult Health

## 2018-12-19 DIAGNOSIS — F419 Anxiety disorder, unspecified: Secondary | ICD-10-CM

## 2018-12-19 DIAGNOSIS — F32A Depression, unspecified: Secondary | ICD-10-CM

## 2018-12-19 DIAGNOSIS — F329 Major depressive disorder, single episode, unspecified: Secondary | ICD-10-CM

## 2018-12-23 ENCOUNTER — Other Ambulatory Visit: Payer: Self-pay

## 2018-12-23 ENCOUNTER — Telehealth (INDEPENDENT_AMBULATORY_CARE_PROVIDER_SITE_OTHER): Payer: Medicare Other | Admitting: Adult Health

## 2018-12-23 DIAGNOSIS — F329 Major depressive disorder, single episode, unspecified: Secondary | ICD-10-CM | POA: Diagnosis not present

## 2018-12-23 DIAGNOSIS — F32A Depression, unspecified: Secondary | ICD-10-CM

## 2018-12-23 DIAGNOSIS — F419 Anxiety disorder, unspecified: Secondary | ICD-10-CM | POA: Diagnosis not present

## 2018-12-23 MED ORDER — CITALOPRAM HYDROBROMIDE 20 MG PO TABS: 20.0000 mg | ORAL_TABLET | Freq: Every day | ORAL | 1 refills | Status: DC

## 2018-12-23 NOTE — Progress Notes (Signed)
Virtual Visit via Video Note  I connected with [name] on 12/23/18 at  2:30 PM EDT by a video enabled telemedicine application and verified that I am speaking with the correct person using two identifiers.  Location patient: home Location provider:work or home office Persons participating in the virtual visit: patient, provider  I discussed the limitations of evaluation and management by telemedicine and the availability of in person appointments. The patient expressed understanding and agreed to proceed.   HPI:  Next he 67-year-old female who is being evaluated today for roughly 1 month follow-up regarding anxiety and depression.  About 6 weeks ago he was started on Celexa 10 mg.  At this time she reported that her niece was going through breast cancer treatment and her nephew was diagnosed with lung cancer with mets to the brain, he was placed on hospice care.  She was crying most days and felt a lot of anxiety from the stressors that she was under.  Is also having a hard time dealing with the state of the world and fearful for what the future will help.  Since being on Celexa she reports that she is no longer crying on a daily basis and her anxiety has improved but has not resolved.  Her daughter who she sees every day tells her that she does not seem like herself and is irritated most days.  She does endorse being irritated but feels as though this is more towards the health of her husband than anything else.  She would like to trial increasing Celexa to 20 mg to see if she gets any more benefit  ROS: See pertinent positives and negatives per HPI.  Past Medical History:  Diagnosis Date  . Allergy   . Anemia   . Anxiety   . ANXIETY DEPRESSION 09/15/2007  . Arthritis   . ASTHMA 05/15/2009  . Asthma   . DIABETES MELLITUS, TYPE II 12/11/2006  . DIVERTICULOSIS, COLON 12/11/2006  . Edema 01/15/2009  . Gout    pt denies; toe was broken  . Headache(784.0) 12/11/2006  .  Hyperlipidemia   . HYPERTENSION NEC 08/14/2007  . HYPOTHYROIDISM 12/11/2006  . PELVIC PAIN, CHRONIC 08/14/2007  . Restless leg syndrome     Past Surgical History:  Procedure Laterality Date  . ABDOMINAL HYSTERECTOMY    . APPENDECTOMY    . Bladder Suspension      Family History  Problem Relation Age of Onset  . COPD Mother        smoker  . Diabetes Mother   . COPD Father        smoker  . Diabetes Father   . Diabetes Sister   . Diabetes Mellitus II Sister   . Diabetes Sister   . Diabetes Sister   . Diabetes Sister   . Breast cancer Other 58  . Lung cancer Nephew 60  . Colon cancer Neg Hx   . Esophageal cancer Neg Hx   . Pancreatic cancer Neg Hx      Current Outpatient Medications:  .  acetaminophen (TYLENOL) 500 MG tablet, Take 1,000 mg by mouth every 8 (eight) hours as needed., Disp: , Rfl:  .  aspirin 81 MG tablet, Take 81 mg by mouth at bedtime., Disp: , Rfl:  .  cholecalciferol (VITAMIN D3) 25 MCG (1000 UT) tablet, Take 1,000 Units by mouth daily., Disp: , Rfl:  .  citalopram (CELEXA) 10 MG tablet, Take 1 tablet (10 mg total) by mouth daily., Disp: 30 tablet, Rfl: 1 .  Cyanocobalamin (VITAMIN B-12 PO), Take 1 tablet by mouth daily., Disp: , Rfl:  .  diazepam (VALIUM) 2 MG tablet, 1 by mouth each bedtime when necessary, Disp: 30 tablet, Rfl: 2 .  furosemide (LASIX) 20 MG tablet, Take 1 tablet (20 mg total) by mouth as needed., Disp: 90 tablet, Rfl: 0 .  glucose blood (ONETOUCH VERIO) test strip, Test twice daily., Disp: 100 each, Rfl: 5 .  levothyroxine (SYNTHROID) 88 MCG tablet, TAKE 1 TABLET BY MOUTH DAILY, Disp: 90 tablet, Rfl: 3 .  losartan-hydrochlorothiazide (HYZAAR) 100-25 MG tablet, TAKE 1 TABLET BY MOUTH DAILY, Disp: 90 tablet, Rfl: 0 .  metFORMIN (GLUCOPHAGE) 500 MG tablet, TAKE 1 TABLET BY MOUTH TWICE DAILY WITH A MEAL, Disp: 180 tablet, Rfl: 0 .  pramipexole (MIRAPEX) 1 MG tablet, TAKE 1 TABLET BY MOUTH DAILY, Disp: 90 tablet, Rfl: 3 .  simvastatin (ZOCOR) 5  MG tablet, Take 1 tablet (5 mg total) by mouth daily., Disp: 90 tablet, Rfl: 3 .  triamcinolone ointment (KENALOG) 0.5 %, Apply 1 application topically 2 (two) times daily. (Patient taking differently: Apply 1 application topically 2 (two) times daily as needed. ), Disp: 30 g, Rfl: 1  EXAM:  VITALS per patient if applicable:  GENERAL: alert, oriented, appears well and in no acute distress  HEENT: atraumatic, conjunttiva clear, no obvious abnormalities on inspection of external nose and ears  NECK: normal movements of the head and neck  LUNGS: on inspection no signs of respiratory distress, breathing rate appears normal, no obvious gross SOB, gasping or wheezing  CV: no obvious cyanosis  MS: moves all visible extremities without noticeable abnormality  PSYCH/NEURO: pleasant and cooperative, no obvious depression or anxiety, speech and thought processing grossly intact  ASSESSMENT AND PLAN:  Discussed the following assessment and plan:  1. Anxiety and depression - Will increase to 20 mg. Advised follow up if symptoms have not improved in 2-3 weeks  - citalopram (CELEXA) 20 MG tablet; Take 1 tablet (20 mg total) by mouth daily.  Dispense: 90 tablet; Refill: 1  I discussed the assessment and treatment plan with the patient. The patient was provided an opportunity to ask questions and all were answered. The patient agreed with the plan and demonstrated an understanding of the instructions.   The patient was advised to call back or seek an in-person evaluation if the symptoms worsen or if the condition fails to improve as anticipated.   [name], NP

## 2018-12-28 ENCOUNTER — Other Ambulatory Visit: Payer: Self-pay | Admitting: Adult Health

## 2018-12-28 DIAGNOSIS — E118 Type 2 diabetes mellitus with unspecified complications: Secondary | ICD-10-CM

## 2018-12-28 DIAGNOSIS — Z760 Encounter for issue of repeat prescription: Secondary | ICD-10-CM

## 2019-02-12 ENCOUNTER — Encounter: Payer: Self-pay | Admitting: Adult Health

## 2019-02-13 ENCOUNTER — Other Ambulatory Visit: Payer: Self-pay

## 2019-02-13 ENCOUNTER — Telehealth (INDEPENDENT_AMBULATORY_CARE_PROVIDER_SITE_OTHER): Payer: Medicare Other | Admitting: Adult Health

## 2019-02-13 DIAGNOSIS — Z20828 Contact with and (suspected) exposure to other viral communicable diseases: Secondary | ICD-10-CM | POA: Diagnosis not present

## 2019-02-13 DIAGNOSIS — Z20822 Contact with and (suspected) exposure to covid-19: Secondary | ICD-10-CM

## 2019-02-13 NOTE — Progress Notes (Signed)
Virtual Visit via Telephone Note  I connected with Sabeen Cassiano on 02/13/19 at  2:00 PM EDT by telephone and verified that I am speaking with the correct person using two identifiers.   I discussed the limitations, risks, security and privacy concerns of performing an evaluation and management service by telephone and the availability of in person appointments. I also discussed with the patient that there may be a patient responsible charge related to this service. The patient expressed understanding and agreed to proceed.  Location patient: home Location provider: work or home office Participants present for the call: patient, provider Patient did not have a visit in the prior 7 days to address this/these issue(s).   History of Present Illness: She is being evaluated today for Covid-like symptoms.  She has been in [place] for the last 2 weeks at her nephew's funeral.  Since returning back to [place] a few days ago she has been running a low-grade fever, experiencing fatigue, has body aches in her lower back, loss of appetite, and family reports that she has been breathing heavier.  She does not endorse shortness of breath, chills, loss of taste or smell.  Her daughter had the same symptoms and tested negative for Covid.   Observations/Objective: Patient sounds cheerful and well on the phone. I do not appreciate any SOB. Speech and thought processing are grossly intact. Patient reported vitals:  Assessment and Plan: 1. Encounter for laboratory testing for COVID-19 virus -Need to rule out Covid due to symptoms.  She will go to [facility] for [employer] testing.  She was advised to rest, stay hydrated, and self quarantine until lab test results.  If symptoms worsen over the weekend then she needs to go to the emergency room - Novel Coronavirus, [facility] ([facility])   Follow Up Instructions:  I did not refer this patient for an OV in the next 24 hours for this/these  issue(s).  I discussed the assessment and treatment plan with the patient. The patient was provided an opportunity to ask questions and all were answered. The patient agreed with the plan and demonstrated an understanding of the instructions.   The patient was advised to call back or seek an in-person evaluation if the symptoms worsen or if the condition fails to improve as anticipated.  I provided 15 minutes of non-face-to-face time during this encounter.   [name], NP

## 2019-02-14 ENCOUNTER — Encounter: Payer: Self-pay | Admitting: Adult Health

## 2019-02-16 ENCOUNTER — Other Ambulatory Visit: Payer: Self-pay

## 2019-02-16 DIAGNOSIS — Z20822 Contact with and (suspected) exposure to covid-19: Secondary | ICD-10-CM

## 2019-02-16 DIAGNOSIS — Z20828 Contact with and (suspected) exposure to other viral communicable diseases: Secondary | ICD-10-CM | POA: Diagnosis not present

## 2019-02-17 LAB — NOVEL CORONAVIRUS, NAA: SARS-CoV-2, NAA: NOT DETECTED

## 2019-03-18 ENCOUNTER — Telehealth (INDEPENDENT_AMBULATORY_CARE_PROVIDER_SITE_OTHER): Payer: Medicare Other | Admitting: Adult Health

## 2019-03-18 ENCOUNTER — Other Ambulatory Visit: Payer: Self-pay

## 2019-03-18 ENCOUNTER — Encounter: Payer: Self-pay | Admitting: Adult Health

## 2019-03-18 DIAGNOSIS — S060X0A Concussion without loss of consciousness, initial encounter: Secondary | ICD-10-CM | POA: Diagnosis not present

## 2019-03-18 NOTE — Progress Notes (Signed)
Virtual Visit via Telephone Note  I connected with [name] on 03/18/19 at 11:00 AM EST by telephone and verified that I am speaking with the correct person using two identifiers.   I discussed the limitations, risks, security and privacy concerns of performing an evaluation and management service by telephone and the availability of in person appointments. I also discussed with the patient that there may be a patient responsible charge related to this service. The patient expressed understanding and agreed to proceed.  Location patient: home Location provider: work or home office Participants present for the call: patient, provider Patient did not have a visit in the prior 7 days to address this/these issue(s).   History of Present Illness: 68 year old female who is being evaluated today for concerns of a head injury.  The patient report 2 nights ago she slipped at home when trying to prevent the neighborhood cat from coming inside.  When she slipped she hit front of her head on the door jam.  She did not lose consciousness.  About 30 minutes after the incident started develop a headache, dizziness, and blurred vision.  Dizziness and blurred vision soon resolved.  Yesterday she to need to experience a headache and had sensation of vertigo.  Today she endorses a headache and intermittent nausea.  She denies vomiting.  She also denies a wound to the head but does report having a "goose egg".  He has not experienced photophobia or phonophobia  She has been taking Tylenol and resting since the incident happened.  She had two vivid dreams last night   Observations/Objective: Patient sounds cheerful and well on the phone. I do not appreciate any SOB. Speech and thought processing are grossly intact. Patient reported vitals:  Assessment and Plan: 1. Concussion without loss of consciousness, initial encounter -Likely mild concussion.  Will hold off on getting CT scan at this  time, as her only neurological complaint is that of a headache, she does remember the incident and what happened prior and post.  Advised to continue to rest and take Tylenol, low stimulation, and advance as tolerated.  Advised that we do have a concussion clinic but she does not want to do that at this time.  Was advised to follow-up if symptoms worsen over the next 12 to 36 hours.   Follow Up Instructions: I did not refer this patient for an OV in the next 24 hours for this/these issue(s).  I discussed the assessment and treatment plan with the patient. The patient was provided an opportunity to ask questions and all were answered. The patient agreed with the plan and demonstrated an understanding of the instructions.   The patient was advised to call back or seek an in-person evaluation if the symptoms worsen or if the condition fails to improve as anticipated.  I provided 18 minutes of non-face-to-face time during this encounter.   [name], NP

## 2019-03-28 ENCOUNTER — Other Ambulatory Visit: Payer: Self-pay | Admitting: Adult Health

## 2019-03-31 NOTE — Telephone Encounter (Signed)
Left a message for a return call.  Pt due for cpx and fasting lab work.

## 2019-03-31 NOTE — Telephone Encounter (Signed)
Sent to the pharmacy by e-scribe. 

## 2019-04-16 ENCOUNTER — Other Ambulatory Visit: Payer: Self-pay | Admitting: Adult Health

## 2019-04-16 DIAGNOSIS — Z760 Encounter for issue of repeat prescription: Secondary | ICD-10-CM

## 2019-04-16 DIAGNOSIS — E118 Type 2 diabetes mellitus with unspecified complications: Secondary | ICD-10-CM

## 2019-05-12 ENCOUNTER — Encounter: Payer: Self-pay | Admitting: Adult Health

## 2019-05-13 ENCOUNTER — Other Ambulatory Visit: Payer: Self-pay

## 2019-05-13 ENCOUNTER — Encounter: Payer: Self-pay | Admitting: Adult Health

## 2019-05-13 ENCOUNTER — Telehealth (INDEPENDENT_AMBULATORY_CARE_PROVIDER_SITE_OTHER): Payer: Medicare Other | Admitting: Adult Health

## 2019-05-13 DIAGNOSIS — L659 Nonscarring hair loss, unspecified: Secondary | ICD-10-CM

## 2019-05-13 DIAGNOSIS — R198 Other specified symptoms and signs involving the digestive system and abdomen: Secondary | ICD-10-CM

## 2019-05-13 DIAGNOSIS — Z7189 Other specified counseling: Secondary | ICD-10-CM | POA: Diagnosis not present

## 2019-05-13 NOTE — Telephone Encounter (Signed)
Called pt to schedule an appt. No further action needed!

## 2019-05-13 NOTE — Progress Notes (Signed)
Virtual Visit via Telephone Note  I connected with [name] on 05/13/19 at  9:00 AM EST by telephone and verified that I am speaking with the correct person using two identifiers.   I discussed the limitations, risks, security and privacy concerns of performing an evaluation and management service by telephone and the availability of in person appointments. I also discussed with the patient that there may be a patient responsible charge related to this service. The patient expressed understanding and agreed to proceed.  Location patient: home Location provider: work or home office Participants present for the call: patient, provider Patient did not have a visit in the prior 7 days to address this/these issue(s).   History of Present Illness: 69 year old female who is being evaluated today for an acute issue of bellybutton odor.  She reports that recently she noticed a "grittiness" sensation in her bellybutton, she then proceeded to stick her finger in her bellybutton and when she smelled her finger it smelled like "poop".  She denies any odor that she can smell on her body.  She does keep herself clean and is very hygienic.  Has not noticed any abdominal pain, discharge from her umbilicus, or feeling acutely ill.   She is also inquiring whether she can get the Covid vaccination and would like to know if she can use biotin to help with thinning hair".   Observations/Objective: Patient sounds cheerful and well on the phone. I do not appreciate any SOB. Speech and thought processing are grossly intact. Patient reported vitals:  Assessment and Plan: 1. 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  She denies any signs of infection.  Advised her that she likely has buildup in one of the crevices of her umbilicus.  Advised gentle scrubbing and/or use of hydrogen peroxide. -Follow-up as needed  2.  Thinning hair -She can use biotin without concern.  There are no guarantees that this is to help with her thinning hair  3. Advice given about COVID-19 virus by telephone -Educated patient on most recent Covid vaccination information.  She is eligible for her vaccination and is able to find any [place] in [place] that she is able to get vaccination at   Follow Up Instructions:  I did not refer this patient for an OV in the next 24 hours for this/these issue(s).  I discussed the assessment and treatment plan with the patient. The patient was provided an opportunity to ask questions and all were answered. The patient agreed with the plan and demonstrated an understanding of the instructions.   The patient was advised to call back or seek an in-person evaluation if the symptoms worsen or if the condition fails to improve as anticipated.  I provided 15 minutes of non-face-to-face time during this encounter.   [name], NP

## 2019-06-19 ENCOUNTER — Other Ambulatory Visit: Payer: Self-pay | Admitting: Adult Health

## 2019-06-19 DIAGNOSIS — F32A Depression, unspecified: Secondary | ICD-10-CM

## 2019-06-19 DIAGNOSIS — F419 Anxiety disorder, unspecified: Secondary | ICD-10-CM

## 2019-06-19 DIAGNOSIS — F329 Major depressive disorder, single episode, unspecified: Secondary | ICD-10-CM

## 2019-06-23 NOTE — Telephone Encounter (Signed)
Sent to the pharmacy by e-scribe. 

## 2019-06-26 ENCOUNTER — Other Ambulatory Visit: Payer: Self-pay | Admitting: Adult Health

## 2019-06-26 DIAGNOSIS — Z760 Encounter for issue of repeat prescription: Secondary | ICD-10-CM

## 2019-06-26 DIAGNOSIS — E118 Type 2 diabetes mellitus with unspecified complications: Secondary | ICD-10-CM

## 2019-06-26 NOTE — Telephone Encounter (Signed)
DENIED.  NEEDS DIABETES FOLLOW UP

## 2019-06-27 ENCOUNTER — Ambulatory Visit: Payer: Medicare Other | Attending: Internal Medicine

## 2019-06-27 DIAGNOSIS — Z23 Encounter for immunization: Secondary | ICD-10-CM

## 2019-06-27 NOTE — Progress Notes (Signed)
   Covid-19 Vaccination Clinic  Name:  Tonjia Vivas    MRN: [id number] DOB: [birth year]  06/27/2019  [name] was observed post Covid-19 immunization for 15 minutes without incident. She was provided with Vaccine Information Sheet and instruction to access the V-Safe system.   [name] was instructed to call 911 with any severe reactions post vaccine: Marland Kitchen Difficulty breathing  . Swelling of face and throat  . A fast heartbeat  . A bad rash all over body  . Dizziness and weakness   Immunizations Administered    Name Date Dose VIS Date Route   Pfizer COVID-19 Vaccine 06/27/2019  8:38 AM 0.3 mL 03/27/2019 Intramuscular   Manufacturer: [place]   Lot: [id number]   [place]: [id number]

## 2019-07-01 ENCOUNTER — Encounter: Payer: Self-pay | Admitting: Adult Health

## 2019-07-02 ENCOUNTER — Other Ambulatory Visit: Payer: Self-pay | Admitting: Adult Health

## 2019-07-02 DIAGNOSIS — E118 Type 2 diabetes mellitus with unspecified complications: Secondary | ICD-10-CM

## 2019-07-02 DIAGNOSIS — Z760 Encounter for issue of repeat prescription: Secondary | ICD-10-CM

## 2019-07-02 MED ORDER — METFORMIN HCL 500 MG PO TABS: 500.0000 mg | ORAL_TABLET | Freq: Two times a day (BID) | ORAL | 0 refills | Status: DC

## 2019-07-15 ENCOUNTER — Other Ambulatory Visit: Payer: Self-pay

## 2019-07-16 ENCOUNTER — Encounter: Payer: Self-pay | Admitting: Adult Health

## 2019-07-16 ENCOUNTER — Ambulatory Visit (INDEPENDENT_AMBULATORY_CARE_PROVIDER_SITE_OTHER): Payer: Medicare Other | Admitting: Adult Health

## 2019-07-16 VITALS — BP 144/76 | Temp 98.0°F | Wt 183.0 lb

## 2019-07-16 DIAGNOSIS — E118 Type 2 diabetes mellitus with unspecified complications: Secondary | ICD-10-CM | POA: Diagnosis not present

## 2019-07-16 LAB — POCT GLYCOSYLATED HEMOGLOBIN (HGB A1C): HbA1c, POC (controlled diabetic range): 6.7 % (ref 0.0–7.0)

## 2019-07-16 NOTE — Progress Notes (Signed)
Subjective:    Patient ID: [name], female    DOB: 10-Dec-1950, 70 y.o.   MRN: [id number]  HPI  69 year old female who  has a past medical history of Allergy, Anemia, Anxiety, ANXIETY DEPRESSION (09/15/2007), Arthritis, ASTHMA (05/15/2009), Asthma, DIABETES MELLITUS, TYPE II (12/11/2006), DIVERTICULOSIS, COLON (12/11/2006), Edema (01/15/2009), Gout, Headache(784.0) (12/11/2006), Hyperlipidemia, HYPERTENSION NEC (08/14/2007), HYPOTHYROIDISM (12/11/2006), PELVIC PAIN, CHRONIC (08/14/2007), and Restless leg syndrome. She presents to the office today for follow up regarding DM. She is currently well controlled on Metformin 500 mg daily.  Her last A1c in July 2015 was 7.0.  She is trying to stay active and eat a heart healthy diet for the most part.  When she does monitor her blood sugars at home they are usually within normal limits unless she has something sweet or filled with carbs.  She denies any episodes of hypoglycemia.  Wt Readings from Last 8 Encounters:  07/16/19 183 lb (83 kg)  12/04/18 184 lb (83.5 kg)  10/29/18 184 lb (83.5 kg)  06/24/18 184 lb (83.5 kg)  06/03/18 182 lb 9.6 oz (82.8 kg)  04/22/18 183 lb (83 kg)  01/14/18 183 lb 6.4 oz (83.2 kg)  10/08/17 187 lb (84.8 kg)    Review of Systems See HPI   Past Medical History:  Diagnosis Date  . Allergy   . Anemia   . Anxiety   . ANXIETY DEPRESSION 09/15/2007  . Arthritis   . ASTHMA 05/15/2009  . Asthma   . DIABETES MELLITUS, TYPE II 12/11/2006  . DIVERTICULOSIS, COLON 12/11/2006  . Edema 01/15/2009  . Gout    pt denies; toe was broken  . Headache(784.0) 12/11/2006  . Hyperlipidemia   . HYPERTENSION NEC 08/14/2007  . HYPOTHYROIDISM 12/11/2006  . PELVIC PAIN, CHRONIC 08/14/2007  . Restless leg syndrome     Social History   Socioeconomic History  . Marital status: Married    Spouse name: Not on file  . Number of children: Not on file  . Years of education: Not on file  . Highest education level: Not on file   Occupational History  . Not on file  Tobacco Use  . Smoking status: Never Smoker  . Smokeless tobacco: Never Used  Substance and Sexual Activity  . Alcohol use: Yes    Alcohol/week: 0.0 standard drinks    Comment: very rare  . Drug use: No  . Sexual activity: Not on file  Other Topics Concern  . Not on file  Social History Narrative  . Not on file   Social Determinants of Health   Financial Resource Strain:   . Difficulty of Paying Living Expenses:   Food Insecurity:   . Worried About Charity fundraiser in the Last Year:   . Arboriculturist in the Last Year:   Transportation Needs:   . Film/video editor (Medical):   Marland Kitchen Lack of Transportation (Non-Medical):   Physical Activity:   . Days of Exercise per Week:   . Minutes of Exercise per Session:   Stress:   . Feeling of Stress :   Social Connections:   . Frequency of Communication with Friends and Family:   . Frequency of Social Gatherings with Friends and Family:   . Attends Religious Services:   . Active Member of Clubs or Organizations:   . Attends Archivist Meetings:   Marland Kitchen Marital Status:   Intimate Partner Violence:   . Fear of Current or Ex-Partner:   .  Emotionally Abused:   Marland Kitchen Physically Abused:   . Sexually Abused:     Past Surgical History:  Procedure Laterality Date  . ABDOMINAL HYSTERECTOMY    . APPENDECTOMY    . Bladder Suspension      Family History  Problem Relation Age of Onset  . COPD Mother        smoker  . Diabetes Mother   . COPD Father        smoker  . Diabetes Father   . Diabetes Sister   . Diabetes Mellitus II Sister   . Diabetes Sister   . Diabetes Sister   . Diabetes Sister   . Breast cancer Other 58  . Lung cancer Nephew 60  . Colon cancer Neg Hx   . Esophageal cancer Neg Hx   . Pancreatic cancer Neg Hx     Allergies  Allergen Reactions  . Benadryl [Diphenhydramine Hcl] Anaphylaxis  . Ace Inhibitors Cough  . Amoxicillin   . Aspirin   .  Chlorphen-Phenyleph-Methscop     REACTION: rash  . Codeine   . Diphenhydramine Hcl     REACTION: swelling-mouth  . Fish Allergy   . Hydrocodone   . Indomethacin   . Pentazocine Lactate     REACTION: passing out  . Promethazine Hcl   . Quinine     REACTION: itching redface    Current Outpatient Medications on File Prior to Visit  Medication Sig Dispense Refill  . acetaminophen (TYLENOL) 500 MG tablet Take 1,000 mg by mouth every 8 (eight) hours as needed.    Marland Kitchen aspirin 81 MG tablet Take 81 mg by mouth at bedtime.    . cholecalciferol (VITAMIN D3) 25 MCG (1000 UT) tablet Take 1,000 Units by mouth daily.    . citalopram (CELEXA) 20 MG tablet TAKE 1 TABLET BY MOUTH DAILY 90 tablet 0  . Cyanocobalamin (VITAMIN B-12 PO) Take 1 tablet by mouth daily.    . diazepam (VALIUM) 2 MG tablet 1 by mouth each bedtime when necessary 30 tablet 2  . furosemide (LASIX) 20 MG tablet Take 1 tablet (20 mg total) by mouth as needed. 90 tablet 0  . glucose blood (ONETOUCH VERIO) test strip Test twice daily. 100 each 5  . levothyroxine (SYNTHROID) 88 MCG tablet TAKE 1 TABLET BY MOUTH DAILY 90 tablet 3  . losartan-hydrochlorothiazide (HYZAAR) 100-25 MG tablet TAKE 1 TABLET BY MOUTH DAILY 90 tablet 2  . metFORMIN (GLUCOPHAGE) 500 MG tablet Take 1 tablet (500 mg total) by mouth 2 (two) times daily with a meal. 180 tablet 0  . pramipexole (MIRAPEX) 1 MG tablet TAKE 1 TABLET BY MOUTH DAILY 90 tablet 3  . simvastatin (ZOCOR) 5 MG tablet Take 1 tablet (5 mg total) by mouth daily. 90 tablet 3  . triamcinolone ointment (KENALOG) 0.5 % Apply 1 application topically 2 (two) times daily. (Patient taking differently: Apply 1 application topically 2 (two) times daily as needed. ) 30 g 1   No current facility-administered medications on file prior to visit.    BP (!) 144/76   Temp 98 F (36.7 C)   Wt 183 lb (83 kg)   BMI 32.16 kg/m       Objective:   Physical Exam Vitals and nursing note reviewed.   Constitutional:      Appearance: Normal appearance.  Musculoskeletal:        General: Normal range of motion.  Skin:    General: Skin is warm and dry.  Capillary Refill: Capillary refill takes less than 2 seconds.  Neurological:     General: No focal deficit present.     Mental Status: She is alert and oriented to person, place, and time.  Psychiatric:        Mood and Affect: Mood normal.        Behavior: Behavior normal.        Thought Content: Thought content normal.        Judgment: Judgment normal.       Assessment & Plan:  1. Controlled type 2 diabetes mellitus with complication, without long-term current use of insulin (HCC)  - POCT A1C- 6.7  -Continue to work on exercise and heart healthy diet to lose weight.  We will follow-up in July for her CPE at which time we will retest.  [name], NP

## 2019-07-16 NOTE — Patient Instructions (Signed)
Your A1c was 6.7   Please schedule your physical for anytime after July 15th

## 2019-07-21 ENCOUNTER — Ambulatory Visit: Payer: Medicare Other | Attending: Internal Medicine

## 2019-07-21 DIAGNOSIS — Z23 Encounter for immunization: Secondary | ICD-10-CM

## 2019-07-21 NOTE — Progress Notes (Signed)
   Covid-19 Vaccination Clinic  Name:  [name]    MRN: [id number] DOB: 1950/10/14  07/21/2019  [name] was observed post Covid-19 immunization for 30 minutes based on pre-vaccination screening without incident. She was provided with Vaccine Information Sheet and instruction to access the V-Safe system.   [name] was instructed to call 911 with any severe reactions post vaccine: Marland Kitchen Difficulty breathing  . Swelling of face and throat  . A fast heartbeat  . A bad rash all over body  . Dizziness and weakness   Immunizations Administered    Name Date Dose VIS Date Route   Pfizer COVID-19 Vaccine 07/21/2019  9:28 AM 0.3 mL 03/27/2019 Intramuscular   Manufacturer: [place]   Lot: [id number]   [place]: [id number]

## 2019-09-16 ENCOUNTER — Encounter: Payer: Self-pay | Admitting: Adult Health

## 2019-09-16 ENCOUNTER — Telehealth (INDEPENDENT_AMBULATORY_CARE_PROVIDER_SITE_OTHER): Payer: Medicare Other | Admitting: Adult Health

## 2019-09-16 ENCOUNTER — Ambulatory Visit: Payer: Medicare Other | Admitting: Family Medicine

## 2019-09-16 ENCOUNTER — Other Ambulatory Visit: Payer: Self-pay

## 2019-09-16 VITALS — Ht 63.5 in | Wt 180.0 lb

## 2019-09-16 DIAGNOSIS — N390 Urinary tract infection, site not specified: Secondary | ICD-10-CM | POA: Diagnosis not present

## 2019-09-16 DIAGNOSIS — Z20822 Contact with and (suspected) exposure to covid-19: Secondary | ICD-10-CM | POA: Diagnosis not present

## 2019-09-16 DIAGNOSIS — R197 Diarrhea, unspecified: Secondary | ICD-10-CM | POA: Diagnosis not present

## 2019-09-16 DIAGNOSIS — R509 Fever, unspecified: Secondary | ICD-10-CM | POA: Diagnosis not present

## 2019-09-16 DIAGNOSIS — M79641 Pain in right hand: Secondary | ICD-10-CM | POA: Diagnosis not present

## 2019-09-17 ENCOUNTER — Encounter: Payer: Self-pay | Admitting: Adult Health

## 2019-09-17 NOTE — Progress Notes (Signed)
Virtual Visit via Telephone Note  I connected with [name] on 09/16/2019 at  4:00 PM EDT by telephone and verified that I am speaking with the correct person using two identifiers.   I discussed the limitations, risks, security and privacy concerns of performing an evaluation and management service by telephone and the availability of in person appointments. I also discussed with the patient that there may be a patient responsible charge related to this service. The patient expressed understanding and agreed to proceed.  Location patient: home Location provider: work or home office Participants present for the call: patient, provider Patient did not have a visit in the prior 7 days to address this/these issue(s).   History of Present Illness: 69 year old female who is being evaluated today for swelling of digits 1 through 3 on her right hand.  She reports that her symptoms first noticed 2 days ago.  She believes that she may have "jammed my fingers into her shoe box" 3 days prior to when she first noticed the symptoms.  She reports that they are swollen, painful, and has endorsed weakness.  Unfortunately, yesterday she developed a fever up to 102 degrees, chills, body aches, and diarrhea.  She went to urgent care earlier today and was treated with antibiotics for concern of a UTI.  Her rapid Covid test was negative and she had been vaccinated against COVID-19.  She is waiting for her second Covid test to come back.  Due to this she was not allowed in the office today   Observations/Objective: Patient sounds cheerful and well on the phone. I do not appreciate any SOB. Speech and thought processing are grossly intact. Patient reported vitals:  Assessment and Plan: 1. Right hand pain Unknown cause at this time being that this was a virtual visit via telephone.  Although unlikely that she has COVID-19 she was advised to let me know when she got her second Covid test back and if  it was negative I would bring her into the office to further evaluate her complaints.  She is agreeable to do so.  In the meantime can do conservative measures.   Follow Up Instructions:  I did not refer this patient for an OV in the next 24 hours for this/these issue(s).  I discussed the assessment and treatment plan with the patient. The patient was provided an opportunity to ask questions and all were answered. The patient agreed with the plan and demonstrated an understanding of the instructions.   The patient was advised to call back or seek an in-person evaluation if the symptoms worsen or if the condition fails to improve as anticipated.  I provided 16 minutes of non-face-to-face time during this encounter.   Carol Peng, NP

## 2019-09-24 ENCOUNTER — Other Ambulatory Visit: Payer: Self-pay | Admitting: Adult Health

## 2019-09-24 DIAGNOSIS — Z760 Encounter for issue of repeat prescription: Secondary | ICD-10-CM

## 2019-09-24 DIAGNOSIS — E118 Type 2 diabetes mellitus with unspecified complications: Secondary | ICD-10-CM

## 2019-09-25 NOTE — Telephone Encounter (Signed)
SENT TO THE PHARMACY BY E-SCRIBE.  PT HAS UPCOMING CPX.

## 2019-09-28 ENCOUNTER — Encounter: Payer: Self-pay | Admitting: Adult Health

## 2019-09-30 ENCOUNTER — Ambulatory Visit (INDEPENDENT_AMBULATORY_CARE_PROVIDER_SITE_OTHER): Payer: Medicare Other | Admitting: Adult Health

## 2019-09-30 ENCOUNTER — Other Ambulatory Visit: Payer: Self-pay

## 2019-09-30 ENCOUNTER — Encounter: Payer: Self-pay | Admitting: Adult Health

## 2019-09-30 VITALS — BP 138/78 | Temp 98.1°F | Wt 180.0 lb

## 2019-09-30 DIAGNOSIS — M25562 Pain in left knee: Secondary | ICD-10-CM

## 2019-09-30 MED ORDER — METHYLPREDNISOLONE ACETATE 80 MG/ML IJ SUSP
80.0000 mg | Freq: Once | INTRAMUSCULAR | Status: AC
  Administered 2019-09-30: 80 mg via INTRA_ARTICULAR

## 2019-09-30 NOTE — Progress Notes (Signed)
Subjective:    Patient ID: [name], female    DOB: Jan 11, 1951, 69 y.o.   MRN: [id number]  HPI 69 year old female who  has a past medical history of Allergy, Anemia, Anxiety, ANXIETY DEPRESSION (09/15/2007), Arthritis, ASTHMA (05/15/2009), Asthma, DIABETES MELLITUS, TYPE II (12/11/2006), DIVERTICULOSIS, COLON (12/11/2006), Edema (01/15/2009), Gout, Headache(784.0) (12/11/2006), Hyperlipidemia, HYPERTENSION NEC (08/14/2007), HYPOTHYROIDISM (12/11/2006), PELVIC PAIN, CHRONIC (08/14/2007), and Restless leg syndrome.  She presents to the office today for left knee pain that has been present for some time now.  Pain is worse in the morning, with change of positions, and walking up stairs.  She has not noticed any redness or warmth.  No trauma or aggravating injury.  She is going out of town on vacation on Friday and is hoping that she can get a steroid injection.  In the past we have done steroid injection to her shoulder to which she responded very well with.   Review of Systems See HPI   Past Medical History:  Diagnosis Date  . Allergy   . Anemia   . Anxiety   . ANXIETY DEPRESSION 09/15/2007  . Arthritis   . ASTHMA 05/15/2009  . Asthma   . DIABETES MELLITUS, TYPE II 12/11/2006  . DIVERTICULOSIS, COLON 12/11/2006  . Edema 01/15/2009  . Gout    pt denies; toe was broken  . Headache(784.0) 12/11/2006  . Hyperlipidemia   . HYPERTENSION NEC 08/14/2007  . HYPOTHYROIDISM 12/11/2006  . PELVIC PAIN, CHRONIC 08/14/2007  . Restless leg syndrome     Social History   Socioeconomic History  . Marital status: Married    Spouse name: Not on file  . Number of children: Not on file  . Years of education: Not on file  . Highest education level: Not on file  Occupational History  . Not on file  Tobacco Use  . Smoking status: Never Smoker  . Smokeless tobacco: Never Used  Substance and Sexual Activity  . Alcohol use: Yes    Alcohol/week: 0.0 standard drinks    Comment: very rare  . Drug use:  No  . Sexual activity: Not on file  Other Topics Concern  . Not on file  Social History Narrative  . Not on file   Social Determinants of Health   Financial Resource Strain:   . Difficulty of Paying Living Expenses:   Food Insecurity:   . Worried About Charity fundraiser in the Last Year:   . Arboriculturist in the Last Year:   Transportation Needs:   . Film/video editor (Medical):   Marland Kitchen Lack of Transportation (Non-Medical):   Physical Activity:   . Days of Exercise per Week:   . Minutes of Exercise per Session:   Stress:   . Feeling of Stress :   Social Connections:   . Frequency of Communication with Friends and Family:   . Frequency of Social Gatherings with Friends and Family:   . Attends Religious Services:   . Active Member of Clubs or Organizations:   . Attends Archivist Meetings:   Marland Kitchen Marital Status:   Intimate Partner Violence:   . Fear of Current or Ex-Partner:   . Emotionally Abused:   Marland Kitchen Physically Abused:   . Sexually Abused:     Past Surgical History:  Procedure Laterality Date  . ABDOMINAL HYSTERECTOMY    . APPENDECTOMY    . Bladder Suspension      Family History  Problem Relation Age of Onset  .  COPD Mother        smoker  . Diabetes Mother   . COPD Father        smoker  . Diabetes Father   . Diabetes Sister   . Diabetes Mellitus II Sister   . Diabetes Sister   . Diabetes Sister   . Diabetes Sister   . Breast cancer Other 58  . Lung cancer Nephew 60  . Colon cancer Neg Hx   . Esophageal cancer Neg Hx   . Pancreatic cancer Neg Hx     Allergies  Allergen Reactions  . Benadryl [Diphenhydramine Hcl] Anaphylaxis  . Ace Inhibitors Cough  . Amoxicillin   . Aspirin   . Chlorphen-Phenyleph-Methscop     REACTION: rash  . Codeine   . Diphenhydramine Hcl     REACTION: swelling-mouth  . Fish Allergy   . Hydrocodone   . Indomethacin   . Pentazocine Lactate     REACTION: passing out  . Promethazine Hcl   . Quinine      REACTION: itching redface    Current Outpatient Medications on File Prior to Visit  Medication Sig Dispense Refill  . acetaminophen (TYLENOL) 500 MG tablet Take 1,000 mg by mouth every 8 (eight) hours as needed.    Marland Kitchen aspirin 81 MG tablet Take 81 mg by mouth at bedtime.    . cholecalciferol (VITAMIN D3) 25 MCG (1000 UT) tablet Take 1,000 Units by mouth daily.    . citalopram (CELEXA) 20 MG tablet TAKE 1 TABLET BY MOUTH DAILY 90 tablet 0  . Cyanocobalamin (VITAMIN B-12 PO) Take 1 tablet by mouth daily.    . diazepam (VALIUM) 2 MG tablet 1 by mouth each bedtime when necessary 30 tablet 2  . furosemide (LASIX) 20 MG tablet Take 1 tablet (20 mg total) by mouth as needed. 90 tablet 0  . glucose blood (ONETOUCH VERIO) test strip Test twice daily. 100 each 5  . levothyroxine (SYNTHROID) 88 MCG tablet TAKE 1 TABLET BY MOUTH DAILY 90 tablet 3  . losartan-hydrochlorothiazide (HYZAAR) 100-25 MG tablet TAKE 1 TABLET BY MOUTH DAILY 90 tablet 2  . metFORMIN (GLUCOPHAGE) 500 MG tablet TAKE 1 TABLET(500 MG) BY MOUTH TWICE DAILY WITH A MEAL 180 tablet 0  . pramipexole (MIRAPEX) 1 MG tablet TAKE 1 TABLET BY MOUTH DAILY 90 tablet 3  . simvastatin (ZOCOR) 5 MG tablet Take 1 tablet (5 mg total) by mouth daily. 90 tablet 3  . triamcinolone ointment (KENALOG) 0.5 % Apply 1 application topically 2 (two) times daily. (Patient taking differently: Apply 1 application topically 2 (two) times daily as needed. ) 30 g 1   No current facility-administered medications on file prior to visit.    BP 138/78   Temp 98.1 F (36.7 C)   Wt 180 lb (81.6 kg)   BMI 31.39 kg/m       Objective:   Physical Exam Vitals and nursing note reviewed.  Constitutional:      Appearance: Normal appearance.  Musculoskeletal:        General: Tenderness present.     Left knee: Bony tenderness present. No swelling, deformity, effusion, erythema or crepitus. Tenderness present. Normal alignment, normal meniscus and normal patellar  mobility.       Legs:  Skin:    General: Skin is warm and dry.  Neurological:     General: No focal deficit present.     Mental Status: She is alert and oriented to person, place, and time.  Psychiatric:  Mood and Affect: Mood normal.        Behavior: Behavior normal.        Thought Content: Thought content normal.        Judgment: Judgment normal.       Assessment & Plan:  1. Acute pain of left knee - Likely osteoarthritis. I do think she would benefit from a knee injection at this time.  Discussed risks and benefits of corticosteroid injection and patient consented.  After prepping skin with betadine, injected 80 mg depomedrol and 2 cc of plain xylocaine with 22 gauge one and one half inch needle using anterolateral approach and pt tolerated well. - methylPREDNISolone acetate (DEPO-MEDROL) injection 80 mg -Advised that it can take up to 3 days for the steroid injection to work completely.  Follow-up if no improvement

## 2019-10-01 ENCOUNTER — Other Ambulatory Visit: Payer: Self-pay | Admitting: Family Medicine

## 2019-10-01 DIAGNOSIS — F419 Anxiety disorder, unspecified: Secondary | ICD-10-CM

## 2019-10-01 DIAGNOSIS — F32A Depression, unspecified: Secondary | ICD-10-CM

## 2019-10-01 MED ORDER — CITALOPRAM HYDROBROMIDE 20 MG PO TABS: 20.0000 mg | ORAL_TABLET | Freq: Every day | ORAL | 0 refills | Status: DC

## 2019-10-01 NOTE — Telephone Encounter (Signed)
Sent to the pharmacy by e-scribe for 90 days.  Pt has upcoming cpx. 

## 2019-10-21 ENCOUNTER — Other Ambulatory Visit: Payer: Self-pay | Admitting: Adult Health

## 2019-10-21 NOTE — Telephone Encounter (Signed)
SENT TO THE PHARMACY BY E-SCRIBE FOR 90 DAYS.  PT HAS UPCOMING CPX ON 11/03/19.

## 2019-11-03 ENCOUNTER — Other Ambulatory Visit: Payer: Self-pay

## 2019-11-03 ENCOUNTER — Encounter: Payer: Self-pay | Admitting: Adult Health

## 2019-11-03 ENCOUNTER — Ambulatory Visit (INDEPENDENT_AMBULATORY_CARE_PROVIDER_SITE_OTHER): Payer: Medicare Other | Admitting: Adult Health

## 2019-11-03 VITALS — BP 136/84 | Temp 98.8°F | Ht 63.5 in | Wt 182.0 lb

## 2019-11-03 DIAGNOSIS — Z23 Encounter for immunization: Secondary | ICD-10-CM | POA: Diagnosis not present

## 2019-11-03 DIAGNOSIS — F419 Anxiety disorder, unspecified: Secondary | ICD-10-CM

## 2019-11-03 DIAGNOSIS — E782 Mixed hyperlipidemia: Secondary | ICD-10-CM

## 2019-11-03 DIAGNOSIS — I10 Essential (primary) hypertension: Secondary | ICD-10-CM | POA: Diagnosis not present

## 2019-11-03 DIAGNOSIS — E118 Type 2 diabetes mellitus with unspecified complications: Secondary | ICD-10-CM

## 2019-11-03 DIAGNOSIS — G2581 Restless legs syndrome: Secondary | ICD-10-CM | POA: Diagnosis not present

## 2019-11-03 DIAGNOSIS — F329 Major depressive disorder, single episode, unspecified: Secondary | ICD-10-CM

## 2019-11-03 DIAGNOSIS — E038 Other specified hypothyroidism: Secondary | ICD-10-CM

## 2019-11-03 DIAGNOSIS — R1310 Dysphagia, unspecified: Secondary | ICD-10-CM | POA: Diagnosis not present

## 2019-11-03 DIAGNOSIS — R2681 Unsteadiness on feet: Secondary | ICD-10-CM

## 2019-11-03 DIAGNOSIS — R7309 Other abnormal glucose: Secondary | ICD-10-CM | POA: Diagnosis not present

## 2019-11-03 MED ORDER — PRAMIPEXOLE DIHYDROCHLORIDE 1 MG PO TABS: 1.0000 mg | ORAL_TABLET | Freq: Every day | ORAL | 1 refills | Status: DC

## 2019-11-03 MED ORDER — LOSARTAN POTASSIUM-HCTZ 100-25 MG PO TABS: 1.0000 | ORAL_TABLET | Freq: Every day | ORAL | 3 refills | Status: DC

## 2019-11-03 NOTE — Patient Instructions (Signed)
It was great seeing you today   Please start exercising to help with balance issues   Also take Prilosec OTC for two weeks to see if this helps with the choking sensation   We will follow up with you regarding your labs

## 2019-11-03 NOTE — Progress Notes (Signed)
Subjective:    Patient ID: [name], female    DOB: Aug 28, 1950, 69 y.o.   MRN: [id number]  HPI Patient presents for yearly preventative medicine examination. She is a pleasant 69 year old female who  has a past medical history of Allergy, Anemia, Anxiety, ANXIETY DEPRESSION (09/15/2007), Arthritis, ASTHMA (05/15/2009), Asthma, DIABETES MELLITUS, TYPE II (12/11/2006), DIVERTICULOSIS, COLON (12/11/2006), Edema (01/15/2009), Gout, Headache(784.0) (12/11/2006), Hyperlipidemia, HYPERTENSION NEC (08/14/2007), HYPOTHYROIDISM (12/11/2006), PELVIC PAIN, CHRONIC (08/14/2007), and Restless leg syndrome.  DM -currently prescribed Metformin 500 mg daily.  She has been well controlled on this medication.  She denies side effects.  She does not monitor her blood sugars on a daily basis but when she does monitor them they are within normal limits.  She denies episodes of hypoglycemia. Lab Results  Component Value Date   HGBA1C 6.7 07/16/2019   Hypothyroidism-controlled with Synthroid 88 mcg daily.  Essential hypertension-he takes Hyzaar 100-25 mg  daily.  She denies chest pain, shortness of breath, dizziness, lightheadedness, or headaches. BP Readings from Last 3 Encounters:  11/03/19 136/84  09/30/19 138/78  07/16/19 (!) 144/76   Restless leg syndrome-takes 1 mg of Mirapex nightly.  She does feel well controlled on this medication  Anxiety and depression-currently prescribed Celexa 20 mg daily She feels well controlled   Hyperlipidemia-prescribed simvastatin 5 mg daily.  She denies myalgia or fatigue Lab Results  Component Value Date   CHOL 164 10/29/2018   HDL 40.40 10/29/2018   LDLCALC 85 06/13/2017   LDLDIRECT 68.0 10/29/2018   TRIG 370.0 (H) 10/29/2018   CHOLHDL 4 10/29/2018    Acute Issues   Balance Issues - she reports that over the last three days she has felt off balance. She denies falls or feeling dizzy. Reports that it is noticeable when changing positions from a sitting to  walking as well as walking. Patient reports that she will be walking and " all of a sudden take a step to the side"   Dysphagia - has been present for a few weeks. She feels as though she is getting choked up when she eats, drinks or sometimes breathes. This is happening every time she eats or drinks. Denies actually choking, nausea or vomiting. Has a constant feeling of having to clear her throat. She denies other GERD symptoms.   All immunizations and health maintenance protocols were reviewed with the patient and needed orders were placed.  Appropriate screening laboratory values were ordered for the patient including screening of hyperlipidemia, renal function and hepatic function.  Medication reconciliation,  past medical history, social history, problem list and allergies were reviewed in detail with the patient  Goals were established with regard to weight loss, exercise, and  diet in compliance with medications Wt Readings from Last 3 Encounters:  11/03/19 182 lb (82.6 kg)  09/30/19 180 lb (81.6 kg)  09/16/19 180 lb (81.6 kg)   She is up-to-date on routine colon cancer screening as well as mammogram and bone density screen   Review of Systems  Constitutional: Negative.   HENT: Negative.   Eyes: Negative.   Respiratory: Negative.   Cardiovascular: Negative.   Gastrointestinal: Negative.   Endocrine: Negative.   Genitourinary: Negative.   Musculoskeletal: Negative.   Skin: Negative.   Allergic/Immunologic: Negative.   Neurological: Negative.   Hematological: Negative.   Psychiatric/Behavioral: Positive for dysphoric mood. The patient is nervous/anxious.    Past Medical History:  Diagnosis Date   Allergy    Anemia    Anxiety  ANXIETY DEPRESSION 09/15/2007   Arthritis    ASTHMA 05/15/2009   Asthma    DIABETES MELLITUS, TYPE II 12/11/2006   DIVERTICULOSIS, COLON 12/11/2006   Edema 01/15/2009   Gout    pt denies; toe was broken   Headache(784.0) 12/11/2006    Hyperlipidemia    HYPERTENSION NEC 08/14/2007   HYPOTHYROIDISM 12/11/2006   PELVIC PAIN, CHRONIC 08/14/2007   Restless leg syndrome     Social History   Socioeconomic History   Marital status: Married    Spouse name: Not on file   Number of children: Not on file   Years of education: Not on file   Highest education level: Not on file  Occupational History   Not on file  Tobacco Use   Smoking status: Never Smoker   Smokeless tobacco: Never Used  Substance and Sexual Activity   Alcohol use: Yes    Alcohol/week: 0.0 standard drinks    Comment: very rare   Drug use: No   Sexual activity: Not on file  Other Topics Concern   Not on file  Social History Narrative   Not on file   Social Determinants of Health   Financial Resource Strain:    Difficulty of Paying Living Expenses:   Food Insecurity:    Worried About Charity fundraiser in the Last Year:    Arboriculturist in the Last Year:   Transportation Needs:    Film/video editor (Medical):    Lack of Transportation (Non-Medical):   Physical Activity:    Days of Exercise per Week:    Minutes of Exercise per Session:   Stress:    Feeling of Stress :   Social Connections:    Frequency of Communication with Friends and Family:    Frequency of Social Gatherings with Friends and Family:    Attends Religious Services:    Active Member of Clubs or Organizations:    Attends Music therapist:    Marital Status:   Intimate Partner Violence:    Fear of Current or Ex-Partner:    Emotionally Abused:    Physically Abused:    Sexually Abused:     Past Surgical History:  Procedure Laterality Date   ABDOMINAL HYSTERECTOMY     APPENDECTOMY     Bladder Suspension      Family History  Problem Relation Age of Onset   COPD Mother        smoker   Diabetes Mother    COPD Father        smoker   Diabetes Father    Diabetes Sister    Diabetes Mellitus II Sister     Diabetes Sister    Diabetes Sister    Diabetes Sister    Breast cancer Other 13   Lung cancer Nephew 60   Colon cancer Neg Hx    Esophageal cancer Neg Hx    Pancreatic cancer Neg Hx     Allergies  Allergen Reactions   Benadryl [Diphenhydramine Hcl] Anaphylaxis   Ace Inhibitors Cough   Amoxicillin    Aspirin    Chlorphen-Phenyleph-Methscop     REACTION: rash   Codeine    Diphenhydramine Hcl     REACTION: swelling-mouth   Fish Allergy    Hydrocodone    Indomethacin    Pentazocine Lactate     REACTION: passing out   Promethazine Hcl    Quinine     REACTION: itching redface    Current Outpatient Medications on File  Prior to Visit  Medication Sig Dispense Refill   acetaminophen (TYLENOL) 500 MG tablet Take 1,000 mg by mouth every 8 (eight) hours as needed.     aspirin 81 MG tablet Take 81 mg by mouth at bedtime.     cholecalciferol (VITAMIN D3) 25 MCG (1000 UT) tablet Take 1,000 Units by mouth daily.     citalopram (CELEXA) 20 MG tablet Take 1 tablet (20 mg total) by mouth daily. 90 tablet 0   Cyanocobalamin (VITAMIN B-12 PO) Take 1 tablet by mouth daily.     glucose blood (ONETOUCH VERIO) test strip Test twice daily. 100 each 5   levothyroxine (SYNTHROID) 88 MCG tablet TAKE 1 TABLET BY MOUTH DAILY 90 tablet 3   losartan-hydrochlorothiazide (HYZAAR) 100-25 MG tablet TAKE 1 TABLET BY MOUTH DAILY 90 tablet 2   metFORMIN (GLUCOPHAGE) 500 MG tablet TAKE 1 TABLET(500 MG) BY MOUTH TWICE DAILY WITH A MEAL 180 tablet 0   pramipexole (MIRAPEX) 1 MG tablet TAKE 1 TABLET BY MOUTH DAILY 90 tablet 3   Prenatal Multivit-Min-Fe-FA (PRE-NATAL PO)      simvastatin (ZOCOR) 5 MG tablet TAKE 1 TABLET BY MOUTH DAILY 90 tablet 0   No current facility-administered medications on file prior to visit.    BP 136/84    Temp 98.8 F (37.1 C)    Ht 5' 3.5" (1.613 m)    Wt 182 lb (82.6 kg)    BMI 31.73 kg/m       Objective:   Physical Exam Vitals and nursing  note reviewed.  Constitutional:      General: She is not in acute distress.    Appearance: Normal appearance. She is well-developed. She is not ill-appearing.  HENT:     Head: Normocephalic and atraumatic.     Right Ear: Tympanic membrane, ear canal and external ear normal. There is no impacted cerumen.     Left Ear: Tympanic membrane, ear canal and external ear normal. There is no impacted cerumen.     Nose: Nose normal. No congestion or rhinorrhea.     Mouth/Throat:     Mouth: Mucous membranes are moist.     Pharynx: Oropharynx is clear. No oropharyngeal exudate or posterior oropharyngeal erythema.  Eyes:     General:        Right eye: No discharge.        Left eye: No discharge.     Extraocular Movements: Extraocular movements intact.     Conjunctiva/sclera: Conjunctivae normal.     Pupils: Pupils are equal, round, and reactive to light.  Neck:     Thyroid: No thyromegaly.     Vascular: No carotid bruit.     Trachea: No tracheal deviation.  Cardiovascular:     Rate and Rhythm: Normal rate and regular rhythm.     Pulses: Normal pulses.     Heart sounds: Normal heart sounds. No murmur heard.  No friction rub. No gallop.   Pulmonary:     Effort: Pulmonary effort is normal. No respiratory distress.     Breath sounds: Normal breath sounds. No stridor. No wheezing, rhonchi or rales.  Chest:     Chest wall: No tenderness.  Abdominal:     General: Abdomen is flat. Bowel sounds are normal. There is no distension.     Palpations: Abdomen is soft. There is no mass.     Tenderness: There is no abdominal tenderness. There is no right CVA tenderness, left CVA tenderness, guarding or rebound.     Hernia:  No hernia is present.  Musculoskeletal:        General: No swelling, tenderness, deformity or signs of injury. Normal range of motion.     Cervical back: Normal range of motion and neck supple.     Right lower leg: No edema.     Left lower leg: No edema.  Lymphadenopathy:      Cervical: No cervical adenopathy.  Skin:    General: Skin is warm and dry.     Coloration: Skin is not jaundiced or pale.     Findings: No bruising, erythema, lesion or rash.  Neurological:     General: No focal deficit present.     Mental Status: She is alert and oriented to person, place, and time.     Cranial Nerves: No cranial nerve deficit.     Sensory: No sensory deficit.     Motor: Motor function is intact. No weakness.     Coordination: Coordination is intact. Romberg sign negative. Coordination normal.     Gait: Gait and tandem walk normal.     Deep Tendon Reflexes: Reflexes normal.  Psychiatric:        Mood and Affect: Mood normal.        Behavior: Behavior normal.        Thought Content: Thought content normal.        Judgment: Judgment normal.       Assessment & Plan:  1. Controlled type 2 diabetes mellitus with complication, without long-term current use of insulin ([place]) - Consider increase in Metformin  - CBC with Differential/Platelet - Comprehensive metabolic panel - Lipid panel - TSH - CMP with eGFR([facility])  2. Anxiety and depression - Continue with Celexa 20 mg   3. Essential hypertension - No change in medications  - CBC with Differential/Platelet - Comprehensive metabolic panel - Lipid panel - TSH - CMP with eGFR([facility])  4. Restless leg syndrome  - pramipexole (MIRAPEX) 1 MG tablet; Take 1 tablet (1 mg total) by mouth daily.  Dispense: 90 tablet; Refill: 1  5. Other specified hypothyroidism - Consider dose change of synthroid  - CBC with Differential/Platelet - Comprehensive metabolic panel - Lipid panel - TSH - CMP with eGFR([facility])  6. Mixed hyperlipidemia - Consider increase in statin  - CBC with Differential/Platelet - Comprehensive metabolic panel - Lipid panel - TSH - CMP with eGFR([facility])  7. Dysphagia, unspecified type - Possible GERD? Will have her start OTC prilosec x 2 weeks. If no improvement then follow up with GI  8. Gait  instability - Negative neuro exam  - Will have her try doing more exercise/core strengthening  - Follow up if no improvement

## 2019-11-03 NOTE — Addendum Note (Signed)
Addended by: Miles Costain T on: 11/03/2019 01:46 PM   Modules accepted: Orders

## 2019-11-04 ENCOUNTER — Other Ambulatory Visit: Payer: Self-pay | Admitting: Adult Health

## 2019-11-04 DIAGNOSIS — Z760 Encounter for issue of repeat prescription: Secondary | ICD-10-CM

## 2019-11-04 LAB — COMPREHENSIVE METABOLIC PANEL
AG Ratio: 2 (calc) (ref 1.0–2.5)
ALT: 17 U/L (ref 6–29)
AST: 13 U/L (ref 10–35)
Albumin: 3.9 g/dL (ref 3.6–5.1)
Alkaline phosphatase (APISO): 72 U/L (ref 37–153)
BUN: 15 mg/dL (ref 7–25)
CO2: 29 mmol/L (ref 20–32)
Calcium: 9.1 mg/dL (ref 8.6–10.4)
Chloride: 103 mmol/L (ref 98–110)
Creat: 0.86 mg/dL (ref 0.50–0.99)
Globulin: 2 g/dL (calc) (ref 1.9–3.7)
Glucose, Bld: 146 mg/dL — ABNORMAL HIGH (ref 65–99)
Potassium: 4.5 mmol/L (ref 3.5–5.3)
Sodium: 141 mmol/L (ref 135–146)
Total Bilirubin: 0.5 mg/dL (ref 0.2–1.2)
Total Protein: 5.9 g/dL — ABNORMAL LOW (ref 6.1–8.1)

## 2019-11-04 LAB — LIPID PANEL
Cholesterol: 142 mg/dL (ref <200)
HDL: 49 mg/dL — ABNORMAL LOW (ref >=50)
LDL Cholesterol (Calc): 67 mg/dL (calc)
Non-HDL Cholesterol (Calc): 93 mg/dL (calc) (ref <130)
Total CHOL/HDL Ratio: 2.9 (calc) (ref <5.0)
Triglycerides: 193 mg/dL — ABNORMAL HIGH (ref <150)

## 2019-11-04 LAB — TSH: TSH: 1.34 mIU/L (ref 0.40–4.50)

## 2019-11-04 MED ORDER — LEVOTHYROXINE SODIUM 88 MCG PO TABS: 88.0000 ug | ORAL_TABLET | Freq: Every day | ORAL | 3 refills | Status: DC

## 2019-11-05 LAB — TEST AUTHORIZATION

## 2019-11-05 LAB — CBC WITH DIFFERENTIAL/PLATELET
Absolute Monocytes: 416 cells/uL (ref 200–950)
Basophils Absolute: 38 cells/uL (ref 0–200)
Basophils Relative: 0.6 %
Eosinophils Absolute: 198 cells/uL (ref 15–500)
Eosinophils Relative: 3.1 %
HCT: 37.9 % (ref 35.0–45.0)
Hemoglobin: 12.2 g/dL (ref 11.7–15.5)
Lymphs Abs: 1504 cells/uL (ref 850–3900)
MCH: 29.3 pg (ref 27.0–33.0)
MCHC: 32.2 g/dL (ref 32.0–36.0)
MCV: 90.9 fL (ref 80.0–100.0)
MPV: 9.3 fL (ref 7.5–12.5)
Monocytes Relative: 6.5 %
Neutro Abs: 4243 cells/uL (ref 1500–7800)
Neutrophils Relative %: 66.3 %
Platelets: 208 10*3/uL (ref 140–400)
RBC: 4.17 10*6/uL (ref 3.80–5.10)
RDW: 13.3 % (ref 11.0–15.0)
Total Lymphocyte: 23.5 %
WBC: 6.4 10*3/uL (ref 3.8–10.8)

## 2019-11-05 LAB — HEMOGLOBIN A1C W/OUT EAG: Hgb A1c MFr Bld: 6.9 % of total Hgb — ABNORMAL HIGH (ref <5.7)

## 2019-11-29 ENCOUNTER — Encounter: Payer: Self-pay | Admitting: Adult Health

## 2019-12-15 ENCOUNTER — Encounter: Payer: Self-pay | Admitting: Adult Health

## 2019-12-23 ENCOUNTER — Other Ambulatory Visit: Payer: Self-pay | Admitting: Adult Health

## 2019-12-23 DIAGNOSIS — Z760 Encounter for issue of repeat prescription: Secondary | ICD-10-CM

## 2019-12-23 DIAGNOSIS — E118 Type 2 diabetes mellitus with unspecified complications: Secondary | ICD-10-CM

## 2019-12-30 ENCOUNTER — Telehealth: Payer: Self-pay | Admitting: Adult Health

## 2019-12-30 ENCOUNTER — Other Ambulatory Visit: Payer: Self-pay | Admitting: Adult Health

## 2019-12-30 DIAGNOSIS — F32A Depression, unspecified: Secondary | ICD-10-CM

## 2019-12-30 DIAGNOSIS — F419 Anxiety disorder, unspecified: Secondary | ICD-10-CM

## 2019-12-30 NOTE — Telephone Encounter (Signed)
Spoke with patient she ask me to call her back at 3pm

## 2020-01-01 ENCOUNTER — Ambulatory Visit (INDEPENDENT_AMBULATORY_CARE_PROVIDER_SITE_OTHER): Payer: Medicare Other

## 2020-01-01 ENCOUNTER — Other Ambulatory Visit: Payer: Self-pay

## 2020-01-01 DIAGNOSIS — Z0000 Encounter for general adult medical examination without abnormal findings: Secondary | ICD-10-CM | POA: Diagnosis not present

## 2020-01-01 DIAGNOSIS — Z1231 Encounter for screening mammogram for malignant neoplasm of breast: Secondary | ICD-10-CM

## 2020-01-01 NOTE — Patient Instructions (Signed)
[name] , Thank you for taking time to come for your Medicare Wellness Visit. I appreciate your ongoing commitment to your health goals. Please review the following plan we discussed and let me know if I can assist you in the future.   Screening recommendations/referrals: Colonoscopy: Up to date, next due 10/09/2022 Mammogram: Currently due, orders placed this visit Bone Density: No longer required  Recommended yearly ophthalmology/optometry visit for glaucoma screening and checkup Recommended yearly dental visit for hygiene and checkup  Vaccinations: Influenza vaccine: Currently due for 2021-2022 Flu vaccine. You may receive at your next office visit or at your local pharmacy. Pneumococcal vaccine: completed series Tdap vaccine: Up to date, next due 03/20/2022 Shingles vaccine: Currently due for Shingrix, you may contact your insurance company to discuss cost and to receive vaccine    Advanced directives: Advance directive discussed with you today. Even though you declined this today please call our office should you change your mind and we can give you the proper paperwork for you to fill out.   Conditions/risks identified: None   Next appointment: None    Preventive Care 65 Years and Older, Female Preventive care refers to lifestyle choices and visits with your health care provider that can promote health and wellness. What does preventive care include?  A yearly physical exam. This is also called an annual well check.  Dental exams once or twice a year.  Routine eye exams. Ask your health care provider how often you should have your eyes checked.  Personal lifestyle choices, including:  Daily care of your teeth and gums.  Regular physical activity.  Eating a healthy diet.  Avoiding tobacco and drug use.  Limiting alcohol use.  Practicing safe sex.  Taking low-dose aspirin every day.  Taking vitamin and mineral supplements as recommended by your health care  provider. What happens during an annual well check? The services and screenings done by your health care provider during your annual well check will depend on your age, overall health, lifestyle risk factors, and family history of disease. Counseling  Your health care provider may ask you questions about your:  Alcohol use.  Tobacco use.  Drug use.  Emotional well-being.  Home and relationship well-being.  Sexual activity.  Eating habits.  History of falls.  Memory and ability to understand (cognition).  Work and work Statistician.  Reproductive health. Screening  You may have the following tests or measurements:  Height, weight, and BMI.  Blood pressure.  Lipid and cholesterol levels. These may be checked every 5 years, or more frequently if you are over 51 years old.  Skin check.  Lung cancer screening. You may have this screening every year starting at age 72 if you have a 30-pack-year history of smoking and currently smoke or have quit within the past 15 years.  Fecal occult blood test (FOBT) of the stool. You may have this test every year starting at age 7.  Flexible sigmoidoscopy or colonoscopy. You may have a sigmoidoscopy every 5 years or a colonoscopy every 10 years starting at age 17.  Hepatitis C blood test.  Hepatitis B blood test.  Sexually transmitted disease (STD) testing.  Diabetes screening. This is done by checking your blood sugar (glucose) after you have not eaten for a while (fasting). You may have this done every 1-3 years.  Bone density scan. This is done to screen for osteoporosis. You may have this done starting at age 96.  Mammogram. This may be done every 1-2 years.  Talk to your health care provider about how often you should have regular mammograms. Talk with your health care provider about your test results, treatment options, and if necessary, the need for more tests. Vaccines  Your health care provider may recommend certain  vaccines, such as:  Influenza vaccine. This is recommended every year.  Tetanus, diphtheria, and acellular pertussis (Tdap, Td) vaccine. You may need a Td booster every 10 years.  Zoster vaccine. You may need this after age 33.  Pneumococcal 13-valent conjugate (PCV13) vaccine. One dose is recommended after age 24.  Pneumococcal polysaccharide (PPSV23) vaccine. One dose is recommended after age 55. Talk to your health care provider about which screenings and vaccines you need and how often you need them. This information is not intended to replace advice given to you by your health care provider. Make sure you discuss any questions you have with your health care provider. Document Released: 04/29/2015 Document Revised: 12/21/2015 Document Reviewed: 02/01/2015 Elsevier Interactive Patient Education  2017 [employer] Prevention in the Home Falls can cause injuries. They can happen to people of all ages. There are many things you can do to make your home safe and to help prevent falls. What can I do on the outside of my home?  Regularly fix the edges of walkways and driveways and fix any cracks.  Remove anything that might make you trip as you walk through a door, such as a raised step or threshold.  Trim any bushes or trees on the path to your home.  Use bright outdoor lighting.  Clear any walking paths of anything that might make someone trip, such as rocks or tools.  Regularly check to see if handrails are loose or broken. Make sure that both sides of any steps have handrails.  Any raised decks and porches should have guardrails on the edges.  Have any leaves, snow, or ice cleared regularly.  Use sand or salt on walking paths during winter.  Clean up any spills in your garage right away. This includes oil or grease spills. What can I do in the bathroom?  Use night lights.  Install grab bars by the toilet and in the tub and shower. Do not use towel bars as grab  bars.  Use non-skid mats or decals in the tub or shower.  If you need to sit down in the shower, use a plastic, non-slip stool.  Keep the floor dry. Clean up any water that spills on the floor as soon as it happens.  Remove soap buildup in the tub or shower regularly.  Attach bath mats securely with double-sided non-slip rug tape.  Do not have throw rugs and other things on the floor that can make you trip. What can I do in the bedroom?  Use night lights.  Make sure that you have a light by your bed that is easy to reach.  Do not use any sheets or blankets that are too big for your bed. They should not hang down onto the floor.  Have a firm chair that has side arms. You can use this for support while you get dressed.  Do not have throw rugs and other things on the floor that can make you trip. What can I do in the kitchen?  Clean up any spills right away.  Avoid walking on wet floors.  Keep items that you use a lot in easy-to-reach places.  If you need to reach something above you, use a strong step stool  that has a grab bar.  Keep electrical cords out of the way.  Do not use floor polish or wax that makes floors slippery. If you must use wax, use non-skid floor wax.  Do not have throw rugs and other things on the floor that can make you trip. What can I do with my stairs?  Do not leave any items on the stairs.  Make sure that there are handrails on both sides of the stairs and use them. Fix handrails that are broken or loose. Make sure that handrails are as long as the stairways.  Check any carpeting to make sure that it is firmly attached to the stairs. Fix any carpet that is loose or worn.  Avoid having throw rugs at the top or bottom of the stairs. If you do have throw rugs, attach them to the floor with carpet tape.  Make sure that you have a light switch at the top of the stairs and the bottom of the stairs. If you do not have them, ask someone to add them for  you. What else can I do to help prevent falls?  Wear shoes that:  Do not have high heels.  Have rubber bottoms.  Are comfortable and fit you well.  Are closed at the toe. Do not wear sandals.  If you use a stepladder:  Make sure that it is fully opened. Do not climb a closed stepladder.  Make sure that both sides of the stepladder are locked into place.  Ask someone to hold it for you, if possible.  Clearly mark and make sure that you can see:  Any grab bars or handrails.  First and last steps.  Where the edge of each step is.  Use tools that help you move around (mobility aids) if they are needed. These include:  Canes.  Walkers.  Scooters.  Crutches.  Turn on the lights when you go into a dark area. Replace any light bulbs as soon as they burn out.  Set up your furniture so you have a clear path. Avoid moving your furniture around.  If any of your floors are uneven, fix them.  If there are any pets around you, be aware of where they are.  Review your medicines with your doctor. Some medicines can make you feel dizzy. This can increase your chance of falling. Ask your doctor what other things that you can do to help prevent falls. This information is not intended to replace advice given to you by your health care provider. Make sure you discuss any questions you have with your health care provider. Document Released: 01/27/2009 Document Revised: 09/08/2015 Document Reviewed: 05/07/2014 Elsevier Interactive Patient Education  2017 [employer].

## 2020-01-01 NOTE — Progress Notes (Signed)
Subjective:   [name] is a 69 y.o. female who presents for an Initial Medicare Annual Wellness Visit.  I connected with [name] today by telephone and verified that I am speaking with the correct person using two identifiers. Location patient: home Location provider: work Persons participating in the virtual visit: patient, provider.   I discussed the limitations, risks, security and privacy concerns of performing an evaluation and management service by telephone and the availability of in person appointments. I also discussed with the patient that there may be a patient responsible charge related to this service. The patient expressed understanding and verbally consented to this telephonic visit.    Interactive audio and video telecommunications were attempted between this provider and patient, however failed, due to patient having technical difficulties OR patient did not have access to video capability.  We continued and completed visit with audio only.      Review of Systems    N/A Cardiac Risk Factors include: advanced age (>24men, >28 women);diabetes mellitus;hypertension     Objective:    Today's Vitals   There is no height or weight on file to calculate BMI.  Advanced Directives 01/01/2020 10/16/2016 05/17/2015 11/19/2014  Does Patient Have a Medical Advance Directive? No No No No  Would patient like information on creating a medical advance directive? No - Patient declined - No - patient declined information Yes - Educational materials given    Current Medications (verified) Outpatient Encounter Medications as of 01/01/2020  Medication Sig   acetaminophen (TYLENOL) 500 MG tablet Take 1,000 mg by mouth every 8 (eight) hours as needed.   aspirin 81 MG tablet Take 81 mg by mouth at bedtime.   cholecalciferol (VITAMIN D3) 25 MCG (1000 UT) tablet Take 1,000 Units by mouth daily.   citalopram (CELEXA) 20 MG tablet TAKE 1 TABLET(20 MG) BY MOUTH  DAILY   Cyanocobalamin (VITAMIN B-12 PO) Take 1 tablet by mouth daily.   glucose blood (ONETOUCH VERIO) test strip Test twice daily.   levothyroxine (SYNTHROID) 88 MCG tablet Take 1 tablet (88 mcg total) by mouth daily.   losartan-hydrochlorothiazide (HYZAAR) 100-25 MG tablet TAKE 1 TABLET BY MOUTH DAILY   metFORMIN (GLUCOPHAGE) 500 MG tablet TAKE 1 TABLET(500 MG) BY MOUTH TWICE DAILY WITH A MEAL   pramipexole (MIRAPEX) 1 MG tablet Take 1 tablet (1 mg total) by mouth daily.   Prenatal Multivit-Min-Fe-FA (PRE-NATAL PO)    simvastatin (ZOCOR) 5 MG tablet TAKE 1 TABLET BY MOUTH DAILY   No facility-administered encounter medications on file as of 01/01/2020.    Allergies (verified) Benadryl [diphenhydramine hcl], Ace inhibitors, Amoxicillin, Aspirin, Chlorphen-phenyleph-methscop, Codeine, Diphenhydramine hcl, Fish allergy, Hydrocodone, Indomethacin, Pentazocine lactate, Promethazine hcl, and Quinine   History: Past Medical History:  Diagnosis Date   Allergy    Anemia    Anxiety    ANXIETY DEPRESSION 09/15/2007   Arthritis    ASTHMA 05/15/2009   Asthma    DIABETES MELLITUS, TYPE II 12/11/2006   DIVERTICULOSIS, COLON 12/11/2006   Edema 01/15/2009   Gout    pt denies; toe was broken   Headache(784.0) 12/11/2006   Hyperlipidemia    HYPERTENSION NEC 08/14/2007   HYPOTHYROIDISM 12/11/2006   PELVIC PAIN, CHRONIC 08/14/2007   Restless leg syndrome    Past Surgical History:  Procedure Laterality Date   ABDOMINAL HYSTERECTOMY     APPENDECTOMY     Bladder Suspension     Family History  Problem Relation Age of Onset   COPD Mother  smoker   Diabetes Mother    COPD Father        smoker   Diabetes Father    Diabetes Sister    Diabetes Mellitus II Sister    Diabetes Sister    Diabetes Sister    Diabetes Sister    Breast cancer Other 24   Lung cancer Nephew 60   Colon cancer Neg Hx    Esophageal cancer Neg Hx    Pancreatic cancer Neg Hx     Social History   Socioeconomic History   Marital status: Married    Spouse name: Not on file   Number of children: Not on file   Years of education: Not on file   Highest education level: Not on file  Occupational History   Not on file  Tobacco Use   Smoking status: Never Smoker   Smokeless tobacco: Never Used  Substance and Sexual Activity   Alcohol use: Yes    Alcohol/week: 0.0 standard drinks    Comment: very rare   Drug use: No   Sexual activity: Not on file  Other Topics Concern   Not on file  Social History Narrative   Not on file   Social Determinants of Health   Financial Resource Strain: Low Risk    Difficulty of Paying Living Expenses: Not hard at all  Food Insecurity: No Food Insecurity   Worried About Charity fundraiser in the Last Year: Never true    in the Last Year: Never true  Transportation Needs: No Transportation Needs   Lack of Transportation (Medical): No   Lack of Transportation (Non-Medical): No  Physical Activity: Inactive   Days of Exercise per Week: 0 days   Minutes of Exercise per Session: 0 min  Stress: No Stress Concern Present   Feeling of Stress : Not at all  Social Connections: Moderately Integrated   Frequency of Communication with Friends and Family: More than three times a week   Frequency of Social Gatherings with Friends and Family: More than three times a week   Attends Religious Services: More than 4 times per year   Active Member of [employer] or Organizations: No   Attends Music therapist: Never   Marital Status: Married    Tobacco Counseling Counseling given: Not Answered   Clinical Intake:  Pre-visit preparation completed: Yes  Pain : No/denies pain     Nutritional Risks: Nausea/ vomitting/ diarrhea (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 bring in CBG monitor from home?: No  How often do you  need to have someone help you when you read instructions, pamphlets, or other written materials from your doctor or pharmacy?: 1 - Never What is the last grade level you completed in school?: High School Graduate  Diabetic?Yes  Interpreter Needed?: No  Information entered by :: [place] of Daily Living In your present state of health, do you have any difficulty performing the following activities: 01/01/2020  Hearing? Y  Comment has had tubes in ears previously has bilateral hearing loss since tubes  Vision? N  Difficulty concentrating or making decisions? Y  Comment Has occassional issues with memory  Walking or climbing stairs? N  Dressing or bathing? N  Doing errands, shopping? N  Preparing Food and eating ? N  Using the Toilet? N  In the past six months, have you accidently leaked urine? Y  Comment Has occasional bladder  leakage and occassional wets the bed  Do you have problems with loss of bowel control? N  Managing your Medications? N  Managing your Finances? N  Housekeeping or managing your Housekeeping? N  Some recent data might be hidden    Patient Care Team: [name], NP as PCP - General (Family Medicine)  Indicate any recent Medical Services you may have received from other than [facility] providers in the past year (date may be approximate).     Assessment:   This is a routine wellness examination for [name].  Hearing/Vision screen  Hearing Screening   125Hz  250Hz  500Hz  1000Hz  2000Hz  3000Hz  4000Hz  6000Hz  8000Hz   Right ear:           Left ear:           Vision Screening Comments: Patient states has not had eye checked in a few years    Dietary issues and exercise activities discussed: Current Exercise Habits: The patient does not participate in regular exercise at present  Goals     Weight (lb) < 170 lb (77.1 kg)      Depression Screen PHQ 2/9 Scores 01/01/2020 11/03/2019 11/03/2019 06/24/2018 06/22/2016 04/26/2015 01/28/2015  PHQ - 2  Score 2 0 0 0 0 0 1  PHQ- 9 Score 2 - - - - - -    Fall Risk Fall Risk  01/01/2020 11/03/2019 11/03/2019 06/24/2018 06/22/2016  Falls in the past year? 0 0 0 0 No  Number falls in past yr: 0 - - 0 -  Injury with Fall? 0 - - - -  Risk for fall due to : Impaired balance/gait - - - -  Follow up Falls evaluation completed;Falls prevention discussed - - Falls evaluation completed -    Any stairs in or around the home? No  If so, are there any without handrails? No  Home free of loose throw rugs in walkways, pet beds, electrical cords, etc? Yes  Adequate lighting in your home to reduce risk of falls? Yes   ASSISTIVE DEVICES UTILIZED TO PREVENT FALLS:  Life alert? No  Use of a cane, walker or w/c? No  Grab bars in the bathroom? Yes  Shower chair or bench in shower? Yes  Elevated toilet seat or a handicapped toilet? No  Cognitive Function:     6CIT Screen 01/01/2020  What Year? 0 points  What month? 0 points  What time? 0 points  Count back from 20 0 points  Months in reverse 0 points  Repeat phrase 0 points  Total Score 0    Immunizations Immunization History  Administered Date(s) Administered   Influenza Whole 01/18/2009   Influenza, High Dose Seasonal PF 04/22/2018   Influenza,inj,Quad PF,6+ Mos 05/20/2014, 01/23/2017   Influenza-Unspecified 01/15/2015   PFIZER SARS-COV-2 Vaccination 06/27/2019, 07/21/2019   Pneumococcal Conjugate-13 04/22/2018   Pneumococcal Polysaccharide-23 05/20/2014, 11/03/2019   Td 04/16/2005, 03/20/2012   Zoster 05/28/2014    TDAP status: Up to date Flu Vaccine status: Up to date Pneumococcal vaccine status: Up to date Covid-19 vaccine status: Completed vaccines  Qualifies for Shingles Vaccine? Yes   Zostavax completed Yes   Shingrix Completed?: No.    Education has been provided regarding the importance of this vaccine. Patient has been advised to call insurance company to determine out of pocket expense if they have not yet received  this vaccine. Advised may also receive vaccine at local pharmacy or Health Dept. Verbalized acceptance and understanding.  Screening Tests Health Maintenance  Topic Date Due  OPHTHALMOLOGY EXAM  01/24/2019   INFLUENZA VACCINE  11/15/2019   HEMOGLOBIN A1C  05/05/2020   MAMMOGRAM  10/14/2020   FOOT EXAM  11/02/2020   TETANUS/TDAP  03/20/2022   COLONOSCOPY  10/09/2022   DEXA SCAN  Completed   COVID-19 Vaccine  Completed   Hepatitis C Screening  Completed   PNA vac Low Risk Adult  Completed    Health Maintenance  Health Maintenance Due  Topic Date Due   OPHTHALMOLOGY EXAM  01/24/2019   INFLUENZA VACCINE  11/15/2019    Colorectal cancer screening: Completed 10/08/2017. Repeat every 5 years Mammogram status: Ordered 01/01/2020. Pt provided with contact info and advised to call to schedule appt.  Bone Density status: Completed 10/15/2018. Results reflect: Bone density results: NORMAL. Repeat every 5 years.  Lung Cancer Screening: (Low Dose CT Chest recommended if Age 6-80 years, 30 pack-year currently smoking OR have quit w/in 15years.) does not qualify.   Lung Cancer Screening Referral: N/A  Additional Screening:  Hepatitis C Screening: does qualify; Completed 06/13/2017  Vision Screening: Recommended annual ophthalmology exams for early detection of glaucoma and other disorders of the eye. Is the patient up to date with their annual eye exam?  No  Who is the provider or what is the name of the office in which the patient attends annual eye exams? [facility] If pt is not established with a provider, would they like to be referred to a provider to establish care? No .   Dental Screening: Recommended annual dental exams for proper oral hygiene  Community Resource Referral / Chronic Care Management: CRR required this visit?  No   CCM required this visit?  No      Plan:     I have personally reviewed and noted the following in the patients chart:     Medical and social history  Use of alcohol, tobacco or illicit drugs   Current medications and supplements  Functional ability and status  Nutritional status  Physical activity  Advanced directives  List of other physicians  Hospitalizations, surgeries, and ER visits in previous 12 months  Vitals  Screenings to include cognitive, depression, and falls  Referrals and appointments  In addition, I have reviewed and discussed with patient certain preventive protocols, quality metrics, and best practice recommendations. A written personalized care plan for preventive services as well as general preventive health recommendations were provided to patient.     Ofilia Neas, LPN   12/11/32   Nurse Notes: Patient has concerns of

## 2020-01-14 ENCOUNTER — Other Ambulatory Visit: Payer: Self-pay | Admitting: Adult Health

## 2020-01-26 ENCOUNTER — Ambulatory Visit
Admission: RE | Admit: 2020-01-26 | Discharge: 2020-01-26 | Disposition: A | Discharge status: Home / Self Care | Payer: Medicare Other | Source: Ambulatory Visit | Attending: Adult Health | Admitting: Adult Health

## 2020-01-26 ENCOUNTER — Other Ambulatory Visit: Payer: Self-pay

## 2020-01-26 ENCOUNTER — Other Ambulatory Visit: Payer: Self-pay | Admitting: Adult Health

## 2020-01-26 DIAGNOSIS — Z1231 Encounter for screening mammogram for malignant neoplasm of breast: Secondary | ICD-10-CM

## 2020-03-15 ENCOUNTER — Encounter: Payer: Self-pay | Admitting: Adult Health

## 2020-03-23 DIAGNOSIS — Z23 Encounter for immunization: Secondary | ICD-10-CM | POA: Diagnosis not present

## 2020-03-28 ENCOUNTER — Other Ambulatory Visit: Payer: Self-pay | Admitting: Adult Health

## 2020-03-28 DIAGNOSIS — F419 Anxiety disorder, unspecified: Secondary | ICD-10-CM

## 2020-04-06 DIAGNOSIS — Z23 Encounter for immunization: Secondary | ICD-10-CM | POA: Diagnosis not present

## 2020-04-13 ENCOUNTER — Other Ambulatory Visit: Payer: Self-pay | Admitting: Adult Health

## 2020-04-18 ENCOUNTER — Other Ambulatory Visit: Payer: Self-pay | Admitting: Adult Health

## 2020-04-18 DIAGNOSIS — E118 Type 2 diabetes mellitus with unspecified complications: Secondary | ICD-10-CM

## 2020-04-18 DIAGNOSIS — Z760 Encounter for issue of repeat prescription: Secondary | ICD-10-CM

## 2020-04-18 MED ORDER — METFORMIN HCL 500 MG PO TABS: 500.0000 mg | ORAL_TABLET | Freq: Two times a day (BID) | ORAL | 0 refills | Status: DC

## 2020-04-23 ENCOUNTER — Encounter: Payer: Self-pay | Admitting: Adult Health

## 2020-04-23 DIAGNOSIS — E118 Type 2 diabetes mellitus with unspecified complications: Secondary | ICD-10-CM

## 2020-04-23 DIAGNOSIS — Z760 Encounter for issue of repeat prescription: Secondary | ICD-10-CM

## 2020-04-26 MED ORDER — METFORMIN HCL 500 MG PO TABS: 500.0000 mg | ORAL_TABLET | Freq: Two times a day (BID) | ORAL | 0 refills | Status: DC

## 2020-04-26 NOTE — Telephone Encounter (Signed)
Spoke to the pt.  Scheduled her for an appointment for A1C check.  Medication sent to the pharmacy by e-scribe.

## 2020-04-28 ENCOUNTER — Encounter: Payer: Self-pay | Admitting: Adult Health

## 2020-04-28 ENCOUNTER — Ambulatory Visit (INDEPENDENT_AMBULATORY_CARE_PROVIDER_SITE_OTHER): Payer: Medicare Other | Admitting: Adult Health

## 2020-04-28 ENCOUNTER — Other Ambulatory Visit: Payer: Self-pay

## 2020-04-28 VITALS — BP 128/74 | Temp 97.7°F | Wt 179.0 lb

## 2020-04-28 DIAGNOSIS — E782 Mixed hyperlipidemia: Secondary | ICD-10-CM

## 2020-04-28 DIAGNOSIS — E118 Type 2 diabetes mellitus with unspecified complications: Secondary | ICD-10-CM

## 2020-04-28 DIAGNOSIS — G2581 Restless legs syndrome: Secondary | ICD-10-CM | POA: Diagnosis not present

## 2020-04-28 LAB — POCT GLYCOSYLATED HEMOGLOBIN (HGB A1C): HbA1c, POC (controlled diabetic range): 6.5 % (ref 0.0–7.0)

## 2020-04-28 MED ORDER — PRAMIPEXOLE DIHYDROCHLORIDE 1 MG PO TABS: 1.0000 mg | ORAL_TABLET | Freq: Every day | ORAL | 1 refills | Status: DC

## 2020-04-28 MED ORDER — ATORVASTATIN CALCIUM 10 MG PO TABS: 10.0000 mg | ORAL_TABLET | Freq: Every day | ORAL | 3 refills | Status: DC

## 2020-04-28 NOTE — Progress Notes (Signed)
Subjective:    Patient ID: [name], female    DOB: 1950-10-08, 70 y.o.   MRN: [id number]  HPI  70 year old female who  has a past medical history of Allergy, Anemia, Anxiety, ANXIETY DEPRESSION (09/15/2007), Arthritis, ASTHMA (05/15/2009), Asthma, DIABETES MELLITUS, TYPE II (12/11/2006), DIVERTICULOSIS, COLON (12/11/2006), Edema (01/15/2009), Gout, Headache(784.0) (12/11/2006), Hyperlipidemia, HYPERTENSION NEC (08/14/2007), HYPOTHYROIDISM (12/11/2006), PELVIC PAIN, CHRONIC (08/14/2007), and Restless leg syndrome.  She presents to the office today for follow up regarding DM. She is currently prescribed Metformin 500 mg BID. She has been well controlled on this medication. She does not monitor her blood pressure at home on a regular basis. Denies episodes of hypoglycemia. She has been working on lifestyle modifications. Her last A1c in July 2021 was 6.9   Wt Readings from Last 3 Encounters:  04/28/20 179 lb (81.2 kg)  11/03/19 182 lb (82.6 kg)  09/30/19 180 lb (81.6 kg)   She also needs a refill of Mirapex and her pharmacy is no longer covering Simvastatin but will cover Atorvastatin   Review of Systems See HPI   Past Medical History:  Diagnosis Date  . Allergy   . Anemia   . Anxiety   . ANXIETY DEPRESSION 09/15/2007  . Arthritis   . ASTHMA 05/15/2009  . Asthma   . DIABETES MELLITUS, TYPE II 12/11/2006  . DIVERTICULOSIS, COLON 12/11/2006  . Edema 01/15/2009  . Gout    pt denies; toe was broken  . Headache(784.0) 12/11/2006  . Hyperlipidemia   . HYPERTENSION NEC 08/14/2007  . HYPOTHYROIDISM 12/11/2006  . PELVIC PAIN, CHRONIC 08/14/2007  . Restless leg syndrome     Social History   Socioeconomic History  . Marital status: Married    Spouse name: Not on file  . Number of children: Not on file  . Years of education: Not on file  . Highest education level: Not on file  Occupational History  . Not on file  Tobacco Use  . Smoking status: Never Smoker  . Smokeless tobacco:  Never Used  Substance and Sexual Activity  . Alcohol use: Yes    Alcohol/week: 0.0 standard drinks    Comment: very rare  . Drug use: No  . Sexual activity: Not on file  Other Topics Concern  . Not on file  Social History Narrative  . Not on file   Social Determinants of Health   Financial Resource Strain: Low Risk   . Difficulty of Paying Living Expenses: Not hard at all  Food Insecurity: No Food Insecurity  . Worried About Charity fundraiser in the Last Year: Never true  . Ran Out of Food in the Last Year: Never true  Transportation Needs: No Transportation Needs  . Lack of Transportation (Medical): No  . Lack of Transportation (Non-Medical): No  Physical Activity: Inactive  . Days of Exercise per Week: 0 days  . Minutes of Exercise per Session: 0 min  Stress: No Stress Concern Present  . Feeling of Stress : Not at all  Social Connections: Moderately Integrated  . Frequency of Communication with Friends and Family: More than three times a week  . Frequency of Social Gatherings with Friends and Family: More than three times a week  . Attends Religious Services: More than 4 times per year  . Active Member of Clubs or Organizations: No  . Attends Archivist Meetings: Never  . Marital Status: Married  Human resources officer Violence: Not At Risk  . Fear of Current or  Ex-Partner: No  . Emotionally Abused: No  . Physically Abused: No  . Sexually Abused: No    Past Surgical History:  Procedure Laterality Date  . ABDOMINAL HYSTERECTOMY    . APPENDECTOMY    . Bladder Suspension      Family History  Problem Relation Age of Onset  . COPD Mother        smoker  . Diabetes Mother   . COPD Father        smoker  . Diabetes Father   . Diabetes Sister   . Diabetes Mellitus II Sister   . Diabetes Sister   . Diabetes Sister   . Diabetes Sister   . Breast cancer Other 58  . Lung cancer Nephew 60  . Colon cancer Neg Hx   . Esophageal cancer Neg Hx   . Pancreatic  cancer Neg Hx     Allergies  Allergen Reactions  . Benadryl [Diphenhydramine Hcl] Anaphylaxis  . Ace Inhibitors Cough  . Amoxicillin   . Aspirin   . Chlorphen-Phenyleph-Methscop     REACTION: rash  . Codeine   . Diphenhydramine Hcl     REACTION: swelling-mouth  . Fish Allergy   . Hydrocodone   . Indomethacin   . Pentazocine Lactate     REACTION: passing out  . Promethazine Hcl   . Quinine     REACTION: itching redface    Current Outpatient Medications on File Prior to Visit  Medication Sig Dispense Refill  . acetaminophen (TYLENOL) 500 MG tablet Take 1,000 mg by mouth every 8 (eight) hours as needed.    Marland Kitchen aspirin 81 MG tablet Take 81 mg by mouth at bedtime.    . cholecalciferol (VITAMIN D3) 25 MCG (1000 UT) tablet Take 1,000 Units by mouth daily.    . citalopram (CELEXA) 20 MG tablet TAKE 1 TABLET(20 MG) BY MOUTH DAILY 90 tablet 1  . Cyanocobalamin (VITAMIN B-12 PO) Take 1 tablet by mouth daily.    Marland Kitchen glucose blood (ONETOUCH VERIO) test strip Test twice daily. 100 each 5  . levothyroxine (SYNTHROID) 88 MCG tablet Take 1 tablet (88 mcg total) by mouth daily. 90 tablet 3  . losartan-hydrochlorothiazide (HYZAAR) 100-25 MG tablet TAKE 1 TABLET BY MOUTH DAILY 90 tablet 1  . metFORMIN (GLUCOPHAGE) 500 MG tablet Take 1 tablet (500 mg total) by mouth 2 (two) times daily with a meal. 180 tablet 0  . pramipexole (MIRAPEX) 1 MG tablet Take 1 tablet (1 mg total) by mouth daily. 90 tablet 1  . Prenatal Multivit-Min-Fe-FA (PRE-NATAL PO)     . simvastatin (ZOCOR) 5 MG tablet TAKE 1 TABLET BY MOUTH EVERY DAY 90 tablet 3   No current facility-administered medications on file prior to visit.    BP 128/74   Temp 97.7 F (36.5 C)   Wt 179 lb (81.2 kg)   BMI 31.21 kg/m       Objective:   Physical Exam Vitals and nursing note reviewed.  Constitutional:      Appearance: Normal appearance.  Musculoskeletal:        General: Normal range of motion.  Skin:    General: Skin is warm and  dry.  Neurological:     General: No focal deficit present.     Mental Status: She is alert and oriented to person, place, and time.  Psychiatric:        Mood and Affect: Mood normal.        Behavior: Behavior normal.  Thought Content: Thought content normal.        Judgment: Judgment normal.       Assessment & Plan:  1. Controlled type 2 diabetes mellitus with complication, without long-term current use of insulin (HCC)  - POCT A1C- 6.5 - has improved and at goal.  - Continue with Metformin 500 mg BID  - Follow up in 6 months for CPE   2. Mixed hyperlipidemia  - atorvastatin (LIPITOR) 10 MG tablet; Take 1 tablet (10 mg total) by mouth daily.  Dispense: 90 tablet; Refill: 3  3. Restless leg syndrome  - pramipexole (MIRAPEX) 1 MG tablet; Take 1 tablet (1 mg total) by mouth daily.  Dispense: 90 tablet; Refill: 1  [name], NP

## 2020-06-23 DIAGNOSIS — S93402A Sprain of unspecified ligament of left ankle, initial encounter: Secondary | ICD-10-CM | POA: Diagnosis not present

## 2020-06-23 DIAGNOSIS — M25572 Pain in left ankle and joints of left foot: Secondary | ICD-10-CM | POA: Diagnosis not present

## 2020-06-26 ENCOUNTER — Other Ambulatory Visit: Payer: Self-pay | Admitting: Adult Health

## 2020-07-11 ENCOUNTER — Other Ambulatory Visit: Payer: Self-pay | Admitting: Adult Health

## 2020-07-11 DIAGNOSIS — Z760 Encounter for issue of repeat prescription: Secondary | ICD-10-CM

## 2020-07-11 DIAGNOSIS — E118 Type 2 diabetes mellitus with unspecified complications: Secondary | ICD-10-CM

## 2020-07-14 DIAGNOSIS — H66011 Acute suppurative otitis media with spontaneous rupture of ear drum, right ear: Secondary | ICD-10-CM | POA: Diagnosis not present

## 2020-07-22 ENCOUNTER — Encounter: Payer: Self-pay | Admitting: Adult Health

## 2020-07-25 DIAGNOSIS — Z23 Encounter for immunization: Secondary | ICD-10-CM | POA: Diagnosis not present

## 2020-10-04 ENCOUNTER — Other Ambulatory Visit: Payer: Self-pay | Admitting: Adult Health

## 2020-10-04 DIAGNOSIS — E118 Type 2 diabetes mellitus with unspecified complications: Secondary | ICD-10-CM

## 2020-10-04 DIAGNOSIS — Z760 Encounter for issue of repeat prescription: Secondary | ICD-10-CM

## 2020-10-18 ENCOUNTER — Telehealth: Payer: Self-pay | Admitting: *Deleted

## 2020-10-18 NOTE — Chronic Care Management (AMB) (Signed)
  Chronic Care Management   Note  10/18/2020 Name: Jennifermarie Franzen MRN: [id number] DOB: [birth year]  Icelynn Onken is a 70 y.o. year old female who is a primary care patient of [name], NP. I reached out to Rudy Jew D'Annunzio by phone today in response to a referral sent by Ms. Rudy Jew D'Annunzio's PCP, [name], NP.      [name] was given information about Chronic Care Management services today including:  CCM service includes personalized support from designated clinical staff supervised by her physician, including individualized plan of care and coordination with other care providers 24/7 contact phone numbers for assistance for urgent and routine care needs. Service will only be billed when office clinical staff spend 20 minutes or more in a month to coordinate care. Only one practitioner may furnish and bill the service in a calendar month. The patient may stop CCM services at any time (effective at the end of the month) by phone call to the office staff. The patient will be responsible for cost sharing (co-pay) of up to 20% of the service fee (after annual deductible is met).  Patient agreed to services and verbal consent obtained.   Follow up plan: Telephone appointment with care management team member scheduled for:10/31/2020  Shanyce Daris  Care Guide, Embedded Care Coordination [place]  Care Management  Direct Dial: [phone]

## 2020-10-20 ENCOUNTER — Other Ambulatory Visit: Payer: Self-pay | Admitting: Adult Health

## 2020-10-20 DIAGNOSIS — G2581 Restless legs syndrome: Secondary | ICD-10-CM

## 2020-10-21 ENCOUNTER — Other Ambulatory Visit: Payer: Self-pay | Admitting: Adult Health

## 2020-10-21 DIAGNOSIS — G2581 Restless legs syndrome: Secondary | ICD-10-CM

## 2020-10-31 ENCOUNTER — Ambulatory Visit (INDEPENDENT_AMBULATORY_CARE_PROVIDER_SITE_OTHER): Payer: Medicare Other

## 2020-10-31 DIAGNOSIS — E782 Mixed hyperlipidemia: Secondary | ICD-10-CM | POA: Diagnosis not present

## 2020-10-31 DIAGNOSIS — E118 Type 2 diabetes mellitus with unspecified complications: Secondary | ICD-10-CM

## 2020-10-31 DIAGNOSIS — I10 Essential (primary) hypertension: Secondary | ICD-10-CM

## 2020-10-31 NOTE — Patient Instructions (Signed)
Visit Information   PATIENT GOALS:   Goals Addressed             This Visit's Progress    RNCM Manage My Cholesterol       Timeframe:  Long-Range Goal Priority:  Medium Start Date:      10/31/20                       Expected End Date:   03/16/21                    Follow Up Date 12/05/20    - change to whole grain breads, cereal, pasta - eat smaller or less servings of red meat - fill half the plate with nonstarchy vegetables - get blood test (fasting) done 1 week before next visit - increase the amount of fiber in food - read food labels for fat and fiber - switch to low-fat or skim milk    Why is this important?   Changing cholesterol starts with eating heart-healthy foods.  Other steps may be to increase your activity and to quit if you smoke.    Notes:      RNCM:Monitor and Manage My Blood Sugar-Diabetes Type 2   On track    Timeframe:  Long-Range Goal Priority:  High Start Date:      10/31/20                       Expected End Date:       03/16/21                Follow Up Date 12/05/20    - check blood sugar at prescribed times - check blood sugar if I feel it is too high or too low - take the blood sugar log to all doctor visits - take the blood sugar meter to all doctor visits    Why is this important?   Checking your blood sugar at home helps to keep it from getting very high or very low.  Writing the results in a diary or log helps the doctor know how to care for you.  Your blood sugar log should have the time, date and the results.  Also, write down the amount of insulin or other medicine that you take.  Other information, like what you ate, exercise done and how you were feeling, will also be helpful.     Notes:      RNCM:Track and Manage My Blood Pressure-Hypertension   On track    Timeframe:  Long-Range Goal Priority:  Medium Start Date:    10/31/20                         Expected End Date:    03/16/21                   Follow Up Date 12/05/20    -  check blood pressure weekly - choose a place to take my blood pressure (home, clinic or office, retail store) - write blood pressure results in a log or diary    Why is this important?   You won't feel high blood pressure, but it can still hurt your blood vessels.  High blood pressure can cause heart or kidney problems. It can also cause a stroke.  Making lifestyle changes like losing a little weight or eating less salt will help.  Checking your  blood pressure at home and at different times of the day can help to control blood pressure.  If the doctor prescribes medicine remember to take it the way the doctor ordered.  Call the office if you cannot afford the medicine or if there are questions about it.     Notes:       Type 2 Diabetes Mellitus, Self-Care, Adult When you have type 2 diabetes (type 2 diabetes mellitus), you must make sure your blood sugar (glucose) stays in a healthy range. You can do this with: Nutrition. Exercise. Lifestyle changes. Medicines or insulin, if needed. Support from your doctors and others. What are the risks? Having diabetes can raise your risk for other long-term (chronic) health problems. You may get medicines to help prevent these problems. How to stay aware of blood sugar  Check your blood sugar level every day, as often as told. Have your A1C (hemoglobin A1C) level checked two or more times a year. Have it checked more often if told. Your doctor will set personal treatment goals for you. In general, you should have these blood sugar levels: Before meals: 80-130 mg/dL (4.4-7.2 mmol/L). After meals: below 180 mg/dL (10 mmol/L). A1C: less than 7%. How to manage high and low blood sugar Symptoms of high blood sugar High blood sugar is also called hyperglycemia. Know the symptoms of high blood sugar. These may include: More thirst. Hunger. Feeling very tired. Needing to pee (urinate) more often than normal. Seeing things blurry. Symptoms of low  blood sugar Low blood sugar is also called hypoglycemia. This is when blood sugar is at or below 70 mg/dL (3.9 mmol/L). Symptoms may include: Hunger. Feeling worried or nervous (anxious). Feeling sweaty and cold to the touch (clammy). Being dizzy or light-headed. Feeling sleepy. A fast heartbeat. Feeling grouchy (irritable). Tingling or loss of feeling (numbness) around your mouth, lips, or tongue. Restless sleep. Diabetes medicines can cause low blood sugar. You are more at risk: While you exercise. After exercise. During sleep. When you are sick. When you skip meals or do not eat for a long time. Treating low blood sugar If you think you have low blood sugar, eat or drink something sugary right away. Keep 15 grams of a fast-acting carb (carbohydrate) with you all the time. Make sure your family and friends know how to treatyou if you cannot treat yourself. Treating very low blood sugar Severe hypoglycemia is when your blood sugar is at or below 54 mg/dL (3 mmol/L). Severe hypoglycemia is an emergency. Do not wait to see if the symptoms will go away. Get medical help right away. Call your local emergency services (911 in the [place] Do not drive yourself to the hospital. You may need a glucagon shot if you have very low blood sugar and you cannot eat or drink. Have a family member or friend learn how to check your blood sugar and how to give you a glucagon shot. Ask your doctor if you should have akit for glucagon shots. Follow these instructions at home: Medicines Take diabetes medicines as told. If your doctor prescribed insulin or diabetes medicines, take them each day. Do not run out of insulin or other medicines. Plan ahead. If you use insulin, change the amount you take based on how active you are and what foods you eat. Your doctor will tell you how to do this. Take over-the-counter and prescription medicines only as told by your doctor. Eating and drinking  Eat healthy foods.  These include: Low-fat (lean)  proteins. Complex carbs, such as whole grains. Fresh fruits and vegetables. Low-fat dairy products. Healthy fats. Meet with a food expert (dietitian) to make an eating plan. Follow instructions from your doctor about what you cannot eat or drink. Drink enough fluid to keep your pee (urine) pale yellow. Keep track of carbs that you eat. Read food labels and learn serving sizes of foods. Follow your sick-day plan when you cannot eat or drink as normal. Make this plan with your doctor so it is ready to use.  Activity Exercise as told by your doctor. You may need to: Do stretching and strength exercises 2 or more times a week. Do 150 minutes or more of exercise each week that makes your heart beat faster and makes you sweat. Spread out your exercise over 3 or more days a week. Do not go more than 2 days in a row without exercise. Talk with your doctor before you start a new exercise. Your doctor may tell you to change: How much insulin or medicines you take. How much food you eat. Lifestyle Do not use any products that contain nicotine or tobacco, such as cigarettes, e-cigarettes, and chewing tobacco. If you need help quitting, ask your doctor. If you drink alcohol and your doctor says alcohol is safe for you: Limit how much you use to: 0-1 drink a day for women who are not pregnant. 0-2 drinks a day for men. Be aware of how much alcohol is in your drink. In the [place] one drink equals one 12 oz bottle of beer (355 mL), one 5 oz glass of wine (148 mL), or one 1 oz glass of hard liquor (44 mL). Learn to deal with stress. If you need help, ask your doctor. Body care  Stay up to date with your shots (immunizations). Have your eyes and feet checked by a doctor as often as told. Check your skin and feet every day. Check for cuts, bruises, redness, blisters, or sores. Brush your teeth and gums two times a day. Floss one or more times a day. Go to the dentist one  or more times every 6 months. Stay at a healthy weight.  General instructions Share your diabetes care plan with: Your work or school. People you live with. Carry a card or wear jewelry that says you have diabetes. Keep all follow-up visits as told by your doctor. This is important. Questions to ask your doctor Do I need to meet with a certified expert in diabetes education and care? Where can I find a support group? Where to find more information American Diabetes Association: www.diabetes.org American Association of Diabetes Care and Education Specialists: www.diabeteseducator.org International Diabetes Federation: MemberVerification.ca Summary When you have type 2 diabetes, you must make sure your blood sugar (glucose) stays in a healthy range. You can do this with nutrition, exercise, medicines and insulin, and support from doctors and others. Check your blood sugar every day, or as often as told. Having diabetes can raise your risk for other long-term health problems. You may get medicines to help prevent these problems. Share your diabetes management plan with people at work, school, and home. Keep all follow-up visits as told by your doctor. This is important. This information is not intended to replace advice given to you by your health care provider. Make sure you discuss any questions you have with your healthcare provider. Document Revised: 03/16/2020 Document Reviewed: 11/04/2019 Elsevier Patient Education  [place].   Consent to CCM Services: [name] was given  information about Chronic Care Management services today including:  CCM service includes personalized support from designated clinical staff supervised by her physician, including individualized plan of care and coordination with other care providers 24/7 contact phone numbers for assistance for urgent and routine care needs. Service will only be billed when office clinical staff spend 20 minutes or more in a  month to coordinate care. Only one practitioner may furnish and bill the service in a calendar month. The patient may stop CCM services at any time (effective at the end of the month) by phone call to the office staff. The patient will be responsible for cost sharing (co-pay) of up to 20% of the service fee (after annual deductible is met).  Patient agreed to services and verbal consent obtained.   Patient verbalizes understanding of instructions provided today and agrees to view in [place].   Telephone follow up appointment with care management team member scheduled for:  Peter Garter RN, [facility], CDE Care Management Coordinator [facility] [phone], Mobile [phone]   CLINICAL CARE PLAN: Patient Care Plan: Diabetes Type 2 (Adult)     Problem Identified: Need for diabetes self management   Priority: High     Long-Range Goal: Effective self managment of Type 2 diabetes   Start Date: 10/31/2020  Expected End Date: 03/16/2021  This Visit's Progress: On track  Priority: High  Note:   Objective:  Lab Results  Component Value Date   HGBA1C 6.5 04/28/2020   Lab Results  Component Value Date   CREATININE 0.86 11/03/2019   CREATININE 0.84 10/29/2018   CREATININE 0.74 06/13/2017   No results found for: EGFR Current Barriers:  Knowledge Deficits related to basic Diabetes pathophysiology and self care/management Knowledge Deficits related to medications used for management of diabetes Unable to independently self manage Type 2 Diabetes States that she has not been checking her blood sugar regularly.  States she might check once a month and the last time she checked it was 120.  States she does try to watch the breads she eats.  States she does drink one Pepsi a day.  States she is caring for her husband who is undergoing prostate cancer treatments but she feel she is handling the stress OK at this time Case Manager Clinical Goal(s):  patient will  demonstrate improved adherence to prescribed treatment plan for diabetes self care/management as evidenced by: adherence to ADA/ carb modified diet adherence to prescribed medication regimen contacting provider for new or worsened symptoms or questions Interventions:  Collaboration with Carlisle Cater, Tommi Rumps, NP regarding development and update of comprehensive plan of care as evidenced by provider attestation and co-signature Inter-disciplinary care team collaboration (see longitudinal plan of care) Provided education to patient about basic DM disease process Reviewed medications with patient and discussed importance of medication adherence Discussed plans with patient for ongoing care management follow up and provided patient with direct contact information for care management team Provided patient with written educational materials related to hypo and hyperglycemia and importance of correct treatment Reviewed scheduled/upcoming provider appointments including: no scheduled primary care provider Advised patient, providing education and rationale, to check cbg weekly or if she is not feeling good and record, calling provider for findings outside established parameters.   Review of patient status, including review of consultants reports, relevant laboratory and other test results, and medications completed. Encouraged to try to cut back on sugar sweetened drinks and to start by cutting out sodas 2 days a week Discussed referral to CCM LCSW  for stress but declines at this time Self-Care Activities - Self administers oral medications as prescribed Attends all scheduled provider appointments Checks blood sugars as prescribed and utilize hyper and hypoglycemia protocol as needed Adheres to prescribed ADA/carb modified Patient Goals: - check blood sugar at prescribed times - check blood sugar if I feel it is too high or too low - take the blood sugar log to all doctor visits - take the blood sugar meter to  all doctor visits - change to whole grain breads, cereal, pasta - drink 6 to 8 glasses of water each day - fill half of plate with vegetables - limit fast food meals to no more than 1 per week - read food labels for fat, fiber, carbohydrates and portion size - switch to sugar-free drinks - schedule appointment with eye doctor - check feet daily for cuts, sores or redness - keep feet up while sitting - trim toenails straight across - wash and dry feet carefully every day - wear comfortable, cotton socks - wear comfortable, well-fitting shoes Follow Up Plan: Telephone follow up appointment with care management team member scheduled for: 12/05/20 at 11:30 AM The patient has been provided with contact information for the care management team and has been advised to call with any health related questions or concerns.      Patient Care Plan: Cardiovascular disease( HTN, HLD)     Problem Identified: Lack of self management plan for Cardiovascular disease(HTN, HLD)   Priority: Medium     Long-Range Goal: Effective self management of  Cardiovascular disease(HTN, HLD)   Start Date: 10/31/2020  Expected End Date: 03/16/2021  This Visit's Progress: On track  Priority: Medium  Note:   Objective:  Last practice recorded BP readings:  BP Readings from Last 3 Encounters:  04/28/20 128/74  11/03/19 136/84  09/30/19 138/78  Lipid Panel     Component Value Date/Time   CHOL 142 11/03/2019 0810   TRIG 193 (H) 11/03/2019 0810   HDL 49 (L) 11/03/2019 0810   CHOLHDL 2.9 11/03/2019 0810   VLDL 74.0 (H) 10/29/2018 1048   LDLCALC 67 11/03/2019 0810   LDLDIRECT 68.0 10/29/2018 1048    Most recent eGFR/CrCl: No results found for: EGFR  No components found for: CRCL Current Barriers:  Knowledge Deficits related to basic understanding of hypertension and HLD pathophysiology and self care management Knowledge Deficits related to understanding of medications prescribed for management of hypertension  and HLD Unable to independently self manage  Cardiovascular disease(HTN, HLD) with x of DM2, hypothyroidism and restless leg syndrome States she does not check her B/P regularly but she does check her husbands B/P daily.  States she does follow a low sodium diet.  States she has not been walking for exercise recently Case Manager Clinical Goal(s):  patient will verbalize understanding of plan for hypertension and HLD management patient will attend all scheduled medical appointments: no schedule primary care provider patient will demonstrate improved adherence to prescribed treatment plan for hypertension as evidenced by taking all medications as prescribed, monitoring and recording blood pressure as directed, adhering to low sodium/DASH diet patient will demonstrate improved health management independence as evidenced by checking blood pressure as directed and notifying PCP if SBP>160 or DBP > 90, taking all medications as prescribe, and adhering to a low sodium diet as discussed. Interventions:  Collaboration with [name], NP regarding development and update of comprehensive plan of care as evidenced by provider attestation and co-signature Inter-disciplinary care team collaboration (see longitudinal plan  of care) Evaluation of current treatment plan related to hypertension self management and patient's adherence to plan as established by provider. Provided education to patient re: stroke prevention, s/s of heart attack and stroke, DASH diet, complications of uncontrolled blood pressure Reviewed medications with patient and discussed importance of compliance Discussed plans with patient for ongoing care management follow up and provided patient with direct contact information for care management team Advised patient, providing education and rationale, to monitor blood pressure daily and record, calling PCP for findings outside established parameters.  Reviewed scheduled/upcoming provider  appointments including:  no primary care provider scheduled-instructed to call to schedule appointment Reviewed importance of stress management to help with HTN control-declines CCM LCSW referral for stress  Reviewed  to avoid saturated fats, trans-fats and eat more fiber Reviewed to take statin as ordered Reviewed to lower fatty foods, red meat, cheese, milk and increase fiber like whole grains and veggies.  Self-Care Activities: - Self administers medications as prescribed Attends all scheduled provider appointments Calls provider office for new concerns, questions, or BP outside discussed parameters Checks BP and records as discussed Follows a low sodium diet/DASH diet Patient Goals: - check blood pressure weekly - choose a place to take my blood pressure (home, clinic or office, retail store) - write blood pressure results in a log or diary - ask questions to understand - change to whole grain breads, cereal, pasta - eat smaller or less servings of red meat - fill half the plate with nonstarchy vegetables - get blood test (fasting) done 1 week before next visit - increase the amount of fiber in food - read food labels for fat and fiber - switch to low-fat or skim milk Follow Up Plan: Telephone follow up appointment with care management team member scheduled for: 12/05/20 at 11:30 AM The patient has been provided with contact information for the care management team and has been advised to call with any health related questions or concerns.

## 2020-10-31 NOTE — Chronic Care Management (AMB) (Signed)
Chronic Care Management   CCM RN Visit Note  10/31/2020 Name: [name] MRN: [id number] DOB: January 05, 1951  Subjective: [name] is a 70 y.o. year old female who is a primary care patient of [name], NP. The care management team was consulted for assistance with disease management and care coordination needs.    Engaged with patient by telephone for initial visit in response to provider referral for case management and/or care coordination services.   Consent to Services:  The patient was given the following information about Chronic Care Management services today, agreed to services, and gave verbal consent: 1. CCM service includes personalized support from designated clinical staff supervised by the primary care provider, including individualized plan of care and coordination with other care providers 2. 24/7 contact phone numbers for assistance for urgent and routine care needs. 3. Service will only be billed when office clinical staff spend 20 minutes or more in a month to coordinate care. 4. Only one practitioner may furnish and bill the service in a calendar month. 5.The patient may stop CCM services at any time (effective at the end of the month) by phone call to the office staff. 6. The patient will be responsible for cost sharing (co-pay) of up to 20% of the service fee (after annual deductible is met). Patient agreed to services and consent obtained.  Patient agreed to services and verbal consent obtained.   Assessment: Review of patient past medical history, allergies, medications, health status, including review of consultants reports, laboratory and other test data, was performed as part of comprehensive evaluation and provision of chronic care management services.   SDOH (Social Determinants of Health) assessments and interventions performed:  SDOH Interventions    Flowsheet Row Most Recent Value  SDOH Interventions   Food Insecurity  Interventions Intervention Not Indicated  Financial Strain Interventions Intervention Not Indicated  Housing Interventions Intervention Not Indicated  Stress Interventions Patient Refused  [declines counseling/LCSW]  Transportation Interventions Intervention Not Indicated        CCM Care Plan  Allergies  Allergen Reactions   Benadryl [Diphenhydramine Hcl] Anaphylaxis   Ace Inhibitors Cough   Amoxicillin    Aspirin    Chlorphen-Phenyleph-Methscop     REACTION: rash   Codeine    Diphenhydramine Hcl     REACTION: swelling-mouth   Fish Allergy    Hydrocodone    Indomethacin    Pentazocine Lactate     REACTION: passing out   Promethazine Hcl    Quinine     REACTION: itching redface    Outpatient Encounter Medications as of 10/31/2020  Medication Sig   acetaminophen (TYLENOL) 500 MG tablet Take 1,000 mg by mouth every 8 (eight) hours as needed.   aspirin 81 MG tablet Take 81 mg by mouth at bedtime.   atorvastatin (LIPITOR) 10 MG tablet Take 1 tablet (10 mg total) by mouth daily.   cholecalciferol (VITAMIN D3) 25 MCG (1000 UT) tablet Take 1,000 Units by mouth daily.   citalopram (CELEXA) 20 MG tablet TAKE 1 TABLET(20 MG) BY MOUTH DAILY   Cyanocobalamin (VITAMIN B-12 PO) Take 1 tablet by mouth daily.   glucose blood (ONETOUCH VERIO) test strip Test twice daily.   levothyroxine (SYNTHROID) 88 MCG tablet Take 1 tablet (88 mcg total) by mouth daily.   losartan-hydrochlorothiazide (HYZAAR) 100-25 MG tablet TAKE 1 TABLET BY MOUTH DAILY   metFORMIN (GLUCOPHAGE) 500 MG tablet TAKE 1 TABLET(500 MG) BY MOUTH TWICE DAILY WITH A MEAL   pramipexole (MIRAPEX) 1  MG tablet TAKE 1 TABLET(1 MG) BY MOUTH DAILY   Prenatal Multivit-Min-Fe-FA (PRE-NATAL PO)    No facility-administered encounter medications on file as of 10/31/2020.    Patient Active Problem List   Diagnosis Date Noted   Fracture of second toe, left, closed, initial encounter 09/26/2017   GERD (gastroesophageal reflux disease)  10/19/2014   Essential hypertension 08/16/2014   Breast pain, right 05/20/2014   Restless leg syndrome 05/20/2014   Hip pain, bilateral 10/23/2011   Knee pain, bilateral 10/23/2011   EDEMA 01/15/2009   Hypothyroidism 12/11/2006   Diabetes type 2, uncontrolled ([place]) 12/11/2006   DIVERTICULOSIS, COLON 12/11/2006   HEADACHE 12/11/2006    Conditions to be addressed/monitored:HTN, HLD, and DMII  Care Plan : Diabetes Type 2 (Adult)  Updates made by Dimitri Ped, RN since 10/31/2020 12:00 AM     Problem: Need for diabetes self management   Priority: High     Long-Range Goal: Effective self managment of Type 2 diabetes   Start Date: 10/31/2020  Expected End Date: 03/16/2021  This Visit's Progress: On track  Priority: High  Note:   Objective:  Lab Results  Component Value Date   HGBA1C 6.5 04/28/2020   Lab Results  Component Value Date   CREATININE 0.86 11/03/2019   CREATININE 0.84 10/29/2018   CREATININE 0.74 06/13/2017   No results found for: EGFR Current Barriers:  Knowledge Deficits related to basic Diabetes pathophysiology and self care/management Knowledge Deficits related to medications used for management of diabetes Unable to independently self manage Type 2 Diabetes States that she has not been checking her blood sugar regularly.  States she might check once a month and the last time she checked it was 120.  States she does try to watch the breads she eats.  States she does drink one Pepsi a day.  States she is caring for her husband who is undergoing prostate cancer treatments but she feel she is handling the stress OK at this time Case Manager Clinical Goal(s):  patient will demonstrate improved adherence to prescribed treatment plan for diabetes self care/management as evidenced by: adherence to ADA/ carb modified diet adherence to prescribed medication regimen contacting provider for new or worsened symptoms or questions Interventions:  Collaboration with  Carlisle Cater, Tommi Rumps, NP regarding development and update of comprehensive plan of care as evidenced by provider attestation and co-signature Inter-disciplinary care team collaboration (see longitudinal plan of care) Provided education to patient about basic DM disease process Reviewed medications with patient and discussed importance of medication adherence Discussed plans with patient for ongoing care management follow up and provided patient with direct contact information for care management team Provided patient with written educational materials related to hypo and hyperglycemia and importance of correct treatment Reviewed scheduled/upcoming provider appointments including: no scheduled primary care provider Advised patient, providing education and rationale, to check cbg weekly or if she is not feeling good and record, calling provider for findings outside established parameters.   Review of patient status, including review of consultants reports, relevant laboratory and other test results, and medications completed. Encouraged to try to cut back on sugar sweetened drinks and to start by cutting out sodas 2 days a week Discussed referral to CCM LCSW for stress but declines at this time Self-Care Activities - Self administers oral medications as prescribed Attends all scheduled provider appointments Checks blood sugars as prescribed and utilize hyper and hypoglycemia protocol as needed Adheres to prescribed ADA/carb modified Patient Goals: - check blood sugar at prescribed times -  check blood sugar if I feel it is too high or too low - take the blood sugar log to all doctor visits - take the blood sugar meter to all doctor visits - change to whole grain breads, cereal, pasta - drink 6 to 8 glasses of water each day - fill half of plate with vegetables - limit fast food meals to no more than 1 per week - read food labels for fat, fiber, carbohydrates and portion size - switch to sugar-free  drinks - schedule appointment with eye doctor - check feet daily for cuts, sores or redness - keep feet up while sitting - trim toenails straight across - wash and dry feet carefully every day - wear comfortable, cotton socks - wear comfortable, well-fitting shoes Follow Up Plan: Telephone follow up appointment with care management team member scheduled for: 12/05/20 at 11:30 AM The patient has been provided with contact information for the care management team and has been advised to call with any health related questions or concerns.      Care Plan : Cardiovascular disease( HTN, HLD)  Updates made by Dimitri Ped, RN since 10/31/2020 12:00 AM     Problem: Lack of self management plan for Cardiovascular disease(HTN, HLD)   Priority: Medium     Long-Range Goal: Effective self management of  Cardiovascular disease(HTN, HLD)   Start Date: 10/31/2020  Expected End Date: 03/16/2021  This Visit's Progress: On track  Priority: Medium  Note:   Objective:  Last practice recorded BP readings:  BP Readings from Last 3 Encounters:  04/28/20 128/74  11/03/19 136/84  09/30/19 138/78  Lipid Panel     Component Value Date/Time   CHOL 142 11/03/2019 0810   TRIG 193 (H) 11/03/2019 0810   HDL 49 (L) 11/03/2019 0810   CHOLHDL 2.9 11/03/2019 0810   VLDL 74.0 (H) 10/29/2018 1048   LDLCALC 67 11/03/2019 0810   LDLDIRECT 68.0 10/29/2018 1048    Most recent eGFR/CrCl: No results found for: EGFR  No components found for: CRCL Current Barriers:  Knowledge Deficits related to basic understanding of hypertension and HLD pathophysiology and self care management Knowledge Deficits related to understanding of medications prescribed for management of hypertension and HLD Unable to independently self manage  Cardiovascular disease(HTN, HLD) with x of DM2, hypothyroidism and restless leg syndrome States she does not check her B/P regularly but she does check her husbands B/P daily.  States she does  follow a low sodium diet.  States she has not been walking for exercise recently Case Manager Clinical Goal(s):  patient will verbalize understanding of plan for hypertension and HLD management patient will attend all scheduled medical appointments: no schedule primary care provider patient will demonstrate improved adherence to prescribed treatment plan for hypertension as evidenced by taking all medications as prescribed, monitoring and recording blood pressure as directed, adhering to low sodium/DASH diet patient will demonstrate improved health management independence as evidenced by checking blood pressure as directed and notifying PCP if SBP>160 or DBP > 90, taking all medications as prescribe, and adhering to a low sodium diet as discussed. Interventions:  Collaboration with [name], NP regarding development and update of comprehensive plan of care as evidenced by provider attestation and co-signature Inter-disciplinary care team collaboration (see longitudinal plan of care) Evaluation of current treatment plan related to hypertension self management and patient's adherence to plan as established by provider. Provided education to patient re: stroke prevention, s/s of heart attack and stroke, DASH diet, complications of  uncontrolled blood pressure Reviewed medications with patient and discussed importance of compliance Discussed plans with patient for ongoing care management follow up and provided patient with direct contact information for care management team Advised patient, providing education and rationale, to monitor blood pressure daily and record, calling PCP for findings outside established parameters.  Reviewed scheduled/upcoming provider appointments including:  no primary care provider scheduled-instructed to call to schedule appointment Reviewed importance of stress management to help with HTN control-declines CCM LCSW referral for stress  Reviewed  to avoid saturated fats,  trans-fats and eat more fiber Reviewed to take statin as ordered Reviewed to lower fatty foods, red meat, cheese, milk and increase fiber like whole grains and veggies.  Self-Care Activities: - Self administers medications as prescribed Attends all scheduled provider appointments Calls provider office for new concerns, questions, or BP outside discussed parameters Checks BP and records as discussed Follows a low sodium diet/DASH diet Patient Goals: - check blood pressure weekly - choose a place to take my blood pressure (home, clinic or office, retail store) - write blood pressure results in a log or diary - ask questions to understand - change to whole grain breads, cereal, pasta - eat smaller or less servings of red meat - fill half the plate with nonstarchy vegetables - get blood test (fasting) done 1 week before next visit - increase the amount of fiber in food - read food labels for fat and fiber - switch to low-fat or skim milk Follow Up Plan: Telephone follow up appointment with care management team member scheduled for: 12/05/20 at 11:30 AM The patient has been provided with contact information for the care management team and has been advised to call with any health related questions or concerns.       Plan:Telephone follow up appointment with care management team member scheduled for:  12/05/20 and The patient has been provided with contact information for the care management team and has been advised to call with any health related questions or concerns.  Peter Garter RN, Jackquline Denmark, CDE Care Management Coordinator [facility] [phone], Mobile [phone]

## 2020-11-03 ENCOUNTER — Encounter: Payer: Self-pay | Admitting: Adult Health

## 2020-11-04 MED ORDER — GLUCOSE BLOOD VI STRP: ORAL_STRIP | 1 refills | Status: DC

## 2020-11-07 ENCOUNTER — Other Ambulatory Visit: Payer: Self-pay | Admitting: Adult Health

## 2020-11-07 DIAGNOSIS — Z760 Encounter for issue of repeat prescription: Secondary | ICD-10-CM

## 2020-11-09 ENCOUNTER — Other Ambulatory Visit: Payer: Self-pay

## 2020-11-09 DIAGNOSIS — E118 Type 2 diabetes mellitus with unspecified complications: Secondary | ICD-10-CM

## 2020-11-09 MED ORDER — BLOOD GLUCOSE MONITOR KIT: PACK | 0 refills | Status: DC

## 2020-11-09 NOTE — Telephone Encounter (Signed)
Incoming fax from [employer] stating that True Metrix blood glucose test strips are no longer covered. Will send in a medication refill for new meter that's covered by pt insurance.

## 2020-11-16 ENCOUNTER — Other Ambulatory Visit: Payer: Self-pay | Admitting: Adult Health

## 2020-11-16 DIAGNOSIS — G2581 Restless legs syndrome: Secondary | ICD-10-CM

## 2020-11-30 ENCOUNTER — Telehealth: Payer: Self-pay | Admitting: Adult Health

## 2020-11-30 NOTE — Telephone Encounter (Signed)
Left message for patient to call back and schedule Medicare Annual Wellness Visit (AWV) either virtually or in office.  Left both  my jabber number [phone] and office number    Last AWVI 01/01/20  please schedule at anytime with [facility] Nurse Health Advisor 1 or 2   This should be a 45 minute visit.

## 2020-12-05 ENCOUNTER — Ambulatory Visit (INDEPENDENT_AMBULATORY_CARE_PROVIDER_SITE_OTHER): Payer: Medicare Other

## 2020-12-05 ENCOUNTER — Other Ambulatory Visit: Payer: Self-pay

## 2020-12-05 DIAGNOSIS — I10 Essential (primary) hypertension: Secondary | ICD-10-CM | POA: Diagnosis not present

## 2020-12-05 DIAGNOSIS — R609 Edema, unspecified: Secondary | ICD-10-CM

## 2020-12-05 DIAGNOSIS — E1165 Type 2 diabetes mellitus with hyperglycemia: Secondary | ICD-10-CM | POA: Diagnosis not present

## 2020-12-05 DIAGNOSIS — E782 Mixed hyperlipidemia: Secondary | ICD-10-CM

## 2020-12-05 MED ORDER — GLUCOSE BLOOD VI STRP: ORAL_STRIP | 0 refills | Status: AC

## 2020-12-05 NOTE — Chronic Care Management (AMB) (Signed)
Chronic Care Management   CCM RN Visit Note  12/05/2020 Name: [name] MRN: [id number] DOB: 08/05/1950  Subjective: [name] is a 70 y.o. year old female who is a primary care patient of [name], NP. The care management team was consulted for assistance with disease management and care coordination needs.    Engaged with patient by telephone for follow up visit in response to provider referral for case management and/or care coordination services.   Consent to Services:  The patient was given information about Chronic Care Management services, agreed to services, and gave verbal consent prior to initiation of services.  Please see initial visit note for detailed documentation.   Patient agreed to services and verbal consent obtained.   Assessment: Review of patient past medical history, allergies, medications, health status, including review of consultants reports, laboratory and other test data, was performed as part of comprehensive evaluation and provision of chronic care management services.   SDOH (Social Determinants of Health) assessments and interventions performed:    CCM Care Plan  Allergies  Allergen Reactions   Benadryl [Diphenhydramine Hcl] Anaphylaxis   Ace Inhibitors Cough   Amoxicillin    Aspirin    Chlorphen-Phenyleph-Methscop     REACTION: rash   Codeine    Diphenhydramine Hcl     REACTION: swelling-mouth   Fish Allergy    Hydrocodone    Indomethacin    Pentazocine Lactate     REACTION: passing out   Promethazine Hcl    Quinine     REACTION: itching redface    Outpatient Encounter Medications as of 12/05/2020  Medication Sig   acetaminophen (TYLENOL) 500 MG tablet Take 1,000 mg by mouth every 8 (eight) hours as needed.   aspirin 81 MG tablet Take 81 mg by mouth at bedtime.   atorvastatin (LIPITOR) 10 MG tablet Take 1 tablet (10 mg total) by mouth daily.   blood glucose meter kit and supplies KIT Dispense based  on patient and insurance preference. Use up to four times daily as directed.   cholecalciferol (VITAMIN D3) 25 MCG (1000 UT) tablet Take 1,000 Units by mouth daily.   citalopram (CELEXA) 20 MG tablet TAKE 1 TABLET(20 MG) BY MOUTH DAILY   Cyanocobalamin (VITAMIN B-12 PO) Take 1 tablet by mouth daily.   levothyroxine (SYNTHROID) 88 MCG tablet TAKE 1 TABLET(88 MCG) BY MOUTH DAILY   losartan-hydrochlorothiazide (HYZAAR) 100-25 MG tablet TAKE 1 TABLET BY MOUTH DAILY   metFORMIN (GLUCOPHAGE) 500 MG tablet TAKE 1 TABLET(500 MG) BY MOUTH TWICE DAILY WITH A MEAL   pramipexole (MIRAPEX) 1 MG tablet TAKE 1 TABLET(1 MG) BY MOUTH DAILY   Prenatal Multivit-Min-Fe-FA (PRE-NATAL PO)    No facility-administered encounter medications on file as of 12/05/2020.    Patient Active Problem List   Diagnosis Date Noted   Fracture of second toe, left, closed, initial encounter 09/26/2017   GERD (gastroesophageal reflux disease) 10/19/2014   Essential hypertension 08/16/2014   Breast pain, right 05/20/2014   Restless leg syndrome 05/20/2014   Hip pain, bilateral 10/23/2011   Knee pain, bilateral 10/23/2011   EDEMA 01/15/2009   Hypothyroidism 12/11/2006   Diabetes type 2, uncontrolled ([place]) 12/11/2006   DIVERTICULOSIS, COLON 12/11/2006   HEADACHE 12/11/2006    Conditions to be addressed/monitored:HTN, HLD, DMII, and edema  Care Plan : Diabetes Type 2 (Adult)  Updates made by Dimitri Ped, RN since 12/05/2020 12:00 AM     Problem: Need for diabetes self management   Priority: High  Long-Range Goal: Effective self managment of Type 2 diabetes   Start Date: 10/31/2020  Expected End Date: 03/16/2021  This Visit's Progress: On track  Recent Progress: On track  Priority: High  Note:   Objective:  Lab Results  Component Value Date   HGBA1C 6.5 04/28/2020   Lab Results  Component Value Date   CREATININE 0.86 11/03/2019   CREATININE 0.84 10/29/2018   CREATININE 0.74 06/13/2017   No results  found for: EGFR Current Barriers:  Knowledge Deficits related to basic Diabetes pathophysiology and self care/management Knowledge Deficits related to medications used for management of diabetes Unable to independently self manage Type 2 Diabetes States that she has not been checking her CBG yet.  States that her strips were out of date so she had to get new strips.  States she now has the new strips but she has not checked her CBG yet.  States she has been trying to eat healthier except for the 4 days she was at the beach.  States she did do some walking when she was at [employer].  States the beach trip was good for her stress and mood.  States she is going to a class at her church once a week now which is also enjoyable to her Case Manager Clinical Goal(s):  patient will demonstrate improved adherence to prescribed treatment plan for diabetes self care/management as evidenced by: adherence to ADA/ carb modified diet adherence to prescribed medication regimen contacting provider for new or worsened symptoms or questions Interventions:  Collaboration with Carlisle Cater, Tommi Rumps, NP regarding development and update of comprehensive plan of care as evidenced by provider attestation and co-signature Inter-disciplinary care team collaboration (see longitudinal plan of care) Provided education to patient about basic DM disease process Reviewed medications with patient and discussed importance of medication adherence Discussed plans with patient for ongoing care management follow up and provided patient with direct contact information for care management team Provided patient with written educational materials related to hypo and hyperglycemia and importance of correct treatment Reviewed scheduled/upcoming provider appointments including: no scheduled primary care provider Advised patient, providing education and rationale, to check cbg weekly or if she is not feeling good and record, calling provider for findings  outside established parameters.   Review of patient status, including review of consultants reports, relevant laboratory and other test results, and medications completed. Reinforced to try to cut back on sugar sweetened drinks and to start by cutting out sodas 2 days a week Reviewed to try to check CBG more frequently especially if she eats differently  Self-Care Activities - Self administers oral medications as prescribed Attends all scheduled provider appointments Checks blood sugars as prescribed and utilize hyper and hypoglycemia protocol as needed Adheres to prescribed ADA/carb modified Patient Goals: - check blood sugar at prescribed times - check blood sugar if I feel it is too high or too low - take the blood sugar log to all doctor visits - take the blood sugar meter to all doctor visits - change to whole grain breads, cereal, pasta - drink 6 to 8 glasses of water each day - fill half of plate with vegetables - limit fast food meals to no more than 1 per week - read food labels for fat, fiber, carbohydrates and portion size - switch to sugar-free drinks - schedule appointment with eye doctor - check feet daily for cuts, sores or redness - keep feet up while sitting - trim toenails straight across - wash and dry feet carefully  every day - wear comfortable, cotton socks - wear comfortable, well-fitting shoes Follow Up Plan: Telephone follow up appointment with care management team member scheduled for: 02/07/21 at 11:30 AM The patient has been provided with contact information for the care management team and has been advised to call with any health related questions or concerns.      Care Plan : Cardiovascular disease( HTN, HLD)  Updates made by Dimitri Ped, RN since 12/05/2020 12:00 AM     Problem: Lack of self management plan for Cardiovascular disease(HTN, HLD)   Priority: Medium     Long-Range Goal: Effective self management of  Cardiovascular disease(HTN,  HLD)   Start Date: 10/31/2020  Expected End Date: 03/16/2021  This Visit's Progress: On track  Recent Progress: On track  Priority: Medium  Note:   Objective:  Last practice recorded BP readings:  BP Readings from Last 3 Encounters:  04/28/20 128/74  11/03/19 136/84  09/30/19 138/78  Lipid Panel     Component Value Date/Time   CHOL 142 11/03/2019 0810   TRIG 193 (H) 11/03/2019 0810   HDL 49 (L) 11/03/2019 0810   CHOLHDL 2.9 11/03/2019 0810   VLDL 74.0 (H) 10/29/2018 1048   LDLCALC 67 11/03/2019 0810   LDLDIRECT 68.0 10/29/2018 1048    Most recent eGFR/CrCl: No results found for: EGFR  No components found for: CRCL Current Barriers:  Knowledge Deficits related to basic understanding of hypertension and HLD pathophysiology and self care management Knowledge Deficits related to understanding of medications prescribed for management of hypertension and HLD Unable to independently self manage  Cardiovascular disease(HTN, HLD) with x of DM2, hypothyroidism and restless leg syndrome States she has been not check her B/P as the batteries died in her monitor and she has not replaced them yet.  States she has the batteries but has not changed them.  States she did have some swelling in her legs when she was at the beach.  States she did have some salty fries.  States she still has some swelling in one leg.  Denies any redness or tenderness in leg.   States she does follow a low sodium diet except when traveling.   Case Manager Clinical Goal(s):  patient will verbalize understanding of plan for hypertension and HLD management patient will attend all scheduled medical appointments: no schedule primary care provider patient will demonstrate improved adherence to prescribed treatment plan for hypertension as evidenced by taking all medications as prescribed, monitoring and recording blood pressure as directed, adhering to low sodium/DASH diet patient will demonstrate improved health management  independence as evidenced by checking blood pressure as directed and notifying PCP if SBP>160 or DBP > 90, taking all medications as prescribe, and adhering to a low sodium diet as discussed. Interventions:  Collaboration with [name], NP regarding development and update of comprehensive plan of care as evidenced by provider attestation and co-signature Inter-disciplinary care team collaboration (see longitudinal plan of care) Evaluation of current treatment plan related to hypertension self management and patient's adherence to plan as established by provider. Reinforced stroke prevention, s/s of heart attack and stroke, DASH diet, complications of uncontrolled blood pressure Reviewed medications with patient and discussed importance of compliance Discussed plans with patient for ongoing care management follow up and provided patient with direct contact information for care management team Advised patient, providing education and rationale, to monitor blood pressure daily and record, calling PCP for findings outside established parameters.  Reviewed scheduled/upcoming provider appointments including:  no primary  care provider scheduled-instructed to call to schedule appointment Reviewed importance of stress management to help with HTN control-declines CCM LCSW referral for stress  Reinforced  to avoid saturated fats, trans-fats and eat more fiber Reviewed to take statin as ordered Reinforced to lower fatty foods, red meat, cheese, milk and increase fiber like whole grains and veggies.  Reviewed to call provider if she had redness, increased  or tenderness in her leg Reviewed to wear compression hose when traveling and to stop frequently to walk around Reviewed to try to avoid higher sodium foods when traveling Self-Care Activities: - Self administers medications as prescribed Attends all scheduled provider appointments Calls provider office for new concerns, questions, or BP outside  discussed parameters Checks BP and records as discussed Follows a low sodium diet/DASH diet Patient Goals: - check blood pressure weekly - choose a place to take my blood pressure (home, clinic or office, retail store) - write blood pressure results in a log or diary - ask questions to understand - change to whole grain breads, cereal, pasta - eat smaller or less servings of red meat - fill half the plate with nonstarchy vegetables - get blood test (fasting) done 1 week before next visit - increase the amount of fiber in food - read food labels for fat and fiber - switch to low-fat or skim milk Follow Up Plan: Telephone follow up appointment with care management team member scheduled for: 02/07/21/22 at 11:30 AM The patient has been provided with contact information for the care management team and has been advised to call with any health related questions or concerns.       Plan:Telephone follow up appointment with care management team member scheduled for:  02/07/21 and The patient has been provided with contact information for the care management team and has been advised to call with any health related questions or concerns.  Peter Garter RN, Jackquline Denmark, CDE Care Management Coordinator [facility] [phone], Mobile [phone]

## 2020-12-05 NOTE — Patient Instructions (Signed)
Visit Information  PATIENT GOALS:  Goals Addressed             This Visit's Progress    RNCM Manage My Cholesterol   On track    Timeframe:  Long-Range Goal Priority:  Medium Start Date:      10/31/20                       Expected End Date:   03/16/21                    Follow Up Date 02/07/21    - change to whole grain breads, cereal, pasta - eat smaller or less servings of red meat - fill half the plate with nonstarchy vegetables - get blood test (fasting) done 1 week before next visit - increase the amount of fiber in food - read food labels for fat and fiber - switch to low-fat or skim milk    Why is this important?   Changing cholesterol starts with eating heart-healthy foods.  Other steps may be to increase your activity and to quit if you smoke.    Notes:      RNCM:Monitor and Manage My Blood Sugar-Diabetes Type 2   On track    Timeframe:  Long-Range Goal Priority:  High Start Date:      10/31/20                       Expected End Date:       03/16/21                Follow Up Date 02/07/21    - check blood sugar at prescribed times - check blood sugar if I feel it is too high or too low - take the blood sugar log to all doctor visits - take the blood sugar meter to all doctor visits    Why is this important?   Checking your blood sugar at home helps to keep it from getting very high or very low.  Writing the results in a diary or log helps the doctor know how to care for you.  Your blood sugar log should have the time, date and the results.  Also, write down the amount of insulin or other medicine that you take.  Other information, like what you ate, exercise done and how you were feeling, will also be helpful.     Notes:      RNCM:Track and Manage My Blood Pressure-Hypertension   On track    Timeframe:  Long-Range Goal Priority:  Medium Start Date:    10/31/20                         Expected End Date:    03/16/21                   Follow Up Date  02/07/21    - check blood pressure weekly - choose a place to take my blood pressure (home, clinic or office, retail store) - write blood pressure results in a log or diary    Why is this important?   You won't feel high blood pressure, but it can still hurt your blood vessels.  High blood pressure can cause heart or kidney problems. It can also cause a stroke.  Making lifestyle changes like losing a little weight or eating less salt will help.  Checking your blood  pressure at home and at different times of the day can help to control blood pressure.  If the doctor prescribes medicine remember to take it the way the doctor ordered.  Call the office if you cannot afford the medicine or if there are questions about it.     Notes:       Edema  Edema is when you have too much fluid in your body or under your skin. Edema may make your legs, feet, and ankles swell up. Swelling is also common in looser tissues, like around your eyes. This is a common condition. It gets more common as you get older. There are many possible causes of edema. Eating too much salt (sodium) and being on your feet or sitting for a long time can cause edema in yourlegs, feet, and ankles. Hot weather may make edema worse. Edema is usually painless. Your skin may look swollen or shiny. Follow these instructions at home: Keep the swollen body part raised (elevated) above the level of your heart when you are sitting or lying down. Do not sit still or stand for a long time. Do not wear tight clothes. Do not wear garters on your upper legs. Exercise your legs. This can help the swelling go down. Wear elastic bandages or support stockings as told by your doctor. Eat a low-salt (low-sodium) diet to reduce fluid as told by your doctor. Depending on the cause of your swelling, you may need to limit how much fluid you drink (fluid restriction). Take over-the-counter and prescription medicines only as told by your  doctor. Contact a doctor if: Treatment is not working. You have heart, liver, or kidney disease and have symptoms of edema. You have sudden and unexplained weight gain. Get help right away if: You have shortness of breath or chest pain. You cannot breathe when you lie down. You have pain, redness, or warmth in the swollen areas. You have heart, liver, or kidney disease and get edema all of a sudden. You have a fever and your symptoms get worse all of a sudden. Summary Edema is when you have too much fluid in your body or under your skin. Edema may make your legs, feet, and ankles swell up. Swelling is also common in looser tissues, like around your eyes. Raise (elevate) the swollen body part above the level of your heart when you are sitting or lying down. Follow your doctor's instructions about diet and how much fluid you can drink (fluid restriction). This information is not intended to replace advice given to you by your health care provider. Make sure you discuss any questions you have with your healthcare provider. Document Revised: 01/27/2020 Document Reviewed: 01/27/2020 Elsevier Patient Education  2022 [employer].   Patient verbalizes understanding of instructions provided today and agrees to view in [place].   Telephone follow up appointment with care management team member scheduled for: 02/07/21 at 11:30 AM Peter Garter RN, Jackquline Denmark, CDE Care Management Coordinator [facility] [phone], Mobile [phone]

## 2020-12-12 ENCOUNTER — Encounter: Payer: Self-pay | Admitting: Adult Health

## 2020-12-14 ENCOUNTER — Encounter: Payer: Self-pay | Admitting: Adult Health

## 2020-12-14 ENCOUNTER — Other Ambulatory Visit: Payer: Self-pay

## 2020-12-14 ENCOUNTER — Ambulatory Visit (INDEPENDENT_AMBULATORY_CARE_PROVIDER_SITE_OTHER): Payer: Medicare Other | Admitting: Adult Health

## 2020-12-14 VITALS — BP 148/70 | HR 71 | Temp 98.4°F | Ht 63.5 in | Wt 181.0 lb

## 2020-12-14 DIAGNOSIS — M79671 Pain in right foot: Secondary | ICD-10-CM | POA: Diagnosis not present

## 2020-12-14 NOTE — Progress Notes (Signed)
Subjective:    Patient ID: [name], female    DOB: 01-29-1951, 70 y.o.   MRN: [id number]  HPI 70 year old female who  has a past medical history of Allergy, Anemia, Anxiety, ANXIETY DEPRESSION (09/15/2007), Arthritis, ASTHMA (05/15/2009), Asthma, DIABETES MELLITUS, TYPE II (12/11/2006), DIVERTICULOSIS, COLON (12/11/2006), Edema (01/15/2009), Gout, Headache(784.0) (12/11/2006), Hyperlipidemia, HYPERTENSION NEC (08/14/2007), HYPOTHYROIDISM (12/11/2006), PELVIC PAIN, CHRONIC (08/14/2007), and Restless leg syndrome.  She presents to the office today for an acute issue of right second toe pain.  She reports 3 days ago she stubbed her toe on a metal chair leg.  She has had pain across the top of her toes, bruising, and concern for infection due to blister on her right second toe.  She is able to ambulate bear weight but painful to do so.  She has been buddy taping her toes together to provide support  Review of Systems See HPI   Past Medical History:  Diagnosis Date   Allergy    Anemia    Anxiety    ANXIETY DEPRESSION 09/15/2007   Arthritis    ASTHMA 05/15/2009   Asthma    DIABETES MELLITUS, TYPE II 12/11/2006   DIVERTICULOSIS, COLON 12/11/2006   Edema 01/15/2009   Gout    pt denies; toe was broken   Headache(784.0) 12/11/2006   Hyperlipidemia    HYPERTENSION NEC 08/14/2007   HYPOTHYROIDISM 12/11/2006   PELVIC PAIN, CHRONIC 08/14/2007   Restless leg syndrome     Social History   Socioeconomic History   Marital status: Married    Spouse name: Not on file   Number of children: Not on file   Years of education: Not on file   Highest education level: Not on file  Occupational History   Not on file  Tobacco Use   Smoking status: Never   Smokeless tobacco: Never  Substance and Sexual Activity   Alcohol use: Yes    Alcohol/week: 0.0 standard drinks    Comment: very rare   Drug use: No   Sexual activity: Not on file  Other Topics Concern   Not on file  Social History Narrative    Not on file   Social Determinants of Health   Financial Resource Strain: Low Risk    Difficulty of Paying Living Expenses: Not hard at all  Food Insecurity: No Food Insecurity   Worried About Programme researcher, broadcasting/film/video in the Last Year: Never true   Ran Out of Food in the Last Year: Never true  Transportation Needs: No Transportation Needs   Lack of Transportation (Medical): No   Lack of Transportation (Non-Medical): No  Physical Activity: Inactive   Days of Exercise per Week: 0 days   Minutes of Exercise per Session: 0 min  Stress: Stress Concern Present   Feeling of Stress : To some extent  Social Connections: Moderately Integrated   Frequency of Communication with Friends and Family: More than three times a week   Frequency of Social Gatherings with Friends and Family: More than three times a week   Attends Religious Services: More than 4 times per year   Active Member of [employer] or Organizations: No   Attends Banker Meetings: Never   Marital Status: Married  Catering manager Violence: Not At Risk   Fear of Current or Ex-Partner: No   Emotionally Abused: No   Physically Abused: No   Sexually Abused: No    Past Surgical History:  Procedure Laterality Date   ABDOMINAL HYSTERECTOMY  APPENDECTOMY     Bladder Suspension      Family History  Problem Relation Age of Onset   COPD Mother        smoker   Diabetes Mother    COPD Father        smoker   Diabetes Father    Diabetes Sister    Diabetes Mellitus II Sister    Diabetes Sister    Diabetes Sister    Diabetes Sister    Breast cancer Other 63   Lung cancer Nephew 60   Colon cancer Neg Hx    Esophageal cancer Neg Hx    Pancreatic cancer Neg Hx     Allergies  Allergen Reactions   Benadryl [Diphenhydramine Hcl] Anaphylaxis   Ace Inhibitors Cough   Amoxicillin    Aspirin    Chlorphen-Phenyleph-Methscop     REACTION: rash   Codeine    Diphenhydramine Hcl     REACTION: swelling-mouth   Fish  Allergy    Hydrocodone    Indomethacin    Pentazocine Lactate     REACTION: passing out   Promethazine Hcl    Quinine     REACTION: itching redface    Current Outpatient Medications on File Prior to Visit  Medication Sig Dispense Refill   acetaminophen (TYLENOL) 500 MG tablet Take 1,000 mg by mouth every 8 (eight) hours as needed.     aspirin 81 MG tablet Take 81 mg by mouth at bedtime.     atorvastatin (LIPITOR) 10 MG tablet Take 1 tablet (10 mg total) by mouth daily. 90 tablet 3   blood glucose meter kit and supplies KIT Dispense based on patient and insurance preference. Use up to four times daily as directed. 1 each 0   cholecalciferol (VITAMIN D3) 25 MCG (1000 UT) tablet Take 1,000 Units by mouth daily.     citalopram (CELEXA) 20 MG tablet TAKE 1 TABLET(20 MG) BY MOUTH DAILY 90 tablet 1   Cyanocobalamin (VITAMIN B-12 PO) Take 1 tablet by mouth daily.     glucose blood test strip Use to check blood glucose TID 200 each 0   levothyroxine (SYNTHROID) 88 MCG tablet TAKE 1 TABLET(88 MCG) BY MOUTH DAILY 90 tablet 3   losartan-hydrochlorothiazide (HYZAAR) 100-25 MG tablet TAKE 1 TABLET BY MOUTH DAILY 90 tablet 1   metFORMIN (GLUCOPHAGE) 500 MG tablet TAKE 1 TABLET(500 MG) BY MOUTH TWICE DAILY WITH A MEAL 180 tablet 0   pramipexole (MIRAPEX) 1 MG tablet TAKE 1 TABLET(1 MG) BY MOUTH DAILY 30 tablet 0   Prenatal Multivit-Min-Fe-FA (PRE-NATAL PO)      No current facility-administered medications on file prior to visit.    BP (!) 148/70   Pulse 71   Temp 98.4 F (36.9 C) (Oral)   Ht 5' 3.5" (1.613 m)   Wt 181 lb (82.1 kg)   SpO2 95%   BMI 31.56 kg/m       Objective:   Physical Exam Vitals and nursing note reviewed.  Constitutional:      Appearance: Normal appearance.  Musculoskeletal:       Feet:  Feet:     Comments: Able to move all toes and has no loss of ROM  Skin:    General: Skin is warm and dry.     Findings: Bruising present.  Neurological:     General: No  focal deficit present.     Mental Status: She is alert and oriented to person, place, and time.  Psychiatric:  Mood and Affect: Mood normal.        Behavior: Behavior normal.        Thought Content: Thought content normal.        Judgment: Judgment normal.      Assessment & Plan:  1. Right foot pain -No concern for infection.  Advised to keep blister in place as long as possible.  Continue to buddy tape.  No signs of displaced fracture.  Advised against x-ray and she agreed.  Follow-up as needed  [name], NP

## 2020-12-15 ENCOUNTER — Other Ambulatory Visit: Payer: Self-pay | Admitting: Adult Health

## 2020-12-15 DIAGNOSIS — G2581 Restless legs syndrome: Secondary | ICD-10-CM

## 2020-12-16 ENCOUNTER — Other Ambulatory Visit: Payer: Self-pay | Admitting: Adult Health

## 2020-12-16 DIAGNOSIS — G2581 Restless legs syndrome: Secondary | ICD-10-CM

## 2020-12-28 ENCOUNTER — Other Ambulatory Visit: Payer: Self-pay | Admitting: Adult Health

## 2021-01-02 ENCOUNTER — Other Ambulatory Visit: Payer: Self-pay

## 2021-01-02 ENCOUNTER — Ambulatory Visit (INDEPENDENT_AMBULATORY_CARE_PROVIDER_SITE_OTHER): Payer: Medicare Other

## 2021-01-02 ENCOUNTER — Other Ambulatory Visit: Payer: Self-pay | Admitting: Adult Health

## 2021-01-02 VITALS — BP 132/77 | HR 63 | Temp 98.2°F | Ht 63.0 in | Wt 185.0 lb

## 2021-01-02 DIAGNOSIS — Z0000 Encounter for general adult medical examination without abnormal findings: Secondary | ICD-10-CM

## 2021-01-02 DIAGNOSIS — Z79899 Other long term (current) drug therapy: Secondary | ICD-10-CM | POA: Diagnosis not present

## 2021-01-02 DIAGNOSIS — Z760 Encounter for issue of repeat prescription: Secondary | ICD-10-CM

## 2021-01-02 DIAGNOSIS — Z0100 Encounter for examination of eyes and vision without abnormal findings: Secondary | ICD-10-CM | POA: Diagnosis not present

## 2021-01-02 DIAGNOSIS — E118 Type 2 diabetes mellitus with unspecified complications: Secondary | ICD-10-CM

## 2021-01-02 NOTE — Patient Instructions (Signed)
[name] , Thank you for taking time to come for your Medicare Wellness Visit. I appreciate your ongoing commitment to your health goals. Please review the following plan we discussed and let me know if I can assist you in the future.   Screening recommendations/referrals: Colonoscopy: 10/08/2017  due 2024 Mammogram: 01/26/2020 Bone Density: 10/15/2018 Recommended yearly ophthalmology/optometry visit for glaucoma screening and checkup Recommended yearly dental visit for hygiene and checkup  Vaccinations: Influenza vaccine: due in fall 2022  Pneumococcal vaccine: completed series  Tdap vaccine: 03/20/2012 Shingles vaccine: will consider  Advanced directives: copies on file   Conditions/risks identified: Patient is experiencing financial difficulties with medication and food.  Next appointment: 01/05/2021  130pm  [name]    Preventive Care 3 Years and Older, Female Preventive care refers to lifestyle choices and visits with your health care provider that can promote health and wellness. What does preventive care include? A yearly physical exam. This is also called an annual well check. Dental exams once or twice a year. Routine eye exams. Ask your health care provider how often you should have your eyes checked. Personal lifestyle choices, including: Daily care of your teeth and gums. Regular physical activity. Eating a healthy diet. Avoiding tobacco and drug use. Limiting alcohol use. Practicing safe sex. Taking low-dose aspirin every day. Taking vitamin and mineral supplements as recommended by your health care provider. What happens during an annual well check? The services and screenings done by your health care provider during your annual well check will depend on your age, overall health, lifestyle risk factors, and family history of disease. Counseling  Your health care provider may ask you questions about your: Alcohol use. Tobacco use. Drug  use. Emotional well-being. Home and relationship well-being. Sexual activity. Eating habits. History of falls. Memory and ability to understand (cognition). Work and work Statistician. Reproductive health. Screening  You may have the following tests or measurements: Height, weight, and BMI. Blood pressure. Lipid and cholesterol levels. These may be checked every 5 years, or more frequently if you are over 57 years old. Skin check. Lung cancer screening. You may have this screening every year starting at age 64 if you have a 30-pack-year history of smoking and currently smoke or have quit within the past 15 years. Fecal occult blood test (FOBT) of the stool. You may have this test every year starting at age 58. Flexible sigmoidoscopy or colonoscopy. You may have a sigmoidoscopy every 5 years or a colonoscopy every 10 years starting at age 74. Hepatitis C blood test. Hepatitis B blood test. Sexually transmitted disease (STD) testing. Diabetes screening. This is done by checking your blood sugar (glucose) after you have not eaten for a while (fasting). You may have this done every 1-3 years. Bone density scan. This is done to screen for osteoporosis. You may have this done starting at age 14. Mammogram. This may be done every 1-2 years. Talk to your health care provider about how often you should have regular mammograms. Talk with your health care provider about your test results, treatment options, and if necessary, the need for more tests. Vaccines  Your health care provider may recommend certain vaccines, such as: Influenza vaccine. This is recommended every year. Tetanus, diphtheria, and acellular pertussis (Tdap, Td) vaccine. You may need a Td booster every 10 years. Zoster vaccine. You may need this after age 73. Pneumococcal 13-valent conjugate (PCV13) vaccine. One dose is recommended after age 86. Pneumococcal polysaccharide (PPSV23) vaccine. One dose is recommended after  age  47. Talk to your health care provider about which screenings and vaccines you need and how often you need them. This information is not intended to replace advice given to you by your health care provider. Make sure you discuss any questions you have with your health care provider. Document Released: 04/29/2015 Document Revised: 12/21/2015 Document Reviewed: 02/01/2015 Elsevier Interactive Patient Education  2017 [place] Prevention in the Home Falls can cause injuries. They can happen to people of all ages. There are many things you can do to make your home safe and to help prevent falls. What can I do on the outside of my home? Regularly fix the edges of walkways and driveways and fix any cracks. Remove anything that might make you trip as you walk through a door, such as a raised step or threshold. Trim any bushes or trees on the path to your home. Use bright outdoor lighting. Clear any walking paths of anything that might make someone trip, such as rocks or tools. Regularly check to see if handrails are loose or broken. Make sure that both sides of any steps have handrails. Any raised decks and porches should have guardrails on the edges. Have any leaves, snow, or ice cleared regularly. Use sand or salt on walking paths during winter. Clean up any spills in your garage right away. This includes oil or grease spills. What can I do in the bathroom? Use night lights. Install grab bars by the toilet and in the tub and shower. Do not use towel bars as grab bars. Use non-skid mats or decals in the tub or shower. If you need to sit down in the shower, use a plastic, non-slip stool. Keep the floor dry. Clean up any water that spills on the floor as soon as it happens. Remove soap buildup in the tub or shower regularly. Attach bath mats securely with double-sided non-slip rug tape. Do not have throw rugs and other things on the floor that can make you trip. What can I do in the  bedroom? Use night lights. Make sure that you have a light by your bed that is easy to reach. Do not use any sheets or blankets that are too big for your bed. They should not hang down onto the floor. Have a firm chair that has side arms. You can use this for support while you get dressed. Do not have throw rugs and other things on the floor that can make you trip. What can I do in the kitchen? Clean up any spills right away. Avoid walking on wet floors. Keep items that you use a lot in easy-to-reach places. If you need to reach something above you, use a strong step stool that has a grab bar. Keep electrical cords out of the way. Do not use floor polish or wax that makes floors slippery. If you must use wax, use non-skid floor wax. Do not have throw rugs and other things on the floor that can make you trip. What can I do with my stairs? Do not leave any items on the stairs. Make sure that there are handrails on both sides of the stairs and use them. Fix handrails that are broken or loose. Make sure that handrails are as long as the stairways. Check any carpeting to make sure that it is firmly attached to the stairs. Fix any carpet that is loose or worn. Avoid having throw rugs at the top or bottom of the stairs. If you do  have throw rugs, attach them to the floor with carpet tape. Make sure that you have a light switch at the top of the stairs and the bottom of the stairs. If you do not have them, ask someone to add them for you. What else can I do to help prevent falls? Wear shoes that: Do not have high heels. Have rubber bottoms. Are comfortable and fit you well. Are closed at the toe. Do not wear sandals. If you use a stepladder: Make sure that it is fully opened. Do not climb a closed stepladder. Make sure that both sides of the stepladder are locked into place. Ask someone to hold it for you, if possible. Clearly mark and make sure that you can see: Any grab bars or  handrails. First and last steps. Where the edge of each step is. Use tools that help you move around (mobility aids) if they are needed. These include: Canes. Walkers. Scooters. Crutches. Turn on the lights when you go into a dark area. Replace any light bulbs as soon as they burn out. Set up your furniture so you have a clear path. Avoid moving your furniture around. If any of your floors are uneven, fix them. If there are any pets around you, be aware of where they are. Review your medicines with your doctor. Some medicines can make you feel dizzy. This can increase your chance of falling. Ask your doctor what other things that you can do to help prevent falls. This information is not intended to replace advice given to you by your health care provider. Make sure you discuss any questions you have with your health care provider. Document Released: 01/27/2009 Document Revised: 09/08/2015 Document Reviewed: 05/07/2014 Elsevier Interactive Patient Education  2017 [employer].

## 2021-01-02 NOTE — Progress Notes (Addendum)
Subjective:   Seng Fouts is a 70 y.o. female who presents for an Initial Medicare Annual Wellness Visit.  Review of Systems    N/a Cardiac Risk Factors include: advanced age (>64mn, >>24women);diabetes mellitus;dyslipidemia;hypertension     Objective:    Today's Vitals   01/02/21 0825  BP: 132/77  Pulse: 63  Temp: 98.2 F (36.8 C)  SpO2: 95%  Weight: 185 lb (83.9 kg)  Height: _0  (1.6 m)   Body mass index is 32.77 kg/m.  Advanced Directives 01/02/2021 10/31/2020 01/01/2020 10/16/2016 05/17/2015 11/19/2014  Does Patient Have a Medical Advance Directive? Yes Yes No No No No  Type of AParamedicof ABryans RoadLiving will [place]Living will - - - -  Does patient want to make changes to medical advance directive? - No - Patient declined - - - -  Copy of [place] Chart? No - copy requested Yes - validated most recent copy scanned in chart (See row information) - - - -  Would patient like information on creating a medical advance directive? - - No - Patient declined - No - patient declined information Yes - Educational materials given    Current Medications (verified) Outpatient Encounter Medications as of 01/02/2021  Medication Sig   acetaminophen (TYLENOL) 500 MG tablet Take 1,000 mg by mouth every 8 (eight) hours as needed.   aspirin 81 MG tablet Take 81 mg by mouth at bedtime.   atorvastatin (LIPITOR) 10 MG tablet Take 1 tablet (10 mg total) by mouth daily.   blood glucose meter kit and supplies KIT Dispense based on patient and insurance preference. Use up to four times daily as directed.   cholecalciferol (VITAMIN D3) 25 MCG (1000 UT) tablet Take 1,000 Units by mouth daily.   citalopram (CELEXA) 20 MG tablet TAKE 1 TABLET(20 MG) BY MOUTH DAILY   Cyanocobalamin (VITAMIN B-12 PO) Take 1 tablet by mouth daily.   glucose blood test strip Use to check blood glucose TID   levothyroxine (SYNTHROID) 88 MCG  tablet TAKE 1 TABLET(88 MCG) BY MOUTH DAILY   losartan-hydrochlorothiazide (HYZAAR) 100-25 MG tablet TAKE 1 TABLET BY MOUTH DAILY   metFORMIN (GLUCOPHAGE) 500 MG tablet TAKE 1 TABLET(500 MG) BY MOUTH TWICE DAILY WITH A MEAL   pramipexole (MIRAPEX) 1 MG tablet TAKE 1 TABLET(1 MG) BY MOUTH DAILY   Prenatal Multivit-Min-Fe-FA (PRE-NATAL PO)    No facility-administered encounter medications on file as of 01/02/2021.    Allergies (verified) Benadryl [diphenhydramine hcl], Ace inhibitors, Amoxicillin, Aspirin, Chlorphen-phenyleph-methscop, Codeine, Diphenhydramine hcl, Fish allergy, Hydrocodone, Indomethacin, Pentazocine lactate, Promethazine hcl, and Quinine   History: Past Medical History:  Diagnosis Date   Allergy    Anemia    Anxiety    ANXIETY DEPRESSION 09/15/2007   Arthritis    ASTHMA 05/15/2009   Asthma    DIABETES MELLITUS, TYPE II 12/11/2006   DIVERTICULOSIS, COLON 12/11/2006   Edema 01/15/2009   Gout    pt denies; toe was broken   Headache(784.0) 12/11/2006   Hyperlipidemia    HYPERTENSION NEC 08/14/2007   HYPOTHYROIDISM 12/11/2006   PELVIC PAIN, CHRONIC 08/14/2007   Restless leg syndrome    Past Surgical History:  Procedure Laterality Date   ABDOMINAL HYSTERECTOMY     APPENDECTOMY     Bladder Suspension     Family History  Problem Relation Age of Onset   COPD Mother        smoker   Diabetes Mother  COPD Father        smoker   Diabetes Father    Diabetes Sister    Diabetes Mellitus II Sister    Diabetes Sister    Diabetes Sister    Diabetes Sister    Breast cancer Other 106   Lung cancer Nephew 60   Colon cancer Neg Hx    Esophageal cancer Neg Hx    Pancreatic cancer Neg Hx    Social History   Socioeconomic History   Marital status: Married    Spouse name: Not on file   Number of children: Not on file   Years of education: Not on file   Highest education level: Not on file  Occupational History   Not on file  Tobacco Use   Smoking status: Never    Smokeless tobacco: Never  Substance and Sexual Activity   Alcohol use: Yes    Alcohol/week: 0.0 standard drinks    Comment: very rare   Drug use: No   Sexual activity: Not on file  Other Topics Concern   Not on file  Social History Narrative   Not on file   Social Determinants of Health   Financial Resource Strain: Medium Risk   Difficulty of Paying Living Expenses: Somewhat hard  Food Insecurity: No Food Insecurity   Worried About Running Out of Food in the Last Year: Never true   Ran Out of Food in the Last Year: Never true  Transportation Needs: No Transportation Needs   Lack of Transportation (Medical): No   Lack of Transportation (Non-Medical): No  Physical Activity: Inactive   Days of Exercise per Week: 0 days   Minutes of Exercise per Session: 0 min  Stress: Stress Concern Present   Feeling of Stress : To some extent  Social Connections: Socially Integrated   Frequency of Communication with Friends and Family: Three times a week   Frequency of Social Gatherings with Friends and Family: Three times a week   Attends Religious Services: More than 4 times per year   Active Member of Clubs or Organizations: Yes   Attends Music therapist: More than 4 times per year   Marital Status: Married    Tobacco Counseling Counseling given: Not Answered   Clinical Intake:  Pre-visit preparation completed: Yes  Pain : No/denies pain     Nutritional Risks: None Diabetes: Yes CBG done?: No Did pt. bring in CBG monitor from home?: No  How often do you need to have someone help you when you read instructions, pamphlets, or other written materials from your doctor or pharmacy?: 1 - Never What is the last grade level you completed in school?: High School  Diabetic?yes Nutrition Risk Assessment:  Has the patient had any N/V/D within the last 2 months?  No  Does the patient have any non-healing wounds?  Yes  Has the patient had any unintentional weight loss or  weight gain?  No   Diabetes:  Is the patient diabetic?  Yes  If diabetic, was a CBG obtained today?  No  Did the patient bring in their glucometer from home?  No  How often do you monitor your CBG's? daily.   Financial Strains and Diabetes Management:  Are you having any financial strains with the device, your supplies or your medication? No .  Does the patient want to be seen by Chronic Care Management for management of their diabetes?  No  Would the patient like to be referred to a Nutritionist or for  Diabetic Management?  No   Diabetic Exams:  Diabetic Eye Exam: Overdue for diabetic eye exam. Pt has been advised about the importance in completing this exam. Patient advised to call and schedule an eye exam. Diabetic Foot Exam: Overdue, Pt has been advised about the importance in completing this exam. Pt is scheduled for diabetic foot exam on next office visit .   Interpreter Needed?: No  Information entered by :: [place] of Daily Living In your present state of health, do you have any difficulty performing the following activities: 01/02/2021  Hearing? N  Vision? N  Difficulty concentrating or making decisions? N  Walking or climbing stairs? N  Dressing or bathing? N  Doing errands, shopping? N  Preparing Food and eating ? N  Using the Toilet? N  In the past six months, have you accidently leaked urine? N  Do you have problems with loss of bowel control? N  Managing your Medications? N  Managing your Finances? N  Housekeeping or managing your Housekeeping? N  Some recent data might be hidden    Patient Care Team: [name], NP as PCP - General (Family Medicine) Dimitri Ped, RN as Case Manager  Indicate any recent Medical Services you may have received from other than [facility] providers in the past year (date may be approximate).     Assessment:   This is a routine wellness examination for Arayna.  Hearing/Vision screen Vision Screening  - Comments:: Referral eye doctor completed 01/02/2021  Dietary issues and exercise activities discussed: Current Exercise Habits: Home exercise routine;The patient does not participate in regular exercise at present, Exercise limited by: None identified   Goals Addressed             This Visit's Progress    RNCM:Monitor and Manage My Blood Sugar-Diabetes Type 2   On track    Timeframe:  Long-Range Goal Priority:  High Start Date:      10/31/20                       Expected End Date:       03/16/21                Follow Up Date 02/07/21    - check blood sugar at prescribed times - check blood sugar if I feel it is too high or too low - take the blood sugar log to all doctor visits - take the blood sugar meter to all doctor visits    Why is this important?   Checking your blood sugar at home helps to keep it from getting very high or very low.  Writing the results in a diary or log helps the doctor know how to care for you.  Your blood sugar log should have the time, date and the results.  Also, write down the amount of insulin or other medicine that you take.  Other information, like what you ate, exercise done and how you were feeling, will also be helpful.     Notes:        Depression Screen PHQ 2/9 Scores 01/02/2021 10/31/2020 01/01/2020 11/03/2019 11/03/2019 06/24/2018 06/22/2016  PHQ - 2 Score 0 1 2 0 0 0 0  PHQ- 9 Score - - 2 - - - -    Fall Risk Fall Risk  01/02/2021 10/31/2020 01/01/2020 11/03/2019 11/03/2019  Falls in the past year? 1 1 0 0 0  Comment trip and fall on grass - - - -  Number falls in past yr: 0 1 0 - -  Comment - moved too fast - - -  Injury with Fall? 1 0 0 - -  Risk for fall due to : No Fall Risks - Impaired balance/gait - -  Follow up Falls evaluation completed Education provided;Falls prevention discussed Falls evaluation completed;Falls prevention discussed - -    FALL RISK PREVENTION PERTAINING TO THE HOME:  Any stairs in or around the home? No   If so, are there any without handrails? Yes  Home free of loose throw rugs in walkways, pet beds, electrical cords, etc? Yes  Adequate lighting in your home to reduce risk of falls? Yes   ASSISTIVE DEVICES UTILIZED TO PREVENT FALLS:  Life alert? No  Use of a cane, walker or w/c? Yes  Grab bars in the bathroom? Yes  Shower chair or bench in shower? Yes  Elevated toilet seat or a handicapped toilet? No   TIMED UP AND GO:  Was the test performed? Yes .  Length of time to ambulate 10 feet: 10 sec.   Gait steady and fast without use of assistive device  Cognitive Function:     6CIT Screen 01/01/2020  What Year? 0 points  What month? 0 points  What time? 0 points  Count back from 20 0 points  Months in reverse 0 points  Repeat phrase 0 points  Total Score 0    Immunizations Immunization History  Administered Date(s) Administered   Influenza Whole 01/18/2009   Influenza, High Dose Seasonal PF 04/22/2018   Influenza,inj,Quad PF,6+ Mos 05/20/2014, 01/23/2017   Influenza-Unspecified 01/15/2015   PFIZER(Purple Top)SARS-COV-2 Vaccination 06/27/2019, 07/21/2019   Pneumococcal Conjugate-13 04/22/2018   Pneumococcal Polysaccharide-23 05/20/2014, 11/03/2019   Td 04/16/2005, 03/20/2012   Zoster, Live 05/28/2014    TDAP status: Up to date  Flu Vaccine status: Up to date  Pneumococcal vaccine status: Up to date  Covid-19 vaccine status: Completed vaccines  Qualifies for Shingles Vaccine? Yes   Zostavax completed No   Shingrix Completed?: No.    Education has been provided regarding the importance of this vaccine. Patient has been advised to call insurance company to determine out of pocket expense if they have not yet received this vaccine. Advised may also receive vaccine at local pharmacy or Health Dept. Verbalized acceptance and understanding.  Screening Tests Health Maintenance  Topic Date Due   Zoster Vaccines- Shingrix (1 of 2) Never done   OPHTHALMOLOGY EXAM   01/24/2019   COVID-19 Vaccine (3 - Pfizer risk series) 08/18/2019   HEMOGLOBIN A1C  10/26/2020   FOOT EXAM  11/02/2020   INFLUENZA VACCINE  11/14/2020   MAMMOGRAM  01/25/2021   TETANUS/TDAP  03/20/2022   COLONOSCOPY (Pts 45-53yr Insurance coverage will need to be confirmed)  10/09/2022   DEXA SCAN  Completed   Hepatitis C Screening  Completed   HPV VACCINES  Aged Out    Health Maintenance  Health Maintenance Due  Topic Date Due   Zoster Vaccines- Shingrix (1 of 2) Never done   OPHTHALMOLOGY EXAM  01/24/2019   COVID-19 Vaccine (3 - Pfizer risk series) 08/18/2019   HEMOGLOBIN A1C  10/26/2020   FOOT EXAM  11/02/2020   INFLUENZA VACCINE  11/14/2020    Colorectal cancer screening: Type of screening: Colonoscopy. Completed 10/08/2017. Repeat every 5 years  Mammogram status: Completed 01/26/2020. Repeat every year  Bone Density status: Completed 10/15/2018. Results reflect: Bone density results: OSTEOPENIA. Repeat every 5 years.  Lung Cancer Screening: (Low Dose CT  Chest recommended if Age 54-80 years, 30 pack-year currently smoking OR have quit w/in 15years.) does not qualify.   Lung Cancer Screening Referral: n/a  Additional Screening:  Hepatitis C Screening: does not qualify; Completed 06/13/2017  Vision Screening: Recommended annual ophthalmology exams for early detection of glaucoma and other disorders of the eye. Is the patient up to date with their annual eye exam?  No  Who is the provider or what is the name of the office in which the patient attends annual eye exams? Referral completed 01/02/2021 If pt is not established with a provider, would they like to be referred to a provider to establish care? Yes .   Dental Screening: Recommended annual dental exams for proper oral hygiene  Community Resource Referral / Chronic Care Management: CRR required this visit?  Yes   CCM required this visit?  Yes      Plan:     I have personally reviewed and noted the  following in the patient's chart:   Medical and social history Use of alcohol, tobacco or illicit drugs  Current medications and supplements including opioid prescriptions. Patient is not currently taking opioid prescriptions. Functional ability and status Nutritional status Physical activity Advanced directives List of other physicians Hospitalizations, surgeries, and ER visits in previous 12 months Vitals Screenings to include cognitive, depression, and falls Referrals and appointments  In addition, I have reviewed and discussed with patient certain preventive protocols, quality metrics, and best practice recommendations. A written personalized care plan for preventive services as well as general preventive health recommendations were provided to patient.     Randel Pigg, LPN   07/31/5299   Nurse Notes: Patient is experiencing financial difficulties with medication and food.

## 2021-01-04 ENCOUNTER — Other Ambulatory Visit: Payer: Self-pay

## 2021-01-05 ENCOUNTER — Encounter: Payer: Self-pay | Admitting: Adult Health

## 2021-01-05 ENCOUNTER — Ambulatory Visit (INDEPENDENT_AMBULATORY_CARE_PROVIDER_SITE_OTHER): Payer: Medicare Other | Admitting: Adult Health

## 2021-01-05 VITALS — BP 140/86 | HR 74 | Temp 98.5°F | Wt 185.0 lb

## 2021-01-05 DIAGNOSIS — F32A Depression, unspecified: Secondary | ICD-10-CM | POA: Diagnosis not present

## 2021-01-05 DIAGNOSIS — Z23 Encounter for immunization: Secondary | ICD-10-CM | POA: Diagnosis not present

## 2021-01-05 DIAGNOSIS — E1165 Type 2 diabetes mellitus with hyperglycemia: Secondary | ICD-10-CM | POA: Diagnosis not present

## 2021-01-05 DIAGNOSIS — F419 Anxiety disorder, unspecified: Secondary | ICD-10-CM | POA: Diagnosis not present

## 2021-01-05 DIAGNOSIS — N898 Other specified noninflammatory disorders of vagina: Secondary | ICD-10-CM | POA: Diagnosis not present

## 2021-01-05 DIAGNOSIS — G2581 Restless legs syndrome: Secondary | ICD-10-CM

## 2021-01-05 LAB — POCT GLYCOSYLATED HEMOGLOBIN (HGB A1C): Hemoglobin A1C: 6.8 % — AB (ref 4.0–5.6)

## 2021-01-05 MED ORDER — CITALOPRAM HYDROBROMIDE 20 MG PO TABS: ORAL_TABLET | ORAL | 1 refills | Status: DC

## 2021-01-05 MED ORDER — FLUCONAZOLE 150 MG PO TABS: 150.0000 mg | ORAL_TABLET | Freq: Once | ORAL | 1 refills | Status: AC

## 2021-01-05 MED ORDER — PRAMIPEXOLE DIHYDROCHLORIDE 1.5 MG PO TABS: 1.5000 mg | ORAL_TABLET | Freq: Every evening | ORAL | 1 refills | Status: DC

## 2021-01-05 NOTE — Progress Notes (Signed)
Subjective:    Patient ID: [name], female    DOB: [birth year], 70 y.o.   MRN: [id number]  HPI 70 year old female who  has a past medical history of Allergy, Anemia, Anxiety, ANXIETY DEPRESSION (09/15/2007), Arthritis, ASTHMA (05/15/2009), Asthma, DIABETES MELLITUS, TYPE II (12/11/2006), DIVERTICULOSIS, COLON (12/11/2006), Edema (01/15/2009), Gout, Headache(784.0) (12/11/2006), Hyperlipidemia, HYPERTENSION NEC (08/14/2007), HYPOTHYROIDISM (12/11/2006), PELVIC PAIN, CHRONIC (08/14/2007), and Restless leg syndrome.  He was seen on 12/14/2020 for an acute issue of right second toe pain.  3 days prior she stubbed her toe on a leg of a metal chair.  She had pain across the top of her toes, bruising, and concern for infection due to blister on her right second toe.  She was able to ambulate and bear weight but his parents pain in doing so.  She had been buddy taping her toes to provide support  She had some mild bruising across the dorsal aspect of her right foot and a blister on her second toe.  There were no signs of infection at this time.  Today she reports that the blister has gone away but she is left with a brown scab on her second toe   Also reporting that over the last month she has been experiencing blood sugars in the 130-140's with the highest being 159 - fasting. She is taking Metformin 500 mg BID. Reports that she has been drinking sodas more often. Still eats pasta and bread but nothing in excess. Denies hypoglycemia.   Lab Results  Component Value Date   HGBA1C 6.8 (A) 01/05/2021   She also reports vaginal itching and odor. Denies vaginal discharge. No UTI like symptoms   Also takes Mirapex 1 mg QHS for restless leg syndrome. Reports that this has been the best medication she has used. Has been experiencing worsening restless legs recently. She is wondering if we can go up the medication a little bit.   Also needs refill of Celexa  Review of Systems See HPI   Past  Medical History:  Diagnosis Date   Allergy    Anemia    Anxiety    ANXIETY DEPRESSION 09/15/2007   Arthritis    ASTHMA 05/15/2009   Asthma    DIABETES MELLITUS, TYPE II 12/11/2006   DIVERTICULOSIS, COLON 12/11/2006   Edema 01/15/2009   Gout    pt denies; toe was broken   Headache(784.0) 12/11/2006   Hyperlipidemia    HYPERTENSION NEC 08/14/2007   HYPOTHYROIDISM 12/11/2006   PELVIC PAIN, CHRONIC 08/14/2007   Restless leg syndrome     Social History   Socioeconomic History   Marital status: Married    Spouse name: Not on file   Number of children: Not on file   Years of education: Not on file   Highest education level: Not on file  Occupational History   Not on file  Tobacco Use   Smoking status: Never   Smokeless tobacco: Never  Substance and Sexual Activity   Alcohol use: Yes    Alcohol/week: 0.0 standard drinks    Comment: very rare   Drug use: No   Sexual activity: Not on file  Other Topics Concern   Not on file  Social History Narrative   Not on file   Social Determinants of Health   Financial Resource Strain: Medium Risk   Difficulty of Paying Living Expenses: Somewhat hard  Food Insecurity: No Food Insecurity   Worried About Running Out of Food in the Last Year: Never true  Ran Out of Food in the Last Year: Never true  Transportation Needs: No Transportation Needs   Lack of Transportation (Medical): No   Lack of Transportation (Non-Medical): No  Physical Activity: Inactive   Days of Exercise per Week: 0 days   Minutes of Exercise per Session: 0 min  Stress: Stress Concern Present   Feeling of Stress : To some extent  Social Connections: Socially Integrated   Frequency of Communication with Friends and Family: Three times a week   Frequency of Social Gatherings with Friends and Family: Three times a week   Attends Religious Services: More than 4 times per year   Active Member of Clubs or Organizations: Yes   Attends Music therapist: More than  4 times per year   Marital Status: Married  Human resources officer Violence: Not At Risk   Fear of Current or Ex-Partner: No   Emotionally Abused: No   Physically Abused: No   Sexually Abused: No    Past Surgical History:  Procedure Laterality Date   ABDOMINAL HYSTERECTOMY     APPENDECTOMY     Bladder Suspension      Family History  Problem Relation Age of Onset   COPD Mother        smoker   Diabetes Mother    COPD Father        smoker   Diabetes Father    Diabetes Sister    Diabetes Mellitus II Sister    Diabetes Sister    Diabetes Sister    Diabetes Sister    Breast cancer Other 68   Lung cancer Nephew 60   Colon cancer Neg Hx    Esophageal cancer Neg Hx    Pancreatic cancer Neg Hx     Allergies  Allergen Reactions   Benadryl [Diphenhydramine Hcl] Anaphylaxis   Ace Inhibitors Cough   Amoxicillin    Aspirin    Chlorphen-Phenyleph-Methscop     REACTION: rash   Codeine    Diphenhydramine Hcl     REACTION: swelling-mouth   Fish Allergy    Hydrocodone    Indomethacin    Pentazocine Lactate     REACTION: passing out   Promethazine Hcl    Quinine     REACTION: itching redface    Current Outpatient Medications on File Prior to Visit  Medication Sig Dispense Refill   acetaminophen (TYLENOL) 500 MG tablet Take 1,000 mg by mouth every 8 (eight) hours as needed.     aspirin 81 MG tablet Take 81 mg by mouth at bedtime.     atorvastatin (LIPITOR) 10 MG tablet Take 1 tablet (10 mg total) by mouth daily. 90 tablet 3   blood glucose meter kit and supplies KIT Dispense based on patient and insurance preference. Use up to four times daily as directed. 1 each 0   cholecalciferol (VITAMIN D3) 25 MCG (1000 UT) tablet Take 1,000 Units by mouth daily.     citalopram (CELEXA) 20 MG tablet TAKE 1 TABLET(20 MG) BY MOUTH DAILY 90 tablet 1   Cyanocobalamin (VITAMIN B-12 PO) Take 1 tablet by mouth daily.     glucose blood test strip Use to check blood glucose TID 200 each 0    levothyroxine (SYNTHROID) 88 MCG tablet TAKE 1 TABLET(88 MCG) BY MOUTH DAILY 90 tablet 3   losartan-hydrochlorothiazide (HYZAAR) 100-25 MG tablet TAKE 1 TABLET BY MOUTH DAILY 90 tablet 1   metFORMIN (GLUCOPHAGE) 500 MG tablet TAKE 1 TABLET(500 MG) BY MOUTH TWICE DAILY WITH A MEAL  180 tablet 0   Prenatal Multivit-Min-Fe-FA (PRE-NATAL PO)      [DISCONTINUED] pramipexole (MIRAPEX) 1 MG tablet TAKE 1 TABLET(1 MG) BY MOUTH DAILY 30 tablet 0   No current facility-administered medications on file prior to visit.    BP 140/86   Pulse 74   Temp 98.5 F (36.9 C) (Oral)   Wt 185 lb (83.9 kg)   SpO2 97%   BMI 32.77 kg/m       Objective:   Physical Exam Vitals and nursing note reviewed.  Constitutional:      Appearance: Normal appearance.  Cardiovascular:     Rate and Rhythm: Normal rate and regular rhythm.     Pulses: Normal pulses.     Heart sounds: Normal heart sounds.  Pulmonary:     Effort: Pulmonary effort is normal.     Breath sounds: Normal breath sounds.  Skin:    General: Skin is warm and dry.     Capillary Refill: Capillary refill takes less than 2 seconds.  Neurological:     General: No focal deficit present.     Mental Status: She is alert and oriented to person, place, and time.       Assessment & Plan:   1. Uncontrolled type 2 diabetes mellitus with hyperglycemia (HCC)  - POC HgB A1c - 6.8 - has increased slightly but still at goal  - No change in medication - Advised lifestyle modifications  - Follow up in three months   2. Anxiety and depression  - citalopram (CELEXA) 20 MG tablet; TAKE 1 TABLET(20 MG) BY MOUTH DAILY  Dispense: 90 tablet; Refill: 1  3. Restless leg syndrome  - pramipexole (MIRAPEX) 1.5 MG tablet; Take 1 tablet (1.5 mg total) by mouth at bedtime.  Dispense: 90 tablet; Refill: 1  4. Vaginal itching - Likely yeast infection  - fluconazole (DIFLUCAN) 150 MG tablet; Take 1 tablet (150 mg total) by mouth once for 1 dose.  Dispense: 1 tablet;  Refill: 1   [name], NP

## 2021-01-05 NOTE — Patient Instructions (Signed)
Your A1c was up to 6.8 - cut back on sugars and carbs. No changes in medications  I have sent in Diflucan for your yeast infection   I have increased Mirapex to 1.5 mg nightly.   Please follow up in 3 months for your physical exam

## 2021-01-13 ENCOUNTER — Telehealth: Payer: Self-pay | Admitting: *Deleted
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
## 2021-01-26 IMAGING — MG DIGITAL SCREENING BILATERAL MAMMOGRAM WITH TOMO AND CAD
8 series · 8 of 24 positions shown · non-contrast
Comparison: Previous exam(s).

CLINICAL DATA: Screening.

EXAM:
DIGITAL SCREENING BILATERAL MAMMOGRAM WITH TOMO AND CAD

[R MLO synth-2D]
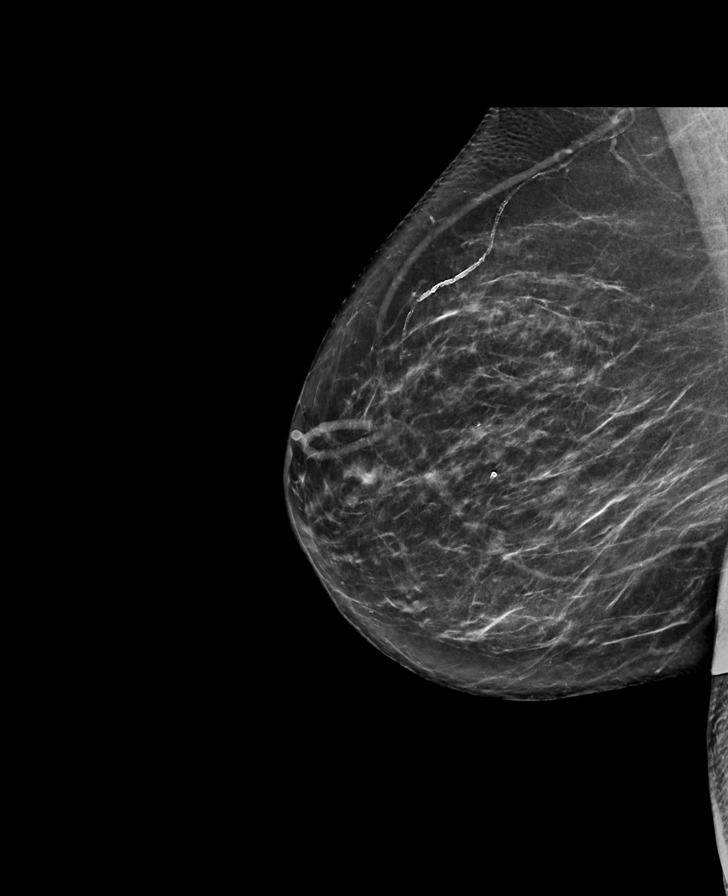

[L MLO synth-2D]
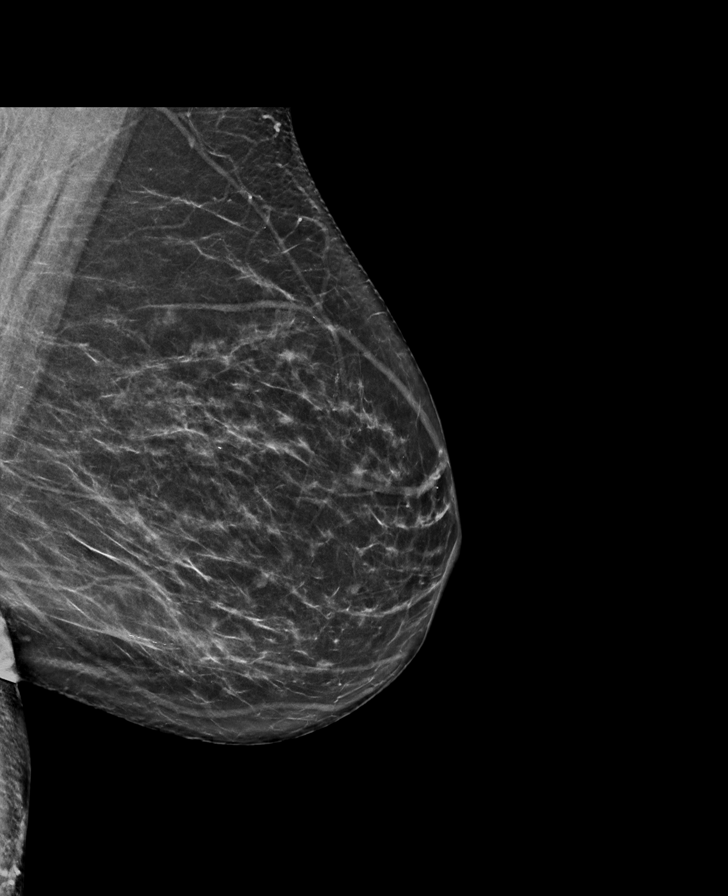

[R CC synth-2D]
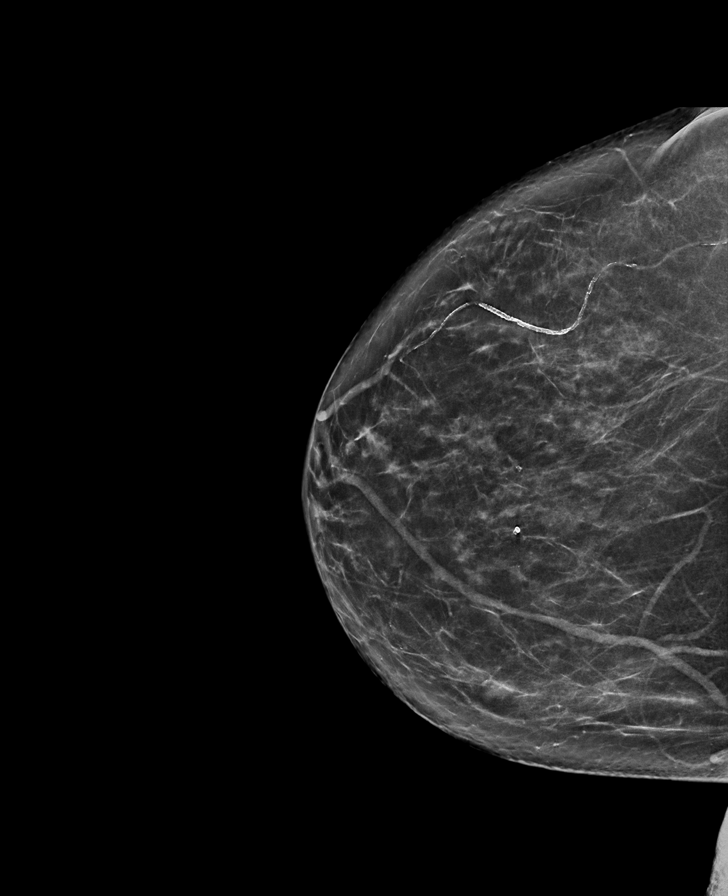

[L CC synth-2D]
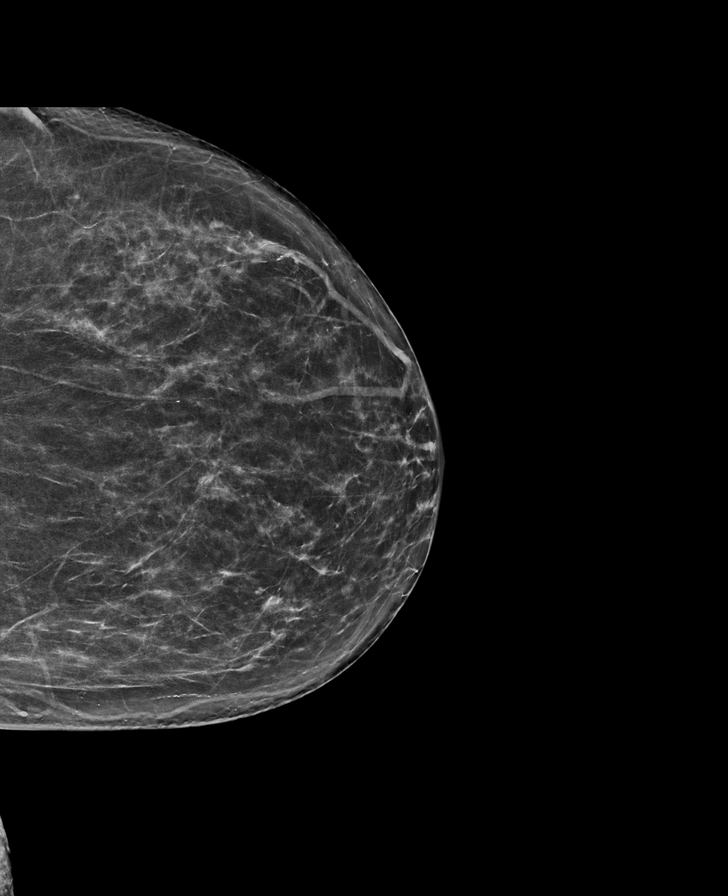

[R CC tomo · tomo slice 36/71.0]
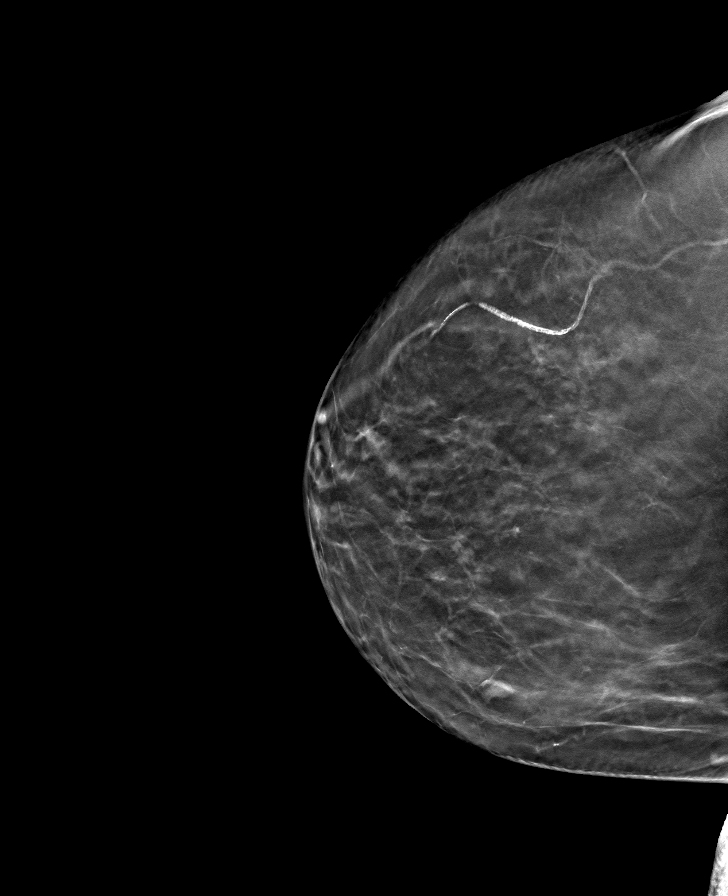

[L MLO tomo · tomo slice 37/74.0]
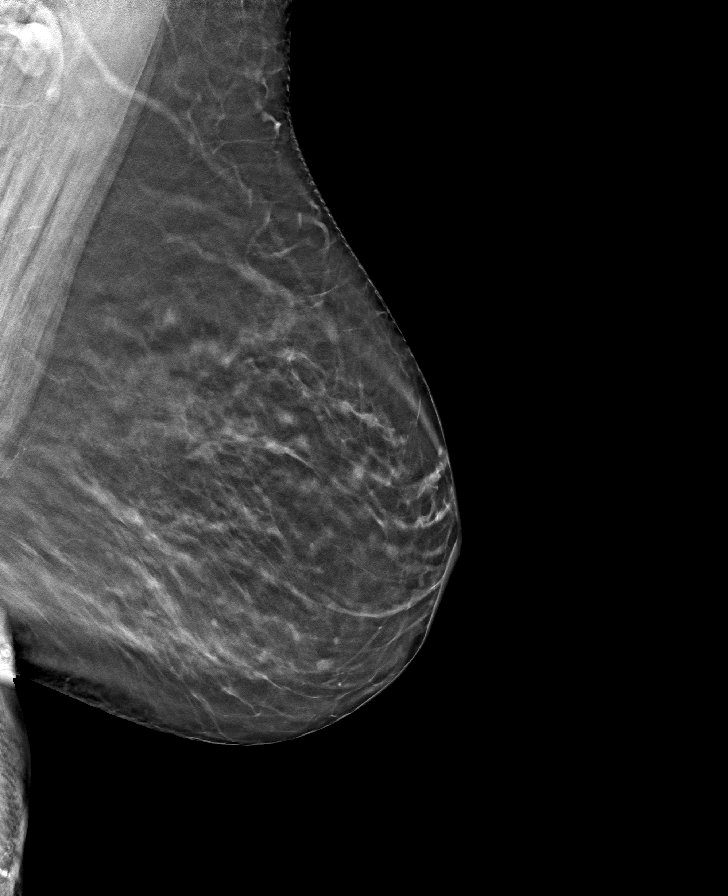

[R MLO tomo · tomo slice 38/75.0]
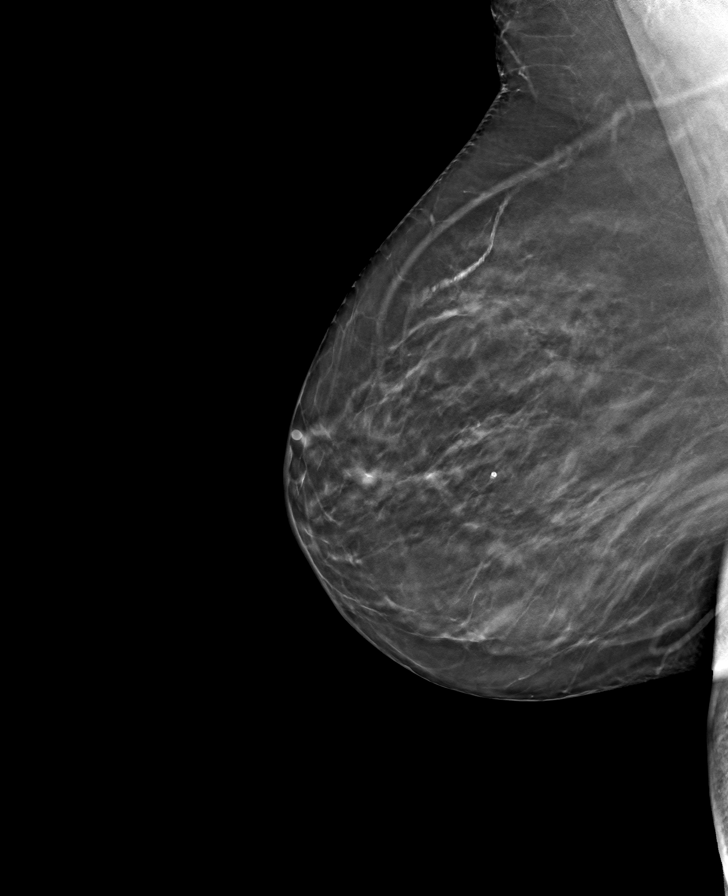

[L CC tomo · tomo slice 35/68.0]
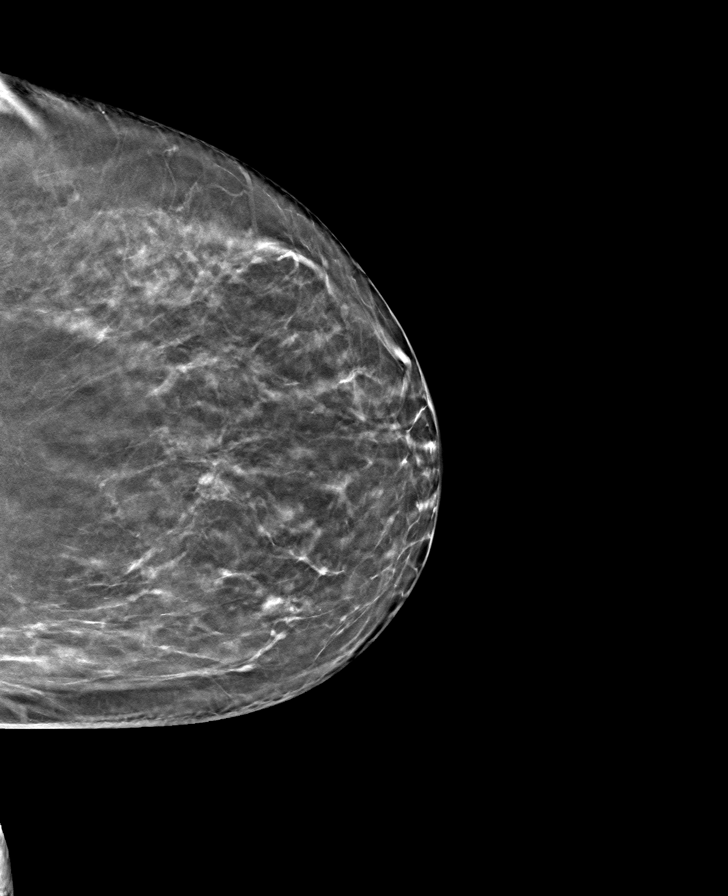

[8 of 24 positions shown; findings below may reference images not displayed]

ACR Breast Density Category b: There are scattered areas of
fibroglandular density.
FINDINGS: There are no findings suspicious for malignancy. Images were
processed with CAD.
IMPRESSION: No mammographic evidence of malignancy. A result letter of this
screening mammogram will be mailed directly to the patient.

RECOMMENDATION:
Screening mammogram in one year. (Code:[id number])

BI-RADS CATEGORY  1: Negative.

## 2021-02-01 ENCOUNTER — Telehealth: Payer: Self-pay | Admitting: *Deleted

## 2021-02-01 NOTE — Telephone Encounter (Signed)
   Telephone encounter was:  Successful.  02/01/2021 Name: Dare Spillman MRN: [id number] DOB: 10/07/1950  Haillie Radu is a 70 y.o. year old female who is a primary care patient of [name], NP . The community resource team was consulted for assistance with Food InsecurityPresently not in need of offered services patient and husband doing much better and will let us know if her needs change   Care guide performed the following interventions: Patient provided with information about care guide support team and interviewed to confirm resource needs Follow up call placed to community resources to determine status of patients referral.  Follow Up Plan:  No further follow up planned at this time. The patient has been provided with needed resources.  [place], Care Management  [phone] [address] , [place] [address] Email : [email or web address]

## 2021-02-07 ENCOUNTER — Telehealth: Payer: Medicare Other

## 2021-02-10 ENCOUNTER — Ambulatory Visit (INDEPENDENT_AMBULATORY_CARE_PROVIDER_SITE_OTHER): Payer: Medicare Other

## 2021-02-10 DIAGNOSIS — E1165 Type 2 diabetes mellitus with hyperglycemia: Secondary | ICD-10-CM

## 2021-02-10 DIAGNOSIS — I10 Essential (primary) hypertension: Secondary | ICD-10-CM

## 2021-02-10 DIAGNOSIS — E782 Mixed hyperlipidemia: Secondary | ICD-10-CM

## 2021-02-10 NOTE — Patient Instructions (Signed)
Visit Information  PATIENT GOALS:  Goals Addressed             This Visit's Progress    RNCM Manage My Cholesterol   On track    Timeframe:  Long-Range Goal Priority:  Medium Start Date:      10/31/20                       Expected End Date:   07/15/21                    Follow Up Date 03/27/21    - change to whole grain breads, cereal, pasta - eat smaller or less servings of red meat - fill half the plate with nonstarchy vegetables - get blood test (fasting) done 1 week before next visit - increase the amount of fiber in food - read food labels for fat and fiber - switch to low-fat or skim milk    Why is this important?   Changing cholesterol starts with eating heart-healthy foods.  Other steps may be to increase your activity and to quit if you smoke.    Notes:      RNCM:Monitor and Manage My Blood Sugar-Diabetes Type 2   On track    Timeframe:  Long-Range Goal Priority:  High Start Date:      10/31/20                       Expected End Date:       07/15/21               Follow Up Date 03/27/21    - check blood sugar at prescribed times - check blood sugar if I feel it is too high or too low - take the blood sugar log to all doctor visits - take the blood sugar meter to all doctor visits    Why is this important?   Checking your blood sugar at home helps to keep it from getting very high or very low.  Writing the results in a diary or log helps the doctor know how to care for you.  Your blood sugar log should have the time, date and the results.  Also, write down the amount of insulin or other medicine that you take.  Other information, like what you ate, exercise done and how you were feeling, will also be helpful.     Notes:      RNCM:Track and Manage My Blood Pressure-Hypertension   On track    Timeframe:  Long-Range Goal Priority:  Medium Start Date:    10/31/20                         Expected End Date:    07/15/21                  Follow Up Date 07/15/21/22     - check blood pressure weekly - choose a place to take my blood pressure (home, clinic or office, retail store) - write blood pressure results in a log or diary    Why is this important?   You won't feel high blood pressure, but it can still hurt your blood vessels.  High blood pressure can cause heart or kidney problems. It can also cause a stroke.  Making lifestyle changes like losing a little weight or eating less salt will help.  Checking your blood pressure at  home and at different times of the day can help to control blood pressure.  If the doctor prescribes medicine remember to take it the way the doctor ordered.  Call the office if you cannot afford the medicine or if there are questions about it.     Notes:         Patient verbalizes understanding of instructions provided today and agrees to view in [place].   Telephone follow up appointment with care management team member scheduled for: 03/27/21 at 1 PM [place], [facility], CDE Care Management Coordinator [facility] [phone], [place] [phone]

## 2021-02-10 NOTE — Chronic Care Management (AMB) (Signed)
Chronic Care Management   CCM RN Visit Note  02/10/2021 Name: [name] MRN: [id number] DOB: 12-02-1950  Subjective: [name] is a 70 y.o. year old female who is a primary care patient of [name], NP. The care management team was consulted for assistance with disease management and care coordination needs.    Engaged with patient by telephone for follow up visit in response to provider referral for case management and/or care coordination services.   Consent to Services:  The patient was given information about Chronic Care Management services, agreed to services, and gave verbal consent prior to initiation of services.  Please see initial visit note for detailed documentation.   Patient agreed to services and verbal consent obtained.   Assessment: Review of patient past medical history, allergies, medications, health status, including review of consultants reports, laboratory and other test data, was performed as part of comprehensive evaluation and provision of chronic care management services.   SDOH (Social Determinants of Health) assessments and interventions performed:    CCM Care Plan  Allergies  Allergen Reactions   Benadryl [Diphenhydramine Hcl] Anaphylaxis   Ace Inhibitors Cough   Amoxicillin    Aspirin    Chlorphen-Phenyleph-Methscop     REACTION: rash   Codeine    Diphenhydramine Hcl     REACTION: swelling-mouth   Fish Allergy    Hydrocodone    Indomethacin    Pentazocine Lactate     REACTION: passing out   Promethazine Hcl    Quinine     REACTION: itching redface    Outpatient Encounter Medications as of 02/10/2021  Medication Sig   acetaminophen (TYLENOL) 500 MG tablet Take 1,000 mg by mouth every 8 (eight) hours as needed.   aspirin 81 MG tablet Take 81 mg by mouth at bedtime.   atorvastatin (LIPITOR) 10 MG tablet Take 1 tablet (10 mg total) by mouth daily.   blood glucose meter kit and supplies KIT Dispense based  on patient and insurance preference. Use up to four times daily as directed.   cholecalciferol (VITAMIN D3) 25 MCG (1000 UT) tablet Take 1,000 Units by mouth daily.   citalopram (CELEXA) 20 MG tablet TAKE 1 TABLET(20 MG) BY MOUTH DAILY   Cyanocobalamin (VITAMIN B-12 PO) Take 1 tablet by mouth daily.   glucose blood test strip Use to check blood glucose TID   levothyroxine (SYNTHROID) 88 MCG tablet TAKE 1 TABLET(88 MCG) BY MOUTH DAILY   losartan-hydrochlorothiazide (HYZAAR) 100-25 MG tablet TAKE 1 TABLET BY MOUTH DAILY   metFORMIN (GLUCOPHAGE) 500 MG tablet TAKE 1 TABLET(500 MG) BY MOUTH TWICE DAILY WITH A MEAL   pramipexole (MIRAPEX) 1.5 MG tablet Take 1 tablet (1.5 mg total) by mouth at bedtime.   Prenatal Multivit-Min-Fe-FA (PRE-NATAL PO)    No facility-administered encounter medications on file as of 02/10/2021.    Patient Active Problem List   Diagnosis Date Noted   Fracture of second toe, left, closed, initial encounter 09/26/2017   GERD (gastroesophageal reflux disease) 10/19/2014   Essential hypertension 08/16/2014   Breast pain, right 05/20/2014   Restless leg syndrome 05/20/2014   Hip pain, bilateral 10/23/2011   Knee pain, bilateral 10/23/2011   EDEMA 01/15/2009   Hypothyroidism 12/11/2006   Diabetes type 2, uncontrolled 12/11/2006   DIVERTICULOSIS, COLON 12/11/2006   HEADACHE 12/11/2006    Conditions to be addressed/monitored:HTN, HLD, and DMII  Care Plan : Diabetes Type 2 (Adult)  Updates made by Dimitri Ped, RN since 02/10/2021 12:00 AM  Problem: Need for diabetes self management   Priority: High     Long-Range Goal: Effective self managment of Type 2 diabetes   Start Date: 10/31/2020  Expected End Date: 07/15/2021  This Visit's Progress: On track  Recent Progress: On track  Priority: High  Note:   Objective:  Lab Results  Component Value Date   HGBA1C 6.5 04/28/2020   Lab Results  Component Value Date   CREATININE 0.86 11/03/2019    CREATININE 0.84 10/29/2018   CREATININE 0.74 06/13/2017   No results found for: EGFR Current Barriers:  Knowledge Deficits related to basic Diabetes pathophysiology and self care/management Knowledge Deficits related to medications used for management of diabetes Unable to independently self manage Type 2 Diabetes States that she has been checking her CBGs daily and they range from 135-151.  Denies any hypoglycemia States she has been trying to eat healthy but she still drinks some soda most days.  States she has been walking more with her church outings and she is going to Psychologist, occupational for [employer] tomorrow.  States she is going to a class at her church once a week now which is also enjoyable to her Case Manager Clinical Goal(s):  patient will demonstrate improved adherence to prescribed treatment plan for diabetes self care/management as evidenced by: adherence to ADA/ carb modified diet adherence to prescribed medication regimen contacting provider for new or worsened symptoms or questions Interventions:  Collaboration with Carlisle Cater, Tommi Rumps, NP regarding development and update of comprehensive plan of care as evidenced by provider attestation and co-signature Inter-disciplinary care team collaboration (see longitudinal plan of care) Provided education to patient about basic DM disease process Reviewed medications with patient and discussed importance of medication adherence Discussed plans with patient for ongoing care management follow up and provided patient with direct contact information for care management team Provided patient with written educational materials related to hypo and hyperglycemia and importance of correct treatment Reviewed scheduled/upcoming provider appointments including: no scheduled primary care provider Advised patient, providing education and rationale, to check cbg weekly or if she is not feeling good and record, calling provider for findings outside  established parameters.   Review of patient status, including review of consultants reports, relevant laboratory and other test results, and medications completed. Reinforced to try to cut back on sugar sweetened drinks and to start by cutting out sodas 2 days a week Reviewed fasting blood sugar goals of 80-130 and less than 180 1 1/2-2 hours after meals Self-Care Activities - Self administers oral medications as prescribed Attends all scheduled provider appointments Checks blood sugars as prescribed and utilize hyper and hypoglycemia protocol as needed Adheres to prescribed ADA/carb modified Patient Goals: - check blood sugar at prescribed times - check blood sugar if I feel it is too high or too low - take the blood sugar log to all doctor visits - take the blood sugar meter to all doctor visits - change to whole grain breads, cereal, pasta - drink 6 to 8 glasses of water each day - fill half of plate with vegetables - limit fast food meals to no more than 1 per week - read food labels for fat, fiber, carbohydrates and portion size - switch to sugar-free drinks - schedule appointment with eye doctor - check feet daily for cuts, sores or redness - keep feet up while sitting - trim toenails straight across - wash and dry feet carefully every day - wear comfortable, cotton socks - wear comfortable, well-fitting shoes Follow Up  Plan: Telephone follow up appointment with care management team member scheduled for: 03/27/21 at 1 PM The patient has been provided with contact information for the care management team and has been advised to call with any health related questions or concerns.      Care Plan : Cardiovascular disease( HTN, HLD)  Updates made by Dimitri Ped, RN since 02/10/2021 12:00 AM     Problem: Lack of self management plan for Cardiovascular disease(HTN, HLD)   Priority: Medium     Long-Range Goal: Effective self management of  Cardiovascular disease(HTN, HLD)    Start Date: 10/31/2020  Expected End Date: 03/16/2021  This Visit's Progress: On track  Recent Progress: On track  Priority: Medium  Note:   Objective:  Last practice recorded BP readings:  BP Readings from Last 3 Encounters:  04/28/20 128/74  11/03/19 136/84  09/30/19 138/78  Lipid Panel     Component Value Date/Time   CHOL 142 11/03/2019 0810   TRIG 193 (H) 11/03/2019 0810   HDL 49 (L) 11/03/2019 0810   CHOLHDL 2.9 11/03/2019 0810   VLDL 74.0 (H) 10/29/2018 1048   LDLCALC 67 11/03/2019 0810   LDLDIRECT 68.0 10/29/2018 1048    Most recent eGFR/CrCl: No results found for: EGFR  No components found for: CRCL Current Barriers:  Knowledge Deficits related to basic understanding of hypertension and HLD pathophysiology and self care management Knowledge Deficits related to understanding of medications prescribed for management of hypertension and HLD Unable to independently self manage  Cardiovascular disease(HTN, HLD) with x of DM2, hypothyroidism and restless leg syndrome States her B/P monitor stopped working and did not work after she changed the batteries.  States the last day she checked was 02/02/21 and her reading was 163/79  States she sometimes has  salty fries.  States she has had some fast food if they are busy. State their financial  issues have improved at this time Case Manager Clinical Goal(s):  patient will verbalize understanding of plan for hypertension and HLD management patient will attend all scheduled medical appointments: no schedule primary care provider patient will demonstrate improved adherence to prescribed treatment plan for hypertension as evidenced by taking all medications as prescribed, monitoring and recording blood pressure as directed, adhering to low sodium/DASH diet patient will demonstrate improved health management independence as evidenced by checking blood pressure as directed and notifying PCP if SBP>160 or DBP > 90, taking all medications as  prescribe, and adhering to a low sodium diet as discussed. Interventions:  Collaboration with [name], NP regarding development and update of comprehensive plan of care as evidenced by provider attestation and co-signature Inter-disciplinary care team collaboration (see longitudinal plan of care) Evaluation of current treatment plan related to hypertension self management and patient's adherence to plan as established by provider. Reinforced stroke prevention, s/s of heart attack and stroke, DASH diet, complications of uncontrolled blood pressure Reviewed medications with patient and discussed importance of compliance Discussed plans with patient for ongoing care management follow up and provided patient with direct contact information for care management team Advised patient, providing education and rationale, to monitor blood pressure daily and record, calling PCP for findings outside established parameters.  Reviewed scheduled/upcoming provider appointments including:  no primary care provider scheduled-instructed to call to schedule appointment Reviewed importance of stress management to help with HTN control-declines CCM LCSW referral for stress  Reinforced  to avoid saturated fats, trans-fats and eat more fiber Reviewed to take statin as ordered Reinforced to lower fatty  foods, red meat, cheese, milk and increase fiber like whole grains and veggies.  Reviewed to call provider if she had redness, increased  or tenderness in her leg Reviewed to wear compression hose when traveling and to stop frequently to walk around Reviewed to try to avoid higher sodium foods when traveling Arranged to send new B/P monitor to pt  Self-Care Activities: - Self administers medications as prescribed Attends all scheduled provider appointments Calls provider office for new concerns, questions, or BP outside discussed parameters Checks BP and records as discussed Follows a low sodium diet/DASH  diet Patient Goals: - check blood pressure weekly - choose a place to take my blood pressure (home, clinic or office, retail store) - write blood pressure results in a log or diary - ask questions to understand - change to whole grain breads, cereal, pasta - eat smaller or less servings of red meat - fill half the plate with nonstarchy vegetables - get blood test (fasting) done 1 week before next visit - increase the amount of fiber in food - read food labels for fat and fiber - switch to low-fat or skim milk Follow Up Plan: Telephone follow up appointment with care management team member scheduled for: 03/27/21 at 1 PM The patient has been provided with contact information for the care management team and has been advised to call with any health related questions or concerns.       Plan:Telephone follow up appointment with care management team member scheduled for:  03/27/21 The patient has been provided with contact information for the care management team and has been advised to call with any health related questions or concerns.  Peter Garter RN, Jackquline [place], CDE Care Management Coordinator [facility] [phone], Mobile [phone]

## 2021-02-13 DIAGNOSIS — E1165 Type 2 diabetes mellitus with hyperglycemia: Secondary | ICD-10-CM | POA: Diagnosis not present

## 2021-02-13 DIAGNOSIS — I10 Essential (primary) hypertension: Secondary | ICD-10-CM | POA: Diagnosis not present

## 2021-02-13 DIAGNOSIS — E782 Mixed hyperlipidemia: Secondary | ICD-10-CM

## 2021-02-23 ENCOUNTER — Other Ambulatory Visit: Payer: Self-pay

## 2021-02-23 DIAGNOSIS — E118 Type 2 diabetes mellitus with unspecified complications: Secondary | ICD-10-CM

## 2021-02-23 DIAGNOSIS — Z23 Encounter for immunization: Secondary | ICD-10-CM | POA: Diagnosis not present

## 2021-02-23 MED ORDER — LANCETS 28G MISC: 0 refills | Status: AC

## 2021-02-23 MED ORDER — BLOOD GLUCOSE MONITOR KIT: PACK | 0 refills | Status: AC

## 2021-02-27 ENCOUNTER — Other Ambulatory Visit: Payer: Self-pay | Admitting: Adult Health

## 2021-02-27 DIAGNOSIS — Z1231 Encounter for screening mammogram for malignant neoplasm of breast: Secondary | ICD-10-CM

## 2021-03-27 ENCOUNTER — Telehealth: Payer: Medicare Other

## 2021-03-30 ENCOUNTER — Ambulatory Visit
Admission: RE | Admit: 2021-03-30 | Discharge: 2021-03-30 | Disposition: A | Discharge status: Home / Self Care | Payer: Medicare Other | Source: Ambulatory Visit

## 2021-03-30 DIAGNOSIS — Z1231 Encounter for screening mammogram for malignant neoplasm of breast: Secondary | ICD-10-CM | POA: Diagnosis not present

## 2021-03-31 ENCOUNTER — Encounter: Payer: Self-pay | Admitting: Adult Health

## 2021-03-31 ENCOUNTER — Ambulatory Visit (INDEPENDENT_AMBULATORY_CARE_PROVIDER_SITE_OTHER): Payer: Medicare Other | Admitting: Adult Health

## 2021-03-31 VITALS — BP 138/80 | HR 66 | Temp 98.0°F | Ht 63.0 in | Wt 179.0 lb

## 2021-03-31 DIAGNOSIS — G8929 Other chronic pain: Secondary | ICD-10-CM

## 2021-03-31 DIAGNOSIS — M25562 Pain in left knee: Secondary | ICD-10-CM

## 2021-03-31 MED ORDER — METHYLPREDNISOLONE ACETATE 80 MG/ML IJ SUSP
80.0000 mg | Freq: Once | INTRAMUSCULAR | Status: AC
  Administered 2021-03-31: 80 mg via INTRA_ARTICULAR

## 2021-03-31 NOTE — Progress Notes (Signed)
Subjective:    Patient ID: [name], female    DOB: 1950/05/17, 70 y.o.   MRN: [id number]  HPI 70 year old female who  has a past medical history of Allergy, Anemia, Anxiety, ANXIETY DEPRESSION (09/15/2007), Arthritis, ASTHMA (05/15/2009), Asthma, DIABETES MELLITUS, TYPE II (12/11/2006), DIVERTICULOSIS, COLON (12/11/2006), Edema (01/15/2009), Gout, Headache(784.0) (12/11/2006), Hyperlipidemia, HYPERTENSION NEC (08/14/2007), HYPOTHYROIDISM (12/11/2006), PELVIC PAIN, CHRONIC (08/14/2007), and Restless leg syndrome.  She presents to the office today for steroid injection for chronic left knee pain.  Her last knee injection was in June 2021 and has done well since that time up until about a month ago when she started to develop pain throughout her left knee worse on the lateral aspect.  Denies trauma or aggravating injury.  Has not noticed any redness, warmth, or swelling   Review of Systems See HPI   Past Medical History:  Diagnosis Date   Allergy    Anemia    Anxiety    ANXIETY DEPRESSION 09/15/2007   Arthritis    ASTHMA 05/15/2009   Asthma    DIABETES MELLITUS, TYPE II 12/11/2006   DIVERTICULOSIS, COLON 12/11/2006   Edema 01/15/2009   Gout    pt denies; toe was broken   Headache(784.0) 12/11/2006   Hyperlipidemia    HYPERTENSION NEC 08/14/2007   HYPOTHYROIDISM 12/11/2006   PELVIC PAIN, CHRONIC 08/14/2007   Restless leg syndrome     Social History   Socioeconomic History   Marital status: Married    Spouse name: Not on file   Number of children: Not on file   Years of education: Not on file   Highest education level: 12th grade  Occupational History   Not on file  Tobacco Use   Smoking status: Never   Smokeless tobacco: Never  Substance and Sexual Activity   Alcohol use: Yes    Alcohol/week: 0.0 standard drinks    Comment: very rare   Drug use: No   Sexual activity: Not on file  Other Topics Concern   Not on file  Social History Narrative   Not on file   Social  Determinants of Health   Financial Resource Strain: Medium Risk   Difficulty of Paying Living Expenses: Somewhat hard  Food Insecurity: Food Insecurity Present   Worried About Running Out of Food in the Last Year: Sometimes true   Ran Out of Food in the Last Year: Sometimes true  Transportation Needs: No Transportation Needs   Lack of Transportation (Medical): No   Lack of Transportation (Non-Medical): No  Physical Activity: Insufficiently Active   Days of Exercise per Week: 2 days   Minutes of Exercise per Session: 10 min  Stress: No Stress Concern Present   Feeling of Stress : Only a little  Social Connections: Engineer, building services of Communication with Friends and Family: More than three times a week   Frequency of Social Gatherings with Friends and Family: More than three times a week   Attends Religious Services: More than 4 times per year   Active Member of [employer] or Organizations: Yes   Attends Music therapist: More than 4 times per year   Marital Status: Married  Human resources officer Violence: Not At Risk   Fear of Current or Ex-Partner: No   Emotionally Abused: No   Physically Abused: No   Sexually Abused: No    Past Surgical History:  Procedure Laterality Date   ABDOMINAL HYSTERECTOMY     APPENDECTOMY     Bladder  Suspension      Family History  Problem Relation Age of Onset   COPD Mother        smoker   Diabetes Mother    COPD Father        smoker   Diabetes Father    Diabetes Sister    Diabetes Mellitus II Sister    Diabetes Sister    Diabetes Sister    Diabetes Sister    Breast cancer Other 68   Lung cancer Nephew 60   Colon cancer Neg Hx    Esophageal cancer Neg Hx    Pancreatic cancer Neg Hx     Allergies  Allergen Reactions   Benadryl [Diphenhydramine Hcl] Anaphylaxis   Ace Inhibitors Cough   Amoxicillin    Aspirin    Chlorphen-Phenyleph-Methscop     REACTION: rash   Codeine    Diphenhydramine Hcl     REACTION:  swelling-mouth   Fish Allergy    Hydrocodone    Indomethacin    Pentazocine Lactate     REACTION: passing out   Promethazine Hcl    Quinine     REACTION: itching redface    Current Outpatient Medications on File Prior to Visit  Medication Sig Dispense Refill   acetaminophen (TYLENOL) 500 MG tablet Take 1,000 mg by mouth every 8 (eight) hours as needed.     aspirin 81 MG tablet Take 81 mg by mouth at bedtime.     atorvastatin (LIPITOR) 10 MG tablet Take 1 tablet (10 mg total) by mouth daily. 90 tablet 3   blood glucose meter kit and supplies KIT Dispense based on patient and insurance preference. Use up to four times daily as directed. 1 each 0   cholecalciferol (VITAMIN D3) 25 MCG (1000 UT) tablet Take 1,000 Units by mouth daily.     citalopram (CELEXA) 20 MG tablet TAKE 1 TABLET(20 MG) BY MOUTH DAILY 90 tablet 1   Cyanocobalamin (VITAMIN B-12 PO) Take 1 tablet by mouth daily.     glucose blood test strip Use to check blood glucose TID 200 each 0   Lancets 28G MISC Use to check blood glucose 4 times daily 100 each 0   levothyroxine (SYNTHROID) 88 MCG tablet TAKE 1 TABLET(88 MCG) BY MOUTH DAILY 90 tablet 3   losartan-hydrochlorothiazide (HYZAAR) 100-25 MG tablet TAKE 1 TABLET BY MOUTH DAILY 90 tablet 1   metFORMIN (GLUCOPHAGE) 500 MG tablet TAKE 1 TABLET(500 MG) BY MOUTH TWICE DAILY WITH A MEAL 180 tablet 0   pramipexole (MIRAPEX) 1.5 MG tablet Take 1 tablet (1.5 mg total) by mouth at bedtime. 90 tablet 1   Prenatal Multivit-Min-Fe-FA (PRE-NATAL PO)      fluconazole (DIFLUCAN) 150 MG tablet Take 150 mg by mouth once.     No current facility-administered medications on file prior to visit.    BP 138/80    Pulse 66    Temp 98 F (36.7 C) (Oral)    Ht _0  (1.6 m)    Wt 179 lb (81.2 kg)    SpO2 99%    BMI 31.71 kg/m       Objective:   Physical Exam Vitals and nursing note reviewed.  Constitutional:      Appearance: Normal appearance.  Musculoskeletal:        General:  Tenderness present.     Left knee: Bony tenderness present. No swelling, erythema or crepitus. Normal range of motion. Normal alignment, normal meniscus and normal patellar mobility.  Skin:    General:  Skin is warm.  Neurological:     General: No focal deficit present.     Mental Status: She is alert and oriented to person, place, and time.  Psychiatric:        Mood and Affect: Mood normal.        Behavior: Behavior normal.        Thought Content: Thought content normal.        Judgment: Judgment normal.      Assessment & Plan:   1. Chronic pain of left knee Discussed risks and benefits of corticosteroid injection and patient consented.  After prepping skin with betadine, injected 80 mg depomedrol and 2 cc of plain xylocaine with 22 gauge one and one half inch needle using anterolateral approach and pt tolerated well.  - methylPREDNISolone acetate (DEPO-MEDROL) injection 80 mg -Follow up instructions reviewed   [name], NP

## 2021-04-02 ENCOUNTER — Other Ambulatory Visit: Payer: Self-pay | Admitting: Adult Health

## 2021-04-02 DIAGNOSIS — Z760 Encounter for issue of repeat prescription: Secondary | ICD-10-CM

## 2021-04-02 DIAGNOSIS — E118 Type 2 diabetes mellitus with unspecified complications: Secondary | ICD-10-CM

## 2021-04-11 ENCOUNTER — Ambulatory Visit (INDEPENDENT_AMBULATORY_CARE_PROVIDER_SITE_OTHER): Payer: Medicare Other

## 2021-04-11 DIAGNOSIS — E782 Mixed hyperlipidemia: Secondary | ICD-10-CM

## 2021-04-11 DIAGNOSIS — E118 Type 2 diabetes mellitus with unspecified complications: Secondary | ICD-10-CM

## 2021-04-11 DIAGNOSIS — I10 Essential (primary) hypertension: Secondary | ICD-10-CM

## 2021-04-11 NOTE — Chronic Care Management (AMB) (Signed)
Chronic Care Management   CCM RN Visit Note  04/11/2021 Name: [name] MRN: [id number] DOB: [birth year]  Subjective: [name] is a 70 y.o. year old female who is a primary care patient of [name], NP. The care management team was consulted for assistance with disease management and care coordination needs.    Engaged with patient by telephone for follow up visit in response to provider referral for case management and/or care coordination services.   Consent to Services:  The patient was given information about Chronic Care Management services, agreed to services, and gave verbal consent prior to initiation of services.  Please see initial visit note for detailed documentation.   Patient agreed to services and verbal consent obtained.   Assessment: Review of patient past medical history, allergies, medications, health status, including review of consultants reports, laboratory and other test data, was performed as part of comprehensive evaluation and provision of chronic care management services.   SDOH (Social Determinants of Health) assessments and interventions performed:    CCM Care Plan  Allergies  Allergen Reactions   Benadryl [Diphenhydramine Hcl] Anaphylaxis   Ace Inhibitors Cough   Amoxicillin    Aspirin    Chlorphen-Phenyleph-Methscop     REACTION: rash   Codeine    Diphenhydramine Hcl     REACTION: swelling-mouth   Fish Allergy    Hydrocodone    Indomethacin    Pentazocine Lactate     REACTION: passing out   Promethazine Hcl    Quinine     REACTION: itching redface    Outpatient Encounter Medications as of 04/11/2021  Medication Sig   acetaminophen (TYLENOL) 500 MG tablet Take 1,000 mg by mouth every 8 (eight) hours as needed.   aspirin 81 MG tablet Take 81 mg by mouth at bedtime.   atorvastatin (LIPITOR) 10 MG tablet Take 1 tablet (10 mg total) by mouth daily.   blood glucose meter kit and supplies KIT Dispense based  on patient and insurance preference. Use up to four times daily as directed.   cholecalciferol (VITAMIN D3) 25 MCG (1000 UT) tablet Take 1,000 Units by mouth daily.   citalopram (CELEXA) 20 MG tablet TAKE 1 TABLET(20 MG) BY MOUTH DAILY   Cyanocobalamin (VITAMIN B-12 PO) Take 1 tablet by mouth daily.   fluconazole (DIFLUCAN) 150 MG tablet Take 150 mg by mouth once.   glucose blood test strip Use to check blood glucose TID   Lancets 28G MISC Use to check blood glucose 4 times daily   levothyroxine (SYNTHROID) 88 MCG tablet TAKE 1 TABLET(88 MCG) BY MOUTH DAILY   losartan-hydrochlorothiazide (HYZAAR) 100-25 MG tablet TAKE 1 TABLET BY MOUTH DAILY   metFORMIN (GLUCOPHAGE) 500 MG tablet TAKE 1 TABLET(500 MG) BY MOUTH TWICE DAILY WITH A MEAL   pramipexole (MIRAPEX) 1.5 MG tablet Take 1 tablet (1.5 mg total) by mouth at bedtime.   Prenatal Multivit-Min-Fe-FA (PRE-NATAL PO)    No facility-administered encounter medications on file as of 04/11/2021.    Patient Active Problem List   Diagnosis Date Noted   Fracture of second toe, left, closed, initial encounter 09/26/2017   GERD (gastroesophageal reflux disease) 10/19/2014   Essential hypertension 08/16/2014   Breast pain, right 05/20/2014   Restless leg syndrome 05/20/2014   Hip pain, bilateral 10/23/2011   Knee pain, bilateral 10/23/2011   EDEMA 01/15/2009   Hypothyroidism 12/11/2006   Diabetes type 2, uncontrolled 12/11/2006   DIVERTICULOSIS, COLON 12/11/2006   HEADACHE 12/11/2006    Conditions to be  addressed/monitored:HTN, HLD, and DMII  Care Plan : Diabetes Type 2 (Adult)  Updates made by Dimitri Ped, RN since 04/11/2021 12:00 AM  Completed 04/11/2021   Problem: Need for diabetes self management Resolved 04/11/2021  Priority: High     Long-Range Goal: Effective self managment of Type 2 diabetes Completed 04/11/2021  Start Date: 10/31/2020  Expected End Date: 07/15/2021  Recent Progress: On track  Priority: High  Note:    Resolving due to duplicate goal  Objective:  Lab Results  Component Value Date   HGBA1C 6.5 04/28/2020   Lab Results  Component Value Date   CREATININE 0.86 11/03/2019   CREATININE 0.84 10/29/2018   CREATININE 0.74 06/13/2017   No results found for: EGFR Current Barriers:  Knowledge Deficits related to basic Diabetes pathophysiology and self care/management Knowledge Deficits related to medications used for management of diabetes Unable to independently self manage Type 2 Diabetes States that she has been checking her CBGs daily and they range from 135-151.  Denies any hypoglycemia States she has been trying to eat healthy but she still drinks some soda most days.  States she has been walking more with her church outings and she is going to Psychologist, occupational for [employer] tomorrow.  States she is going to a class at her church once a week now which is also enjoyable to her Case Manager Clinical Goal(s):  patient will demonstrate improved adherence to prescribed treatment plan for diabetes self care/management as evidenced by: adherence to ADA/ carb modified diet adherence to prescribed medication regimen contacting provider for new or worsened symptoms or questions Interventions:  Collaboration with Carlisle Cater, Tommi Rumps, NP regarding development and update of comprehensive plan of care as evidenced by provider attestation and co-signature Inter-disciplinary care team collaboration (see longitudinal plan of care) Provided education to patient about basic DM disease process Reviewed medications with patient and discussed importance of medication adherence Discussed plans with patient for ongoing care management follow up and provided patient with direct contact information for care management team Provided patient with written educational materials related to hypo and hyperglycemia and importance of correct treatment Reviewed scheduled/upcoming provider appointments including: no scheduled  primary care provider Advised patient, providing education and rationale, to check cbg weekly or if she is not feeling good and record, calling provider for findings outside established parameters.   Review of patient status, including review of consultants reports, relevant laboratory and other test results, and medications completed. Reinforced to try to cut back on sugar sweetened drinks and to start by cutting out sodas 2 days a week Reviewed fasting blood sugar goals of 80-130 and less than 180 1 1/2-2 hours after meals Self-Care Activities - Self administers oral medications as prescribed Attends all scheduled provider appointments Checks blood sugars as prescribed and utilize hyper and hypoglycemia protocol as needed Adheres to prescribed ADA/carb modified Patient Goals: - check blood sugar at prescribed times - check blood sugar if I feel it is too high or too low - take the blood sugar log to all doctor visits - take the blood sugar meter to all doctor visits - change to whole grain breads, cereal, pasta - drink 6 to 8 glasses of water each day - fill half of plate with vegetables - limit fast food meals to no more than 1 per week - read food labels for fat, fiber, carbohydrates and portion size - switch to sugar-free drinks - schedule appointment with eye doctor - check feet daily for cuts, sores or redness -  keep feet up while sitting - trim toenails straight across - wash and dry feet carefully every day - wear comfortable, cotton socks - wear comfortable, well-fitting shoes Follow Up Plan: Telephone follow up appointment with care management team member scheduled for: 03/27/21 at 1 PM The patient has been provided with contact information for the care management team and has been advised to call with any health related questions or concerns.      Care Plan : Cardiovascular disease( HTN, HLD)  Updates made by Dimitri Ped, RN since 04/11/2021 12:00 AM  Completed  04/11/2021   Problem: Lack of self management plan for Cardiovascular disease(HTN, HLD) Resolved 04/11/2021  Priority: Medium     Long-Range Goal: Effective self management of  Cardiovascular disease(HTN, HLD) Completed 04/11/2021  Start Date: 10/31/2020  Expected End Date: 03/16/2021  Recent Progress: On track  Priority: Medium  Note:   Resolving due to duplicate goal  Objective:  Last practice recorded BP readings:  BP Readings from Last 3 Encounters:  04/28/20 128/74  11/03/19 136/84  09/30/19 138/78  Lipid Panel     Component Value Date/Time   CHOL 142 11/03/2019 0810   TRIG 193 (H) 11/03/2019 0810   HDL 49 (L) 11/03/2019 0810   CHOLHDL 2.9 11/03/2019 0810   VLDL 74.0 (H) 10/29/2018 1048   LDLCALC 67 11/03/2019 0810   LDLDIRECT 68.0 10/29/2018 1048    Most recent eGFR/CrCl: No results found for: EGFR  No components found for: CRCL Current Barriers:  Knowledge Deficits related to basic understanding of hypertension and HLD pathophysiology and self care management Knowledge Deficits related to understanding of medications prescribed for management of hypertension and HLD Unable to independently self manage  Cardiovascular disease(HTN, HLD) with x of DM2, hypothyroidism and restless leg syndrome States her B/P monitor stopped working and did not work after she changed the batteries.  States the last day she checked was 02/02/21 and her reading was 163/79  States she sometimes has  salty fries.  States she has had some fast food if they are busy. State their financial  issues have improved at this time Case Manager Clinical Goal(s):  patient will verbalize understanding of plan for hypertension and HLD management patient will attend all scheduled medical appointments: no schedule primary care provider patient will demonstrate improved adherence to prescribed treatment plan for hypertension as evidenced by taking all medications as prescribed, monitoring and recording blood  pressure as directed, adhering to low sodium/DASH diet patient will demonstrate improved health management independence as evidenced by checking blood pressure as directed and notifying PCP if SBP>160 or DBP > 90, taking all medications as prescribe, and adhering to a low sodium diet as discussed. Interventions:  Collaboration with [name], NP regarding development and update of comprehensive plan of care as evidenced by provider attestation and co-signature Inter-disciplinary care team collaboration (see longitudinal plan of care) Evaluation of current treatment plan related to hypertension self management and patient's adherence to plan as established by provider. Reinforced stroke prevention, s/s of heart attack and stroke, DASH diet, complications of uncontrolled blood pressure Reviewed medications with patient and discussed importance of compliance Discussed plans with patient for ongoing care management follow up and provided patient with direct contact information for care management team Advised patient, providing education and rationale, to monitor blood pressure daily and record, calling PCP for findings outside established parameters.  Reviewed scheduled/upcoming provider appointments including:  no primary care provider scheduled-instructed to call to schedule appointment Reviewed importance of stress  management to help with HTN control-declines CCM LCSW referral for stress  Reinforced  to avoid saturated fats, trans-fats and eat more fiber Reviewed to take statin as ordered Reinforced to lower fatty foods, red meat, cheese, milk and increase fiber like whole grains and veggies.  Reviewed to call provider if she had redness, increased  or tenderness in her leg Reviewed to wear compression hose when traveling and to stop frequently to walk around Reviewed to try to avoid higher sodium foods when traveling Arranged to send new B/P monitor to pt  Self-Care Activities: - Self  administers medications as prescribed Attends all scheduled provider appointments Calls provider office for new concerns, questions, or BP outside discussed parameters Checks BP and records as discussed Follows a low sodium diet/DASH diet Patient Goals: - check blood pressure weekly - choose a place to take my blood pressure (home, clinic or office, retail store) - write blood pressure results in a log or diary - ask questions to understand - change to whole grain breads, cereal, pasta - eat smaller or less servings of red meat - fill half the plate with nonstarchy vegetables - get blood test (fasting) done 1 week before next visit - increase the amount of fiber in food - read food labels for fat and fiber - switch to low-fat or skim milk Follow Up Plan: Telephone follow up appointment with care management team member scheduled for: 03/27/21 at 1 PM The patient has been provided with contact information for the care management team and has been advised to call with any health related questions or concerns.      Care Plan : RN Care Manager Plan of Care  Updates made by Dimitri Ped, RN since 04/11/2021 12:00 AM     Problem: Chronic Disease Management and Care Coordination Needs (DM,HTN and HLD)   Priority: High     Long-Range Goal: Establish Plan of Care for Chronic Disease Management Needs (DM,HTN and HLD)   Start Date: 04/11/2021  Expected End Date: 04/08/2022  Priority: High  Note:   Current Barriers:  Chronic Disease Management support and education needs related to HTN, HLD, and DMII  States she has been having more stress recently due to her husbands bladder cancer has returned and he is having surgery tomorrow.  States her CBGs have been higher with ranges of 155-162.  States she did get the new B/P cuff and her readings have ranged from 144-172/71-85.  States she does not have any knee pain since she got the injection a few weeks ago.  States she has not been eating  as much recently.  States she is coping with her stress at this time and she has church friends praying for them.  RNCM Clinical Goal(s):  Patient will verbalize understanding of plan for management of HTN, HLD, and DMII as evidenced by voiced adherence to plan of care verbalize basic understanding of  HTN, HLD, and DMII disease process and self health management plan as evidenced by voiced understanding and teach back take all medications exactly as prescribed and will call provider for medication related questions as evidenced by dispense report and pt verbalization attend all scheduled medical appointments: [name] NP 05/09/21 as evidenced by medical record demonstrate Improved adherence to prescribed treatment plan for HTN, HLD, and DMII as evidenced by readings within limits, voiced adherence to plan of care continue to work with RN Care Manager to address care management and care coordination needs related to  HTN, HLD, and DMII  as evidenced by adherence to CM Team Scheduled appointments through collaboration with RN Care manager, provider, and care team.   Interventions: 1:1 collaboration with primary care provider regarding development and update of comprehensive plan of care as evidenced by provider attestation and co-signature Inter-disciplinary care team collaboration (see longitudinal plan of care) Evaluation of current treatment plan related to  self management and patient's adherence to plan as established by provider   Diabetes Interventions:  (Status:  Goal on track:  Yes.) Long Term Goal Assessed patient's understanding of A1c goal: <7% Provided education to patient about basic DM disease process Reviewed medications with patient and discussed importance of medication adherence Counseled on importance of regular laboratory monitoring as prescribed Discussed plans with patient for ongoing care management follow up and provided patient with direct contact information for care  management team Advised patient, providing education and rationale, to check cbg daily and record, calling provider for findings outside established parameters Reviewed how stress and steroid injections can make her CBGs range higher.  Reviewed to try to eat a healthy well balanced diet when she is stressed and to get adequate sleep Lab Results  Component Value Date   HGBA1C 6.8 (A) 01/05/2021   Hyperlipidemia Interventions:  (Status:  Goal on track:  Yes.) Long Term Goal Medication review performed; medication list updated in electronic medical record.  Provider established cholesterol goals reviewed Counseled on importance of regular laboratory monitoring as prescribed Reviewed role and benefits of statin for ASCVD risk reduction  Hypertension Interventions:  (Status:  Goal on track:  Yes.) Long Term Goal Last practice recorded BP readings:  BP Readings from Last 3 Encounters:  03/31/21 138/80  01/05/21 140/86  01/02/21 132/77  Most recent eGFR/CrCl: No results found for: EGFR  No components found for: CRCL  Evaluation of current treatment plan related to hypertension self management and patient's adherence to plan as established by provider Provided education to patient re: stroke prevention, s/s of heart attack and stroke Reviewed medications with patient and discussed importance of compliance Discussed plans with patient for ongoing care management follow up and provided patient with direct contact information for care management team Provided education on prescribed diet low sodium low CHO Reviewed importance of controlling stress to help with B/P. Declines LCSW at this time   Patient Goals/Self-Care Activities: Take all medications as prescribed Attend all scheduled provider appointments Call pharmacy for medication refills 3-7 days in advance of running out of medications Attend church or other social activities Perform all self care activities independently  Call provider  office for new concerns or questions  keep appointment with eye doctor check blood sugar at prescribed times: once daily and when you have symptoms of low or high blood sugar check feet daily for cuts, sores or redness take the blood sugar log to all doctor visits drink 6 to 8 glasses of water each day fill half of plate with vegetables manage portion size keep feet up while sitting check blood pressure 3 times per week choose a place to take my blood pressure (home, clinic or office, retail store) take blood pressure log to all doctor appointments eat more whole grains, fruits and vegetables, lean meats and healthy fats limit salt intake to 2346m/day call for medicine refill 2 or 3 days before it runs out take all medications exactly as prescribed call doctor with any symptoms you believe are related to your medicine  Follow Up Plan:  Telephone follow up appointment with care management team member scheduled  for:  06/08/21 The patient has been provided with contact information for the care management team and has been advised to call with any health related questions or concerns.       Plan:Telephone follow up appointment with care management team member scheduled for:  06/08/21 The patient has been provided with contact information for the care management team and has been advised to call with any health related questions or concerns.  Peter Garter RN, Jackquline Denmark, CDE Care Management Coordinator [facility] [phone], Mobile [phone]

## 2021-04-11 NOTE — Patient Instructions (Addendum)
Visit Information  Thank you for taking time to visit with me today. Please don't hesitate to contact me if I can be of assistance to you before our next scheduled telephone appointment.  Following are the goals we discussed today:  Take all medications as prescribed Attend all scheduled provider appointments Call pharmacy for medication refills 3-7 days in advance of running out of medications Attend church or other social activities Perform all self care activities independently  Call provider office for new concerns or questions  keep appointment with eye doctor check blood sugar at prescribed times: once daily and when you have symptoms of low or high blood sugar check feet daily for cuts, sores or redness take the blood sugar log to all doctor visits drink 6 to 8 glasses of water each day fill half of plate with vegetables manage portion size keep feet up while sitting check blood pressure 3 times per week choose a place to take my blood pressure (home, clinic or office, retail store) take blood pressure log to all doctor appointments eat more whole grains, fruits and vegetables, lean meats and healthy fats limit salt intake to 2333m/day call for medicine refill 2 or 3 days before it runs out take all medications exactly as prescribed call doctor with any symptoms you believe are related to your medicine Managing Stress, Adult Feeling a certain amount of stress is normal. Stress helps our body and mind get ready to deal with the demands of life. Stress hormones can motivate you to do well at work and meet your responsibilities. But severe or long-term (chronic) stress can affect your mental and physical health. Chronic stress puts you at higher risk for: Anxiety and depression. Other health problems such as digestive problems, muscle aches, heart disease, high blood pressure, and stroke. What are the causes? Common causes of stress include: Demands from work, such as deadlines,  feeling overworked, or having long hours. Pressures at home, such as money issues, disagreements with a spouse, or parenting issues. Pressures from major life changes, such as divorce, moving, loss of a loved one, or chronic illness. You may be at higher risk for stress-related problems if you: Do not get enough sleep. Are in poor health. Do not have emotional support. Have a mental health disorder such as anxiety or depression. How to recognize stress Stress can make you: Have trouble sleeping. Feel sad, anxious, irritable, or overwhelmed. Lose your appetite. Overeat or want to eat unhealthy foods. Want to use drugs or alcohol. Stress can also cause physical symptoms, such as: Sore, tense muscles, especially in the shoulders and neck. Headaches. Trouble breathing. A faster heart rate. Stomach pain, nausea, or vomiting. Diarrhea or constipation. Trouble concentrating. Follow these instructions at home: Eating and drinking Eat a healthy diet. This includes: Eating foods that are high in fiber, such as beans, whole grains, and fresh fruits and vegetables. Limiting foods that are high in fat and processed sugars, such as fried or sweet foods. Do not skip meals or overeat. Drink enough fluid to keep your urine pale yellow. Alcohol use Do not drink alcohol if: Your health care provider tells you not to drink. You are pregnant, may be pregnant, or are planning to become pregnant. Drinking alcohol is a way some people try to ease their stress. This can be dangerous, so if you drink alcohol: Limit how much you have to: 0-1 drink a day for women. 0-2 drinks a day for men. Know how much alcohol is in your drink.  In the [place] one drink equals one 12 oz bottle of beer (355 mL), one 5 oz glass of wine (148 mL), or one 1 oz glass of hard liquor (44 mL). Activity  Include 30 minutes of exercise in your daily schedule. Exercise is a good stress reducer. Include time in your day for an  activity that you find relaxing. Try taking a walk, going on a bike ride, reading a book, or listening to music. Schedule your time in a way that lowers stress, and keep a regular schedule. Focus on doing what is most important to get done. Lifestyle Identify the source of your stress and your reaction to it. See a therapist who can help you change unhelpful reactions. When there are stressful events: Talk about them with family, friends, or coworkers. Try to think realistically about stressful events and not ignore them or overreact. Try to find the positives in a stressful situation and not focus on the negatives. Cut back on responsibilities at work and home, if possible. Ask for help from friends or family members if you need it. Find ways to manage stress, such as: Mindfulness, meditation, or deep breathing. Yoga or tai chi. Progressive muscle relaxation. Spending time in nature. Doing art, playing music, or reading. Making time for fun activities. Spending time with family and friends. Get support from family, friends, or spiritual resources. General instructions Get enough sleep. Try to go to sleep and get up at about the same time every day. Take over-the-counter and prescription medicines only as told by your health care provider. Do not use any products that contain nicotine or tobacco. These products include cigarettes, chewing tobacco, and vaping devices, such as e-cigarettes. If you need help quitting, ask your health care provider. Do not use drugs or smoke to deal with stress. Keep all follow-up visits. This is important. Where to find support Talk with your health care provider about stress management or finding a support group. Find a therapist to work with you on your stress management techniques. Where to find more information [employer] on Mental Illness: www.nami.org American Psychological Association: TVStereos.ch Contact a health care provider if: Your  stress symptoms get worse. You are unable to manage your stress at home. You are struggling to stop using drugs or alcohol. Get help right away if: You may be a danger to yourself or others. You have any thoughts of death or suicide. Get help right awayif you feel like you may hurt yourself or others, or have thoughts about taking your own life. Go to your nearest emergency room or: Call 911. Call the [place] at 418-237-2681 or 988 in the [place] This is open 24 hours a day. Text the Crisis Text Line at 3155326978. Summary Feeling a certain amount of stress is normal, but severe or long-term (chronic) stress can affect your mental and physical health. Chronic stress can put you at higher risk for anxiety, depression, and other health problems such as digestive problems, muscle aches, heart disease, high blood pressure, and stroke. You may be at higher risk for stress-related problems if you do not get enough sleep, are in poor health, lack emotional support, or have a mental health disorder such as anxiety or depression. Identify the source of your stress and your reaction to it. Try talking about stressful events with family, friends, or coworkers, finding a coping method, or getting support from spiritual resources. If you need more help, talk with your health care provider about finding a  support group or a mental health therapist. This information is not intended to replace advice given to you by your health care provider. Make sure you discuss any questions you have with your health care provider. Document Revised: 10/27/2020 Document Reviewed: 10/25/2020 Elsevier Patient Education  2022 [place] next appointment is by telephone on 06/08/21 at 11:30 AM  Please call the care guide team at [phone] if you need to cancel or reschedule your appointment.   If you are experiencing a Mental Health or [place] or need someone to talk to,  please call the Suicide and Crisis Lifeline: 988 call the [place] National Suicide Prevention Lifeline: (631)674-4172 or TTY: 928 083 6103 TTY 312-332-3931) to talk to a trained counselor call 1-800-273-TALK (toll free, 24 hour hotline) call 911   Patient verbalizes understanding of instructions provided today and agrees to view in [place].  Peter Garter RN, Jackquline Denmark, CDE Care Management Coordinator [facility] [phone], Mobile [phone]

## 2021-04-15 DIAGNOSIS — Z7984 Long term (current) use of oral hypoglycemic drugs: Secondary | ICD-10-CM

## 2021-04-15 DIAGNOSIS — E1159 Type 2 diabetes mellitus with other circulatory complications: Secondary | ICD-10-CM

## 2021-04-15 DIAGNOSIS — I10 Essential (primary) hypertension: Secondary | ICD-10-CM | POA: Diagnosis not present

## 2021-04-15 DIAGNOSIS — E785 Hyperlipidemia, unspecified: Secondary | ICD-10-CM | POA: Diagnosis not present

## 2021-04-26 ENCOUNTER — Encounter: Payer: Self-pay | Admitting: Adult Health

## 2021-04-26 DIAGNOSIS — H25813 Combined forms of age-related cataract, bilateral: Secondary | ICD-10-CM | POA: Diagnosis not present

## 2021-04-26 LAB — HM DIABETES EYE EXAM

## 2021-05-01 DIAGNOSIS — R051 Acute cough: Secondary | ICD-10-CM | POA: Diagnosis not present

## 2021-05-01 DIAGNOSIS — I10 Essential (primary) hypertension: Secondary | ICD-10-CM | POA: Diagnosis not present

## 2021-05-01 DIAGNOSIS — E119 Type 2 diabetes mellitus without complications: Secondary | ICD-10-CM | POA: Diagnosis not present

## 2021-05-09 ENCOUNTER — Encounter: Payer: Self-pay | Admitting: Adult Health

## 2021-05-09 ENCOUNTER — Ambulatory Visit (INDEPENDENT_AMBULATORY_CARE_PROVIDER_SITE_OTHER): Payer: Medicare Other | Admitting: Adult Health

## 2021-05-09 ENCOUNTER — Encounter: Payer: Medicare Other | Admitting: Adult Health

## 2021-05-09 VITALS — BP 140/62 | HR 72 | Temp 98.5°F | Ht 63.0 in | Wt 179.8 lb

## 2021-05-09 DIAGNOSIS — E118 Type 2 diabetes mellitus with unspecified complications: Secondary | ICD-10-CM | POA: Diagnosis not present

## 2021-05-09 DIAGNOSIS — E782 Mixed hyperlipidemia: Secondary | ICD-10-CM

## 2021-05-09 DIAGNOSIS — I10 Essential (primary) hypertension: Secondary | ICD-10-CM

## 2021-05-09 DIAGNOSIS — G2581 Restless legs syndrome: Secondary | ICD-10-CM

## 2021-05-09 DIAGNOSIS — F419 Anxiety disorder, unspecified: Secondary | ICD-10-CM

## 2021-05-09 DIAGNOSIS — F32A Depression, unspecified: Secondary | ICD-10-CM

## 2021-05-09 LAB — CBC WITH DIFFERENTIAL/PLATELET
Basophils Absolute: 0 10*3/uL (ref 0.0–0.1)
Basophils Relative: 0.6 % (ref 0.0–3.0)
Eosinophils Absolute: 0.2 10*3/uL (ref 0.0–0.7)
Eosinophils Relative: 3.2 % (ref 0.0–5.0)
HCT: 39.6 % (ref 36.0–46.0)
Hemoglobin: 12.6 g/dL (ref 12.0–15.0)
Lymphocytes Relative: 32.7 % (ref 12.0–46.0)
Lymphs Abs: 2.3 10*3/uL (ref 0.7–4.0)
MCHC: 31.8 g/dL (ref 30.0–36.0)
MCV: 88.5 fl (ref 78.0–100.0)
Monocytes Absolute: 0.5 10*3/uL (ref 0.1–1.0)
Monocytes Relative: 7.1 % (ref 3.0–12.0)
Neutro Abs: 4 10*3/uL (ref 1.4–7.7)
Neutrophils Relative %: 56.4 % (ref 43.0–77.0)
Platelets: 252 10*3/uL (ref 150.0–400.0)
RBC: 4.48 Mil/uL (ref 3.87–5.11)
RDW: 13.9 % (ref 11.5–15.5)
WBC: 7 10*3/uL (ref 4.0–10.5)

## 2021-05-09 LAB — COMPREHENSIVE METABOLIC PANEL
ALT: 14 U/L (ref 0–35)
AST: 15 U/L (ref 0–37)
Albumin: 4 g/dL (ref 3.5–5.2)
Alkaline Phosphatase: 75 U/L (ref 39–117)
BUN: 15 mg/dL (ref 6–23)
CO2: 31 mEq/L (ref 19–32)
Calcium: 9.6 mg/dL (ref 8.4–10.5)
Chloride: 101 mEq/L (ref 96–112)
Creatinine, Ser: 0.89 mg/dL (ref 0.40–1.20)
GFR: 66.4 mL/min (ref >60.00)
Glucose, Bld: 108 mg/dL — ABNORMAL HIGH (ref 70–99)
Potassium: 4.1 mEq/L (ref 3.5–5.1)
Sodium: 140 mEq/L (ref 135–145)
Total Bilirubin: 0.8 mg/dL (ref 0.2–1.2)
Total Protein: 6.7 g/dL (ref 6.0–8.3)

## 2021-05-09 LAB — MICROALBUMIN / CREATININE URINE RATIO
Creatinine,U: 110.6 mg/dL
Microalb Creat Ratio: 0.6 mg/g (ref 0.0–30.0)
Microalb, Ur: <0.7 mg/dL (ref 0.0–1.9)

## 2021-05-09 LAB — LIPID PANEL
Cholesterol: 112 mg/dL (ref 0–200)
HDL: 46.5 mg/dL (ref >39.00)
LDL Cholesterol: 29 mg/dL (ref 0–99)
NonHDL: 65.21
Total CHOL/HDL Ratio: 2
Triglycerides: 180 mg/dL — ABNORMAL HIGH (ref 0.0–149.0)
VLDL: 36 mg/dL (ref 0.0–40.0)

## 2021-05-09 LAB — HEMOGLOBIN A1C: Hgb A1c MFr Bld: 7.8 % — ABNORMAL HIGH (ref 4.6–6.5)

## 2021-05-09 LAB — TSH: TSH: 0.89 u[IU]/mL (ref 0.35–5.50)

## 2021-05-09 NOTE — Patient Instructions (Addendum)
It was great seeing you today   We will follow up with you regarding your lab work   Please let me know if you need anything !  I will see you back in six months

## 2021-05-09 NOTE — Progress Notes (Signed)
° °Subjective:  ° ° Patient ID: [name], female    DOB: 12/10/1950, 70 y.o.   MRN: [id number] ° °HPI °Patient presents for yearly preventative medicine examination. She is a pleasant 70 year old female who  has a past medical history of Allergy, Anemia, Anxiety, ANXIETY DEPRESSION (09/15/2007), Arthritis, ASTHMA (05/15/2009), Asthma, DIABETES MELLITUS, TYPE II (12/11/2006), DIVERTICULOSIS, COLON (12/11/2006), Edema (01/15/2009), Gout, Headache(784.0) (12/11/2006), Hyperlipidemia, HYPERTENSION NEC (08/14/2007), HYPOTHYROIDISM (12/11/2006), PELVIC PAIN, CHRONIC (08/14/2007), and Restless leg syndrome. ° °DM -currently prescribed metformin 500 mg twice daily. She has been monitoring her BS at home with readings in the 100-150.  °Lab Results  °Component Value Date  ° HGBA1C 6.8 (A) 01/05/2021  ° °Hypothyroidism-controlled with Synthroid 88 mcg daily ° °Essential hypertension-takes Hyzaar 100-25 mg daily.  She denies chest pain, shortness of breath, dizziness, lightheadedness, or headaches.  Blood pressures at home have been in the 120s to high 140s over 60s to 80s.  She has been under a lot of stress lately due to her husband's health °BP Readings from Last 3 Encounters:  °05/09/21 140/62  °03/31/21 138/80  °01/05/21 140/86  ° °Restless leg syndrome-takes 1.5 mg of Mirapex nightly.  She does feel well controlled on this medication ° °Anxiety and depression-she has been taking 10 mg of Celexa, cutting her 20 mg tabs in half.  Her husband has recently been diagnosed with advanced bladder cancer, without treatment has 6 to 12 months left.  She is going to increase her Celexa to back to 20 mg to get better control of her emotions. ° °Hyperlipidemia-prescribed lipitor 10 mg daily.  She denies myalgia or fatigue °Lab Results  °Component Value Date  ° CHOL 142 11/03/2019  ° HDL 49 (L) 11/03/2019  ° LDLCALC 67 11/03/2019  ° LDLDIRECT 68.0 10/29/2018  ° TRIG 193 (H) 11/03/2019  ° CHOLHDL 2.9 11/03/2019  ° ° °All  immunizations and health maintenance protocols were reviewed with the patient and needed orders were placed. ° °Appropriate screening laboratory values were ordered for the patient including screening of hyperlipidemia, renal function and hepatic function. ° ° °Medication reconciliation,  past medical history, social history, problem list and allergies were reviewed in detail with the patient ° °Goals were established with regard to weight loss, exercise, and  diet in compliance with medications ° °Wt Readings from Last 3 Encounters:  °05/09/21 179 lb 12.8 oz (81.6 kg)  °03/31/21 179 lb (81.2 kg)  °01/05/21 185 lb (83.9 kg)  ° °Review of Systems  °Constitutional: Negative.   °HENT: Negative.    °Eyes: Negative.   °Respiratory: Negative.    °Cardiovascular: Negative.   °Gastrointestinal: Negative.   °Endocrine: Negative.   °Genitourinary: Negative.   °Musculoskeletal: Negative.   °Skin: Negative.   °Allergic/Immunologic: Negative.   °Neurological: Negative.   °Hematological: Negative.   °Psychiatric/Behavioral:  Positive for dysphoric mood. The patient is nervous/anxious.   ° °Past Medical History:  °Diagnosis Date  ° Allergy   ° Anemia   ° Anxiety   ° ANXIETY DEPRESSION 09/15/2007  ° Arthritis   ° ASTHMA 05/15/2009  ° Asthma   ° DIABETES MELLITUS, TYPE II 12/11/2006  ° DIVERTICULOSIS, COLON 12/11/2006  ° Edema 01/15/2009  ° Gout   ° pt denies; toe was broken  ° Headache(784.0) 12/11/2006  ° Hyperlipidemia   ° HYPERTENSION NEC 08/14/2007  ° HYPOTHYROIDISM 12/11/2006  ° PELVIC PAIN, CHRONIC 08/14/2007  ° Restless leg syndrome   ° ° °Social History  ° °Socioeconomic History  ° Marital status:   Married  °  Spouse name: Not on file  ° Number of children: Not on file  ° Years of education: Not on file  ° Highest education level: 12th grade  °Occupational History  ° Not on file  °Tobacco Use  ° Smoking status: Never  ° Smokeless tobacco: Never  °Substance and Sexual Activity  ° Alcohol use: Yes  °  Alcohol/week: 0.0 standard drinks   °  Comment: very rare  ° Drug use: No  ° Sexual activity: Not on file  °Other Topics Concern  ° Not on file  °Social History Narrative  ° Not on file  ° °Social Determinants of Health  ° °Financial Resource Strain: Medium Risk  ° Difficulty of Paying Living Expenses: Somewhat hard  °Food Insecurity: Food Insecurity Present  ° Worried About Running Out of Food in the Last Year: Sometimes true  ° Ran Out of Food in the Last Year: Sometimes true  °Transportation Needs: No Transportation Needs  ° Lack of Transportation (Medical): No  ° Lack of Transportation (Non-Medical): No  °Physical Activity: Insufficiently Active  ° Days of Exercise per Week: 2 days  ° Minutes of Exercise per Session: 10 min  °Stress: No Stress Concern Present  ° Feeling of Stress : Only a little  °Social Connections: Socially Integrated  ° Frequency of Communication with Friends and Family: More than three times a week  ° Frequency of Social Gatherings with Friends and Family: More than three times a week  ° Attends Religious Services: More than 4 times per year  ° Active Member of Clubs or Organizations: Yes  ° Attends Club or Organization Meetings: More than 4 times per year  ° Marital Status: Married  °Intimate Partner Violence: Not At Risk  ° Fear of Current or Ex-Partner: No  ° Emotionally Abused: No  ° Physically Abused: No  ° Sexually Abused: No  ° ° °Past Surgical History:  °Procedure Laterality Date  ° ABDOMINAL HYSTERECTOMY    ° APPENDECTOMY    ° Bladder Suspension    ° ° °Family History  °Problem Relation Age of Onset  ° COPD Mother   °     smoker  ° Diabetes Mother   ° COPD Father   °     smoker  ° Diabetes Father   ° Diabetes Sister   ° Diabetes Mellitus II Sister   ° Diabetes Sister   ° Diabetes Sister   ° Diabetes Sister   ° Breast cancer Other 58  ° Lung cancer Nephew 60  ° Colon cancer Neg Hx   ° Esophageal cancer Neg Hx   ° Pancreatic cancer Neg Hx   ° ° °Allergies  °Allergen Reactions  ° Benadryl [Diphenhydramine Hcl]  Anaphylaxis  ° Ace Inhibitors Cough  ° Amoxicillin   ° Aspirin   ° Chlorphen-Phenyleph-Methscop   °  REACTION: rash  ° Codeine   ° Diphenhydramine Hcl   °  REACTION: swelling-mouth  ° Fish Allergy   ° Hydrocodone   ° Indomethacin   ° Pentazocine Lactate   °  REACTION: passing out  ° Promethazine Hcl   ° Quinine   °  REACTION: itching °redface  ° ° °Current Outpatient Medications on File Prior to Visit  °Medication Sig Dispense Refill  ° acetaminophen (TYLENOL) 500 MG tablet Take 1,000 mg by mouth every 8 (eight) hours as needed.    ° aspirin 81 MG tablet Take 81 mg by mouth at bedtime.    ° atorvastatin (LIPITOR) 10   10 MG tablet Take 1 tablet (10 mg total) by mouth daily. 90 tablet 3   blood glucose meter kit and supplies KIT Dispense based on patient and insurance preference. Use up to four times daily as directed. 1 each 0   cholecalciferol (VITAMIN D3) 25 MCG (1000 UT) tablet Take 1,000 Units by mouth daily.     citalopram (CELEXA) 20 MG tablet TAKE 1 TABLET(20 MG) BY MOUTH DAILY 90 tablet 1   Cyanocobalamin (VITAMIN B-12 PO) Take 1 tablet by mouth daily.     glucose blood test strip Use to check blood glucose TID 200 each 0   Lancets 28G MISC Use to check blood glucose 4 times daily 100 each 0   levothyroxine (SYNTHROID) 88 MCG tablet TAKE 1 TABLET(88 MCG) BY MOUTH DAILY 90 tablet 3   losartan-hydrochlorothiazide (HYZAAR) 100-25 MG tablet TAKE 1 TABLET BY MOUTH DAILY 90 tablet 1   metFORMIN (GLUCOPHAGE) 500 MG tablet TAKE 1 TABLET(500 MG) BY MOUTH TWICE DAILY WITH A MEAL 180 tablet 0   pramipexole (MIRAPEX) 1.5 MG tablet Take 1 tablet (1.5 mg total) by mouth at bedtime. 90 tablet 1   No current facility-administered medications on file prior to visit.    BP 140/62 (BP Location: Left Arm, Patient Position: Sitting, Cuff Size: Large)    Pulse 72    Temp 98.5 F (36.9 C) (Oral)    Ht 5' 3" (1.6 m)    Wt 179 lb 12.8 oz (81.6 kg)    SpO2 96%    BMI 31.85 kg/m        Objective:   Physical  Exam Vitals and nursing note reviewed.  Constitutional:      General: She is not in acute distress.    Appearance: Normal appearance. She is well-developed. She is not ill-appearing.  HENT:     Head: Normocephalic and atraumatic.     Right Ear: Tympanic membrane, ear canal and external ear normal. There is no impacted cerumen.     Left Ear: Tympanic membrane, ear canal and external ear normal. There is no impacted cerumen.     Nose: Nose normal. No congestion or rhinorrhea.     Mouth/Throat:     Mouth: Mucous membranes are moist.     Pharynx: Oropharynx is clear. No oropharyngeal exudate or posterior oropharyngeal erythema.  Eyes:     General:        Right eye: No discharge.        Left eye: No discharge.     Extraocular Movements: Extraocular movements intact.     Conjunctiva/sclera: Conjunctivae normal.     Pupils: Pupils are equal, round, and reactive to light.  Neck:     Thyroid: No thyromegaly.     Vascular: No carotid bruit.     Trachea: No tracheal deviation.  Cardiovascular:     Rate and Rhythm: Normal rate and regular rhythm.     Pulses: Normal pulses.     Heart sounds: Normal heart sounds. No murmur heard.   No friction rub. No gallop.  Pulmonary:     Effort: Pulmonary effort is normal. No respiratory distress.     Breath sounds: Normal breath sounds. No stridor. No wheezing, rhonchi or rales.  Chest:     Chest wall: No tenderness.  Abdominal:     General: Abdomen is flat. Bowel sounds are normal. There is no distension.     Palpations: Abdomen is soft. There is no mass.     Tenderness: There is no abdominal  There is no right CVA tenderness, left CVA tenderness, guarding or rebound.  °   Hernia: No hernia is present.  °Musculoskeletal:     °   General: No swelling, tenderness, deformity or signs of injury. Normal range of motion.  °   Cervical back: Normal range of motion and neck supple.  °   Right lower leg: No edema.  °   Left lower leg: No edema.   °Lymphadenopathy:  °   Cervical: No cervical adenopathy.  °Skin: °   General: Skin is warm and dry.  °   Coloration: Skin is not jaundiced or pale.  °   Findings: No bruising, erythema, lesion or rash.  °Neurological:  °   General: No focal deficit present.  °   Mental Status: She is alert and oriented to person, place, and time.  °   Cranial Nerves: No cranial nerve deficit.  °   Sensory: No sensory deficit.  °   Motor: No weakness.  °   Coordination: Coordination normal.  °   Gait: Gait normal.  °   Deep Tendon Reflexes: Reflexes normal.  °Psychiatric:     °   Mood and Affect: Mood normal.     °   Behavior: Behavior normal.     °   Thought Content: Thought content normal.     °   Judgment: Judgment normal.  ° ° °   °Assessment & Plan:  °1. Controlled type 2 diabetes mellitus with complication, without long-term current use of insulin (HCC) °- Consider increasing metformin  °- Follow up in 6 months  °- CBC with Differential/Platelet; Future °- Comprehensive metabolic panel; Future °- Hemoglobin A1c; Future °- Lipid panel; Future °- TSH; Future °- Microalbumin/Creatinine Ratio, Urine; Future °- Microalbumin/Creatinine Ratio, Urine °- TSH °- Lipid panel °- Hemoglobin A1c °- Comprehensive metabolic panel °- CBC with Differential/Platelet ° °2. Essential hypertension °- Consider adding agent, such as low dose norvasc  °- CBC with Differential/Platelet; Future °- Comprehensive metabolic panel; Future °- Hemoglobin A1c; Future °- Lipid panel; Future °- TSH; Future °- Microalbumin/Creatinine Ratio, Urine; Future °- Microalbumin/Creatinine Ratio, Urine °- TSH °- Lipid panel °- Hemoglobin A1c °- Comprehensive metabolic panel °- CBC with Differential/Platelet ° °3. Mixed hyperlipidemia °- Consider increase in statin  °- CBC with Differential/Platelet; Future °- Comprehensive metabolic panel; Future °- Hemoglobin A1c; Future °- Lipid panel; Future °- TSH; Future °- Microalbumin/Creatinine Ratio, Urine; Future °-  Microalbumin/Creatinine Ratio, Urine °- TSH °- Lipid panel °- Hemoglobin A1c °- Comprehensive metabolic panel °- CBC with Differential/Platelet ° °4. Anxiety and depression °- Increase Celexa to 20 mg  ° °5. Restless leg syndrome °- Continue with Mirapex °- CBC with Differential/Platelet; Future °- Comprehensive metabolic panel; Future °- Hemoglobin A1c; Future °- Lipid panel; Future °- TSH; Future °- Microalbumin/Creatinine Ratio, Urine; Future °- Microalbumin/Creatinine Ratio, Urine °- TSH °- Lipid panel °- Hemoglobin A1c °- Comprehensive metabolic panel °- CBC with Differential/Platelet ° ° , NP ° °

## 2021-05-28 ENCOUNTER — Other Ambulatory Visit: Payer: Self-pay | Admitting: Adult Health

## 2021-05-28 DIAGNOSIS — F419 Anxiety disorder, unspecified: Secondary | ICD-10-CM

## 2021-05-28 DIAGNOSIS — F32A Depression, unspecified: Secondary | ICD-10-CM

## 2021-06-08 ENCOUNTER — Ambulatory Visit (INDEPENDENT_AMBULATORY_CARE_PROVIDER_SITE_OTHER): Payer: Medicare Other

## 2021-06-08 DIAGNOSIS — Z20822 Contact with and (suspected) exposure to covid-19: Secondary | ICD-10-CM | POA: Diagnosis not present

## 2021-06-08 DIAGNOSIS — I10 Essential (primary) hypertension: Secondary | ICD-10-CM

## 2021-06-08 DIAGNOSIS — F32A Depression, unspecified: Secondary | ICD-10-CM

## 2021-06-08 DIAGNOSIS — E782 Mixed hyperlipidemia: Secondary | ICD-10-CM

## 2021-06-08 DIAGNOSIS — E118 Type 2 diabetes mellitus with unspecified complications: Secondary | ICD-10-CM

## 2021-06-08 NOTE — Chronic Care Management (AMB) (Signed)
Chronic Care Management   CCM RN Visit Note  06/08/2021 Name: [name] MRN: [id number] DOB: 02-16-1951  Subjective: [name] is a 71 y.o. year old female who is a primary care patient of [name], NP. The care management team was consulted for assistance with disease management and care coordination needs.    Engaged with patient by telephone for follow up visit in response to provider referral for case management and/or care coordination services.   Consent to Services:  The patient was given information about Chronic Care Management services, agreed to services, and gave verbal consent prior to initiation of services.  Please see initial visit note for detailed documentation.   Patient agreed to services and verbal consent obtained.   Assessment: Review of patient past medical history, allergies, medications, health status, including review of consultants reports, laboratory and other test data, was performed as part of comprehensive evaluation and provision of chronic care management services.   SDOH (Social Determinants of Health) assessments and interventions performed:    CCM Care Plan  Allergies  Allergen Reactions   Benadryl [Diphenhydramine Hcl] Anaphylaxis   Ace Inhibitors Cough   Amoxicillin    Aspirin    Chlorphen-Phenyleph-Methscop     REACTION: rash   Codeine    Diphenhydramine Hcl     REACTION: swelling-mouth   Fish Allergy    Hydrocodone    Indomethacin    Pentazocine Lactate     REACTION: passing out   Promethazine Hcl    Quinine     REACTION: itching redface    Outpatient Encounter Medications as of 06/08/2021  Medication Sig   acetaminophen (TYLENOL) 500 MG tablet Take 1,000 mg by mouth every 8 (eight) hours as needed.   aspirin 81 MG tablet Take 81 mg by mouth at bedtime.   atorvastatin (LIPITOR) 10 MG tablet Take 1 tablet (10 mg total) by mouth daily.   blood glucose meter kit and supplies KIT Dispense based  on patient and insurance preference. Use up to four times daily as directed.   cholecalciferol (VITAMIN D3) 25 MCG (1000 UT) tablet Take 1,000 Units by mouth daily.   citalopram (CELEXA) 20 MG tablet TAKE 1 TABLET(20 MG) BY MOUTH DAILY   Cyanocobalamin (VITAMIN B-12 PO) Take 1 tablet by mouth daily.   glucose blood test strip Use to check blood glucose TID   Lancets 28G MISC Use to check blood glucose 4 times daily   levothyroxine (SYNTHROID) 88 MCG tablet TAKE 1 TABLET(88 MCG) BY MOUTH DAILY   losartan-hydrochlorothiazide (HYZAAR) 100-25 MG tablet TAKE 1 TABLET BY MOUTH DAILY   metFORMIN (GLUCOPHAGE) 500 MG tablet TAKE 1 TABLET(500 MG) BY MOUTH TWICE DAILY WITH A MEAL   pramipexole (MIRAPEX) 1.5 MG tablet Take 1 tablet (1.5 mg total) by mouth at bedtime.   No facility-administered encounter medications on file as of 06/08/2021.    Patient Active Problem List   Diagnosis Date Noted   Fracture of second toe, left, closed, initial encounter 09/26/2017   GERD (gastroesophageal reflux disease) 10/19/2014   Essential hypertension 08/16/2014   Breast pain, right 05/20/2014   Restless leg syndrome 05/20/2014   Hip pain, bilateral 10/23/2011   Knee pain, bilateral 10/23/2011   EDEMA 01/15/2009   Hypothyroidism 12/11/2006   Diabetes type 2, uncontrolled 12/11/2006   DIVERTICULOSIS, COLON 12/11/2006   HEADACHE 12/11/2006    Conditions to be addressed/monitored:HTN, HLD, and DMII  Care Plan : RN Care Manager Plan of Care  Updates made by Peter Garter  J, RN since 06/08/2021 12:00 AM     Problem: Chronic Disease Management and Care Coordination Needs (DM,HTN and HLD)   Priority: High     Long-Range Goal: Establish Plan of Care for Chronic Disease Management Needs (DM,HTN and HLD)   Start Date: 04/11/2021  Expected End Date: 04/08/2022  Priority: High  Note:   Current Barriers:  Chronic Disease Management support and education needs related to HTN, HLD, and DMII  States she has  been having more stress recently due to her husbands bladder cancer and he is having treatments that have been making him sick. States she has not been getting much sleep.  States her CBGs have been ranging 111-148. States she has been eating less since her husband is eating less.   States her B/P has been ranging 126-138/66-91.   States she would like to talk to someone about her stress and support groups that might be closer to her that are not online.   RNCM Clinical Goal(s):  Patient will verbalize understanding of plan for management of HTN, HLD, and DMII as evidenced by voiced adherence to plan of care verbalize basic understanding of  HTN, HLD, and DMII disease process and self health management plan as evidenced by voiced understanding and teach back take all medications exactly as prescribed and will call provider for medication related questions as evidenced by dispense report and pt verbalization attend all scheduled medical appointments: [name] NP 08/08/21 as evidenced by medical record demonstrate Improved adherence to prescribed treatment plan for HTN, HLD, and DMII as evidenced by readings within limits, voiced adherence to plan of care continue to work with RN Care Manager to address care management and care coordination needs related to  HTN, HLD, and DMII as evidenced by adherence to CM Team Scheduled appointments through collaboration with RN Care manager, provider, and care team.   Interventions: 1:1 collaboration with primary care provider regarding development and update of comprehensive plan of care as evidenced by provider attestation and co-signature Inter-disciplinary care team collaboration (see longitudinal plan of care) Evaluation of current treatment plan related to  self management and patient's adherence to plan as established by provider   Diabetes Interventions:  (Status:  Goal on track:  NO.) Long Term Goal Assessed patient's understanding of A1c goal:  <7% Provided education to patient about basic DM disease process Reviewed medications with patient and discussed importance of medication adherence Counseled on importance of regular laboratory monitoring as prescribed Discussed plans with patient for ongoing care management follow up and provided patient with direct contact information for care management team Advised patient, providing education and rationale, to check cbg daily and record, calling provider for findings outside established parameters Referral made to social work team for assistance with caregiver stress and support group resources  Reinforced how stress  can make her CBGs range higher.  Reinforced to try to eat a healthy well balanced diet when she is stressed and to get adequate sleep Lab Results  Component Value Date   HGBA1C 7.8 (H) 05/09/2021   Hyperlipidemia Interventions:  (Status:  Goal on track:  Yes.) Long Term Goal Medication review performed; medication list updated in electronic medical record.  Provider established cholesterol goals reviewed Counseled on importance of regular laboratory monitoring as prescribed Reviewed role and benefits of statin for ASCVD risk reduction Reviewed importance of limiting foods high in cholesterol  Hypertension Interventions:  (Status:  Goal on track:  Yes.) Long Term Goal Last practice recorded BP readings:  BP  Readings from Last 3 Encounters:  05/09/21 140/62  03/31/21 138/80  01/05/21 140/86  Most recent eGFR/CrCl: No results found for: EGFR  No components found for: CRCL  Evaluation of current treatment plan related to hypertension self management and patient's adherence to plan as established by provider Provided education to patient re: stroke prevention, s/s of heart attack and stroke Reviewed medications with patient and discussed importance of compliance Discussed plans with patient for ongoing care management follow up and provided patient with direct contact  information for care management team Provided education on prescribed diet low sodium low CHO Reinforced  importance of controlling stress to help with B/P.  Encouraged to listen to music or meditate to help relax    Patient Goals/Self-Care Activities: Take all medications as prescribed Attend all scheduled provider appointments Call pharmacy for medication refills 3-7 days in advance of running out of medications Attend church or other social activities Perform all self care activities independently  Call provider office for new concerns or questions  keep appointment with eye doctor check blood sugar at prescribed times: once daily and when you have symptoms of low or high blood sugar check feet daily for cuts, sores or redness take the blood sugar log to all doctor visits drink 6 to 8 glasses of water each day fill half of plate with vegetables manage portion size switch to sugar-free drinks keep feet up while sitting check blood pressure 3 times per week choose a place to take my blood pressure (home, clinic or office, retail store) take blood pressure log to all doctor appointments eat more whole grains, fruits and vegetables, lean meats and healthy fats limit salt intake to 2351m/day call for medicine refill 2 or 3 days before it runs out take all medications exactly as prescribed call doctor with any symptoms you believe are related to your medicine  Follow Up Plan:  Telephone follow up appointment with care management team member scheduled for:  07/10/21 The patient has been provided with contact information for the care management team and has been advised to call with any health related questions or concerns.       Plan:Telephone follow up appointment with care management team member scheduled for:  07/10/21 The patient has been provided with contact information for the care management team and has been advised to call with any health related questions or concerns.   MPeter GarterRN, BJackquline Denmark CDE Care Management Coordinator [facility] [phone]

## 2021-06-08 NOTE — Patient Instructions (Signed)
Visit Information  Thank you for taking time to visit with me today. Please don't hesitate to contact me if I can be of assistance to you before our next scheduled telephone appointment.  Following are the goals we discussed today:  Take all medications as prescribed Attend all scheduled provider appointments Call pharmacy for medication refills 3-7 days in advance of running out of medications Attend church or other social activities Perform all self care activities independently  Call provider office for new concerns or questions  keep appointment with eye doctor check blood sugar at prescribed times: once daily and when you have symptoms of low or high blood sugar check feet daily for cuts, sores or redness take the blood sugar log to all doctor visits drink 6 to 8 glasses of water each day fill half of plate with vegetables manage portion size switch to sugar-free drinks keep feet up while sitting check blood pressure 3 times per week choose a place to take my blood pressure (home, clinic or office, retail store) take blood pressure log to all doctor appointments eat more whole grains, fruits and vegetables, lean meats and healthy fats limit salt intake to 2300mg /day call for medicine refill 2 or 3 days before it runs out take all medications exactly as prescribed call doctor with any symptoms you believe are related to your medicine  Our next appointment is by telephone on 07/08/21 at 11:30 AM  Please call the care guide team at [phone] if you need to cancel or reschedule your appointment.   If you are experiencing a Mental Health or [place] or need someone to talk to, please call the Suicide and Crisis Lifeline: 988 call the [place] National Suicide Prevention Lifeline: (431)170-9229 or TTY: 918-345-2949 TTY 816-144-6922) to talk to a trained counselor call 1-800-273-TALK (toll free, 24 hour hotline) call 911   Patient verbalizes understanding of  instructions and care plan provided today and agrees to view in [place]. Active MyChart status confirmed with patient.    Peter Garter RN, Jackquline Denmark, CDE Care Management Coordinator [facility] [phone]

## 2021-06-13 DIAGNOSIS — I10 Essential (primary) hypertension: Secondary | ICD-10-CM | POA: Diagnosis not present

## 2021-06-13 DIAGNOSIS — E1159 Type 2 diabetes mellitus with other circulatory complications: Secondary | ICD-10-CM

## 2021-06-13 DIAGNOSIS — Z7984 Long term (current) use of oral hypoglycemic drugs: Secondary | ICD-10-CM | POA: Diagnosis not present

## 2021-06-13 DIAGNOSIS — E785 Hyperlipidemia, unspecified: Secondary | ICD-10-CM

## 2021-06-14 NOTE — Progress Notes (Signed)
Scheduled 06/16/21  [facility]  Direct Dial: [phone]

## 2021-06-16 ENCOUNTER — Ambulatory Visit (INDEPENDENT_AMBULATORY_CARE_PROVIDER_SITE_OTHER): Payer: Medicare Other | Admitting: Licensed Clinical Social Worker

## 2021-06-16 DIAGNOSIS — F32A Depression, unspecified: Secondary | ICD-10-CM

## 2021-06-16 DIAGNOSIS — E118 Type 2 diabetes mellitus with unspecified complications: Secondary | ICD-10-CM

## 2021-06-16 DIAGNOSIS — I10 Essential (primary) hypertension: Secondary | ICD-10-CM

## 2021-06-22 NOTE — Patient Instructions (Signed)
Visit Information ? ?Thank you for taking time to visit with me today. Please don't hesitate to contact me if I can be of assistance to you before our next scheduled telephone appointment. ? ?Following are the goals we discussed today:  ?Patient Goals/Self-Care Activities: Over the next 120 days ?Attend scheduled medical appointments ?Continue to utilize healthy coping skills and/or supportive resources provided ?Contact PCP office with any questions or concerns ? ?Our next appointment is by telephone on 07/28/21 at 2:15 PM ? ?Please call the care guide team at [phone] if you need to cancel or reschedule your appointment.  ? ?If you are experiencing a Mental Health or [place] or need someone to talk to, please call the Suicide and Crisis Lifeline: 988 ?call 911  ? ?Following is a copy of your full plan of care:  ?Care Plan : [place]  ?Updates made by Rebekah Chesterfield, LCSW since 06/22/2021 12:00 AM  ?  ? ?Problem: Coping Skills (General Plan of Care)   ?  ? ?Long-Range Goal: Coping Skills Enhanced   ?Start Date: 06/16/2021  ?This Visit's Progress: On track  ?Priority: High  ?Note:   ?Current barriers:   ?Acute Mental Health needs related to Anxiety ?Needs Support, Education, and Care Coordination in order to meet unmet mental health needs. ?Clinical Goal(s): verbalize understanding of plan for management of Anxiety   ?Clinical Interventions:  ?Assessed patient's previous and current treatment, coping skills, support system and barriers to care  ?Has a niece out of state. Emotionally supportive and a very good friend. Both have lost their spouses Daughter resides next door ?Patient was successful at identifying healthy coping skills (church, time with friend, prayer weavers) Family has received funds from cancer center through gift cards ?Strategies to strengthen boundaries with loved ones discussed ?Patient continues to participate in med management ?Mindfulness or Relaxation training  provided ?Active listening / Reflection utilized  ?Emotional Support Provided ?Problem Solving /Task Center strategies reviewed ?Provided psychoeducation for mental health needs  ?Provided brief CBT  ?Caregiver stress acknowledged  ?Verbalization of feelings encouraged  ; ?Review resources, discussed options and provided patient information ?1:1 collaboration with primary care provider regarding development and update of comprehensive plan of care as evidenced by provider attestation and co-signature ?Inter-disciplinary care team collaboration (see longitudinal plan of care) ?Patient Goals/Self-Care Activities: Over the next 120 days ?Attend scheduled medical appointments ?Continue to utilize healthy coping skills and/or supportive resources provided ?Contact PCP office with any questions or concerns ? ?  ?  ? ? ?[name] was given information about Care Management services by the embedded care coordination team including:  ?Care Management services include personalized support from designated clinical staff supervised by her physician, including individualized plan of care and coordination with other care providers ?24/7 contact phone numbers for assistance for urgent and routine care needs. ?The patient may stop CCM services at any time (effective at the end of the month) by phone call to the office staff. ? ?Patient agreed to services and verbal consent obtained.  ? ?Patient verbalizes understanding of instructions and care plan provided today and agrees to view in [facility]. Active MyChart status confirmed with patient.   ? ?Christa See, MSW, LCSW ?[facility] ?[facility] ?[email or web address] ?Phone [phone] ?4:42 PM ? ? ?  ?

## 2021-06-22 NOTE — Chronic Care Management (AMB) (Signed)
?Chronic Care Management  ? ? Clinical Social Work Note ? ?06/22/2021 ?Name: [name] MRN: [id number] DOB: 04/25/1950 ? ?[name] is a 71 y.o. year old female who is a primary care patient of [name], NP. The CCM team was consulted to assist the patient with chronic disease management and/or care coordination needs related to: Caregiver Stress.  ? ?Engaged with patient's spouse by telephone for initial visit in response to provider referral for social work chronic care management and care coordination services.  ? ?Consent to Services:  ?The patient was given the following information about Chronic Care Management services today, agreed to services, and gave verbal consent: 1. CCM service includes personalized support from designated clinical staff supervised by the primary care provider, including individualized plan of care and coordination with other care providers 2. 24/7 contact phone numbers for assistance for urgent and routine care needs. 3. Service will only be billed when office clinical staff spend 20 minutes or more in a month to coordinate care. 4. Only one practitioner may furnish and bill the service in a calendar month. 5.The patient may stop CCM services at any time (effective at the end of the month) by phone call to the office staff. 6. The patient will be responsible for cost sharing (co-pay) of up to 20% of the service fee (after annual deductible is met). Patient agreed to services and consent obtained. ? ?Patient agreed to services and consent obtained.  ? ?Assessment: Review of patient past medical history, allergies, medications, and health status, including review of relevant consultants reports was performed today as part of a comprehensive evaluation and provision of chronic care management and care coordination services.    ? ?SDOH (Social Determinants of Health) assessments and interventions performed:   ? ?Advanced Directives Status: Not addressed  in this encounter. ? ?CCM Care Plan ? ?Allergies  ?Allergen Reactions  ? Benadryl [Diphenhydramine Hcl] Anaphylaxis  ? Ace Inhibitors Cough  ? Amoxicillin   ? Aspirin   ? Chlorphen-Phenyleph-Methscop   ?  REACTION: rash  ? Codeine   ? Diphenhydramine Hcl   ?  REACTION: swelling-mouth  ? Fish Allergy   ? Hydrocodone   ? Indomethacin   ? Pentazocine Lactate   ?  REACTION: passing out  ? Promethazine Hcl   ? Quinine   ?  REACTION: itching ?redface  ? ? ?Outpatient Encounter Medications as of 06/16/2021  ?Medication Sig  ? acetaminophen (TYLENOL) 500 MG tablet Take 1,000 mg by mouth every 8 (eight) hours as needed.  ? aspirin 81 MG tablet Take 81 mg by mouth at bedtime.  ? atorvastatin (LIPITOR) 10 MG tablet Take 1 tablet (10 mg total) by mouth daily.  ? blood glucose meter kit and supplies KIT Dispense based on patient and insurance preference. Use up to four times daily as directed.  ? cholecalciferol (VITAMIN D3) 25 MCG (1000 UT) tablet Take 1,000 Units by mouth daily.  ? citalopram (CELEXA) 20 MG tablet TAKE 1 TABLET(20 MG) BY MOUTH DAILY  ? Cyanocobalamin (VITAMIN B-12 PO) Take 1 tablet by mouth daily.  ? glucose blood test strip Use to check blood glucose TID  ? Lancets 28G MISC Use to check blood glucose 4 times daily  ? levothyroxine (SYNTHROID) 88 MCG tablet TAKE 1 TABLET(88 MCG) BY MOUTH DAILY  ? losartan-hydrochlorothiazide (HYZAAR) 100-25 MG tablet TAKE 1 TABLET BY MOUTH DAILY  ? metFORMIN (GLUCOPHAGE) 500 MG tablet TAKE 1 TABLET(500 MG) BY MOUTH TWICE DAILY WITH A MEAL  ?  pramipexole (MIRAPEX) 1.5 MG tablet Take 1 tablet (1.5 mg total) by mouth at bedtime.  ? ?No facility-administered encounter medications on file as of 06/16/2021.  ? ? ?Patient Active Problem List  ? Diagnosis Date Noted  ? Fracture of second toe, left, closed, initial encounter 09/26/2017  ? GERD (gastroesophageal reflux disease) 10/19/2014  ? Essential hypertension 08/16/2014  ? Breast pain, right 05/20/2014  ? Restless leg syndrome  05/20/2014  ? Hip pain, bilateral 10/23/2011  ? Knee pain, bilateral 10/23/2011  ? EDEMA 01/15/2009  ? Hypothyroidism 12/11/2006  ? Diabetes type 2, uncontrolled 12/11/2006  ? DIVERTICULOSIS, COLON 12/11/2006  ? HEADACHE 12/11/2006  ? ? ?Conditions to be addressed/monitored: HTN and DMII; Caregiver Stress ? ?Care Plan : LCSW Plan of Care  ?Updates made by Rebekah Chesterfield, LCSW since 06/22/2021 12:00 AM  ?  ? ?Problem: Coping Skills (General Plan of Care)   ?  ? ?Long-Range Goal: Coping Skills Enhanced   ?Start Date: 06/16/2021  ?This Visit's Progress: On track  ?Priority: High  ?Note:   ?Current barriers:   ?Acute Mental Health needs related to Anxiety ?Needs Support, Education, and Care Coordination in order to meet unmet mental health needs. ?Clinical Goal(s): verbalize understanding of plan for management of Anxiety   ?Clinical Interventions:  ?Assessed patient's previous and current treatment, coping skills, support system and barriers to care  ?Has a niece out of state. Emotionally supportive and a very good friend. Both have lost their spouses Daughter resides next door ?Patient was successful at identifying healthy coping skills (church, time with friend, prayer weavers) Family has received funds from cancer center through gift cards ?Strategies to strengthen boundaries with loved ones discussed ?Patient continues to participate in med management ?Mindfulness or Relaxation training provided ?Active listening / Reflection utilized  ?Emotional Support Provided ?Problem Solving /Task Center strategies reviewed ?Provided psychoeducation for mental health needs  ?Provided brief CBT  ?Caregiver stress acknowledged  ?Verbalization of feelings encouraged  ; ?Review resources, discussed options and provided patient information ?1:1 collaboration with primary care provider regarding development and update of comprehensive plan of care as evidenced by provider attestation and co-signature ?Inter-disciplinary care team  collaboration (see longitudinal plan of care) ?Patient Goals/Self-Care Activities: Over the next 120 days ?Attend scheduled medical appointments ?Continue to utilize healthy coping skills and/or supportive resources provided ?Contact PCP office with any questions or concerns ? ?  ?  ?  ? ?Christa See, MSW, LCSW ?[place] [facility] ?[facility] ?[email or web address] ?Phone [phone] ?4:40 PM ? ? ? ?

## 2021-06-28 DIAGNOSIS — H43812 Vitreous degeneration, left eye: Secondary | ICD-10-CM | POA: Diagnosis not present

## 2021-06-29 ENCOUNTER — Other Ambulatory Visit: Payer: Self-pay | Admitting: Adult Health

## 2021-07-01 ENCOUNTER — Other Ambulatory Visit: Payer: Self-pay | Admitting: Adult Health

## 2021-07-01 DIAGNOSIS — E118 Type 2 diabetes mellitus with unspecified complications: Secondary | ICD-10-CM

## 2021-07-01 DIAGNOSIS — G2581 Restless legs syndrome: Secondary | ICD-10-CM

## 2021-07-01 DIAGNOSIS — Z760 Encounter for issue of repeat prescription: Secondary | ICD-10-CM

## 2021-07-04 ENCOUNTER — Ambulatory Visit (INDEPENDENT_AMBULATORY_CARE_PROVIDER_SITE_OTHER): Payer: Medicare Other | Admitting: Adult Health

## 2021-07-04 VITALS — BP 150/70 | HR 80 | Temp 98.2°F | Ht 63.0 in | Wt 181.0 lb

## 2021-07-04 DIAGNOSIS — M7541 Impingement syndrome of right shoulder: Secondary | ICD-10-CM | POA: Diagnosis not present

## 2021-07-04 MED ORDER — METHYLPREDNISOLONE ACETATE 80 MG/ML IJ SUSP
80.0000 mg | Freq: Once | INTRAMUSCULAR | Status: AC
  Administered 2021-07-04: 80 mg via INTRA_ARTICULAR

## 2021-07-04 NOTE — Progress Notes (Signed)
? ?Subjective:  ? ? Patient ID: [name], female    DOB: 1951-01-15, 71 y.o.   MRN: [id number] ? ?HPI ?71 year old female who  has a past medical history of Allergy, Anemia, Anxiety, ANXIETY DEPRESSION (09/15/2007), Arthritis, ASTHMA (05/15/2009), Asthma, DIABETES MELLITUS, TYPE II (12/11/2006), DIVERTICULOSIS, COLON (12/11/2006), Edema (01/15/2009), Gout, Headache(784.0) (12/11/2006), Hyperlipidemia, HYPERTENSION NEC (08/14/2007), HYPOTHYROIDISM (12/11/2006), PELVIC PAIN, CHRONIC (08/14/2007), and Restless leg syndrome. ? ?She presents to the office today for complaint of right shoulder pain. She has had steroid injections into her left knee in the past and has done fairly well with this.  She has been having some acute shoulder pain for the last few weeks but a week ago it became hard for her to raise her arm over her head, has decreased grip strength and loss of ROM of the right shoulder. Pain has gotten worse over the last week. Has been using Nsaids without relief  ? ?She denies trauma or injury  ? ?Review of Systems ?See HPI  ? ?Past Medical History:  ?Diagnosis Date  ? Allergy   ? Anemia   ? Anxiety   ? ANXIETY DEPRESSION 09/15/2007  ? Arthritis   ? ASTHMA 05/15/2009  ? Asthma   ? DIABETES MELLITUS, TYPE II 12/11/2006  ? DIVERTICULOSIS, COLON 12/11/2006  ? Edema 01/15/2009  ? Gout   ? pt denies; toe was broken  ? Headache(784.0) 12/11/2006  ? Hyperlipidemia   ? HYPERTENSION NEC 08/14/2007  ? HYPOTHYROIDISM 12/11/2006  ? PELVIC PAIN, CHRONIC 08/14/2007  ? Restless leg syndrome   ? ? ?Social History  ? ?Socioeconomic History  ? Marital status: Married  ?  Spouse name: Not on file  ? Number of children: Not on file  ? Years of education: Not on file  ? Highest education level: 12th grade  ?Occupational History  ? Not on file  ?Tobacco Use  ? Smoking status: Never  ? Smokeless tobacco: Never  ?Substance and Sexual Activity  ? Alcohol use: Yes  ?  Alcohol/week: 0.0 standard drinks  ?  Comment: very rare  ? Drug use: No   ? Sexual activity: Not on file  ?Other Topics Concern  ? Not on file  ?Social History Narrative  ? Not on file  ? ?Social Determinants of Health  ? ?Financial Resource Strain: Medium Risk  ? Difficulty of Paying Living Expenses: Somewhat hard  ?Food Insecurity: Food Insecurity Present  ? Worried About Charity fundraiser in the Last Year: Sometimes true  ? Ran Out of Food in the Last Year: Sometimes true  ?Transportation Needs: No Transportation Needs  ? Lack of Transportation (Medical): No  ? Lack of Transportation (Non-Medical): No  ?Physical Activity: Insufficiently Active  ? Days of Exercise per Week: 2 days  ? Minutes of Exercise per Session: 10 min  ?Stress: No Stress Concern Present  ? Feeling of Stress : Only a little  ?Social Connections: Socially Integrated  ? Frequency of Communication with Friends and Family: More than three times a week  ? Frequency of Social Gatherings with Friends and Family: More than three times a week  ? Attends Religious Services: More than 4 times per year  ? Active Member of Clubs or Organizations: Yes  ? Attends Archivist Meetings: More than 4 times per year  ? Marital Status: Married  ?Intimate Partner Violence: Not At Risk  ? Fear of Current or Ex-Partner: No  ? Emotionally Abused: No  ? Physically Abused: No  ?  Sexually Abused: No  ? ? ?Past Surgical History:  ?Procedure Laterality Date  ? ABDOMINAL HYSTERECTOMY    ? APPENDECTOMY    ? Bladder Suspension    ? ? ?Family History  ?Problem Relation Age of Onset  ? COPD Mother   ?     smoker  ? Diabetes Mother   ? COPD Father   ?     smoker  ? Diabetes Father   ? Diabetes Sister   ? Diabetes Mellitus II Sister   ? Diabetes Sister   ? Diabetes Sister   ? Diabetes Sister   ? Breast cancer Other 58  ? Lung cancer Nephew 60  ? Colon cancer Neg Hx   ? Esophageal cancer Neg Hx   ? Pancreatic cancer Neg Hx   ? ? ?Allergies  ?Allergen Reactions  ? Benadryl [Diphenhydramine Hcl] Anaphylaxis  ? Ace Inhibitors Cough  ?  Amoxicillin   ? Aspirin   ? Chlorphen-Phenyleph-Methscop   ?  REACTION: rash  ? Codeine   ? Diphenhydramine Hcl   ?  REACTION: swelling-mouth  ? Fish Allergy   ? Hydrocodone   ? Indomethacin   ? Pentazocine Lactate   ?  REACTION: passing out  ? Promethazine Hcl   ? Quinine   ?  REACTION: itching ?redface  ? ? ?Current Outpatient Medications on File Prior to Visit  ?Medication Sig Dispense Refill  ? acetaminophen (TYLENOL) 500 MG tablet Take 1,000 mg by mouth every 8 (eight) hours as needed.    ? aspirin 81 MG tablet Take 81 mg by mouth at bedtime.    ? atorvastatin (LIPITOR) 10 MG tablet Take 1 tablet (10 mg total) by mouth daily. 90 tablet 3  ? blood glucose meter kit and supplies KIT Dispense based on patient and insurance preference. Use up to four times daily as directed. 1 each 0  ? cholecalciferol (VITAMIN D3) 25 MCG (1000 UT) tablet Take 1,000 Units by mouth daily.    ? citalopram (CELEXA) 20 MG tablet TAKE 1 TABLET(20 MG) BY MOUTH DAILY 90 tablet 1  ? Cyanocobalamin (VITAMIN B-12 PO) Take 1 tablet by mouth daily.    ? glucose blood test strip Use to check blood glucose TID 200 each 0  ? Lancets 28G MISC Use to check blood glucose 4 times daily 100 each 0  ? levothyroxine (SYNTHROID) 88 MCG tablet TAKE 1 TABLET(88 MCG) BY MOUTH DAILY 90 tablet 3  ? losartan-hydrochlorothiazide (HYZAAR) 100-25 MG tablet TAKE 1 TABLET BY MOUTH DAILY 90 tablet 1  ? metFORMIN (GLUCOPHAGE) 500 MG tablet TAKE 1 TABLET(500 MG) BY MOUTH TWICE DAILY WITH A MEAL 180 tablet 0  ? pramipexole (MIRAPEX) 1.5 MG tablet TAKE 1 TABLET(1.5 MG) BY MOUTH AT BEDTIME 90 tablet 1  ? ?No current facility-administered medications on file prior to visit.  ? ? ?There were no vitals taken for this visit. ? ? ?   ?Objective:  ? Physical Exam ?Vitals and nursing note reviewed.  ?Constitutional:   ?   Appearance: Normal appearance.  ?Musculoskeletal:  ?   Right shoulder: Tenderness and bony tenderness present. Decreased range of motion. Decreased strength.   ?Skin: ?   General: Skin is warm and dry.  ?Neurological:  ?   General: No focal deficit present.  ?   Mental Status: She is alert and oriented to person, place, and time.  ?Psychiatric:     ?   Mood and Affect: Mood normal.     ?   Behavior:  Behavior normal.     ?   Thought Content: Thought content normal.     ?   Judgment: Judgment normal.  ? ?   ?Assessment & Plan:  ?1. Rotator cuff impingement syndrome of right shoulder ?- Discussed various options for treatment including steroid injection, PT and medication. She opted for steroid injection  ?Shoulder injection ?Verbal consent obtained and verified. ?Sterile betadine prep. Furthur cleansed with alcohol. ?Topical analgesic spray: Ethyl chloride. ?Joint: right subacromial injection ?Approached in typical fashion with: posterior approach ?Completed without difficulty ?Meds: 3 cc lidocaine 2% no epi, 1 cc depomedrol 58m/cc ?Needle:1.5 inch 25 gauge ?Aftercare instructions and Red flags advised. ?Immediate improvement in pain noted ? ?- methylPREDNISolone acetate (DEPO-MEDROL) injection 80 mg ? ? ?CDorothyann Peng NP ? ?

## 2021-07-10 ENCOUNTER — Ambulatory Visit: Payer: Medicare Other

## 2021-07-10 DIAGNOSIS — M7541 Impingement syndrome of right shoulder: Secondary | ICD-10-CM

## 2021-07-10 DIAGNOSIS — E118 Type 2 diabetes mellitus with unspecified complications: Secondary | ICD-10-CM

## 2021-07-10 DIAGNOSIS — E782 Mixed hyperlipidemia: Secondary | ICD-10-CM

## 2021-07-10 DIAGNOSIS — I10 Essential (primary) hypertension: Secondary | ICD-10-CM

## 2021-07-10 NOTE — Chronic Care Management (AMB) (Signed)
?Chronic Care Management  ? ?CCM RN Visit Note ? ?07/10/2021 ?Name: [name] MRN: [id number] DOB: 1950/06/25 ? ?Subjective: ?[name] is a 71 y.o. year old female who is a primary care patient of [name], NP. The care management team was consulted for assistance with disease management and care coordination needs.   ? ?Engaged with patient by telephone for follow up visit in response to provider referral for case management and/or care coordination services.  ? ?Consent to Services:  ?The patient was given information about Chronic Care Management services, agreed to services, and gave verbal consent prior to initiation of services.  Please see initial visit note for detailed documentation.  ? ?Patient agreed to services and verbal consent obtained.  ? ?Assessment: Review of patient past medical history, allergies, medications, health status, including review of consultants reports, laboratory and other test data, was performed as part of comprehensive evaluation and provision of chronic care management services.  ? ?SDOH (Social Determinants of Health) assessments and interventions performed:   ? ?CCM Care Plan ? ?Allergies  ?Allergen Reactions  ? Benadryl [Diphenhydramine Hcl] Anaphylaxis  ? Ace Inhibitors Cough  ? Amoxicillin   ? Aspirin   ? Chlorphen-Phenyleph-Methscop   ?  REACTION: rash  ? Codeine   ? Diphenhydramine Hcl   ?  REACTION: swelling-mouth  ? Fish Allergy   ? Hydrocodone   ? Indomethacin   ? Pentazocine Lactate   ?  REACTION: passing out  ? Promethazine Hcl   ? Quinine   ?  REACTION: itching ?redface  ? ? ?Outpatient Encounter Medications as of 07/10/2021  ?Medication Sig  ? acetaminophen (TYLENOL) 500 MG tablet Take 1,000 mg by mouth every 8 (eight) hours as needed.  ? aspirin 81 MG tablet Take 81 mg by mouth at bedtime.  ? atorvastatin (LIPITOR) 10 MG tablet Take 1 tablet (10 mg total) by mouth daily.  ? blood glucose meter kit and supplies KIT Dispense based  on patient and insurance preference. Use up to four times daily as directed.  ? cholecalciferol (VITAMIN D3) 25 MCG (1000 UT) tablet Take 1,000 Units by mouth daily.  ? citalopram (CELEXA) 20 MG tablet TAKE 1 TABLET(20 MG) BY MOUTH DAILY  ? Cyanocobalamin (VITAMIN B-12 PO) Take 1 tablet by mouth daily.  ? glucose blood test strip Use to check blood glucose TID  ? Lancets 28G MISC Use to check blood glucose 4 times daily  ? levothyroxine (SYNTHROID) 88 MCG tablet TAKE 1 TABLET(88 MCG) BY MOUTH DAILY  ? losartan-hydrochlorothiazide (HYZAAR) 100-25 MG tablet TAKE 1 TABLET BY MOUTH DAILY  ? metFORMIN (GLUCOPHAGE) 500 MG tablet TAKE 1 TABLET(500 MG) BY MOUTH TWICE DAILY WITH A MEAL  ? pramipexole (MIRAPEX) 1.5 MG tablet TAKE 1 TABLET(1.5 MG) BY MOUTH AT BEDTIME  ? ?No facility-administered encounter medications on file as of 07/10/2021.  ? ? ?Patient Active Problem List  ? Diagnosis Date Noted  ? Fracture of second toe, left, closed, initial encounter 09/26/2017  ? GERD (gastroesophageal reflux disease) 10/19/2014  ? Essential hypertension 08/16/2014  ? Breast pain, right 05/20/2014  ? Restless leg syndrome 05/20/2014  ? Hip pain, bilateral 10/23/2011  ? Knee pain, bilateral 10/23/2011  ? EDEMA 01/15/2009  ? Hypothyroidism 12/11/2006  ? Diabetes type 2, uncontrolled 12/11/2006  ? DIVERTICULOSIS, COLON 12/11/2006  ? HEADACHE 12/11/2006  ? ? ?Conditions to be addressed/monitored:HTN, HLD, DMII, and Osteoarthritis ? ?Care Plan : RN Care Manager Plan of Care  ?Updates made by Dimitri Ped,  RN since 07/10/2021 12:00 AM  ?  ? ?Problem: Chronic Disease Management and Care Coordination Needs (DM,HTN and HLD)   ?Priority: High  ?  ? ?Long-Range Goal: Establish Plan of Care for Chronic Disease Management Needs (DM,HTN and HLD)   ?Start Date: 04/11/2021  ?Expected End Date: 04/08/2022  ?Priority: High  ?Note:   ?Current Barriers:  ?Chronic Disease Management support and education needs related to HTN, HLD, and DMII  ?States she  has been handling her stress better due to her husbands bladder cancer and he is having treatments. States she has been having pain in her rt shoulder that she saw [place] for last week and got a steroid shot.  States that shot has not help yet.  States her CBGs have been ranging 118-125 and she has not noticed them going up since the steroid shot. States she has been eating less since her husband is eating less. States she is eating more vegetables and salads.  States her B/P has been ranging 125-137/63-91.   States that she and her daughter planning a trip with her husband back to his home-place in [place] hopefully in June   ?RNCM Clinical Goal(s):  ?Patient will verbalize understanding of plan for management of HTN, HLD, and DMII as evidenced by voiced adherence to plan of care ?verbalize basic understanding of  HTN, HLD, and DMII disease process and self health management plan as evidenced by voiced understanding and teach back ?take all medications exactly as prescribed and will call provider for medication related questions as evidenced by dispense report and pt verbalization ?attend all scheduled medical appointments: [name] NP 08/08/21, CCM LCSW 07/28/21 as evidenced by medical record ?demonstrate Improved adherence to prescribed treatment plan for HTN, HLD, and DMII as evidenced by readings within limits, voiced adherence to plan of care ?continue to work with RN Care Manager to address care management and care coordination needs related to  HTN, HLD, and DMII as evidenced by adherence to CM Team Scheduled appointments through collaboration with RN Care manager, provider, and care team.  ? ?Interventions: ?1:1 collaboration with primary care provider regarding development and update of comprehensive plan of care as evidenced by provider attestation and co-signature ?Inter-disciplinary care team collaboration (see longitudinal plan of care) ?Evaluation of current treatment plan related to  self  management and patient's adherence to plan as established by provider ? ? ?Pain Interventions:  (Status:  New goal.) Long Term Goal ?Pain assessment performed ?Medications reviewed ?Reviewed provider established plan for pain management ?Discussed importance of adherence to all scheduled medical appointments ?Counseled on the importance of reporting any/all new or changed pain symptoms or management strategies to pain management provider ?Reviewed with patient prescribed pharmacological and nonpharmacological pain relief strategies ?Advised patient to discuss continued shoulder pain and possible referral to Orthopedics if not improved with provider  ?Diabetes Interventions:  (Status:  Goal on track:  NO.) Long Term Goal ?Assessed patient's understanding of A1c goal: <7% ?Provided education to patient about basic DM disease process ?Reviewed medications with patient and discussed importance of medication adherence ?Counseled on importance of regular laboratory monitoring as prescribed ?Discussed plans with patient for ongoing care management follow up and provided patient with direct contact information for care management team ?Advised patient, providing education and rationale, to check cbg daily and record, calling provider for findings outside established parameters ?Referral made to social work team for assistance with caregiver stress and support group resources  ?Reviewed  how steroid shots  can make her  CBGs range higher.  Reinforced to try to eat a healthy well balanced diet when she is stressed and to get adequate sleep ?Lab Results  ?Component Value Date  ? HGBA1C 7.8 (H) 05/09/2021  ? ?Hyperlipidemia Interventions:  (Status:  Goal on track:  Yes.) Long Term Goal ?Medication review performed; medication list updated in electronic medical record.  ?Provider established cholesterol goals reviewed ?Counseled on importance of regular laboratory monitoring as prescribed ?Reviewed role and benefits of statin for  ASCVD risk reduction ?Reviewed importance of limiting foods high in cholesterol ? ?Hypertension Interventions:  (Status:  Goal on track:  Yes.) Long Term Goal ?Last practice recorded BP readings:  ?BP Reading

## 2021-07-10 NOTE — Patient Instructions (Signed)
Visit Information ? ?Thank you for taking time to visit with me today. Please don't hesitate to contact me if I can be of assistance to you before our next scheduled telephone appointment. ? ?Following are the goals we discussed today:  ?Take all medications as prescribed ?Attend all scheduled provider appointments ?Call pharmacy for medication refills 3-7 days in advance of running out of medications ?Attend church or other social activities ?Perform all self care activities independently  ?Call provider office for new concerns or questions  ?keep appointment with eye doctor ?check blood sugar at prescribed times: once daily and when you have symptoms of low or high blood sugar ?check feet daily for cuts, sores or redness ?take the blood sugar log to all doctor visits ?drink 6 to 8 glasses of water each day ?fill half of plate with vegetables ?manage portion size ?switch to sugar-free drinks ?keep feet up while sitting ?check blood pressure 3 times per week ?choose a place to take my blood pressure (home, clinic or office, retail store) ?take blood pressure log to all doctor appointments ?eat more whole grains, fruits and vegetables, lean meats and healthy fats ?limit salt intake to '2300mg'$ /day ?call for medicine refill 2 or 3 days before it runs out ?take all medications exactly as prescribed ?call doctor with any symptoms you believe are related to your medicine ? ?Our next appointment is by telephone on 08/28/21 at 11:30 AM ? ?Please call the care guide team at [phone] if you need to cancel or reschedule your appointment.  ? ?If you are experiencing a Mental Health or [place] or need someone to talk to, please call the Suicide and Crisis Lifeline: 988 ?call the [place] National Suicide Prevention Lifeline: 907-626-1601 or TTY: (918) 481-6349 TTY 724-442-5648) to talk to a trained counselor ?call 1-800-273-TALK (toll free, 24 hour hotline) ?call 911  ? ?Patient verbalizes understanding of  instructions and care plan provided today and agrees to view in [place]. Active MyChart status confirmed with patient.   ? ?Peter Garter RN, BSN,CCM, CDE ?Care Management Coordinator ?[facility] ?[phone]   ?

## 2021-07-14 DIAGNOSIS — E118 Type 2 diabetes mellitus with unspecified complications: Secondary | ICD-10-CM

## 2021-07-14 DIAGNOSIS — I10 Essential (primary) hypertension: Secondary | ICD-10-CM

## 2021-07-14 DIAGNOSIS — E782 Mixed hyperlipidemia: Secondary | ICD-10-CM

## 2021-07-16 ENCOUNTER — Other Ambulatory Visit: Payer: Self-pay | Admitting: Adult Health

## 2021-07-16 DIAGNOSIS — E782 Mixed hyperlipidemia: Secondary | ICD-10-CM

## 2021-07-28 ENCOUNTER — Ambulatory Visit (INDEPENDENT_AMBULATORY_CARE_PROVIDER_SITE_OTHER): Payer: Medicare Other | Admitting: Licensed Clinical Social Worker

## 2021-07-28 DIAGNOSIS — E118 Type 2 diabetes mellitus with unspecified complications: Secondary | ICD-10-CM

## 2021-07-28 DIAGNOSIS — F419 Anxiety disorder, unspecified: Secondary | ICD-10-CM

## 2021-07-28 DIAGNOSIS — I10 Essential (primary) hypertension: Secondary | ICD-10-CM

## 2021-07-31 DIAGNOSIS — Z20822 Contact with and (suspected) exposure to covid-19: Secondary | ICD-10-CM | POA: Diagnosis not present

## 2021-08-03 NOTE — Patient Instructions (Signed)
Visit Information ? ?Thank you for taking time to visit with me today. Please don't hesitate to contact me if I can be of assistance to you before our next scheduled telephone appointment. ? ?Following are the goals we discussed today:  ?Patient Goals/Self-Care Activities: Over the next 120 days ?Attend scheduled medical appointments ?Continue to utilize healthy coping skills and/or supportive resources provided ?Contact PCP office with any questions or concerns ? ?Our next appointment is by telephone on 09/22/21 at 2:15 PM ? ?Please call the care guide team at [phone] if you need to cancel or reschedule your appointment.  ? ?If you are experiencing a Mental Health or [place] or need someone to talk to, please call the Suicide and Crisis Lifeline: 988  ? ?Patient verbalizes understanding of instructions and care plan provided today and agrees to view in [place]. Active MyChart status confirmed with patient.   ? ?Christa See, MSW, LCSW ?[facility] ?[place] [facility] ?[email or web address] ?Phone [phone] ?9:34 AM ?  ?

## 2021-08-03 NOTE — Chronic Care Management (AMB) (Signed)
?Chronic Care Management  ? ? Clinical Social Work Note ? ?08/03/2021 ?Name: [name] MRN: [id number] DOB: 1951/02/23 ? ?[name] is a 71 y.o. year old female who is a primary care patient of [name], NP. The CCM team was consulted to assist the patient with chronic disease management and/or care coordination needs related to: Caregiver Stress.  ? ?Engaged with patient by telephone for follow up visit in response to provider referral for social work chronic care management and care coordination services.  ? ?Consent to Services:  ?The patient was given information about Chronic Care Management services, agreed to services, and gave verbal consent prior to initiation of services.  Please see initial visit note for detailed documentation.  ? ?Patient agreed to services and consent obtained.  ? ?Assessment: Review of patient past medical history, allergies, medications, and health status, including review of relevant consultants reports was performed today as part of a comprehensive evaluation and provision of chronic care management and care coordination services.    ? ?SDOH (Social Determinants of Health) assessments and interventions performed:   ? ?Advanced Directives Status: Not addressed in this encounter. ? ?CCM Care Plan ? ?Allergies  ?Allergen Reactions  ? Benadryl [Diphenhydramine Hcl] Anaphylaxis  ? Ace Inhibitors Cough  ? Amoxicillin   ? Aspirin   ? Chlorphen-Phenyleph-Methscop   ?  REACTION: rash  ? Codeine   ? Diphenhydramine Hcl   ?  REACTION: swelling-mouth  ? Fish Allergy   ? Hydrocodone   ? Indomethacin   ? Pentazocine Lactate   ?  REACTION: passing out  ? Promethazine Hcl   ? Quinine   ?  REACTION: itching ?redface  ? ? ?Outpatient Encounter Medications as of 07/28/2021  ?Medication Sig  ? acetaminophen (TYLENOL) 500 MG tablet Take 1,000 mg by mouth every 8 (eight) hours as needed.  ? aspirin 81 MG tablet Take 81 mg by mouth at bedtime.  ? atorvastatin (LIPITOR)  10 MG tablet TAKE 1 TABLET(10 MG) BY MOUTH DAILY  ? blood glucose meter kit and supplies KIT Dispense based on patient and insurance preference. Use up to four times daily as directed.  ? cholecalciferol (VITAMIN D3) 25 MCG (1000 UT) tablet Take 1,000 Units by mouth daily.  ? citalopram (CELEXA) 20 MG tablet TAKE 1 TABLET(20 MG) BY MOUTH DAILY  ? Cyanocobalamin (VITAMIN B-12 PO) Take 1 tablet by mouth daily.  ? glucose blood test strip Use to check blood glucose TID  ? Lancets 28G MISC Use to check blood glucose 4 times daily  ? levothyroxine (SYNTHROID) 88 MCG tablet TAKE 1 TABLET(88 MCG) BY MOUTH DAILY  ? losartan-hydrochlorothiazide (HYZAAR) 100-25 MG tablet TAKE 1 TABLET BY MOUTH DAILY  ? metFORMIN (GLUCOPHAGE) 500 MG tablet TAKE 1 TABLET(500 MG) BY MOUTH TWICE DAILY WITH A MEAL  ? pramipexole (MIRAPEX) 1.5 MG tablet TAKE 1 TABLET(1.5 MG) BY MOUTH AT BEDTIME  ? ?No facility-administered encounter medications on file as of 07/28/2021.  ? ? ?Patient Active Problem List  ? Diagnosis Date Noted  ? Fracture of second toe, left, closed, initial encounter 09/26/2017  ? GERD (gastroesophageal reflux disease) 10/19/2014  ? Essential hypertension 08/16/2014  ? Breast pain, right 05/20/2014  ? Restless leg syndrome 05/20/2014  ? Hip pain, bilateral 10/23/2011  ? Knee pain, bilateral 10/23/2011  ? EDEMA 01/15/2009  ? Hypothyroidism 12/11/2006  ? Diabetes type 2, uncontrolled 12/11/2006  ? DIVERTICULOSIS, COLON 12/11/2006  ? HEADACHE 12/11/2006  ? ? ?Conditions to be addressed/monitored: HTN, DMII, and  Pain ; Caregiver Stress ? ?Care Plan : LCSW Plan of Care  ?Updates made by Rebekah Chesterfield, LCSW since 08/03/2021 12:00 AM  ?  ? ?Problem: Coping Skills (General Plan of Care)   ?  ? ?Long-Range Goal: Coping Skills Enhanced   ?Start Date: 06/16/2021  ?Expected End Date: 11/13/2021  ?This Visit's Progress: On track  ?Recent Progress: On track  ?Priority: High  ?Note:   ?Current barriers:   ?Acute Mental Health needs related to  Anxiety ?Needs Support, Education, and Care Coordination in order to meet unmet mental health needs. ?Clinical Goal(s): verbalize understanding of plan for management of Anxiety   ?Clinical Interventions:  ?Assessed patient's previous and current treatment, coping skills, support system and barriers to care-Patient reports spouse's first tx went well, which has decreased anxiety symptoms ?Patient reports decrease in pain three weeks after obtaining shot ?Has a niece out of state. Emotionally supportive and a very good friend. Both have lost their spouses Daughter resides next door ?Patient was successful at identifying healthy coping skills (church, time with friend, prayer weavers) Family has received funds from cancer center through gift cards ?Strategies to strengthen boundaries with loved ones discussed ?Patient continues to participate in med management ?Mindfulness or Relaxation training provided ?Active listening / Reflection utilized  ?Emotional Support Provided ?Problem Solving /Task Center strategies reviewed ?Provided psychoeducation for mental health needs  ?Provided brief CBT  ?Caregiver stress acknowledged  ?Verbalization of feelings encouraged  ; ?Reviewed upcoming appointments ?Inter-disciplinary care team collaboration (see longitudinal plan of care) ?Patient Goals/Self-Care Activities: Over the next 120 days ?Attend scheduled medical appointments ?Continue to utilize healthy coping skills and/or supportive resources provided ?Contact PCP office with any questions or concerns ? ?  ?  ? ?Follow Up Plan: SW will follow up with patient by phone over the next 8 weeks ?     ?Christa See, MSW, LCSW ?[facility] ?[facility] ?[email or web address] ?Phone [phone] ?9:32 AM ? ? ? ?

## 2021-08-08 ENCOUNTER — Ambulatory Visit (INDEPENDENT_AMBULATORY_CARE_PROVIDER_SITE_OTHER): Payer: Medicare Other | Admitting: Adult Health

## 2021-08-08 ENCOUNTER — Encounter: Payer: Self-pay | Admitting: Adult Health

## 2021-08-08 VITALS — BP 132/82 | HR 68 | Temp 98.6°F | Ht 63.0 in | Wt 178.0 lb

## 2021-08-08 DIAGNOSIS — I10 Essential (primary) hypertension: Secondary | ICD-10-CM

## 2021-08-08 DIAGNOSIS — E118 Type 2 diabetes mellitus with unspecified complications: Secondary | ICD-10-CM

## 2021-08-08 LAB — POCT GLYCOSYLATED HEMOGLOBIN (HGB A1C): Hemoglobin A1C: 6.9 % — AB (ref 4.0–5.6)

## 2021-08-08 NOTE — Progress Notes (Signed)
? ?Subjective:  ? ? Patient ID: Jaryiah Mehlman, female    DOB: Aug 30, 1950, 71 y.o.   MRN: [id number] ? ?HPI ?71 year old female who  has a past medical history of Allergy, Anemia, Anxiety, ANXIETY DEPRESSION (09/15/2007), Arthritis, ASTHMA (05/15/2009), Asthma, DIABETES MELLITUS, TYPE II (12/11/2006), DIVERTICULOSIS, COLON (12/11/2006), Edema (01/15/2009), Gout, Headache(784.0) (12/11/2006), Hyperlipidemia, HYPERTENSION NEC (08/14/2007), HYPOTHYROIDISM (12/11/2006), PELVIC PAIN, CHRONIC (08/14/2007), and Restless leg syndrome. ? ?She presents today in follow-up regarding DM Type 2 and HTN  ? ?DM Type 2-she is currently managed with metformin 500 mg twice daily.  He does monitor her blood sugars at home with readings in the 100-150 range.  He denies symptoms of hypoglycemia. ?Lab Results  ?Component Value Date  ? HGBA1C 7.8 (H) 05/09/2021  ? ?Hypertension-managed with Hyzaar 100-25 mg daily.  She denies chest pain, shortness of breath, dizziness, lightheadedness, or headaches.  At home her blood sugars have been in the 120s to high 140s over 60s to 80s. ? ?BP Readings from Last 3 Encounters:  ?08/08/21 132/82  ?07/04/21 (!) 150/70  ?05/09/21 140/62  ? ? ?Review of Systems ?See HPI  ? ?Past Medical History:  ?Diagnosis Date  ? Allergy   ? Anemia   ? Anxiety   ? ANXIETY DEPRESSION 09/15/2007  ? Arthritis   ? ASTHMA 05/15/2009  ? Asthma   ? DIABETES MELLITUS, TYPE II 12/11/2006  ? DIVERTICULOSIS, COLON 12/11/2006  ? Edema 01/15/2009  ? Gout   ? pt denies; toe was broken  ? Headache(784.0) 12/11/2006  ? Hyperlipidemia   ? HYPERTENSION NEC 08/14/2007  ? HYPOTHYROIDISM 12/11/2006  ? PELVIC PAIN, CHRONIC 08/14/2007  ? Restless leg syndrome   ? ? ?Social History  ? ?Socioeconomic History  ? Marital status: Married  ?  Spouse name: Not on file  ? Number of children: Not on file  ? Years of education: Not on file  ? Highest education level: 12th grade  ?Occupational History  ? Not on file  ?Tobacco Use  ? Smoking status: Never  ? Smokeless  tobacco: Never  ?Substance and Sexual Activity  ? Alcohol use: Yes  ?  Alcohol/week: 0.0 standard drinks  ?  Comment: very rare  ? Drug use: No  ? Sexual activity: Not on file  ?Other Topics Concern  ? Not on file  ?Social History Narrative  ? Not on file  ? ?Social Determinants of Health  ? ?Financial Resource Strain: Medium Risk  ? Difficulty of Paying Living Expenses: Somewhat hard  ?Food Insecurity: Food Insecurity Present  ? Worried About Charity fundraiser in the Last Year: Sometimes true  ? Ran Out of Food in the Last Year: Sometimes true  ?Transportation Needs: No Transportation Needs  ? Lack of Transportation (Medical): No  ? Lack of Transportation (Non-Medical): No  ?Physical Activity: Insufficiently Active  ? Days of Exercise per Week: 2 days  ? Minutes of Exercise per Session: 10 min  ?Stress: No Stress Concern Present  ? Feeling of Stress : Only a little  ?Social Connections: Socially Integrated  ? Frequency of Communication with Friends and Family: More than three times a week  ? Frequency of Social Gatherings with Friends and Family: More than three times a week  ? Attends Religious Services: More than 4 times per year  ? Active Member of Clubs or Organizations: Yes  ? Attends Archivist Meetings: More than 4 times per year  ? Marital Status: Married  ?Intimate Partner Violence: Not At  Risk  ? Fear of Current or Ex-Partner: No  ? Emotionally Abused: No  ? Physically Abused: No  ? Sexually Abused: No  ? ? ?Past Surgical History:  ?Procedure Laterality Date  ? ABDOMINAL HYSTERECTOMY    ? APPENDECTOMY    ? Bladder Suspension    ? ? ?Family History  ?Problem Relation Age of Onset  ? COPD Mother   ?     smoker  ? Diabetes Mother   ? COPD Father   ?     smoker  ? Diabetes Father   ? Diabetes Sister   ? Diabetes Mellitus II Sister   ? Diabetes Sister   ? Diabetes Sister   ? Diabetes Sister   ? Breast cancer Other 58  ? Lung cancer Nephew 60  ? Colon cancer Neg Hx   ? Esophageal cancer Neg Hx   ?  Pancreatic cancer Neg Hx   ? ? ?Allergies  ?Allergen Reactions  ? Benadryl [Diphenhydramine Hcl] Anaphylaxis  ? Ace Inhibitors Cough  ? Amoxicillin   ? Aspirin   ? Chlorphen-Phenyleph-Methscop   ?  REACTION: rash  ? Codeine   ? Diphenhydramine Hcl   ?  REACTION: swelling-mouth  ? Fish Allergy   ? Hydrocodone   ? Indomethacin   ? Pentazocine Lactate   ?  REACTION: passing out  ? Promethazine Hcl   ? Quinine   ?  REACTION: itching ?redface  ? ? ?Current Outpatient Medications on File Prior to Visit  ?Medication Sig Dispense Refill  ? acetaminophen (TYLENOL) 500 MG tablet Take 1,000 mg by mouth every 8 (eight) hours as needed.    ? aspirin 81 MG tablet Take 81 mg by mouth at bedtime.    ? atorvastatin (LIPITOR) 10 MG tablet TAKE 1 TABLET(10 MG) BY MOUTH DAILY 90 tablet 3  ? blood glucose meter kit and supplies KIT Dispense based on patient and insurance preference. Use up to four times daily as directed. 1 each 0  ? cholecalciferol (VITAMIN D3) 25 MCG (1000 UT) tablet Take 1,000 Units by mouth daily.    ? citalopram (CELEXA) 20 MG tablet TAKE 1 TABLET(20 MG) BY MOUTH DAILY 90 tablet 1  ? Cyanocobalamin (VITAMIN B-12 PO) Take 1 tablet by mouth daily.    ? glucose blood test strip Use to check blood glucose TID 200 each 0  ? Lancets 28G MISC Use to check blood glucose 4 times daily 100 each 0  ? levothyroxine (SYNTHROID) 88 MCG tablet TAKE 1 TABLET(88 MCG) BY MOUTH DAILY 90 tablet 3  ? losartan-hydrochlorothiazide (HYZAAR) 100-25 MG tablet TAKE 1 TABLET BY MOUTH DAILY 90 tablet 1  ? metFORMIN (GLUCOPHAGE) 500 MG tablet TAKE 1 TABLET(500 MG) BY MOUTH TWICE DAILY WITH A MEAL 180 tablet 0  ? pramipexole (MIRAPEX) 1.5 MG tablet TAKE 1 TABLET(1.5 MG) BY MOUTH AT BEDTIME 90 tablet 1  ? ?No current facility-administered medications on file prior to visit.  ? ? ?BP 132/82   Pulse 68   Temp 98.6 ?F (37 ?C) (Oral)   Ht '5\' 3"'  (1.6 m)   Wt 178 lb (80.7 kg)   SpO2 98%   BMI 31.53 kg/m?  ? ? ?   ?Objective:  ? Physical  Exam ?Vitals and nursing note reviewed.  ?Constitutional:   ?   Appearance: Normal appearance.  ?Cardiovascular:  ?   Rate and Rhythm: Normal rate and regular rhythm.  ?   Pulses: Normal pulses.  ?   Heart sounds: Normal heart sounds.  ?  Pulmonary:  ?   Effort: Pulmonary effort is normal.  ?   Breath sounds: Normal breath sounds.  ?Skin: ?   General: Skin is warm and dry.  ?   Capillary Refill: Capillary refill takes less than 2 seconds.  ?Neurological:  ?   General: No focal deficit present.  ?   Mental Status: She is alert and oriented to person, place, and time.  ?Psychiatric:     ?   Mood and Affect: Mood normal.     ?   Behavior: Behavior normal.     ?   Thought Content: Thought content normal.     ?   Judgment: Judgment normal.  ? ?   ?Assessment & Plan:  ?1. Essential hypertension ?- Well controlled. Continue current medications  ? ?2. Controlled type 2 diabetes mellitus with complication, without long-term current use of insulin ([place]) ? ?- POC HgB A1c- 6.9 - has improved and at goal  ?- No change in therapy, continue Metformin  ? ?[name], NP ? ? ? ?

## 2021-08-09 DIAGNOSIS — E119 Type 2 diabetes mellitus without complications: Secondary | ICD-10-CM | POA: Diagnosis not present

## 2021-08-09 DIAGNOSIS — H43812 Vitreous degeneration, left eye: Secondary | ICD-10-CM | POA: Diagnosis not present

## 2021-08-09 DIAGNOSIS — H25813 Combined forms of age-related cataract, bilateral: Secondary | ICD-10-CM | POA: Diagnosis not present

## 2021-08-13 DIAGNOSIS — F32A Depression, unspecified: Secondary | ICD-10-CM

## 2021-08-13 DIAGNOSIS — F419 Anxiety disorder, unspecified: Secondary | ICD-10-CM

## 2021-08-13 DIAGNOSIS — E118 Type 2 diabetes mellitus with unspecified complications: Secondary | ICD-10-CM

## 2021-08-13 DIAGNOSIS — I10 Essential (primary) hypertension: Secondary | ICD-10-CM

## 2021-08-19 DIAGNOSIS — R0789 Other chest pain: Secondary | ICD-10-CM | POA: Diagnosis not present

## 2021-08-19 DIAGNOSIS — R0602 Shortness of breath: Secondary | ICD-10-CM | POA: Diagnosis not present

## 2021-08-19 DIAGNOSIS — R079 Chest pain, unspecified: Secondary | ICD-10-CM | POA: Diagnosis not present

## 2021-08-19 DIAGNOSIS — R918 Other nonspecific abnormal finding of lung field: Secondary | ICD-10-CM | POA: Diagnosis not present

## 2021-08-19 DIAGNOSIS — I10 Essential (primary) hypertension: Secondary | ICD-10-CM | POA: Diagnosis not present

## 2021-08-21 DIAGNOSIS — R079 Chest pain, unspecified: Secondary | ICD-10-CM | POA: Diagnosis not present

## 2021-08-21 DIAGNOSIS — Z20822 Contact with and (suspected) exposure to covid-19: Secondary | ICD-10-CM | POA: Diagnosis not present

## 2021-08-22 ENCOUNTER — Encounter: Payer: Self-pay | Admitting: Adult Health

## 2021-08-22 ENCOUNTER — Ambulatory Visit (INDEPENDENT_AMBULATORY_CARE_PROVIDER_SITE_OTHER): Payer: Medicare Other | Admitting: Adult Health

## 2021-08-22 VITALS — BP 140/82 | HR 88 | Temp 98.2°F | Ht 63.0 in | Wt 178.0 lb

## 2021-08-22 DIAGNOSIS — F54 Psychological and behavioral factors associated with disorders or diseases classified elsewhere: Secondary | ICD-10-CM | POA: Diagnosis not present

## 2021-08-22 DIAGNOSIS — R0602 Shortness of breath: Secondary | ICD-10-CM | POA: Diagnosis not present

## 2021-08-22 DIAGNOSIS — R0789 Other chest pain: Secondary | ICD-10-CM

## 2021-08-22 MED ORDER — ALPRAZOLAM 0.25 MG PO TABS: 0.2500 mg | ORAL_TABLET | Freq: Two times a day (BID) | ORAL | 0 refills | Status: AC | PRN

## 2021-08-22 NOTE — Progress Notes (Signed)
? ?Subjective:  ? ? Patient ID: [name], female    DOB: August 08, 1950, 71 y.o.   MRN: [id number] ? ?HPI ?71 year old female who  has a past medical history of Allergy, Anemia, Anxiety, ANXIETY DEPRESSION (09/15/2007), Arthritis, ASTHMA (05/15/2009), Asthma, DIABETES MELLITUS, TYPE II (12/11/2006), DIVERTICULOSIS, COLON (12/11/2006), Edema (01/15/2009), Gout, Headache(784.0) (12/11/2006), Hyperlipidemia, HYPERTENSION NEC (08/14/2007), HYPOTHYROIDISM (12/11/2006), PELVIC PAIN, CHRONIC (08/14/2007), and Restless leg syndrome. ? ?She presents to the office today after being seen in the emergency room 3 days ago at [facility]. ? ?He presented with chest discomfort and shortness of breath that have been ongoing for a few days prior.  Symptoms are intermittent and were located in the middle of the chest.  It is described as a tightness.  She did report that it would occasionally radiate to the left jaw.  She denied nausea or vomiting ? ?Her EKG showed sinus rhythm, rate 65.  No QRS widening, no QTc prolongation, no ST segment elevation no ischemic T wave changes. ? ?Labs were reassuring with normal white count and no emergent anemia.  BNP within normal limits.  Electrolytes were reassuring.  Initial troponin 5 and delta troponin downtrending at 4 ? ?X-ray of the chest reviewed and interpreted to show no acute abnormality ? ?CT of the chest did not show any PE or other acute process ? ?She was feeling better upon discharge and was offered admission but she declined due to having to take care of her husband.  She was advised to follow-up with her PCP outpatient for stress test. ? ?She reports today that she continues to have some mild chest tightness and shortness of breath on a nightly basis.  ? ?She continues to feel nauseated at times.  ? ?She is under a lot of stress due to her husbands health and unfavorable prognosis of bladder cancer.  ? ?Review of Systems ?See HPI  ? ?Past  Medical History:  ?Diagnosis Date  ? Allergy   ? Anemia   ? Anxiety   ? ANXIETY DEPRESSION 09/15/2007  ? Arthritis   ? ASTHMA 05/15/2009  ? Asthma   ? DIABETES MELLITUS, TYPE II 12/11/2006  ? DIVERTICULOSIS, COLON 12/11/2006  ? Edema 01/15/2009  ? Gout   ? pt denies; toe was broken  ? Headache(784.0) 12/11/2006  ? Hyperlipidemia   ? HYPERTENSION NEC 08/14/2007  ? HYPOTHYROIDISM 12/11/2006  ? PELVIC PAIN, CHRONIC 08/14/2007  ? Restless leg syndrome   ? ? ?Social History  ? ?Socioeconomic History  ? Marital status: Married  ?  Spouse name: Not on file  ? Number of children: Not on file  ? Years of education: Not on file  ? Highest education level: 12th grade  ?Occupational History  ? Not on file  ?Tobacco Use  ? Smoking status: Never  ? Smokeless tobacco: Never  ?Substance and Sexual Activity  ? Alcohol use: Yes  ?  Alcohol/week: 0.0 standard drinks  ?  Comment: very rare  ? Drug use: No  ? Sexual activity: Not on file  ?Other Topics Concern  ? Not on file  ?Social History Narrative  ? Not on file  ? ?Social Determinants of Health  ? ?Financial Resource Strain: Medium Risk  ? Difficulty of Paying Living Expenses: Somewhat hard  ?Food Insecurity: Food Insecurity Present  ? Worried About Charity fundraiser in the Last Year: Sometimes true  ? Ran Out of Food in the Last Year: Sometimes true  ?Transportation  Needs: No Transportation Needs  ? Lack of Transportation (Medical): No  ? Lack of Transportation (Non-Medical): No  ?Physical Activity: Insufficiently Active  ? Days of Exercise per Week: 2 days  ? Minutes of Exercise per Session: 10 min  ?Stress: No Stress Concern Present  ? Feeling of Stress : Only a little  ?Social Connections: Socially Integrated  ? Frequency of Communication with Friends and Family: More than three times a week  ? Frequency of Social Gatherings with Friends and Family: More than three times a week  ? Attends Religious Services: More than 4 times per year  ? Active Member of Clubs or Organizations: Yes  ?  Attends Archivist Meetings: More than 4 times per year  ? Marital Status: Married  ?Intimate Partner Violence: Not At Risk  ? Fear of Current or Ex-Partner: No  ? Emotionally Abused: No  ? Physically Abused: No  ? Sexually Abused: No  ? ? ?Past Surgical History:  ?Procedure Laterality Date  ? ABDOMINAL HYSTERECTOMY    ? APPENDECTOMY    ? Bladder Suspension    ? ? ?Family History  ?Problem Relation Age of Onset  ? COPD Mother   ?     smoker  ? Diabetes Mother   ? COPD Father   ?     smoker  ? Diabetes Father   ? Diabetes Sister   ? Diabetes Mellitus II Sister   ? Diabetes Sister   ? Diabetes Sister   ? Diabetes Sister   ? Breast cancer Other 58  ? Lung cancer Nephew 60  ? Colon cancer Neg Hx   ? Esophageal cancer Neg Hx   ? Pancreatic cancer Neg Hx   ? ? ?Allergies  ?Allergen Reactions  ? Benadryl [Diphenhydramine Hcl] Anaphylaxis  ? Ace Inhibitors Cough  ? Amoxicillin   ? Aspirin   ? Chlorphen-Phenyleph-Methscop   ?  REACTION: rash  ? Codeine   ? Diphenhydramine Hcl   ?  REACTION: swelling-mouth  ? Fish Allergy   ? Hydrocodone   ? Indomethacin   ? Pentazocine Lactate   ?  REACTION: passing out  ? Promethazine Hcl   ? Quinine   ?  REACTION: itching ?redface  ? ? ?Current Outpatient Medications on File Prior to Visit  ?Medication Sig Dispense Refill  ? acetaminophen (TYLENOL) 500 MG tablet Take 1,000 mg by mouth every 8 (eight) hours as needed.    ? aspirin 81 MG tablet Take 81 mg by mouth at bedtime.    ? atorvastatin (LIPITOR) 10 MG tablet TAKE 1 TABLET(10 MG) BY MOUTH DAILY 90 tablet 3  ? blood glucose meter kit and supplies KIT Dispense based on patient and insurance preference. Use up to four times daily as directed. 1 each 0  ? cholecalciferol (VITAMIN D3) 25 MCG (1000 UT) tablet Take 1,000 Units by mouth daily.    ? citalopram (CELEXA) 20 MG tablet TAKE 1 TABLET(20 MG) BY MOUTH DAILY 90 tablet 1  ? Cyanocobalamin (VITAMIN B-12 PO) Take 1 tablet by mouth daily.    ? glucose blood test strip Use to  check blood glucose TID 200 each 0  ? Lancets 28G MISC Use to check blood glucose 4 times daily 100 each 0  ? levothyroxine (SYNTHROID) 88 MCG tablet TAKE 1 TABLET(88 MCG) BY MOUTH DAILY 90 tablet 3  ? losartan-hydrochlorothiazide (HYZAAR) 100-25 MG tablet TAKE 1 TABLET BY MOUTH DAILY 90 tablet 1  ? metFORMIN (GLUCOPHAGE) 500 MG tablet TAKE 1 TABLET(500  MG) BY MOUTH TWICE DAILY WITH A MEAL 180 tablet 0  ? pramipexole (MIRAPEX) 1.5 MG tablet TAKE 1 TABLET(1.5 MG) BY MOUTH AT BEDTIME 90 tablet 1  ? ?No current facility-administered medications on file prior to visit.  ? ? ?BP 140/82   Pulse 88   Temp 98.2 ?F (36.8 ?C) (Oral)   Ht _0  (1.6 m)   Wt 178 lb (80.7 kg)   SpO2 96%   BMI 31.53 kg/m?  ? ? ? ?   ?Objective:  ? Physical Exam ?Vitals and nursing note reviewed.  ?Constitutional:   ?   Appearance: Normal appearance.  ?Cardiovascular:  ?   Rate and Rhythm: Normal rate and regular rhythm.  ?   Pulses: Normal pulses.  ?   Heart sounds: Normal heart sounds.  ?Pulmonary:  ?   Effort: Pulmonary effort is normal.  ?   Breath sounds: Normal breath sounds.  ?Musculoskeletal:     ?   General: Normal range of motion.  ?   Left shoulder: Tenderness present.  ?     Arms: ? ?Skin: ?   General: Skin is warm and dry.  ?   Capillary Refill: Capillary refill takes less than 2 seconds.  ?Neurological:  ?   General: No focal deficit present.  ?   Mental Status: She is alert and oriented to person, place, and time.  ? ? ? ? ? ?   ?Assessment & Plan:  ?1. Atypical chest pain ?- Possibly stress response but agree a stress test is needed to r/o cardiac condition.  ?- Exercise Tolerance Test; Future ? ?2. SOB (shortness of breath) ? ?- Exercise Tolerance Test; Future ? ?3. Stress-related physiological response affecting medical condition ?- Will add low dose xanax to her regimen of Celexa 20 mg daily.  ?- ALPRAZolam (XANAX) 0.25 MG tablet; Take 1 tablet (0.25 mg total) by mouth 2 (two) times daily as needed for anxiety.  Dispense:  60 tablet; Refill: 0 ? ?[name], NP ? ?

## 2021-08-23 NOTE — Addendum Note (Signed)
Addended by: Apolinar Junes on: 08/23/2021 07:32 AM ? ? Modules accepted: Orders ? ?

## 2021-08-24 NOTE — Patient Instructions (Signed)
Health Maintenance Due  Topic Date Due   Zoster Vaccines- Shingrix (1 of 2) Never done      Row Labels 08/08/2021    8:03 AM 05/09/2021    1:02 PM 01/02/2021    8:35 AM  Depression screen PHQ 2/9   Section Header. No data exists in this row.     Decreased Interest   0 1 0  Down, Depressed, Hopeless   0 1 0  PHQ - 2 Score   0 2 0  Altered sleeping   1 0   Tired, decreased energy   1 1   Change in appetite   0 0   Feeling bad or failure about yourself    0 0   Trouble concentrating   0 0   Moving slowly or fidgety/restless   0 0   Suicidal thoughts   0 0   PHQ-9 Score   2 3   Difficult doing work/chores   Not difficult at all      

## 2021-08-28 ENCOUNTER — Ambulatory Visit (INDEPENDENT_AMBULATORY_CARE_PROVIDER_SITE_OTHER): Payer: Medicare Other

## 2021-08-28 DIAGNOSIS — I10 Essential (primary) hypertension: Secondary | ICD-10-CM

## 2021-08-28 DIAGNOSIS — E782 Mixed hyperlipidemia: Secondary | ICD-10-CM

## 2021-08-28 DIAGNOSIS — E118 Type 2 diabetes mellitus with unspecified complications: Secondary | ICD-10-CM

## 2021-08-28 DIAGNOSIS — F419 Anxiety disorder, unspecified: Secondary | ICD-10-CM

## 2021-08-28 NOTE — Chronic Care Management (AMB) (Signed)
?Chronic Care Management  ? ?CCM RN Visit Note ? ?08/28/2021 ?Name: [name] MRN: [id number] DOB: 1950/05/23 ? ?Subjective: ?[name] is a 71 y.o. year old female who is a primary care patient of [name], NP. The care management team was consulted for assistance with disease management and care coordination needs.   ? ?Engaged with patient by telephone for follow up visit in response to provider referral for case management and/or care coordination services.  ? ?Consent to Services:  ?The patient was given information about Chronic Care Management services, agreed to services, and gave verbal consent prior to initiation of services.  Please see initial visit note for detailed documentation.  ? ?Patient agreed to services and verbal consent obtained.  ? ?Assessment: Review of patient past medical history, allergies, medications, health status, including review of consultants reports, laboratory and other test data, was performed as part of comprehensive evaluation and provision of chronic care management services.  ? ?SDOH (Social Determinants of Health) assessments and interventions performed:   ? ?CCM Care Plan ? ?Allergies  ?Allergen Reactions  ? Benadryl [Diphenhydramine Hcl] Anaphylaxis  ? Ace Inhibitors Cough  ? Amoxicillin   ? Aspirin   ? Chlorphen-Phenyleph-Methscop   ?  REACTION: rash  ? Codeine   ? Diphenhydramine Hcl   ?  REACTION: swelling-mouth  ? Fish Allergy   ? Hydrocodone   ? Indomethacin   ? Pentazocine Lactate   ?  REACTION: passing out  ? Promethazine Hcl   ? Quinine   ?  REACTION: itching ?redface  ? ? ?Outpatient Encounter Medications as of 08/28/2021  ?Medication Sig  ? acetaminophen (TYLENOL) 500 MG tablet Take 1,000 mg by mouth every 8 (eight) hours as needed.  ? ALPRAZolam (XANAX) 0.25 MG tablet Take 1 tablet (0.25 mg total) by mouth 2 (two) times daily as needed for anxiety.  ? aspirin 81 MG tablet Take 81 mg by mouth at bedtime.  ? atorvastatin  (LIPITOR) 10 MG tablet TAKE 1 TABLET(10 MG) BY MOUTH DAILY  ? blood glucose meter kit and supplies KIT Dispense based on patient and insurance preference. Use up to four times daily as directed.  ? cholecalciferol (VITAMIN D3) 25 MCG (1000 UT) tablet Take 1,000 Units by mouth daily.  ? citalopram (CELEXA) 20 MG tablet TAKE 1 TABLET(20 MG) BY MOUTH DAILY  ? Cyanocobalamin (VITAMIN B-12 PO) Take 1 tablet by mouth daily.  ? glucose blood test strip Use to check blood glucose TID  ? Lancets 28G MISC Use to check blood glucose 4 times daily  ? levothyroxine (SYNTHROID) 88 MCG tablet TAKE 1 TABLET(88 MCG) BY MOUTH DAILY  ? losartan-hydrochlorothiazide (HYZAAR) 100-25 MG tablet TAKE 1 TABLET BY MOUTH DAILY  ? metFORMIN (GLUCOPHAGE) 500 MG tablet TAKE 1 TABLET(500 MG) BY MOUTH TWICE DAILY WITH A MEAL  ? pramipexole (MIRAPEX) 1.5 MG tablet TAKE 1 TABLET(1.5 MG) BY MOUTH AT BEDTIME  ? ?No facility-administered encounter medications on file as of 08/28/2021.  ? ? ?Patient Active Problem List  ? Diagnosis Date Noted  ? Fracture of second toe, left, closed, initial encounter 09/26/2017  ? GERD (gastroesophageal reflux disease) 10/19/2014  ? Essential hypertension 08/16/2014  ? Breast pain, right 05/20/2014  ? Restless leg syndrome 05/20/2014  ? Hip pain, bilateral 10/23/2011  ? Knee pain, bilateral 10/23/2011  ? EDEMA 01/15/2009  ? Hypothyroidism 12/11/2006  ? Diabetes type 2, uncontrolled 12/11/2006  ? DIVERTICULOSIS, COLON 12/11/2006  ? HEADACHE 12/11/2006  ? ? ?Conditions to be addressed/monitored:HTN,  HLD, and DMII ? ?Care Plan : RN Care Manager Plan of Care  ?Updates made by Dimitri Ped, RN since 08/28/2021 12:00 AM  ?  ? ?Problem: Chronic Disease Management and Care Coordination Needs (DM,HTN and HLD)   ?Priority: High  ?  ? ?Long-Range Goal: Establish Plan of Care for Chronic Disease Management Needs (DM,HTN and HLD)   ?Start Date: 04/11/2021  ?Expected End Date: 04/08/2022  ?Priority: High  ?Note:   ?Current  Barriers:  ?Chronic Disease Management support and education needs related to HTN, HLD, and DMII  ?States she has been having tightness and discomfort in her chest that she went to the ED and she saw [place] last week.  States she is to have a stress test tomorrow.  States she has been handling her stress better due to her husbands bladder cancer as he has been able to do more for himself recently. States  her rt shoulder pain is better.  States her CBGs have been ranging 118-137. States she has been eating less since her husband is eating less. States she is eating more vegetables and salads.  States her B/P has been ranging 125-154/63-91.   States that she and her daughter planning a trip with her husband back to his home-place in [place] at the end of June   ?RNCM Clinical Goal(s):  ?Patient will verbalize understanding of plan for management of HTN, HLD, and DMII as evidenced by voiced adherence to plan of care ?verbalize basic understanding of  HTN, HLD, and DMII disease process and self health management plan as evidenced by voiced understanding and teach back ?take all medications exactly as prescribed and will call provider for medication related questions as evidenced by dispense report and pt verbalization ?attend all scheduled medical appointments: [name] NP 11/07/21, CCM LCSW 09/22/21 as evidenced by medical record ?demonstrate Improved adherence to prescribed treatment plan for HTN, HLD, and DMII as evidenced by readings within limits, voiced adherence to plan of care ?continue to work with RN Care Manager to address care management and care coordination needs related to  HTN, HLD, and DMII as evidenced by adherence to CM Team Scheduled appointments through collaboration with RN Care manager, provider, and care team.  ? ?Interventions: ?1:1 collaboration with primary care provider regarding development and update of comprehensive plan of care as evidenced by provider attestation and  co-signature ?Inter-disciplinary care team collaboration (see longitudinal plan of care) ?Evaluation of current treatment plan related to  self management and patient's adherence to plan as established by provider ? ? ?Pain Interventions:  (Status:  Goal on track:  Yes.) Long Term Goal ?Pain assessment performed ?Medications reviewed ?Reviewed provider established plan for pain management ?Discussed importance of adherence to all scheduled medical appointments ?Counseled on the importance of reporting any/all new or changed pain symptoms or management strategies to pain management provider ?Reviewed with patient prescribed pharmacological and nonpharmacological pain relief strategies ?Reviewed how stress can trigger pain and reviewed ways to help with stress   ?Diabetes Interventions:  (Status:  Goal on track:  Yes.) Long Term Goal ?Assessed patient's understanding of A1c goal: <7% ?Provided education to patient about basic DM disease process ?Reviewed medications with patient and discussed importance of medication adherence ?Counseled on importance of regular laboratory monitoring as prescribed ?Discussed plans with patient for ongoing care management follow up and provided patient with direct contact information for care management team ?Advised patient, providing education and rationale, to check cbg daily and record, calling provider for findings  outside established parameters ?Prasised for lowering her A1C to 6.9%. Reinforced to try to eat a healthy well balanced diet when she is stressed and to get adequate sleep ?Lab Results  ?Component Value Date  ? HGBA1C 6.9 (A) 08/08/2021  ? ?Hyperlipidemia Interventions:  (Status:  Goal on track:  Yes.) Long Term Goal ?Medication review performed; medication list updated in electronic medical record.  ?Provider established cholesterol goals reviewed ?Counseled on importance of regular laboratory monitoring as prescribed ?Reviewed role and benefits of statin for ASCVD  risk reduction ?Reviewed importance of limiting foods high in cholesterol ?Reviewed exercise goals and target of 150 minutes per week ? ?Hypertension Interventions:  (Status:  Goal on track:  Yes.) Long Term Goal ?Last practice recorded

## 2021-08-28 NOTE — Patient Instructions (Signed)
Visit Information ? ?Thank you for taking time to visit with me today. Please don't hesitate to contact me if I can be of assistance to you before our next scheduled telephone appointment. ? ?Following are the goals we discussed today:  ?Take all medications as prescribed ?Attend all scheduled provider appointments ?Call pharmacy for medication refills 3-7 days in advance of running out of medications ?Attend church or other social activities ?Perform all self care activities independently  ?Call provider office for new concerns or questions  ?keep appointment with eye doctor ?check blood sugar at prescribed times: once daily and when you have symptoms of low or high blood sugar ?check feet daily for cuts, sores or redness ?take the blood sugar log to all doctor visits ?drink 6 to 8 glasses of water each day ?fill half of plate with vegetables ?manage portion size ?switch to sugar-free drinks ?keep feet up while sitting ?check blood pressure 3 times per week ?choose a place to take my blood pressure (home, clinic or office, retail store) ?take blood pressure log to all doctor appointments ?eat more whole grains, fruits and vegetables, lean meats and healthy fats ?limit salt intake to '2300mg'$ /day ?call for medicine refill 2 or 3 days before it runs out ?take all medications exactly as prescribed ?call doctor with any symptoms you believe are related to your medicine ? ?Our next appointment is by telephone on 10/16/21 at 11:30 AM ? ?Please call the care guide team at [phone] if you need to cancel or reschedule your appointment.  ? ?If you are experiencing a Mental Health or [place] or need someone to talk to, please call the Suicide and Crisis Lifeline: 988 ?call the [place] National Suicide Prevention Lifeline: 878-578-8658 or TTY: 959-852-6187 TTY 539-794-5998) to talk to a trained counselor ?call 1-800-273-TALK (toll free, 24 hour hotline) ?call 911  ? ?Patient verbalizes understanding of  instructions and care plan provided today and agrees to view in [place]. Active MyChart status confirmed with patient.   ? ?Peter Garter RN, BSN,CCM, CDE ?Care Management Coordinator ?[facility] ?[phone]   ?

## 2021-08-29 ENCOUNTER — Ambulatory Visit (INDEPENDENT_AMBULATORY_CARE_PROVIDER_SITE_OTHER): Payer: Medicare Other

## 2021-08-29 DIAGNOSIS — R0602 Shortness of breath: Secondary | ICD-10-CM

## 2021-08-29 DIAGNOSIS — R0789 Other chest pain: Secondary | ICD-10-CM

## 2021-08-29 LAB — EXERCISE TOLERANCE TEST
Angina Index: 0
Estimated workload: 5.7
Exercise duration (min): 4 min
Exercise duration (sec): 30 s
MPHR: 151 {beats}/min
Peak HR: 134 {beats}/min
Percent HR: 88 %
RPE: 15
Rest HR: 75 {beats}/min

## 2021-08-30 ENCOUNTER — Telehealth: Payer: Self-pay | Admitting: Adult Health

## 2021-08-30 DIAGNOSIS — R9439 Abnormal result of other cardiovascular function study: Secondary | ICD-10-CM

## 2021-08-30 NOTE — Telephone Encounter (Signed)
Updated patient on her stress test which showed ST elevation. Will refer to cardiology  ?

## 2021-09-01 ENCOUNTER — Encounter: Payer: Self-pay | Admitting: Internal Medicine

## 2021-09-01 ENCOUNTER — Ambulatory Visit (INDEPENDENT_AMBULATORY_CARE_PROVIDER_SITE_OTHER): Payer: Medicare Other | Admitting: Internal Medicine

## 2021-09-01 VITALS — BP 120/70 | HR 68 | Ht 63.0 in | Wt 180.8 lb

## 2021-09-01 DIAGNOSIS — E781 Pure hyperglyceridemia: Secondary | ICD-10-CM | POA: Diagnosis not present

## 2021-09-01 DIAGNOSIS — R079 Chest pain, unspecified: Secondary | ICD-10-CM

## 2021-09-01 DIAGNOSIS — I200 Unstable angina: Secondary | ICD-10-CM | POA: Diagnosis not present

## 2021-09-01 DIAGNOSIS — I10 Essential (primary) hypertension: Secondary | ICD-10-CM

## 2021-09-01 MED ORDER — NITROGLYCERIN 0.4 MG SL SUBL: 0.4000 mg | SUBLINGUAL_TABLET | SUBLINGUAL | 3 refills | Status: DC | PRN

## 2021-09-01 MED ORDER — NITROGLYCERIN 0.4 MG SL SUBL
0.4000 mg | SUBLINGUAL_TABLET | Freq: Once | SUBLINGUAL | Status: AC
  Administered 2021-09-01: 0.4 mg via SUBLINGUAL

## 2021-09-01 NOTE — H&P (View-Only) (Signed)
Cardiology Office Note:    Date:  09/01/2021   ID:  Lollie Sails, DOB Dec 28, 1950, MRN [id number]  PCP:  [name], NP  Cardiologist:  None  Electrophysiologist:  None   Referring MD: [name], NP   Chief Complaint/Reason for Referral: Chest pressure  History of Present Illness:    [name] is a 71 y.o. female with a history of HTN, HLD, asthma, DM2, hypothyroidism who presents for evaluation of chest pressure, with ETT performed. Her daughter joins her for the visit and provides additional history.  Chest pressure - minutes at a time while resting or exertion. Substernal chest pain for a few weeks. L shoulder and into neck pain would occur at the initial onset of this pain, but not anymore. Across chest, substernal and some on right. Mostly in the evenings. Some with emotional stress. She has a significantly stressful life and notes that "May is not a good month for me". She lost several family members around the month of May and has a history of significant childhood trauma that began in the month of May as well. Her husband has stage 4 bladder cancer and she is a primary caregiver for him. They plan to travel to the [place] area of [place] this summer where they are from and take her husband who has been told that he is quite ill.   While discussing these stressful life events, the patient had an episode of unstable angina in the office that was witnessed by me and her daughter in the room. We administered 1 SL nitro 0.4 mg and this relieved her chest pain within 5 mins.  The patient denies dyspnea at rest or with exertion, palpitations, PND, orthopnea, or leg swelling. Denies cough, fever, chills. Denies nausea, vomiting. Denies syncope or presyncope. Denies dizziness or lightheadedness.     Past Medical History:  Diagnosis Date   Allergy    Anemia    Anxiety    ANXIETY DEPRESSION 09/15/2007   Arthritis    ASTHMA 05/15/2009   Asthma     DIABETES MELLITUS, TYPE II 12/11/2006   DIVERTICULOSIS, COLON 12/11/2006   Edema 01/15/2009   Gout    pt denies; toe was broken   Headache(784.0) 12/11/2006   Hyperlipidemia    HYPERTENSION NEC 08/14/2007   HYPOTHYROIDISM 12/11/2006   PELVIC PAIN, CHRONIC 08/14/2007   Restless leg syndrome     Past Surgical History:  Procedure Laterality Date   ABDOMINAL HYSTERECTOMY     APPENDECTOMY     Bladder Suspension      Current Medications: Current Meds  Medication Sig   acetaminophen (TYLENOL) 500 MG tablet Take 1,000 mg by mouth every 8 (eight) hours as needed.   ALPRAZolam (XANAX) 0.25 MG tablet Take 1 tablet (0.25 mg total) by mouth 2 (two) times daily as needed for anxiety.   aspirin 81 MG tablet Take 81 mg by mouth at bedtime.   atorvastatin (LIPITOR) 10 MG tablet TAKE 1 TABLET(10 MG) BY MOUTH DAILY   blood glucose meter kit and supplies KIT Dispense based on patient and insurance preference. Use up to four times daily as directed.   cholecalciferol (VITAMIN D3) 25 MCG (1000 UT) tablet Take 1,000 Units by mouth daily.   citalopram (CELEXA) 20 MG tablet TAKE 1 TABLET(20 MG) BY MOUTH DAILY   Cyanocobalamin (VITAMIN B-12 PO) Take 1 tablet by mouth daily.   glucose blood test strip Use to check blood glucose TID   Lancets 28G MISC Use to check blood  glucose 4 times daily   levothyroxine (SYNTHROID) 88 MCG tablet TAKE 1 TABLET(88 MCG) BY MOUTH DAILY   losartan-hydrochlorothiazide (HYZAAR) 100-25 MG tablet TAKE 1 TABLET BY MOUTH DAILY   metFORMIN (GLUCOPHAGE) 500 MG tablet TAKE 1 TABLET(500 MG) BY MOUTH TWICE DAILY WITH A MEAL   pramipexole (MIRAPEX) 1.5 MG tablet TAKE 1 TABLET(1.5 MG) BY MOUTH AT BEDTIME     Allergies:   Benadryl [diphenhydramine hcl], Ace inhibitors, Amoxicillin, Aspirin, Chlorphen-phenyleph-methscop, Codeine, Diphenhydramine hcl, Fish allergy, Hydrocodone, Indomethacin, Pentazocine lactate, Promethazine hcl, and Quinine   Social History   Tobacco Use   Smoking status:  Never   Smokeless tobacco: Never  Substance Use Topics   Alcohol use: Yes    Alcohol/week: 0.0 standard drinks    Comment: very rare   Drug use: No     Family History: The patient's family history includes Breast cancer (age of onset: 60) in an other family member; COPD in her father and mother; Diabetes in her father, mother, sister, sister, sister, and sister; Diabetes Mellitus II in her sister; Lung cancer (age of onset: 69) in her nephew. There is no history of Colon cancer, Esophageal cancer, or Pancreatic cancer.  ROS:   Please see the history of present illness.    All other systems reviewed and are negative.  EKGs/Labs/Other Studies Reviewed:    The following studies were reviewed today:  EKG:  NSR  Imaging studies that I have independently reviewed today: n/a  Recent Labs: 05/09/2021: ALT 14; BUN 15; Creatinine, Ser 0.89; Hemoglobin 12.6; Platelets 252.0; Potassium 4.1; Sodium 140; TSH 0.89  Recent Lipid Panel    Component Value Date/Time   CHOL 112 05/09/2021 1336   TRIG 180.0 (H) 05/09/2021 1336   HDL 46.50 05/09/2021 1336   CHOLHDL 2 05/09/2021 1336   VLDL 36.0 05/09/2021 1336   LDLCALC 29 05/09/2021 1336   LDLCALC 67 11/03/2019 0810   LDLDIRECT 68.0 10/29/2018 1048    Physical Exam:    VS:  BP 120/70   Pulse 68   Ht _0  (1.6 m)   Wt 180 lb 12.8 oz (82 kg)   SpO2 94%   BMI 32.03 kg/m     Wt Readings from Last 5 Encounters:  09/01/21 180 lb 12.8 oz (82 kg)  08/22/21 178 lb (80.7 kg)  08/08/21 178 lb (80.7 kg)  07/04/21 181 lb (82.1 kg)  05/09/21 179 lb 12.8 oz (81.6 kg)    Constitutional: No acute distress Eyes: sclera non-icteric, normal conjunctiva and lids ENMT: normal dentition, moist mucous membranes Cardiovascular: regular rhythm, normal rate, no murmur. S1 and S2 normal. No jugular venous distention.  Respiratory: clear to auscultation bilaterally GI : normal bowel sounds, soft and nontender. No distention.   MSK: extremities warm, well  perfused. No edema.  NEURO: grossly nonfocal exam, moves all extremities. PSYCH: alert and oriented x 3, normal mood and affect.   ASSESSMENT:    1. Chest pain of uncertain etiology   2. Unstable angina (HCC)   3. Pure hypertriglyceridemia   4. Primary hypertension    PLAN:    Chest pain of uncertain etiology - Plan: EKG 12-Lead, nitroGLYCERIN (NITROSTAT) SL tablet 0.4 mg  Unstable angina (HCC) - Plan: CANCELED: Basic metabolic panel, CANCELED: CBC  Pure hypertriglyceridemia  Primary hypertension  - episode of unstable angina in the office that was nitro responsive. Risk factors for CAD include HTN, HLD, DM2 HbA1c 6.9%. Chest pain with emotional stressors. Possible cardiac chest pain however seems most consistent with  unstable angina. Recommend cardiac catheterization. Discussed CCTA as well however her chest pain while in office was concerning for UA.   INFORMED CONSENT: I have reviewed the risks, indications, and alternatives to cardiac catheterization, possible angioplasty, and stenting with the patient. Risks include but are not limited to bleeding, infection, vascular injury, stroke, myocardial infarction, arrhythmia, kidney injury, radiation-related injury in the case of prolonged fluoroscopy use, emergency cardiac surgery, and death. The patient understands the risks of serious complication is 1-2 in 9628 with diagnostic cardiac cath and 1-2% or less with angioplasty/stenting.   - continue statin, ASA 81 mg daily, and losartan HCTZ. Med titration to be determined after cath.   Total time of encounter: 40 minutes total time of encounter, including 25 minutes spent in face-to-face patient care on the date of this encounter. This time includes coordination of care and counseling regarding above mentioned problem list. Remainder of non-face-to-face time involved reviewing chart documents/testing relevant to the patient encounter and documentation in the medical record. I have  independently reviewed documentation from referring provider.   Cherlynn Kaiser, MD, [place]   Shared Decision Making/Informed Consent:   Shared Decision Making/Informed Consent The risks [stroke (1 in 1000), death (1 in 30), kidney failure [usually temporary] (1 in 500), bleeding (1 in 200), allergic reaction [possibly serious] (1 in 200)], benefits (diagnostic support and management of coronary artery disease) and alternatives of a cardiac catheterization were discussed in detail with [name] and she is willing to proceed.   Medication Adjustments/Labs and Tests Ordered: Current medicines are reviewed at length with the patient today.  Concerns regarding medicines are outlined above.   Orders Placed This Encounter  Procedures   EKG 12-Lead    Meds ordered this encounter  Medications   nitroGLYCERIN (NITROSTAT) SL tablet 0.4 mg   nitroGLYCERIN (NITROSTAT) 0.4 MG SL tablet    Sig: Place 1 tablet (0.4 mg total) under the tongue every 5 (five) minutes as needed for chest pain.    Dispense:  25 tablet    Refill:  3    Patient Instructions  Medication Instructions:  NITROGLYCERIN 0.58m Under the tongue every 5 minutes as needed for chest pain. Do not take more than 3 doses in 15 mins   NITROGLYCERIN  is a type of vasodilator. It relaxes blood vessels, increasing the blood and oxygen supply to your heart. This medicine is used to relieve chest pain caused by angina.  In an angina attack (CHEST PAIN), you should feel better within 5 minutes after your first dose. Do not swallow whole. Place tablet under your tongue. Sit down when taking this medicine. You can take a dose every 5 minutes up to a total of 3 doses. If you do not feel better or feel worse after 1 dose, call 9-1-1 at once. Do not take more than 3 doses in 15 minutes. Do not take your medicine more often than directed.  *If you need a refill on your cardiac medications before your next  appointment, please call your pharmacy*   Testing/Procedures: Your physician has requested that you have a cardiac catheterization. Cardiac catheterization is used to diagnose and/or treat various heart conditions. Doctors may recommend this procedure for a number of different reasons. The most common reason is to evaluate chest pain. Chest pain can be a symptom of coronary artery disease (CAD), and cardiac catheterization can show whether plaque is narrowing or blocking your heart's arteries. This procedure is also used to evaluate the  valves, as well as measure the blood flow and oxygen levels in different parts of your heart. For further information please visit [facility]uge[facility][email or web address]. Please follow instruction sheet, as given.  Follow-Up: At [facility], you and your health needs are our priority.  As part of our continuing mission to provide you with exceptional heart care, we have created designated Provider Care Teams.  These Care Teams include your primary Cardiologist (physician) and Advanced Practice Providers (APPs -  Physician Assistants and Nurse Practitioners) who all work together to provide you with the care you need, when you need it.  Your next appointment:   June 8th at 10:00AM  The format for your next appointment:   In Person  Provider:   Elouise Munroe, MD    Other Instructions  [place] [address] Dept: [phone] Loc: [place]  09/01/2021  You are scheduled for a Cardiac Catheterization on Tuesday, May 23 with Dr. Shelva Majestic.  1. Please arrive at the Main Entrance A at [facility]: [place], [place] [address] at 5:30 AM (This time is two hours before your procedure to ensure your preparation). Free valet parking service is available.   Special note: Every effort is made to have your  procedure done on time. Please understand that emergencies sometimes delay scheduled procedures.  2. Diet: Do not eat solid foods after midnight.  You may have clear liquids until 5 AM upon the day of the procedure.  4. Medication instructions in preparation for your procedure:   Contrast Allergy: No  Do not take Diabetes Med Glucophage (Metformin) on the day of the procedure and HOLD 48 HOURS AFTER THE PROCEDURE.  On the morning of your procedure, take Aspirin and any morning medicines NOT listed above.  You may use sips of water.  5. Plan to go home the same day, you will only stay overnight if medically necessary. 6. You MUST have a responsible adult to drive you home. 7. An adult MUST be with you the first 24 hours after you arrive home. 8. Bring a current list of your medications, and the last time and date medication taken. 9. Bring ID and current insurance cards. 10.Please wear clothes that are easy to get on and off and wear slip-on shoes.  Thank you for allowing [place] to care for you!   -- [facility]

## 2021-09-01 NOTE — Patient Instructions (Addendum)
Medication Instructions:  NITROGLYCERIN 0.'4mg'$  Under the tongue every 5 minutes as needed for chest pain. Do not take more than 3 doses in 15 mins   NITROGLYCERIN  is a type of vasodilator. It relaxes blood vessels, increasing the blood and oxygen supply to your heart. This medicine is used to relieve chest pain caused by angina.  In an angina attack (CHEST PAIN), you should feel better within 5 minutes after your first dose. Do not swallow whole. Place tablet under your tongue. Sit down when taking this medicine. You can take a dose every 5 minutes up to a total of 3 doses. If you do not feel better or feel worse after 1 dose, call 9-1-1 at once. Do not take more than 3 doses in 15 minutes. Do not take your medicine more often than directed.  *If you need a refill on your cardiac medications before your next appointment, please call your pharmacy*   Testing/Procedures: Your physician has requested that you have a cardiac catheterization. Cardiac catheterization is used to diagnose and/or treat various heart conditions. Doctors may recommend this procedure for a number of different reasons. The most common reason is to evaluate chest pain. Chest pain can be a symptom of coronary artery disease (CAD), and cardiac catheterization can show whether plaque is narrowing or blocking your heart's arteries. This procedure is also used to evaluate the valves, as well as measure the blood flow and oxygen levels in different parts of your heart. For further information please visit HugeFiesta.tn. Please follow instruction sheet, as given.  Follow-Up: At [facility], you and your health needs are our priority.  As part of our continuing mission to provide you with exceptional heart care, we have created designated Provider Care Teams.  These Care Teams include your primary Cardiologist (physician) and Advanced Practice Providers (APPs -  Physician Assistants and Nurse Practitioners) who all work together to  provide you with the care you need, when you need it.  Your next appointment:   June 8th at 10:00AM  The format for your next appointment:   In Person  Provider:   Elouise Munroe, MD    Other Instructions  [place] [place] [address] Dept: [phone] Loc: [place]  09/01/2021  You are scheduled for a Cardiac Catheterization on Tuesday, May 23 with Dr. Shelva Majestic.  1. Please arrive at the Main Entrance A at [facility]: [place], [place] [address] at 5:30 AM (This time is two hours before your procedure to ensure your preparation). Free valet parking service is available.   Special note: Every effort is made to have your procedure done on time. Please understand that emergencies sometimes delay scheduled procedures.  2. Diet: Do not eat solid foods after midnight.  You may have clear liquids until 5 AM upon the day of the procedure.  4. Medication instructions in preparation for your procedure:   Contrast Allergy: No  Do not take Diabetes Med Glucophage (Metformin) on the day of the procedure and HOLD 48 HOURS AFTER THE PROCEDURE.  On the morning of your procedure, take Aspirin and any morning medicines NOT listed above.  You may use sips of water.  5. Plan to go home the same day, you will only stay overnight if medically necessary. 6. You MUST have a responsible adult to drive you home. 7. An adult MUST be with you the  first 24 hours after you arrive home. 8. Bring a current list of your medications, and the last time and date medication taken. 9. Bring ID and current insurance cards. 10.Please wear clothes that are easy to get on and off and wear slip-on shoes.  Thank you for allowing [place] to care for you!   -- [place] [facility]

## 2021-09-01 NOTE — Progress Notes (Signed)
Cardiology Office Note:    Date:  09/01/2021   ID:  [name], DOB 04/10/1951, MRN [id number]  PCP:  Nafziger, Cory, NP  Cardiologist:  None  Electrophysiologist:  None   Referring MD: Nafziger, Cory, NP   Chief Complaint/Reason for Referral: Chest pressure  History of Present Illness:    [name] is a 71 y.o. female with a history of HTN, HLD, asthma, DM2, hypothyroidism who presents for evaluation of chest pressure, with ETT performed. Her daughter joins her for the visit and provides additional history.  Chest pressure - minutes at a time while resting or exertion. Substernal chest pain for a few weeks. L shoulder and into neck pain would occur at the initial onset of this pain, but not anymore. Across chest, substernal and some on right. Mostly in the evenings. Some with emotional stress. She has a significantly stressful life and notes that "May is not a good month for me". She lost several family members around the month of May and has a history of significant childhood trauma that began in the month of May as well. Her husband has stage 4 bladder cancer and she is a primary caregiver for him. They plan to travel to the [place] area of [place] this summer where they are from and take her husband who has been told that he is quite ill.   While discussing these stressful life events, the patient had an episode of unstable angina in the office that was witnessed by me and her daughter in the room. We administered 1 SL nitro 0.4 mg and this relieved her chest pain within 5 mins.  The patient denies dyspnea at rest or with exertion, palpitations, PND, orthopnea, or leg swelling. Denies cough, fever, chills. Denies nausea, vomiting. Denies syncope or presyncope. Denies dizziness or lightheadedness.     Past Medical History:  Diagnosis Date   Allergy    Anemia    Anxiety    ANXIETY DEPRESSION 09/15/2007   Arthritis    ASTHMA 05/15/2009   Asthma     DIABETES MELLITUS, TYPE II 12/11/2006   DIVERTICULOSIS, COLON 12/11/2006   Edema 01/15/2009   Gout    pt denies; toe was broken   Headache(784.0) 12/11/2006   Hyperlipidemia    HYPERTENSION NEC 08/14/2007   HYPOTHYROIDISM 12/11/2006   PELVIC PAIN, CHRONIC 08/14/2007   Restless leg syndrome     Past Surgical History:  Procedure Laterality Date   ABDOMINAL HYSTERECTOMY     APPENDECTOMY     Bladder Suspension      Current Medications: Current Meds  Medication Sig   acetaminophen (TYLENOL) 500 MG tablet Take 1,000 mg by mouth every 8 (eight) hours as needed.   ALPRAZolam (XANAX) 0.25 MG tablet Take 1 tablet (0.25 mg total) by mouth 2 (two) times daily as needed for anxiety.   aspirin 81 MG tablet Take 81 mg by mouth at bedtime.   atorvastatin (LIPITOR) 10 MG tablet TAKE 1 TABLET(10 MG) BY MOUTH DAILY   blood glucose meter kit and supplies KIT Dispense based on patient and insurance preference. Use up to four times daily as directed.   cholecalciferol (VITAMIN D3) 25 MCG (1000 UT) tablet Take 1,000 Units by mouth daily.   citalopram (CELEXA) 20 MG tablet TAKE 1 TABLET(20 MG) BY MOUTH DAILY   Cyanocobalamin (VITAMIN B-12 PO) Take 1 tablet by mouth daily.   glucose blood test strip Use to check blood glucose TID   Lancets 28G MISC Use to check blood   glucose 4 times daily   levothyroxine (SYNTHROID) 88 MCG tablet TAKE 1 TABLET(88 MCG) BY MOUTH DAILY   losartan-hydrochlorothiazide (HYZAAR) 100-25 MG tablet TAKE 1 TABLET BY MOUTH DAILY   metFORMIN (GLUCOPHAGE) 500 MG tablet TAKE 1 TABLET(500 MG) BY MOUTH TWICE DAILY WITH A MEAL   pramipexole (MIRAPEX) 1.5 MG tablet TAKE 1 TABLET(1.5 MG) BY MOUTH AT BEDTIME     Allergies:   Benadryl [diphenhydramine hcl], Ace inhibitors, Amoxicillin, Aspirin, Chlorphen-phenyleph-methscop, Codeine, Diphenhydramine hcl, Fish allergy, Hydrocodone, Indomethacin, Pentazocine lactate, Promethazine hcl, and Quinine   Social History   Tobacco Use   Smoking status:  Never   Smokeless tobacco: Never  Substance Use Topics   Alcohol use: Yes    Alcohol/week: 0.0 standard drinks    Comment: very rare   Drug use: No     Family History: The patient's family history includes Breast cancer (age of onset: 58) in an other family member; COPD in her father and mother; Diabetes in her father, mother, sister, sister, sister, and sister; Diabetes Mellitus II in her sister; Lung cancer (age of onset: 60) in her nephew. There is no history of Colon cancer, Esophageal cancer, or Pancreatic cancer.  ROS:   Please see the history of present illness.    All other systems reviewed and are negative.  EKGs/Labs/Other Studies Reviewed:    The following studies were reviewed today:  EKG:  NSR  Imaging studies that I have independently reviewed today: n/a  Recent Labs: 05/09/2021: ALT 14; BUN 15; Creatinine, Ser 0.89; Hemoglobin 12.6; Platelets 252.0; Potassium 4.1; Sodium 140; TSH 0.89  Recent Lipid Panel    Component Value Date/Time   CHOL 112 05/09/2021 1336   TRIG 180.0 (H) 05/09/2021 1336   HDL 46.50 05/09/2021 1336   CHOLHDL 2 05/09/2021 1336   VLDL 36.0 05/09/2021 1336   LDLCALC 29 05/09/2021 1336   LDLCALC 67 11/03/2019 0810   LDLDIRECT 68.0 10/29/2018 1048    Physical Exam:    VS:  BP 120/70   Pulse 68   Ht 5' 3" (1.6 m)   Wt 180 lb 12.8 oz (82 kg)   SpO2 94%   BMI 32.03 kg/m     Wt Readings from Last 5 Encounters:  09/01/21 180 lb 12.8 oz (82 kg)  08/22/21 178 lb (80.7 kg)  08/08/21 178 lb (80.7 kg)  07/04/21 181 lb (82.1 kg)  05/09/21 179 lb 12.8 oz (81.6 kg)    Constitutional: No acute distress Eyes: sclera non-icteric, normal conjunctiva and lids ENMT: normal dentition, moist mucous membranes Cardiovascular: regular rhythm, normal rate, no murmur. S1 and S2 normal. No jugular venous distention.  Respiratory: clear to auscultation bilaterally GI : normal bowel sounds, soft and nontender. No distention.   MSK: extremities warm, well  perfused. No edema.  NEURO: grossly nonfocal exam, moves all extremities. PSYCH: alert and oriented x 3, normal mood and affect.   ASSESSMENT:    1. Chest pain of uncertain etiology   2. Unstable angina (HCC)   3. Pure hypertriglyceridemia   4. Primary hypertension    PLAN:    Chest pain of uncertain etiology - Plan: EKG 12-Lead, nitroGLYCERIN (NITROSTAT) SL tablet 0.4 mg  Unstable angina (HCC) - Plan: CANCELED: Basic metabolic panel, CANCELED: CBC  Pure hypertriglyceridemia  Primary hypertension  - episode of unstable angina in the office that was nitro responsive. Risk factors for CAD include HTN, HLD, DM2 HbA1c 6.9%. Chest pain with emotional stressors. Possible cardiac chest pain however seems most consistent with   unstable angina. Recommend cardiac catheterization. Discussed CCTA as well however her chest pain while in office was concerning for UA.   INFORMED CONSENT: I have reviewed the risks, indications, and alternatives to cardiac catheterization, possible angioplasty, and stenting with the patient. Risks include but are not limited to bleeding, infection, vascular injury, stroke, myocardial infarction, arrhythmia, kidney injury, radiation-related injury in the case of prolonged fluoroscopy use, emergency cardiac surgery, and death. The patient understands the risks of serious complication is 1-2 in 9628 with diagnostic cardiac cath and 1-2% or less with angioplasty/stenting.   - continue statin, ASA 81 mg daily, and losartan HCTZ. Med titration to be determined after cath.   Total time of encounter: 40 minutes total time of encounter, including 25 minutes spent in face-to-face patient care on the date of this encounter. This time includes coordination of care and counseling regarding above mentioned problem list. Remainder of non-face-to-face time involved reviewing chart documents/testing relevant to the patient encounter and documentation in the medical record. I have  independently reviewed documentation from referring provider.   Cherlynn Kaiser, MD, [place]   Shared Decision Making/Informed Consent:   Shared Decision Making/Informed Consent The risks [stroke (1 in 1000), death (1 in 30), kidney failure [usually temporary] (1 in 500), bleeding (1 in 200), allergic reaction [possibly serious] (1 in 200)], benefits (diagnostic support and management of coronary artery disease) and alternatives of a cardiac catheterization were discussed in detail with Ms. Everett and she is willing to proceed.   Medication Adjustments/Labs and Tests Ordered: Current medicines are reviewed at length with the patient today.  Concerns regarding medicines are outlined above.   Orders Placed This Encounter  Procedures   EKG 12-Lead    Meds ordered this encounter  Medications   nitroGLYCERIN (NITROSTAT) SL tablet 0.4 mg   nitroGLYCERIN (NITROSTAT) 0.4 MG SL tablet    Sig: Place 1 tablet (0.4 mg total) under the tongue every 5 (five) minutes as needed for chest pain.    Dispense:  25 tablet    Refill:  3    Patient Instructions  Medication Instructions:  NITROGLYCERIN 0.58m Under the tongue every 5 minutes as needed for chest pain. Do not take more than 3 doses in 15 mins   NITROGLYCERIN  is a type of vasodilator. It relaxes blood vessels, increasing the blood and oxygen supply to your heart. This medicine is used to relieve chest pain caused by angina.  In an angina attack (CHEST PAIN), you should feel better within 5 minutes after your first dose. Do not swallow whole. Place tablet under your tongue. Sit down when taking this medicine. You can take a dose every 5 minutes up to a total of 3 doses. If you do not feel better or feel worse after 1 dose, call 9-1-1 at once. Do not take more than 3 doses in 15 minutes. Do not take your medicine more often than directed.  *If you need a refill on your cardiac medications before your next  appointment, please call your pharmacy*   Testing/Procedures: Your physician has requested that you have a cardiac catheterization. Cardiac catheterization is used to diagnose and/or treat various heart conditions. Doctors may recommend this procedure for a number of different reasons. The most common reason is to evaluate chest pain. Chest pain can be a symptom of coronary artery disease (CAD), and cardiac catheterization can show whether plaque is narrowing or blocking your heart's arteries. This procedure is also used to evaluate the  valves, as well as measure the blood flow and oxygen levels in different parts of your heart. For further information please visit HugeFiesta.tn. Please follow instruction sheet, as given.  Follow-Up: At [facility], you and your health needs are our priority.  As part of our continuing mission to provide you with exceptional heart care, we have created designated Provider Care Teams.  These Care Teams include your primary Cardiologist (physician) and Advanced Practice Providers (APPs -  Physician Assistants and Nurse Practitioners) who all work together to provide you with the care you need, when you need it.  Your next appointment:   June 8th at 10:00AM  The format for your next appointment:   In Person  Provider:   Elouise Munroe, MD    Other Instructions  [place] [address] Dept: [phone] Loc: [place]  09/01/2021  You are scheduled for a Cardiac Catheterization on Tuesday, May 23 with Dr. Shelva Majestic.  1. Please arrive at the Main Entrance A at [facility]: [place], [place] [address] at 5:30 AM (This time is two hours before your procedure to ensure your preparation). Free valet parking service is available.   Special note: Every effort is made to have your  procedure done on time. Please understand that emergencies sometimes delay scheduled procedures.  2. Diet: Do not eat solid foods after midnight.  You may have clear liquids until 5 AM upon the day of the procedure.  4. Medication instructions in preparation for your procedure:   Contrast Allergy: No  Do not take Diabetes Med Glucophage (Metformin) on the day of the procedure and HOLD 48 HOURS AFTER THE PROCEDURE.  On the morning of your procedure, take Aspirin and any morning medicines NOT listed above.  You may use sips of water.  5. Plan to go home the same day, you will only stay overnight if medically necessary. 6. You MUST have a responsible adult to drive you home. 7. An adult MUST be with you the first 24 hours after you arrive home. 8. Bring a current list of your medications, and the last time and date medication taken. 9. Bring ID and current insurance cards. 10.Please wear clothes that are easy to get on and off and wear slip-on shoes.  Thank you for allowing [place] to care for you!   -- [place] [facility]

## 2021-09-04 ENCOUNTER — Telehealth: Payer: Self-pay | Admitting: *Deleted

## 2021-09-04 NOTE — Telephone Encounter (Signed)
Cardiac Catheterization scheduled at [facility] for: Tuesday Sep 05, 2021 7:30 AM Arrival time and place: [place] Entrance A at: 5:30 AM   Nothing to eat after midnight prior to procedure, clear liquids until 5 AM day of procedure.  Medication instructions: -Hold:  Metformin-day of procedure and 48 hours post procedure  Losartan/HCTZ-AM of procedure -Except hold medications usual morning medications can be taken with sips of water including aspirin 81 mg.  Confirmed patient has responsible adult to drive home post procedure and be with patient first 24 hours after arriving home.  Patient reports no new symptoms concerning for COVID-19/no exposure to COVID-19 in the past 10 days.  Reviewed procedure instructions with patient.

## 2021-09-05 ENCOUNTER — Ambulatory Visit (HOSPITAL_COMMUNITY)
Admission: RE | Admit: 2021-09-05 | Discharge: 2021-09-05 | Disposition: A | Discharge status: Home / Self Care | Payer: Medicare Other | Attending: Cardiovascular Disease | Admitting: Cardiovascular Disease

## 2021-09-05 ENCOUNTER — Other Ambulatory Visit: Payer: Self-pay

## 2021-09-05 ENCOUNTER — Encounter (HOSPITAL_COMMUNITY)
Admission: RE | Disposition: A | Discharge status: Home / Self Care | Payer: Self-pay | Source: Home / Self Care | Attending: Cardiovascular Disease

## 2021-09-05 ENCOUNTER — Encounter (HOSPITAL_COMMUNITY): Payer: Self-pay | Admitting: Cardiovascular Disease

## 2021-09-05 DIAGNOSIS — E781 Pure hyperglyceridemia: Secondary | ICD-10-CM | POA: Diagnosis not present

## 2021-09-05 DIAGNOSIS — I10 Essential (primary) hypertension: Secondary | ICD-10-CM | POA: Insufficient documentation

## 2021-09-05 DIAGNOSIS — I25110 Atherosclerotic heart disease of native coronary artery with unstable angina pectoris: Secondary | ICD-10-CM | POA: Diagnosis not present

## 2021-09-05 DIAGNOSIS — E039 Hypothyroidism, unspecified: Secondary | ICD-10-CM | POA: Insufficient documentation

## 2021-09-05 DIAGNOSIS — J45909 Unspecified asthma, uncomplicated: Secondary | ICD-10-CM | POA: Insufficient documentation

## 2021-09-05 DIAGNOSIS — E119 Type 2 diabetes mellitus without complications: Secondary | ICD-10-CM | POA: Insufficient documentation

## 2021-09-05 DIAGNOSIS — E785 Hyperlipidemia, unspecified: Secondary | ICD-10-CM | POA: Diagnosis not present

## 2021-09-05 DIAGNOSIS — R079 Chest pain, unspecified: Secondary | ICD-10-CM

## 2021-09-05 HISTORY — PX: LEFT HEART CATH AND CORONARY ANGIOGRAPHY: CATH118249

## 2021-09-05 LAB — GLUCOSE, CAPILLARY: Glucose-Capillary: 144 mg/dL — ABNORMAL HIGH (ref 70–99)

## 2021-09-05 SURGERY — LEFT HEART CATH AND CORONARY ANGIOGRAPHY
Anesthesia: LOCAL

## 2021-09-05 MED ORDER — HYDRALAZINE HCL 20 MG/ML IJ SOLN: 10.0000 mg | INTRAMUSCULAR | Status: DC | PRN

## 2021-09-05 MED ORDER — SODIUM CHLORIDE 0.9% FLUSH: 3.0000 mL | Freq: Two times a day (BID) | INTRAVENOUS | Status: DC

## 2021-09-05 MED ORDER — VERAPAMIL HCL 2.5 MG/ML IV SOLN
INTRAVENOUS | Status: DC | PRN
  Administered 2021-09-05: 10 mL via INTRA_ARTERIAL

## 2021-09-05 MED ORDER — HEPARIN SODIUM (PORCINE) 1000 UNIT/ML IJ SOLN
INTRAMUSCULAR | Status: AC
  Filled 2021-09-05: qty 10

## 2021-09-05 MED ORDER — IOHEXOL 350 MG/ML SOLN
INTRAVENOUS | Status: DC | PRN
Start: 2021-09-05 — End: 2021-09-05
  Administered 2021-09-05: 65 mL

## 2021-09-05 MED ORDER — HEPARIN (PORCINE) IN NACL 1000-0.9 UT/500ML-% IV SOLN
INTRAVENOUS | Status: DC | PRN
  Administered 2021-09-05 (×2): 500 mL

## 2021-09-05 MED ORDER — HEPARIN (PORCINE) IN NACL 1000-0.9 UT/500ML-% IV SOLN
INTRAVENOUS | Status: AC
  Filled 2021-09-05: qty 1000

## 2021-09-05 MED ORDER — VERAPAMIL HCL 2.5 MG/ML IV SOLN
INTRAVENOUS | Status: AC
  Filled 2021-09-05: qty 2

## 2021-09-05 MED ORDER — LIDOCAINE HCL (PF) 1 % IJ SOLN
INTRAMUSCULAR | Status: DC | PRN
Start: 2021-09-05 — End: 2021-09-05
  Administered 2021-09-05: 2 mL

## 2021-09-05 MED ORDER — SODIUM CHLORIDE 0.9 % WEIGHT BASED INFUSION: 1.0000 mL/kg/h | INTRAVENOUS | Status: DC

## 2021-09-05 MED ORDER — SODIUM CHLORIDE 0.9 % IV SOLN: 250.0000 mL | INTRAVENOUS | Status: DC | PRN

## 2021-09-05 MED ORDER — AMLODIPINE BESYLATE 5 MG PO TABS: 5.0000 mg | ORAL_TABLET | Freq: Every day | ORAL | 11 refills | Status: DC

## 2021-09-05 MED ORDER — LABETALOL HCL 5 MG/ML IV SOLN: 10.0000 mg | INTRAVENOUS | Status: DC | PRN

## 2021-09-05 MED ORDER — SODIUM CHLORIDE 0.9 % WEIGHT BASED INFUSION
3.0000 mL/kg/h | INTRAVENOUS | Status: AC
  Administered 2021-09-05: 3 mL/kg/h via INTRAVENOUS

## 2021-09-05 MED ORDER — ACETAMINOPHEN 325 MG PO TABS: 650.0000 mg | ORAL_TABLET | ORAL | Status: DC | PRN

## 2021-09-05 MED ORDER — AMLODIPINE BESYLATE 5 MG PO TABS
5.0000 mg | ORAL_TABLET | Freq: Every day | ORAL | Status: DC
Start: 2021-09-05 — End: 2021-09-05

## 2021-09-05 MED ORDER — FENTANYL CITRATE (PF) 100 MCG/2ML IJ SOLN
INTRAMUSCULAR | Status: DC | PRN
  Administered 2021-09-05: 25 ug via INTRAVENOUS

## 2021-09-05 MED ORDER — SODIUM CHLORIDE 0.9% FLUSH: 3.0000 mL | INTRAVENOUS | Status: DC | PRN

## 2021-09-05 MED ORDER — SODIUM CHLORIDE 0.9 % IV SOLN: INTRAVENOUS | Status: DC

## 2021-09-05 MED ORDER — ONDANSETRON HCL 4 MG/2ML IJ SOLN: 4.0000 mg | Freq: Four times a day (QID) | INTRAMUSCULAR | Status: DC | PRN

## 2021-09-05 MED ORDER — MIDAZOLAM HCL 2 MG/2ML IJ SOLN
INTRAMUSCULAR | Status: AC
  Filled 2021-09-05: qty 2

## 2021-09-05 MED ORDER — MIDAZOLAM HCL 2 MG/2ML IJ SOLN
INTRAMUSCULAR | Status: DC | PRN
Start: 2021-09-05 — End: 2021-09-05
  Administered 2021-09-05: 2 mg via INTRAVENOUS

## 2021-09-05 MED ORDER — ASPIRIN 81 MG PO CHEW: 81.0000 mg | CHEWABLE_TABLET | ORAL | Status: DC

## 2021-09-05 MED ORDER — HEPARIN SODIUM (PORCINE) 1000 UNIT/ML IJ SOLN
INTRAMUSCULAR | Status: DC | PRN
  Administered 2021-09-05: 4200 [IU] via INTRAVENOUS

## 2021-09-05 MED ORDER — ASPIRIN 81 MG PO CHEW: 81.0000 mg | CHEWABLE_TABLET | Freq: Every day | ORAL | Status: DC

## 2021-09-05 MED ORDER — FENTANYL CITRATE (PF) 100 MCG/2ML IJ SOLN
INTRAMUSCULAR | Status: AC
  Filled 2021-09-05: qty 2

## 2021-09-05 MED ORDER — LIDOCAINE HCL (PF) 1 % IJ SOLN
INTRAMUSCULAR | Status: AC
  Filled 2021-09-05: qty 30

## 2021-09-05 SURGICAL SUPPLY — 12 items
BAND CMPR LRG ZPHR (HEMOSTASIS) ×1
BAND ZEPHYR COMPRESS 30 LONG (HEMOSTASIS) ×1 IMPLANT
CATH INFINITI JR4 5F (CATHETERS) ×1 IMPLANT
CATH OPTITORQUE TIG 4.0 5F (CATHETERS) ×1 IMPLANT
GLIDESHEATH SLEND SS 6F .021 (SHEATH) ×1 IMPLANT
GUIDEWIRE INQWIRE 1.5J.035X260 (WIRE) IMPLANT
INQWIRE 1.5J .035X260CM (WIRE) ×2
KIT HEART LEFT (KITS) ×3 IMPLANT
PACK CARDIAC CATHETERIZATION (CUSTOM PROCEDURE TRAY) ×3 IMPLANT
SHEATH PROBE COVER 6X72 (BAG) ×1 IMPLANT
TRANSDUCER W/STOPCOCK (MISCELLANEOUS) ×3 IMPLANT
TUBING CIL FLEX 10 FLL-RA (TUBING) ×3 IMPLANT

## 2021-09-05 NOTE — Discharge Instructions (Signed)

## 2021-09-05 NOTE — Interval H&P Note (Signed)
Cath Lab Visit (complete for each Cath Lab visit)  Clinical Evaluation Leading to the Procedure:   ACS: No.  Non-ACS:    Anginal Classification: CCS II  Anti-ischemic medical therapy: No Therapy  Non-Invasive Test Results: No non-invasive testing performed  Prior CABG: No previous CABG      History and Physical Interval Note:  09/05/2021 7:38 AM  Lollie Sails  has presented today for surgery, with the diagnosis of unstable angina.  The various methods of treatment have been discussed with the patient and family. After consideration of risks, benefits and other options for treatment, the patient has consented to  Procedure(s): LEFT HEART CATH AND CORONARY ANGIOGRAPHY (N/A) as a surgical intervention.  The patient's history has been reviewed, patient examined, no change in status, stable for surgery.  I have reviewed the patient's chart and labs.  Questions were answered to the patient's satisfaction.     Shelva Majestic

## 2021-09-13 DIAGNOSIS — E785 Hyperlipidemia, unspecified: Secondary | ICD-10-CM | POA: Diagnosis not present

## 2021-09-13 DIAGNOSIS — E1169 Type 2 diabetes mellitus with other specified complication: Secondary | ICD-10-CM

## 2021-09-13 DIAGNOSIS — I10 Essential (primary) hypertension: Secondary | ICD-10-CM | POA: Diagnosis not present

## 2021-09-13 DIAGNOSIS — Z7984 Long term (current) use of oral hypoglycemic drugs: Secondary | ICD-10-CM

## 2021-09-21 ENCOUNTER — Ambulatory Visit: Payer: Medicare Other | Admitting: Licensed Clinical Social Worker

## 2021-09-21 ENCOUNTER — Encounter: Payer: Self-pay | Admitting: Internal Medicine

## 2021-09-21 ENCOUNTER — Ambulatory Visit (INDEPENDENT_AMBULATORY_CARE_PROVIDER_SITE_OTHER): Payer: Medicare Other | Admitting: Internal Medicine

## 2021-09-21 VITALS — BP 126/74 | HR 59 | Ht 63.0 in | Wt 182.6 lb

## 2021-09-21 DIAGNOSIS — E781 Pure hyperglyceridemia: Secondary | ICD-10-CM | POA: Diagnosis not present

## 2021-09-21 DIAGNOSIS — I200 Unstable angina: Secondary | ICD-10-CM

## 2021-09-21 DIAGNOSIS — F419 Anxiety disorder, unspecified: Secondary | ICD-10-CM

## 2021-09-21 DIAGNOSIS — R011 Cardiac murmur, unspecified: Secondary | ICD-10-CM | POA: Diagnosis not present

## 2021-09-21 DIAGNOSIS — R079 Chest pain, unspecified: Secondary | ICD-10-CM

## 2021-09-21 DIAGNOSIS — I10 Essential (primary) hypertension: Secondary | ICD-10-CM | POA: Diagnosis not present

## 2021-09-21 DIAGNOSIS — E118 Type 2 diabetes mellitus with unspecified complications: Secondary | ICD-10-CM

## 2021-09-21 NOTE — Progress Notes (Signed)
Cardiology Office Note:    Date:  09/21/2021   ID:  [name], DOB 15-Jul-1950, MRN [id number]  PCP:  [name], NP  Cardiologist:  Elouise Munroe, MD  Electrophysiologist:  None   Referring MD: [name], NP   Chief Complaint/Reason for Referral: Chest pressure  History of Present Illness:    [name] is a 71 y.o. female with a history of HTN, HLD, asthma, DM2, hypothyroidism who presents for evaluation of chest pressure, with ETT performed.  Presents with her husband today. They are both in good spirits and anticipating their trip to [place] this summer. We discussed results of cath which showed mild nonobstructive CAD and elevated LVEDP. Amlodipine added for possible vasospasm/endothelial dysfunction. We discussed keeping nitro on hand going forward especially on her trip in the event she has emotionally triggered chest pain since nitro helps. Infrequent chest pain episodes since cath.   The patient denies dyspnea at rest or with exertion, palpitations, PND, orthopnea, or leg swelling. Denies cough, fever, chills. Denies nausea, vomiting. Denies syncope or presyncope. Denies dizziness or lightheadedness.     Past Medical History:  Diagnosis Date   Allergy    Anemia    Anxiety    ANXIETY DEPRESSION 09/15/2007   Arthritis    ASTHMA 05/15/2009   Asthma    DIABETES MELLITUS, TYPE II 12/11/2006   DIVERTICULOSIS, COLON 12/11/2006   Edema 01/15/2009   Gout    pt denies; toe was broken   Headache(784.0) 12/11/2006   Hyperlipidemia    HYPERTENSION NEC 08/14/2007   HYPOTHYROIDISM 12/11/2006   PELVIC PAIN, CHRONIC 08/14/2007   Restless leg syndrome     Past Surgical History:  Procedure Laterality Date   ABDOMINAL HYSTERECTOMY     APPENDECTOMY     Bladder Suspension     LEFT HEART CATH AND CORONARY ANGIOGRAPHY N/A 09/05/2021   Procedure: LEFT HEART CATH AND CORONARY ANGIOGRAPHY;  Surgeon: Troy Sine, MD;  Location: [facility];   Service: Cardiovascular;  Laterality: N/A;    Current Medications: Current Meds  Medication Sig   acetaminophen (TYLENOL) 500 MG tablet Take 1,000 mg by mouth every 8 (eight) hours as needed for moderate pain.   ALPRAZolam (XANAX) 0.25 MG tablet Take 1 tablet (0.25 mg total) by mouth 2 (two) times daily as needed for anxiety.   amLODipine (NORVASC) 5 MG tablet Take 1 tablet (5 mg total) by mouth daily.   aspirin 81 MG tablet Take 81 mg by mouth at bedtime.   atorvastatin (LIPITOR) 10 MG tablet TAKE 1 TABLET(10 MG) BY MOUTH DAILY   blood glucose meter kit and supplies KIT Dispense based on patient and insurance preference. Use up to four times daily as directed.   Carboxymethylcellul-Glycerin (LUBRICATING EYE DROPS OP) Place 1 drop into both eyes daily as needed (dry eyes).   cholecalciferol (VITAMIN D3) 25 MCG (1000 UT) tablet Take 1,000 Units by mouth daily.   citalopram (CELEXA) 20 MG tablet TAKE 1 TABLET(20 MG) BY MOUTH DAILY (Patient taking differently: Take 20 mg by mouth at bedtime.)   Emollient (GOLD BOND DIABETICS DRY SKIN) CREA Apply 1 application. topically at bedtime.   glucose blood test strip Use to check blood glucose TID   Lancets 28G MISC Use to check blood glucose 4 times daily   levothyroxine (SYNTHROID) 88 MCG tablet TAKE 1 TABLET(88 MCG) BY MOUTH DAILY   Lidocaine 4 % AERO Apply 1 spray topically at bedtime as needed (foot pain).   losartan-hydrochlorothiazide (  HYZAAR) 100-25 MG tablet TAKE 1 TABLET BY MOUTH DAILY   metFORMIN (GLUCOPHAGE) 500 MG tablet TAKE 1 TABLET(500 MG) BY MOUTH TWICE DAILY WITH A MEAL   nitroGLYCERIN (NITROSTAT) 0.4 MG SL tablet Place 1 tablet (0.4 mg total) under the tongue every 5 (five) minutes as needed for chest pain.   pramipexole (MIRAPEX) 1.5 MG tablet TAKE 1 TABLET(1.5 MG) BY MOUTH AT BEDTIME   vitamin B-12 (CYANOCOBALAMIN) 1000 MCG tablet Take 1,000 mcg by mouth daily.     Allergies:   Benadryl [diphenhydramine hcl], Ace inhibitors,  Amoxicillin, Aspirin, Codeine, Fish allergy, Hydrocodone, Indomethacin, Pentazocine lactate, Promethazine hcl, Quinine, and Chlorphen-phenyleph-methscop   Social History   Tobacco Use   Smoking status: Never   Smokeless tobacco: Never  Substance Use Topics   Alcohol use: Yes    Alcohol/week: 0.0 standard drinks of alcohol    Comment: very rare   Drug use: No     Family History: The patient's family history includes Breast cancer (age of onset: 23) in an other family member; COPD in her father and mother; Diabetes in her father, mother, sister, sister, sister, and sister; Diabetes Mellitus II in her sister; Lung cancer (age of onset: 55) in her nephew. There is no history of Colon cancer, Esophageal cancer, or Pancreatic cancer.  ROS:   Please see the history of present illness.    All other systems reviewed and are negative.  EKGs/Labs/Other Studies Reviewed:    The following studies were reviewed today:  EKG:  sinus bradycardia rate 59  Imaging studies that I have independently reviewed today: cardiac catheterization 09/05/21 - mild CAD in LAD  Recent Labs: 05/09/2021: ALT 14; BUN 15; Creatinine, Ser 0.89; Hemoglobin 12.6; Platelets 252.0; Potassium 4.1; Sodium 140; TSH 0.89  Recent Lipid Panel    Component Value Date/Time   CHOL 112 05/09/2021 1336   TRIG 180.0 (H) 05/09/2021 1336   HDL 46.50 05/09/2021 1336   CHOLHDL 2 05/09/2021 1336   VLDL 36.0 05/09/2021 1336   LDLCALC 29 05/09/2021 1336   LDLCALC 67 11/03/2019 0810   LDLDIRECT 68.0 10/29/2018 1048    Physical Exam:    VS:  BP 126/74   Pulse (!) 59   Ht '5\' 3"'  (1.6 m)   Wt 182 lb 9.6 oz (82.8 kg)   SpO2 96%   BMI 32.35 kg/m     Wt Readings from Last 5 Encounters:  09/21/21 182 lb 9.6 oz (82.8 kg)  09/05/21 184 lb (83.5 kg)  09/01/21 180 lb 12.8 oz (82 kg)  08/22/21 178 lb (80.7 kg)  08/08/21 178 lb (80.7 kg)    Constitutional: No acute distress Eyes: sclera non-icteric, normal conjunctiva and  lids ENMT: normal dentition, moist mucous membranes Cardiovascular: regular rhythm, normal rate, 1/6 HSM L precordium. S1 and S2 normal. No jugular venous distention.  Respiratory: clear to auscultation bilaterally GI : normal bowel sounds, soft and nontender. No distention.   MSK: extremities warm, well perfused. No edema.  NEURO: grossly nonfocal exam, moves all extremities. PSYCH: alert and oriented x 3, normal mood and affect.   ASSESSMENT:    1. Murmur   2. Chest pain of uncertain etiology   3. Unstable angina ([place])   4. Pure hypertriglyceridemia   5. Primary hypertension     PLAN:    Murmur - Plan: EKG 12-Lead, ECHOCARDIOGRAM COMPLETE  Chest pain of uncertain etiology  Unstable angina (HCC)  Pure hypertriglyceridemia  Primary hypertension  - episode of unstable angina in the office that  was nitro responsive. Risk factors for CAD include HTN, HLD, DM2 HbA1c 6.9%. Chest pain with emotional stressors. Cath showed mild CAD and presumed coronary vasospasm as source of chest pain. Started on amlodipine with improvement in symptoms and she can continue prn nitro.  - continue statin, ASA 81 mg daily, and losartan HCTZ.  - systolic murmur, will obtain echocardiogram with next follow up given patient's plans for travel.   Total time of encounter: 30 minutes total time of encounter, including 20 minutes spent in face-to-face patient care on the date of this encounter. This time includes coordination of care and counseling regarding above mentioned problem list. Remainder of non-face-to-face time involved reviewing chart documents/testing relevant to the patient encounter and documentation in the medical record. I have independently reviewed documentation from referring provider.   Cherlynn Kaiser, MD, [place]    Shared Decision Making/Informed Consent:     Medication Adjustments/Labs and Tests Ordered: Current medicines are reviewed at length with the  patient today.  Concerns regarding medicines are outlined above.   Orders Placed This Encounter  Procedures   EKG 12-Lead   ECHOCARDIOGRAM COMPLETE    No orders of the defined types were placed in this encounter.   Patient Instructions  Medication Instructions:  No Changes In Medications at this time.  *If you need a refill on your cardiac medications before your next appointment, please call your pharmacy*  Testing/Procedures: Your physician has requested that you have an echocardiogram IN 6 MONTHS. Echocardiography is a painless test that uses sound waves to create images of your heart. It provides your doctor with information about the size and shape of your heart and how well your heart's chambers and valves are working. You may receive an ultrasound enhancing agent through an IV if needed to better visualize your heart during the echo.This procedure takes approximately one hour. There are no restrictions for this procedure. This will take place at the [address], [address].   Follow-Up: At [facility], you and your health needs are our priority.  As part of our continuing mission to provide you with exceptional heart care, we have created designated Provider Care Teams.  These Care Teams include your primary Cardiologist (physician) and Advanced Practice Providers (APPs -  Physician Assistants and Nurse Practitioners) who all work together to provide you with the care you need, when you need it.  Your next appointment:   6 month(s)  The format for your next appointment:   In Person  Provider:   Elouise Munroe, MD

## 2021-09-21 NOTE — Patient Instructions (Addendum)
Medication Instructions:  No Changes In Medications at this time.  *If you need a refill on your cardiac medications before your next appointment, please call your pharmacy*  Testing/Procedures: Your physician has requested that you have an echocardiogram IN 6 MONTHS. Echocardiography is a painless test that uses sound waves to create images of your heart. It provides your doctor with information about the size and shape of your heart and how well your heart's chambers and valves are working. You may receive an ultrasound enhancing agent through an IV if needed to better visualize your heart during the echo.This procedure takes approximately one hour. There are no restrictions for this procedure. This will take place at the [address], [address].   Follow-Up: At [facility], you and your health needs are our priority.  As part of our continuing mission to provide you with exceptional heart care, we have created designated Provider Care Teams.  These Care Teams include your primary Cardiologist (physician) and Advanced Practice Providers (APPs -  Physician Assistants and Nurse Practitioners) who all work together to provide you with the care you need, when you need it.  Your next appointment:   6 month(s)  The format for your next appointment:   In Person  Provider:   Elouise Munroe, MD

## 2021-09-22 ENCOUNTER — Telehealth: Payer: Medicare Other

## 2021-09-26 NOTE — Chronic Care Management (AMB) (Signed)
Chronic Care Management    Clinical Social Work Note  09/26/2021 Name: Aleecia Tapia MRN: [id number] DOB: 1950/04/24  Rithika Seel is a 71 y.o. year old female who is a primary care patient of [name], NP. The CCM team was consulted to assist the patient with chronic disease management and/or care coordination needs related to: Caregiver Stress.   Engaged with patient by telephone for follow up visit in response to provider referral for social work chronic care management and care coordination services.   Consent to Services:  The patient was given information about Chronic Care Management services, agreed to services, and gave verbal consent prior to initiation of services.  Please see initial visit note for detailed documentation.   Patient agreed to services and consent obtained.   Assessment: Review of patient past medical history, allergies, medications, and health status, including review of relevant consultants reports was performed today as part of a comprehensive evaluation and provision of chronic care management and care coordination services.     SDOH (Social Determinants of Health) assessments and interventions performed:    Advanced Directives Status: Not addressed in this encounter.  CCM Care Plan  Allergies  Allergen Reactions   Benadryl [Diphenhydramine Hcl] Anaphylaxis   Ace Inhibitors Cough   Amoxicillin     Unknown reaction   Aspirin Nausea And Vomiting    Tolerates low dose aspirin    Codeine Nausea And Vomiting   Fish Allergy Nausea And Vomiting   Hydrocodone Nausea And Vomiting   Indomethacin     Unknown reaction    Pentazocine Lactate     passing out   Promethazine Hcl     Unknown reaction    Quinine     itching redface   Chlorphen-Phenyleph-Methscop Rash    Outpatient Encounter Medications as of 09/21/2021  Medication Sig   acetaminophen (TYLENOL) 500 MG tablet Take 1,000 mg by mouth every 8 (eight) hours as needed  for moderate pain.   ALPRAZolam (XANAX) 0.25 MG tablet Take 1 tablet (0.25 mg total) by mouth 2 (two) times daily as needed for anxiety.   amLODipine (NORVASC) 5 MG tablet Take 1 tablet (5 mg total) by mouth daily.   aspirin 81 MG tablet Take 81 mg by mouth at bedtime.   atorvastatin (LIPITOR) 10 MG tablet TAKE 1 TABLET(10 MG) BY MOUTH DAILY   blood glucose meter kit and supplies KIT Dispense based on patient and insurance preference. Use up to four times daily as directed.   Carboxymethylcellul-Glycerin (LUBRICATING EYE DROPS OP) Place 1 drop into both eyes daily as needed (dry eyes).   cholecalciferol (VITAMIN D3) 25 MCG (1000 UT) tablet Take 1,000 Units by mouth daily.   citalopram (CELEXA) 20 MG tablet TAKE 1 TABLET(20 MG) BY MOUTH DAILY (Patient taking differently: Take 20 mg by mouth at bedtime.)   Emollient (GOLD BOND DIABETICS DRY SKIN) CREA Apply 1 application. topically at bedtime.   glucose blood test strip Use to check blood glucose TID   Lancets 28G MISC Use to check blood glucose 4 times daily   levothyroxine (SYNTHROID) 88 MCG tablet TAKE 1 TABLET(88 MCG) BY MOUTH DAILY   Lidocaine 4 % AERO Apply 1 spray topically at bedtime as needed (foot pain).   losartan-hydrochlorothiazide (HYZAAR) 100-25 MG tablet TAKE 1 TABLET BY MOUTH DAILY   metFORMIN (GLUCOPHAGE) 500 MG tablet TAKE 1 TABLET(500 MG) BY MOUTH TWICE DAILY WITH A MEAL   nitroGLYCERIN (NITROSTAT) 0.4 MG SL tablet Place 1 tablet (0.4 mg total)  under the tongue every 5 (five) minutes as needed for chest pain.   pramipexole (MIRAPEX) 1.5 MG tablet TAKE 1 TABLET(1.5 MG) BY MOUTH AT BEDTIME   vitamin B-12 (CYANOCOBALAMIN) 1000 MCG tablet Take 1,000 mcg by mouth daily.   No facility-administered encounter medications on file as of 09/21/2021.    Patient Active Problem List   Diagnosis Date Noted   Chest pain of uncertain etiology    Fracture of second toe, left, closed, initial encounter 09/26/2017   GERD (gastroesophageal  reflux disease) 10/19/2014   Essential hypertension 08/16/2014   Breast pain, right 05/20/2014   Restless leg syndrome 05/20/2014   Hip pain, bilateral 10/23/2011   Knee pain, bilateral 10/23/2011   EDEMA 01/15/2009   Hypothyroidism 12/11/2006   Diabetes type 2, uncontrolled 12/11/2006   DIVERTICULOSIS, COLON 12/11/2006   HEADACHE 12/11/2006    Conditions to be addressed/monitored: HTN and DMII; Caregiver Stress  Care Plan : LCSW Plan of Care  Updates made by Rebekah Chesterfield, LCSW since 09/26/2021 12:00 AM     Problem: Coping Skills (General Plan of Care)      Long-Range Goal: Coping Skills Enhanced   Start Date: 06/16/2021  Expected End Date: 11/13/2021  This Visit's Progress: On track  Recent Progress: On track  Priority: High  Note:   Current barriers:   Acute Mental Health needs related to Anxiety Needs Support, Education, and Care Coordination in order to meet unmet mental health needs. Clinical Goal(s): verbalize understanding of plan for management of Anxiety   Clinical Interventions:  Assessed patient's previous and current treatment, coping skills, support system and barriers to care-Patient reports spouse's first tx went well, which has decreased anxiety symptoms Patient reports a good report from cardiologist and compliance with new prescribed meds Patient reports, "I'm taking it day by day" She is looking forward to vacation to visit family this month CCM LCSW reviewed upcoming appts Has a niece out of state. Emotionally supportive and a very good friend. Both have lost their spouses Daughter resides next door Patient was successful at identifying healthy coping skills (church, time with friend, prayer weavers) Family has received funds from cancer center through gift cards Strategies to assist with stress management discussed. Family practices gratitude thinking Patient continues to participate in med management Mindfulness or Relaxation training provided Active  listening / Reflection utilized  Emotional Support Provided Problem [place] strategies reviewed Provided psychoeducation for mental health needs  Provided brief CBT  Caregiver stress acknowledged  Verbalization of feelings encouraged  ; Reviewed upcoming appointments Inter-disciplinary care team collaboration (see longitudinal plan of care) Patient Goals/Self-Care Activities: Over the next 120 days Attend scheduled medical appointments Continue to utilize healthy coping skills and/or supportive resources provided Contact PCP office with any questions or concerns       Follow Up Plan: Appointment scheduled for SW follow up with client by phone on: 12/14/21  Christa See, MSW, [facility] [email or web address] Phone [phone] 10:07 AM

## 2021-09-26 NOTE — Patient Instructions (Signed)
Visit Information  Thank you for taking time to visit with me today. Please don't hesitate to contact me if I can be of assistance to you before our next scheduled telephone appointment.  Following are the goals we discussed today:  Patient Goals/Self-Care Activities: Over the next 120 days Attend scheduled medical appointments Continue to utilize healthy coping skills and/or supportive resources provided Contact PCP office with any questions or concerns  Our next appointment is by telephone on 12/14/21 at 1:15 PM  Please call the care guide team at [phone] if you need to cancel or reschedule your appointment.   If you are experiencing a Mental Health or [place] or need someone to talk to, please call the Suicide and Crisis Lifeline: 988 call 911   Patient verbalizes understanding of instructions and care plan provided today and agrees to view in [place]. Active MyChart status and patient understanding of how to access instructions and care plan via MyChart confirmed with patient.     Christa See, MSW, [facility] [email or web address] Phone [phone] 10:08 AM

## 2021-10-16 ENCOUNTER — Ambulatory Visit (INDEPENDENT_AMBULATORY_CARE_PROVIDER_SITE_OTHER): Payer: Medicare Other

## 2021-10-16 DIAGNOSIS — R0789 Other chest pain: Secondary | ICD-10-CM

## 2021-10-16 DIAGNOSIS — E118 Type 2 diabetes mellitus with unspecified complications: Secondary | ICD-10-CM

## 2021-10-16 DIAGNOSIS — E782 Mixed hyperlipidemia: Secondary | ICD-10-CM

## 2021-10-16 DIAGNOSIS — I10 Essential (primary) hypertension: Secondary | ICD-10-CM

## 2021-10-16 NOTE — Chronic Care Management (AMB) (Signed)
Chronic Care Management   CCM RN Visit Note  10/16/2021 Name: [name] MRN: [id number] DOB: February 17, 1951  Subjective: [name] is a 71 y.o. year old female who is a primary care patient of [name], NP. The care management team was consulted for assistance with disease management and care coordination needs.    Engaged with patient by telephone for follow up visit in response to provider referral for case management and/or care coordination services.   Consent to Services:  The patient was given information about Chronic Care Management services, agreed to services, and gave verbal consent prior to initiation of services.  Please see initial visit note for detailed documentation.   Patient agreed to services and verbal consent obtained.   Assessment: Review of patient past medical history, allergies, medications, health status, including review of consultants reports, laboratory and other test data, was performed as part of comprehensive evaluation and provision of chronic care management services.   SDOH (Social Determinants of Health) assessments and interventions performed:    CCM Care Plan  Allergies  Allergen Reactions   Benadryl [Diphenhydramine Hcl] Anaphylaxis   Ace Inhibitors Cough   Amoxicillin     Unknown reaction   Aspirin Nausea And Vomiting    Tolerates low dose aspirin    Codeine Nausea And Vomiting   Fish Allergy Nausea And Vomiting   Hydrocodone Nausea And Vomiting   Indomethacin     Unknown reaction    Pentazocine Lactate     passing out   Promethazine Hcl     Unknown reaction    Quinine     itching redface   Chlorphen-Phenyleph-Methscop Rash    Outpatient Encounter Medications as of 10/16/2021  Medication Sig   acetaminophen (TYLENOL) 500 MG tablet Take 1,000 mg by mouth every 8 (eight) hours as needed for moderate pain.   ALPRAZolam (XANAX) 0.25 MG tablet Take 1 tablet (0.25 mg total) by mouth 2 (two) times daily as  needed for anxiety.   amLODipine (NORVASC) 5 MG tablet Take 1 tablet (5 mg total) by mouth daily.   aspirin 81 MG tablet Take 81 mg by mouth at bedtime.   atorvastatin (LIPITOR) 10 MG tablet TAKE 1 TABLET(10 MG) BY MOUTH DAILY   blood glucose meter kit and supplies KIT Dispense based on patient and insurance preference. Use up to four times daily as directed.   Carboxymethylcellul-Glycerin (LUBRICATING EYE DROPS OP) Place 1 drop into both eyes daily as needed (dry eyes).   cholecalciferol (VITAMIN D3) 25 MCG (1000 UT) tablet Take 1,000 Units by mouth daily.   citalopram (CELEXA) 20 MG tablet TAKE 1 TABLET(20 MG) BY MOUTH DAILY (Patient taking differently: Take 20 mg by mouth at bedtime.)   Emollient (GOLD BOND DIABETICS DRY SKIN) CREA Apply 1 application. topically at bedtime.   glucose blood test strip Use to check blood glucose TID   Lancets 28G MISC Use to check blood glucose 4 times daily   levothyroxine (SYNTHROID) 88 MCG tablet TAKE 1 TABLET(88 MCG) BY MOUTH DAILY   Lidocaine 4 % AERO Apply 1 spray topically at bedtime as needed (foot pain).   losartan-hydrochlorothiazide (HYZAAR) 100-25 MG tablet TAKE 1 TABLET BY MOUTH DAILY   metFORMIN (GLUCOPHAGE) 500 MG tablet TAKE 1 TABLET(500 MG) BY MOUTH TWICE DAILY WITH A MEAL   nitroGLYCERIN (NITROSTAT) 0.4 MG SL tablet Place 1 tablet (0.4 mg total) under the tongue every 5 (five) minutes as needed for chest pain.   pramipexole (MIRAPEX) 1.5 MG tablet TAKE  1 TABLET(1.5 MG) BY MOUTH AT BEDTIME   vitamin B-12 (CYANOCOBALAMIN) 1000 MCG tablet Take 1,000 mcg by mouth daily.   No facility-administered encounter medications on file as of 10/16/2021.    Patient Active Problem List   Diagnosis Date Noted   Chest pain of uncertain etiology    Fracture of second toe, left, closed, initial encounter 09/26/2017   GERD (gastroesophageal reflux disease) 10/19/2014   Essential hypertension 08/16/2014   Breast pain, right 05/20/2014   Restless leg syndrome  05/20/2014   Hip pain, bilateral 10/23/2011   Knee pain, bilateral 10/23/2011   EDEMA 01/15/2009   Hypothyroidism 12/11/2006   Diabetes type 2, uncontrolled 12/11/2006   DIVERTICULOSIS, COLON 12/11/2006   HEADACHE 12/11/2006    Conditions to be addressed/monitored:CAD, HTN, HLD, and DMII  Care Plan : RN Care Manager Plan of Care  Updates made by Dimitri Ped, RN since 10/16/2021 12:00 AM     Problem: Chronic Disease Management and Care Coordination Needs (DM,HTN and HLD)   Priority: High     Long-Range Goal: Establish Plan of Care for Chronic Disease Management Needs (DM,HTN and HLD)   Start Date: 04/11/2021  Expected End Date: 04/08/2022  Priority: High  Note:   Current Barriers:  Chronic Disease Management support and education needs related to HTN, HLD, and DMII  States she has her heart cath and she had an appointment with cardiology on 09/21/21.  States they are going to treat her unstable angina with medications.  States she had to take her NTG twice while she was on her trip with her family to [place].  States she also has some swelling in her legs since she got home on Saturday.   States her husband is starting to be in pain from his cancer and they are to see his cancer doctor later this week to discuss his options.   States her CBGs have been ranging 103-153. States she has been eating less since her husband is eating less. States she is eating more vegetables and salads but they did eat not as healthy on their trip.  States her B/P has been in the 140-150/69-80 range.   States her family and friends have been helping her cope with her stress.  RNCM Clinical Goal(s):  Patient will verbalize understanding of plan for management of HTN, HLD, and DMII as evidenced by voiced adherence to plan of care verbalize basic understanding of  HTN, HLD, and DMII disease process and self health management plan as evidenced by voiced understanding and teach back take all medications  exactly as prescribed and will call provider for medication related questions as evidenced by dispense report and pt verbalization attend all scheduled medical appointments: [name] NP 11/07/21, CCM LCSW 12/14/21, cardiology 03/27/22 as evidenced by medical record demonstrate Improved adherence to prescribed treatment plan for HTN, HLD, and DMII as evidenced by readings within limits, voiced adherence to plan of care continue to work with RN Care Manager to address care management and care coordination needs related to  HTN, HLD, and DMII as evidenced by adherence to CM Team Scheduled appointments through collaboration with RN Care manager, provider, and care team.   Interventions: 1:1 collaboration with primary care provider regarding development and update of comprehensive plan of care as evidenced by provider attestation and co-signature Inter-disciplinary care team collaboration (see longitudinal plan of care) Evaluation of current treatment plan related to  self management and patient's adherence to plan as established by provider   CAD Interventions: (Status:  New goal.) Long Term Goal Assessed understanding of CAD diagnosis Medications reviewed including medications utilized in CAD treatment plan Provided education on importance of blood pressure control in management of CAD Provided education on Importance of limiting foods high in cholesterol Reviewed Importance of taking all medications as prescribed Reviewed Importance of attending all scheduled provider appointments Reviewed use of NTG and when to call 911.  Reviewed to notify provider if swelling in legs does not improve since she is home or if it worsens Pain Interventions:  (Status:  Goal on track:  Yes.) Long Term Goal Pain assessment performed Medications reviewed Reviewed provider established plan for pain management Discussed importance of adherence to all scheduled medical appointments Counseled on the importance of  reporting any/all new or changed pain symptoms or management strategies to pain management provider Reviewed with patient prescribed pharmacological and nonpharmacological pain relief strategies Reinforced how stress can trigger pain and reviewed ways to help with stress   Diabetes Interventions:  (Status:  Goal on track:  Yes.) Long Term Goal Assessed patient's understanding of A1c goal: <7% Provided education to patient about basic DM disease process Reviewed medications with patient and discussed importance of medication adherence Counseled on importance of regular laboratory monitoring as prescribed Discussed plans with patient for ongoing care management follow up and provided patient with direct contact information for care management team Advised patient, providing education and rationale, to check cbg daily and record, calling provider for findings outside established parameters  Reinforced to try to eat a healthy well balanced diet when she is stressed and to get adequate sleep Lab Results  Component Value Date   HGBA1C 6.9 (A) 08/08/2021   Hyperlipidemia Interventions:  (Status:  Goal on track:  Yes.) Long Term Goal Medication review performed; medication list updated in electronic medical record.  Provider established cholesterol goals reviewed Counseled on importance of regular laboratory monitoring as prescribed Reviewed role and benefits of statin for ASCVD risk reduction Reviewed importance of limiting foods high in cholesterol Reviewed exercise goals and target of 150 minutes per week  Hypertension Interventions:  (Status:  Goal on track:  Yes.) Long Term Goal Last practice recorded BP readings:  BP Readings from Last 3 Encounters:  09/21/21 126/74  09/05/21 139/68  09/01/21 120/70  Most recent eGFR/CrCl: No results found for: EGFR  No components found for: CRCL  Evaluation of current treatment plan related to hypertension self management and patient's adherence to plan  as established by provider Provided education to patient re: stroke prevention, s/s of heart attack and stroke Reviewed medications with patient and discussed importance of compliance Discussed plans with patient for ongoing care management follow up and provided patient with direct contact information for care management team Provided education on prescribed diet low sodium low CHO Reinforced  importance of controlling stress to help with B/P.  Reinforced to try taking a short walk when possible to help with B/P and stress    Patient Goals/Self-Care Activities: Take all medications as prescribed Attend all scheduled provider appointments Call pharmacy for medication refills 3-7 days in advance of running out of medications Attend church or other social activities Perform all self care activities independently  Call provider office for new concerns or questions  keep appointment with eye doctor check blood sugar at prescribed times: once daily and when you have symptoms of low or high blood sugar check feet daily for cuts, sores or redness take the blood sugar log to all doctor visits drink 6 to 8 glasses  of water each day fill half of plate with vegetables manage portion size switch to sugar-free drinks keep feet up while sitting check blood pressure 3 times per week choose a place to take my blood pressure (home, clinic or office, retail store) take blood pressure log to all doctor appointments eat more whole grains, fruits and vegetables, lean meats and healthy fats limit salt intake to 2352m/day call for medicine refill 2 or 3 days before it runs out take all medications exactly as prescribed call doctor with any symptoms you believe are related to your medicine  Follow Up Plan:  Telephone follow up appointment with care management team member scheduled for:  11/14/21 The patient has been provided with contact information for the care management team and has been advised to call  with any health related questions or concerns.       Plan:Telephone follow up appointment with care management team member scheduled for:  11/14/21 The patient has been provided with contact information for the care management team and has been advised to call with any health related questions or concerns.  MPeter GarterRN, BJackquline Denmark CDE Care Management Coordinator [facility] ([phone]

## 2021-10-16 NOTE — Patient Instructions (Signed)
Visit Information  Thank you for taking time to visit with me today. Please don't hesitate to contact me if I can be of assistance to you before our next scheduled telephone appointment.  Following are the goals we discussed today:  Take all medications as prescribed Attend all scheduled provider appointments Call pharmacy for medication refills 3-7 days in advance of running out of medications Attend church or other social activities Perform all self care activities independently  Call provider office for new concerns or questions  keep appointment with eye doctor check blood sugar at prescribed times: once daily and when you have symptoms of low or high blood sugar check feet daily for cuts, sores or redness take the blood sugar log to all doctor visits drink 6 to 8 glasses of water each day fill half of plate with vegetables manage portion size switch to sugar-free drinks keep feet up while sitting check blood pressure 3 times per week choose a place to take my blood pressure (home, clinic or office, retail store) take blood pressure log to all doctor appointments eat more whole grains, fruits and vegetables, lean meats and healthy fats limit salt intake to '2300mg'$ /day call for medicine refill 2 or 3 days before it runs out take all medications exactly as prescribed call doctor with any symptoms you believe are related to your medicine Angina  Angina is discomfort in the chest, neck, arm, jaw, or back. The discomfort is caused by a lack of blood in the middle layer of the heart wall (myocardium). There are four types of angina: Stable angina. This is triggered by vigorous activity or exercise. It goes away when you rest or take medicines that treat angina. This is diagnosed if you have had the symptom for more than 2 months. Unstable angina. This is a warning sign and can lead to a heart attack. This is a medical emergency. Symptoms come at rest and last a long time. Microvascular  angina. This affects the small coronary arteries. Symptoms include chest pain, feeling tired, and being short of breath. The symptoms can last a long time or short time. Prinzmetal or variant angina. This is caused by a spasm of the arteries that go to your heart. What are the causes? This condition is usually caused by atherosclerosis. This is the buildup of fat and cholesterol (plaque) in your arteries. The plaque may narrow or block the artery. Other causes of angina include: Sudden spasms of the muscles of the arteries in the heart. Small artery disease (microvascular dysfunction). Problems with any of your heart valves. A tear in an artery in your heart (coronary artery dissection). Weakness of the heart muscle (cardiomyopathy). What increases the risk? You are more likely to develop this condition if you have: High cholesterol. High blood pressure. Diabetes. A family history of heart disease. A sedentary lifestyle, or a lifestyle in which you do not exercise enough. Depression. Had radiation treatment to the left side of your chest. Other risk factors include: Using tobacco. Being obese. Eating a diet high in saturated fats. Being exposed to high stress or triggers of stress. Using drugs, such as cocaine. Women have a greater risk for angina if they: Are older than age 59. Have gone through menopause. What are the signs or symptoms? Common symptoms of this condition in both men and women may include: Chest pain, which may: Feel like a crushing or squeezing in the chest, or a tightness, pressure, fullness, or heaviness in the chest. Last for more than  a few minutes, or stop and come back over a few minutes. Pain in the neck, arm, jaw, or back. Unexplained heartburn or indigestion. Shortness of breath. Nausea. Sudden cold sweats. Women and people with diabetes may have unusual (atypical) symptoms, such as: Fatigue. Unexplained feelings of nervousness or  anxiety. Unexplained weakness. Dizziness or fainting. How is this diagnosed? This condition may be diagnosed based on: Your symptoms and medical history. Electrocardiogram (ECG) to measure the electrical activity in your heart. Blood tests. Stress test to look for signs of blockage when your heart is stressed. CT angiogram to examine your heart and the blood flow to it. Coronary angiogram to check for arterial blockage. Echocardiogram (ultrasound) to assess the strength of your heartbeat. How is this treated? Angina may be treated with: Medicines to: Prevent blood clots and heart attack. Relax blood vessels and improve blood flow to the heart. Reduce blood pressure, improve heart pumping, and relax blood vessels spasms. Reduce cholesterol and help treat atherosclerosis. A procedure to widen a narrowed or blocked coronary artery (angioplasty). A mesh tube (stent) may be placed in a coronary artery to keep it open. Surgery to allow blood to go around a blocked artery (coronary artery bypass surgery). Follow these instructions at home: Medicines Take over-the-counter and prescription medicines only as told by your health care provider. Do not take the following medicines unless your health care provider approves: NSAIDs, such as ibuprofen or naproxen. Vitamin supplements that contain vitamin A, vitamin E, or both. Hormone replacement therapy that contains estrogen with or without progestin. Eating and drinking  Eat a heart-healthy diet. This includes plenty of fresh fruits and vegetables, whole grains, low-fat (lean) protein, and low-fat dairy products. Follow instructions from your health care provider about eating or drinking restrictions. Activity Follow an exercise program approved by your health care provider. Consider joining a cardiac rehabilitation program. Take a break when you feel fatigued. Plan rest periods in your daily activities. Lifestyle  Do not use any products  that contain nicotine or tobacco. These products include cigarettes, chewing tobacco, and vaping devices, such as e-cigarettes. If you need help quitting, ask your health care provider. If your health care provider says you can drink alcohol: Limit how much you have to: 0-1 drink a day for women who are not pregnant. 0-2 drinks a day for men. Be aware of how much alcohol is in your drink. In the [place] one drink equals one 12 oz bottle of beer (355 mL), one 5 oz glass of wine (148 mL), or one 1 oz glass of hard liquor (44 mL). General instructions Maintain a healthy weight. Learn to manage stress. Keep your vaccinations up to date. Get the flu (influenza) vaccine every year. Talk to your health care provider if you feel depressed. Take a depression screening test to see if you are at risk for depression. Work with your health care provider to manage other health conditions, such as hypertension or diabetes. Keep all follow-up visits. This is important. Get help right away if: You have pain in your chest, neck, arm, jaw, or back, and the pain: Lasts more than a few minutes. Is recurring. Is not relieved by taking medicines under the tongue (sublingual nitroglycerin). Increases in intensity or frequency. You have a lot of sweating without cause. You have unexplained: Heartburn or indigestion. Shortness of breath or difficulty breathing. Nausea or vomiting. Fatigue. Feelings of nervousness or anxiety. Weakness. You have sudden light-headedness or dizziness. You faint. These symptoms may represent a serious  problem that is an emergency. Do not wait to see if the symptoms will go away. Get medical help right away. Call your local emergency services (911 in the [place] Do not drive yourself to the hospital. Summary Angina is discomfort in the chest, neck, arm, jaw, or back that is caused by a lack of blood in the arteries of the heart wall. There are many symptoms of angina. They include  chest pain, unexplained heartburn or indigestion, sudden cold sweats, and fatigue. Angina may be treated with lifestyle changes, medicines, or surgery. Symptoms of angina may represent an emergency. Get medical help right away. Call your local emergency services (911 in the [place] Do not drive yourself to the hospital. This information is not intended to replace advice given to you by your health care provider. Make sure you discuss any questions you have with your health care provider. Document Revised: 09/25/2019 Document Reviewed: 09/25/2019 Elsevier Patient Education  [place] next appointment is by telephone on 11/14/21 at 11:30 AM  Please call the care guide team at [phone] if you need to cancel or reschedule your appointment.   If you are experiencing a Mental Health or [place] or need someone to talk to, please call the Suicide and Crisis Lifeline: 988 call the [place] National Suicide Prevention Lifeline: (906)495-7356 or TTY: 956 351 5478 TTY 548-717-2295) to talk to a trained counselor call 1-800-273-TALK (toll free, 24 hour hotline) call 911   Patient verbalizes understanding of instructions and care plan provided today and agrees to view in [place]. Active MyChart status and patient understanding of how to access instructions and care plan via MyChart confirmed with patient.     Peter Garter RN, Jackquline Denmark, CDE Care Management Coordinator [facility] [phone]

## 2021-10-19 ENCOUNTER — Other Ambulatory Visit: Payer: Self-pay | Admitting: Adult Health

## 2021-10-19 DIAGNOSIS — E118 Type 2 diabetes mellitus with unspecified complications: Secondary | ICD-10-CM

## 2021-10-19 DIAGNOSIS — Z760 Encounter for issue of repeat prescription: Secondary | ICD-10-CM

## 2021-10-20 ENCOUNTER — Encounter: Payer: Self-pay | Admitting: Adult Health

## 2021-11-07 ENCOUNTER — Ambulatory Visit (INDEPENDENT_AMBULATORY_CARE_PROVIDER_SITE_OTHER): Payer: Medicare Other | Admitting: Adult Health

## 2021-11-07 ENCOUNTER — Encounter: Payer: Self-pay | Admitting: Adult Health

## 2021-11-07 VITALS — BP 128/80 | HR 69 | Temp 98.7°F | Ht 63.0 in | Wt 182.0 lb

## 2021-11-07 DIAGNOSIS — I200 Unstable angina: Secondary | ICD-10-CM

## 2021-11-07 DIAGNOSIS — I10 Essential (primary) hypertension: Secondary | ICD-10-CM | POA: Diagnosis not present

## 2021-11-07 DIAGNOSIS — E118 Type 2 diabetes mellitus with unspecified complications: Secondary | ICD-10-CM | POA: Diagnosis not present

## 2021-11-07 LAB — POCT GLYCOSYLATED HEMOGLOBIN (HGB A1C): Hemoglobin A1C: 7 % — AB (ref 4.0–5.6)

## 2021-11-07 NOTE — Progress Notes (Signed)
Subjective:    Patient ID: [name], female    DOB: Mar 31, 1951, 71 y.o.   MRN: [id number]  HPI  71 year old female who  has a past medical history of Allergy, Anemia, Anxiety, ANXIETY DEPRESSION (09/15/2007), Arthritis, ASTHMA (05/15/2009), Asthma, DIABETES MELLITUS, TYPE II (12/11/2006), DIVERTICULOSIS, COLON (12/11/2006), Edema (01/15/2009), Gout, Headache(784.0) (12/11/2006), Hyperlipidemia, HYPERTENSION NEC (08/14/2007), HYPOTHYROIDISM (12/11/2006), PELVIC PAIN, CHRONIC (08/14/2007), and Restless leg syndrome.  She presents to the office today for 65-monthfollow-up regarding diabetes mellitus type 2 and hypertension  Diabetes mellitus type 2-managed with metformin 500 mg twice daily.  She does note to her blood sugars at home with readings in the 100-1 50 range.  She denies episodes of hypoglycemia Lab Results  Component Value Date   HGBA1C 6.9 (A) 08/08/2021   Hypertension-managed with Hyzaar 100-25 mg daily.  She denies chest pain, shortness of breath, dizziness, lightheadedness, or headaches.  She does monitor her blood pressures at home with readings in the 120s to 140s over 60s to 80s BP Readings from Last 3 Encounters:  11/07/21 128/80  09/21/21 126/74  09/05/21 139/68    Review of Systems See HPI   Past Medical History:  Diagnosis Date   Allergy    Anemia    Anxiety    ANXIETY DEPRESSION 09/15/2007   Arthritis    ASTHMA 05/15/2009   Asthma    DIABETES MELLITUS, TYPE II 12/11/2006   DIVERTICULOSIS, COLON 12/11/2006   Edema 01/15/2009   Gout    pt denies; toe was broken   Headache(784.0) 12/11/2006   Hyperlipidemia    HYPERTENSION NEC 08/14/2007   HYPOTHYROIDISM 12/11/2006   PELVIC PAIN, CHRONIC 08/14/2007   Restless leg syndrome     Social History   Socioeconomic History   Marital status: Married    Spouse name: Not on file   Number of children: Not on file   Years of education: Not on file   Highest education level: 12th grade  Occupational History    Not on file  Tobacco Use   Smoking status: Never   Smokeless tobacco: Never  Substance and Sexual Activity   Alcohol use: Yes    Alcohol/week: 0.0 standard drinks of alcohol    Comment: very rare   Drug use: No   Sexual activity: Not on file  Other Topics Concern   Not on file  Social History Narrative   Not on file   Social Determinants of Health   Financial Resource Strain: Medium Risk (03/28/2021)   Overall Financial Resource Strain (CARDIA)    Difficulty of Paying Living Expenses: Somewhat hard  Food Insecurity: Food Insecurity Present (03/28/2021)   Hunger Vital Sign    Worried About Running Out of Food in the Last Year: Sometimes true    Ran Out of Food in the Last Year: Sometimes true  Transportation Needs: No Transportation Needs (03/28/2021)   PRAPARE - THydrologist(Medical): No    Lack of Transportation (Non-Medical): No  Physical Activity: Insufficiently Active (03/28/2021)   Exercise Vital Sign    Days of Exercise per Week: 2 days    Minutes of Exercise per Session: 10 min  Stress: No Stress Concern Present (03/28/2021)   [place]   Feeling of Stress : Only a little  Social Connections: Socially Integrated (03/28/2021)   Social Connection and Isolation Panel [NHANES]    Frequency of Communication with Friends and Family: More than three  times a week    Frequency of Social Gatherings with Friends and Family: More than three times a week    Attends Religious Services: More than 4 times per year    Active Member of Clubs or Organizations: Yes    Attends Archivist Meetings: More than 4 times per year    Marital Status: Married  Human resources officer Violence: Not At Risk (01/02/2021)   Humiliation, Afraid, Rape, and Kick questionnaire    Fear of Current or Ex-Partner: No    Emotionally Abused: No    Physically Abused: No    Sexually Abused: No    Past  Surgical History:  Procedure Laterality Date   ABDOMINAL HYSTERECTOMY     APPENDECTOMY     Bladder Suspension     LEFT HEART CATH AND CORONARY ANGIOGRAPHY N/A 09/05/2021   Procedure: LEFT HEART CATH AND CORONARY ANGIOGRAPHY;  Surgeon: Troy Sine, MD;  Location: [facility];  Service: Cardiovascular;  Laterality: N/A;    Family History  Problem Relation Age of Onset   COPD Mother        smoker   Diabetes Mother    COPD Father        smoker   Diabetes Father    Diabetes Sister    Diabetes Mellitus II Sister    Diabetes Sister    Diabetes Sister    Diabetes Sister    Breast cancer Other 39   Lung cancer Nephew 60   Colon cancer Neg Hx    Esophageal cancer Neg Hx    Pancreatic cancer Neg Hx     Allergies  Allergen Reactions   Benadryl [Diphenhydramine Hcl] Anaphylaxis   Ace Inhibitors Cough   Amoxicillin     Unknown reaction   Aspirin Nausea And Vomiting    Tolerates low dose aspirin    Codeine Nausea And Vomiting   Fish Allergy Nausea And Vomiting   Hydrocodone Nausea And Vomiting   Indomethacin     Unknown reaction    Pentazocine Lactate     passing out   Promethazine Hcl     Unknown reaction    Quinine     itching redface   Chlorphen-Phenyleph-Methscop Rash    Current Outpatient Medications on File Prior to Visit  Medication Sig Dispense Refill   acetaminophen (TYLENOL) 500 MG tablet Take 1,000 mg by mouth every 8 (eight) hours as needed for moderate pain.     ALPRAZolam (XANAX) 0.25 MG tablet Take 1 tablet (0.25 mg total) by mouth 2 (two) times daily as needed for anxiety. 60 tablet 0   amLODipine (NORVASC) 5 MG tablet Take 1 tablet (5 mg total) by mouth daily. 30 tablet 11   aspirin 81 MG tablet Take 81 mg by mouth at bedtime.     atorvastatin (LIPITOR) 10 MG tablet TAKE 1 TABLET(10 MG) BY MOUTH DAILY 90 tablet 3   blood glucose meter kit and supplies KIT Dispense based on patient and insurance preference. Use up to four times daily as directed.  1 each 0   Carboxymethylcellul-Glycerin (LUBRICATING EYE DROPS OP) Place 1 drop into both eyes daily as needed (dry eyes).     cholecalciferol (VITAMIN D3) 25 MCG (1000 UT) tablet Take 1,000 Units by mouth daily.     citalopram (CELEXA) 20 MG tablet TAKE 1 TABLET(20 MG) BY MOUTH DAILY (Patient taking differently: Take 20 mg by mouth at bedtime.) 90 tablet 1   Emollient (GOLD BOND DIABETICS DRY SKIN) CREA Apply 1 application.  topically at bedtime.     glucose blood test strip Use to check blood glucose TID 200 each 0   Lancets 28G MISC Use to check blood glucose 4 times daily 100 each 0   levothyroxine (SYNTHROID) 88 MCG tablet TAKE 1 TABLET(88 MCG) BY MOUTH DAILY 90 tablet 3   Lidocaine 4 % AERO Apply 1 spray topically at bedtime as needed (foot pain).     losartan-hydrochlorothiazide (HYZAAR) 100-25 MG tablet TAKE 1 TABLET BY MOUTH DAILY 90 tablet 1   metFORMIN (GLUCOPHAGE) 500 MG tablet TAKE 1 TABLET(500 MG) BY MOUTH TWICE DAILY WITH A MEAL 180 tablet 0   nitroGLYCERIN (NITROSTAT) 0.4 MG SL tablet Place 1 tablet (0.4 mg total) under the tongue every 5 (five) minutes as needed for chest pain. 25 tablet 3   pramipexole (MIRAPEX) 1.5 MG tablet TAKE 1 TABLET(1.5 MG) BY MOUTH AT BEDTIME 90 tablet 1   vitamin B-12 (CYANOCOBALAMIN) 1000 MCG tablet Take 1,000 mcg by mouth daily.     No current facility-administered medications on file prior to visit.    There were no vitals taken for this visit.      Objective:   Physical Exam Vitals and nursing note reviewed.  Constitutional:      Appearance: Normal appearance.  Cardiovascular:     Rate and Rhythm: Normal rate and regular rhythm.     Pulses: Normal pulses.     Heart sounds: Normal heart sounds.  Pulmonary:     Effort: Pulmonary effort is normal.     Breath sounds: Normal breath sounds.  Musculoskeletal:        General: Normal range of motion.     Right lower leg: Edema (trace pitting) present.     Left lower leg: Edema (trace pitting)  present.  Skin:    General: Skin is warm and dry.  Neurological:     General: No focal deficit present.     Mental Status: She is alert and oriented to person, place, and time.  Psychiatric:        Mood and Affect: Mood normal.        Behavior: Behavior normal.        Thought Content: Thought content normal.       Assessment & Plan:  1. Essential hypertension - Well controlled. No change in medications   2. Controlled type 2 diabetes mellitus with complication, without long-term current use of insulin (HCC)  - POC HgB A1c- 7.0  - No change in medications  - Follow up in 6 months for CPE or sooner if needed  [name], NP

## 2021-11-07 NOTE — Patient Instructions (Signed)
Health Maintenance Due  Topic Date Due   Zoster Vaccines- Shingrix (1 of 2) Never done      Row Labels 08/08/2021    8:03 AM 05/09/2021    1:02 PM 01/02/2021    8:35 AM  Depression screen PHQ 2/9   Section Header. No data exists in this row.     Decreased Interest   0 1 0  Down, Depressed, Hopeless   0 1 0  PHQ - 2 Score   0 2 0  Altered sleeping   1 0   Tired, decreased energy   1 1   Change in appetite   0 0   Feeling bad or failure about yourself    0 0   Trouble concentrating   0 0   Moving slowly or fidgety/restless   0 0   Suicidal thoughts   0 0   PHQ-9 Score   2 3   Difficult doing work/chores   Not difficult at all

## 2021-11-13 DIAGNOSIS — E782 Mixed hyperlipidemia: Secondary | ICD-10-CM | POA: Diagnosis not present

## 2021-11-13 DIAGNOSIS — E118 Type 2 diabetes mellitus with unspecified complications: Secondary | ICD-10-CM | POA: Diagnosis not present

## 2021-11-13 DIAGNOSIS — I10 Essential (primary) hypertension: Secondary | ICD-10-CM | POA: Diagnosis not present

## 2021-11-14 ENCOUNTER — Ambulatory Visit: Payer: Medicare Other

## 2021-11-14 DIAGNOSIS — E118 Type 2 diabetes mellitus with unspecified complications: Secondary | ICD-10-CM

## 2021-11-14 DIAGNOSIS — I10 Essential (primary) hypertension: Secondary | ICD-10-CM

## 2021-11-14 DIAGNOSIS — E782 Mixed hyperlipidemia: Secondary | ICD-10-CM

## 2021-11-14 NOTE — Patient Instructions (Signed)
Visit Information RNCM Case closed goals met Thank you for allowing me to share the care management and care coordination services that are available to you as part of your health plan and services through your primary care provider and medical home. Please reach out to me at [phone] if the care management/care coordination team may be of assistance to you in the future.   Alajah Witman RN, BSN,CCM, CDE Care Management Coordinator [facility] [phone]   

## 2021-11-14 NOTE — Chronic Care Management (AMB) (Signed)
Chronic Care Management   CCM RN Visit Note  11/14/2021 Name: [name] MRN: [id number] DOB: February 07, 1951  Subjective: [name] is a 71 y.o. year old female who is a primary care patient of [name], NP. The care management team was consulted for assistance with disease management and care coordination needs.    Engaged with patient by telephone for follow up visit in response to provider referral for case management and/or care coordination services.   Consent to Services:  The patient was given information about Chronic Care Management services, agreed to services, and gave verbal consent prior to initiation of services.  Please see initial visit note for detailed documentation.   Patient agreed to services and verbal consent obtained.   Assessment: Review of patient past medical history, allergies, medications, health status, including review of consultants reports, laboratory and other test data, was performed as part of comprehensive evaluation and provision of chronic care management services.   SDOH (Social Determinants of Health) assessments and interventions performed:    CCM Care Plan  Allergies  Allergen Reactions   Benadryl [Diphenhydramine Hcl] Anaphylaxis   Ace Inhibitors Cough   Amoxicillin     Unknown reaction   Aspirin Nausea And Vomiting    Tolerates low dose aspirin    Codeine Nausea And Vomiting   Fish Allergy Nausea And Vomiting   Hydrocodone Nausea And Vomiting   Indomethacin     Unknown reaction    Pentazocine Lactate     passing out   Promethazine Hcl     Unknown reaction    Quinine     itching redface   Chlorphen-Phenyleph-Methscop Rash    Outpatient Encounter Medications as of 11/14/2021  Medication Sig   acetaminophen (TYLENOL) 500 MG tablet Take 1,000 mg by mouth every 8 (eight) hours as needed for moderate pain.   ALPRAZolam (XANAX) 0.25 MG tablet Take 1 tablet (0.25 mg total) by mouth 2 (two) times daily as  needed for anxiety.   amLODipine (NORVASC) 5 MG tablet Take 1 tablet (5 mg total) by mouth daily.   aspirin 81 MG tablet Take 81 mg by mouth at bedtime.   atorvastatin (LIPITOR) 10 MG tablet TAKE 1 TABLET(10 MG) BY MOUTH DAILY   blood glucose meter kit and supplies KIT Dispense based on patient and insurance preference. Use up to four times daily as directed.   Carboxymethylcellul-Glycerin (LUBRICATING EYE DROPS OP) Place 1 drop into both eyes daily as needed (dry eyes).   cholecalciferol (VITAMIN D3) 25 MCG (1000 UT) tablet Take 1,000 Units by mouth daily.   citalopram (CELEXA) 20 MG tablet TAKE 1 TABLET(20 MG) BY MOUTH DAILY (Patient taking differently: Take 20 mg by mouth at bedtime.)   Emollient (GOLD BOND DIABETICS DRY SKIN) CREA Apply 1 application. topically at bedtime.   glucose blood test strip Use to check blood glucose TID   Lancets 28G MISC Use to check blood glucose 4 times daily   levothyroxine (SYNTHROID) 88 MCG tablet TAKE 1 TABLET(88 MCG) BY MOUTH DAILY   Lidocaine 4 % AERO Apply 1 spray topically at bedtime as needed (foot pain).   losartan-hydrochlorothiazide (HYZAAR) 100-25 MG tablet TAKE 1 TABLET BY MOUTH DAILY   metFORMIN (GLUCOPHAGE) 500 MG tablet TAKE 1 TABLET(500 MG) BY MOUTH TWICE DAILY WITH A MEAL   nitroGLYCERIN (NITROSTAT) 0.4 MG SL tablet Place 1 tablet (0.4 mg total) under the tongue every 5 (five) minutes as needed for chest pain.   pramipexole (MIRAPEX) 1.5 MG tablet TAKE  1 TABLET(1.5 MG) BY MOUTH AT BEDTIME   vitamin B-12 (CYANOCOBALAMIN) 1000 MCG tablet Take 1,000 mcg by mouth daily.   No facility-administered encounter medications on file as of 11/14/2021.    Patient Active Problem List   Diagnosis Date Noted   Chest pain of uncertain etiology    Fracture of second toe, left, closed, initial encounter 09/26/2017   GERD (gastroesophageal reflux disease) 10/19/2014   Essential hypertension 08/16/2014   Breast pain, right 05/20/2014   Restless leg syndrome  05/20/2014   Hip pain, bilateral 10/23/2011   Knee pain, bilateral 10/23/2011   EDEMA 01/15/2009   Hypothyroidism 12/11/2006   Diabetes type 2, uncontrolled 12/11/2006   DIVERTICULOSIS, COLON 12/11/2006   HEADACHE 12/11/2006    Conditions to be addressed/monitored:CAD, HTN, HLD, and DMII  Care Plan : RN Care Manager Plan of Care  Updates made by Dimitri Ped, RN since 11/14/2021 12:00 AM  Completed 11/14/2021   Problem: Chronic Disease Management and Care Coordination Needs (DM,HTN and HLD) Resolved 11/14/2021  Priority: High     Long-Range Goal: Establish Plan of Care for Chronic Disease Management Needs (DM,HTN and HLD) Completed 11/14/2021  Start Date: 04/11/2021  Expected End Date: 04/08/2022  Priority: High  Note:   RNCM Case closed goals met  Current Barriers:  Chronic Disease Management support and education needs related to HTN, HLD, and DMII  States she has not been having any chest pain and has not needed to use her NTG.  States she still has some swelling in her legs and she has been trying to keep them elevated. States her husband is currently getting treatments for his cancer.   States her CBGs have been ranging 103-153. States she has been eating less since her husband is eating less. States she is eating more vegetables and salads. States her B/P has been in the 140-150/69-80 range.   States her family and friends continue to  help her cope with her stress.  RNCM Clinical Goal(s):  Patient will verbalize understanding of plan for management of HTN, HLD, and DMII as evidenced by voiced adherence to plan of care verbalize basic understanding of  HTN, HLD, and DMII disease process and self health management plan as evidenced by voiced understanding and teach back take all medications exactly as prescribed and will call provider for medication related questions as evidenced by dispense report and pt verbalization attend all scheduled medical appointments: [name] NP  05/11/22, CCM LCSW 12/14/21, cardiology 03/27/22 as evidenced by medical record demonstrate Improved adherence to prescribed treatment plan for HTN, HLD, and DMII as evidenced by readings within limits, voiced adherence to plan of care continue to work with RN Care Manager to address care management and care coordination needs related to  HTN, HLD, and DMII as evidenced by adherence to CM Team Scheduled appointments through collaboration with RN Care manager, provider, and care team.   Interventions: 1:1 collaboration with primary care provider regarding development and update of comprehensive plan of care as evidenced by provider attestation and co-signature Inter-disciplinary care team collaboration (see longitudinal plan of care) Evaluation of current treatment plan related to  self management and patient's adherence to plan as established by provider   CAD Interventions: (Status:  Goal Met.) Long Term Goal Assessed understanding of CAD diagnosis Medications reviewed including medications utilized in CAD treatment plan Provided education on importance of blood pressure control in management of CAD Provided education on Importance of limiting foods high in cholesterol Reviewed Importance of taking all  medications as prescribed Reviewed Importance of attending all scheduled provider appointments Reinforced use of NTG and when to call 911.  Reviewed to keep her legs elevated and to continue to follow a low sodium diet Pain Interventions:  (Status:  Goal Met.) Long Term Goal Pain assessment performed Medications reviewed Reviewed provider established plan for pain management Discussed importance of adherence to all scheduled medical appointments Counseled on the importance of reporting any/all new or changed pain symptoms or management strategies to pain management provider Reviewed with patient prescribed pharmacological and nonpharmacological pain relief strategies Reinforced how stress can  trigger pain and reviewed ways to help with stress   Diabetes Interventions:  (Status:  Goal Met.) Long Term Goal Assessed patient's understanding of A1c goal: <7% Provided education to patient about basic DM disease process Reviewed medications with patient and discussed importance of medication adherence Counseled on importance of regular laboratory monitoring as prescribed Discussed plans with patient for ongoing care management follow up and provided patient with direct contact information for care management team Advised patient, providing education and rationale, to check cbg daily and record, calling provider for findings outside established parameters  Reinforced to try to eat a healthy well balanced diet when she is stressed and to get adequate sleep Lab Results  Component Value Date   HGBA1C 7.0 (A) 11/07/2021   Hyperlipidemia Interventions:  (Status:  Goal Met.) Long Term Goal Medication review performed; medication list updated in electronic medical record.  Provider established cholesterol goals reviewed Counseled on importance of regular laboratory monitoring as prescribed Reviewed role and benefits of statin for ASCVD risk reduction Reviewed importance of limiting foods high in cholesterol Reviewed exercise goals and target of 150 minutes per week  Hypertension Interventions:  (Status:  Goal Met.) Long Term Goal Last practice recorded BP readings:  BP Readings from Last 3 Encounters:  09/21/21 126/74  09/05/21 139/68  09/01/21 120/70  Most recent eGFR/CrCl: No results found for: EGFR  No components found for: CRCL  Evaluation of current treatment plan related to hypertension self management and patient's adherence to plan as established by provider Provided education to patient re: stroke prevention, s/s of heart attack and stroke Reviewed medications with patient and discussed importance of compliance Discussed plans with patient for ongoing care management follow up  and provided patient with direct contact information for care management team Provided education on prescribed diet low sodium low CHO Reinforced  importance of controlling stress to help with B/P.  Reinforced to try taking a short walk when possible to help with B/P and stress    Patient Goals/Self-Care Activities: Take all medications as prescribed Attend all scheduled provider appointments Call pharmacy for medication refills 3-7 days in advance of running out of medications Attend church or other social activities Perform all self care activities independently  Call provider office for new concerns or questions  keep appointment with eye doctor check blood sugar at prescribed times: once daily and when you have symptoms of low or high blood sugar check feet daily for cuts, sores or redness take the blood sugar log to all doctor visits drink 6 to 8 glasses of water each day fill half of plate with vegetables manage portion size switch to sugar-free drinks keep feet up while sitting check blood pressure 3 times per week choose a place to take my blood pressure (home, clinic or office, retail store) take blood pressure log to all doctor appointments eat more whole grains, fruits and vegetables, lean meats and healthy  fats limit salt intake to 2380m/day call for medicine refill 2 or 3 days before it runs out take all medications exactly as prescribed call doctor with any symptoms you believe are related to your medicine  Follow Up Plan:  The patient has been provided with contact information for the care management team and has been advised to call with any health related questions or concerns.  No further follow up required: RNCM Case closed goals met        Plan:The patient has been provided with contact information for the care management team and has been advised to call with any health related questions or concerns.  No further follow up required: RNCM Case closed goals met   MPeter GarterRN, BAvera St [facility] CDE Care Management Coordinator [place]([phone]

## 2021-11-16 ENCOUNTER — Encounter: Payer: Self-pay | Admitting: Licensed Clinical Social Worker

## 2021-11-16 ENCOUNTER — Ambulatory Visit: Payer: Medicare Other | Admitting: Licensed Clinical Social Worker

## 2021-11-16 NOTE — Patient Instructions (Signed)
Visit Information  Thank you for taking time to visit with me today. Please don't hesitate to contact me if I can be of assistance to you.   Following are the goals we discussed today:   Goals Addressed             This Visit's Progress    COMPLETED: Manage Depression/Anxiety Symptoms   On track    Care Coordination Interventions: Mindfulness or Relaxation training provided Active listening / Reflection utilized  Emotional Support Provided Quality of sleep assessed & Sleep Hygiene techniques promoted  Caregiver stress acknowledged  Participation in counseling encouraged-Pt not interested noting she has a strong support system from family and church Verbalization of feelings encouraged   Patient continues to track swelling in legs/feet. Strategies include elevate legs and has compression socks/pads, available as needed Pt identified healthy coping skills (volunteers at church/writing) to assist with management of mental health symptoms       Please call the care guide team at [phone] if you need to cancel or reschedule your appointment.   If you are experiencing a Mental Health or [place] or need someone to talk to, please call the Suicide and Crisis Lifeline: 988 call 911   Patient verbalizes understanding of instructions and care plan provided today and agrees to view in [place]. Active MyChart status and patient understanding of how to access instructions and care plan via MyChart confirmed with patient.     No further follow up required: Pt not in need of continued follow up  Christa See, MSW, [email or web address] Phone [phone] 5:05 PM

## 2021-11-16 NOTE — Patient Outreach (Signed)
  Care Coordination   Initial Visit Note   11/16/2021 Name: [name] MRN: [id number] DOB: Jun 06, 1950  Katanya Schlie is a 71 y.o. year old female who sees Nafziger, Tommi Rumps, NP for primary care. I spoke with  Rudy Jew D'Annunzio by phone today  What matters to the patients health and wellness today?  Management of mental health conditions    Goals Addressed             This Visit's Progress    COMPLETED: Manage Depression/Anxiety Symptoms   On track    Care Coordination Interventions: Mindfulness or Relaxation training provided Active listening / Reflection utilized  Emotional Support Provided Quality of sleep assessed & Sleep Hygiene techniques promoted  Caregiver stress acknowledged  Participation in counseling encouraged-Pt not interested noting she has a strong support system from family and church Verbalization of feelings encouraged   Patient continues to track swelling in legs/feet. Strategies include elevate legs and has compression socks/pads, available as needed Pt identified healthy coping skills (volunteers at church/writing) to assist with management of mental health symptoms        SDOH assessments and interventions completed:  No     Care Coordination Interventions Activated:  Yes  Care Coordination Interventions:  Yes, provided   Follow up plan: No further intervention required.   Encounter Outcome:  Pt. Visit Completed   Christa See, MSW, [email or web address] Phone [phone] 5:04 PM

## 2021-11-20 ENCOUNTER — Other Ambulatory Visit: Payer: Self-pay | Admitting: Adult Health

## 2021-11-20 DIAGNOSIS — F419 Anxiety disorder, unspecified: Secondary | ICD-10-CM

## 2021-11-28 ENCOUNTER — Encounter: Payer: Self-pay | Admitting: Adult Health

## 2021-11-28 NOTE — Telephone Encounter (Signed)
Please advise 

## 2021-12-14 ENCOUNTER — Telehealth: Payer: Medicare Other

## 2021-12-23 ENCOUNTER — Other Ambulatory Visit: Payer: Self-pay | Admitting: Adult Health

## 2021-12-23 DIAGNOSIS — G2581 Restless legs syndrome: Secondary | ICD-10-CM

## 2021-12-30 ENCOUNTER — Other Ambulatory Visit: Payer: Self-pay | Admitting: Adult Health

## 2021-12-30 DIAGNOSIS — Z760 Encounter for issue of repeat prescription: Secondary | ICD-10-CM

## 2021-12-30 DIAGNOSIS — E118 Type 2 diabetes mellitus with unspecified complications: Secondary | ICD-10-CM

## 2022-01-04 ENCOUNTER — Ambulatory Visit (INDEPENDENT_AMBULATORY_CARE_PROVIDER_SITE_OTHER): Payer: Medicare Other

## 2022-01-04 VITALS — BP 120/60 | HR 69 | Temp 98.2°F | Ht 63.0 in | Wt 183.3 lb

## 2022-01-04 DIAGNOSIS — Z0000 Encounter for general adult medical examination without abnormal findings: Secondary | ICD-10-CM

## 2022-01-04 NOTE — Patient Instructions (Addendum)
[name] , Thank you for taking time to come for your Medicare Wellness Visit. I appreciate your ongoing commitment to your health goals. Please review the following plan we discussed and let me know if I can assist you in the future.   These are the goals we discussed:  Goals       Find Help in My Community      Patient Goals/Self-Care Activities: Over the next 120 days Attend scheduled medical appointments Continue to utilize healthy coping skills and/or supportive resources provided Contact PCP office with any questions or concerns      Stay Healthy (pt-stated)      Weight (lb) < 170 lb (77.1 kg)        This is a list of the screening recommended for you and due dates:  Health Maintenance  Topic Date Due   COVID-19 Vaccine (6 - Pfizer risk series) 01/20/2022*   Zoster (Shingles) Vaccine (1 of 2) 04/05/2022*   Flu Shot  07/15/2022*   Tetanus Vaccine  03/20/2022   Mammogram  03/30/2022   Eye exam for diabetics  04/26/2022   Yearly kidney function blood test for diabetes  05/09/2022   Yearly kidney health urinalysis for diabetes  05/09/2022   Complete foot exam   05/09/2022   Hemoglobin A1C  05/10/2022   Colon Cancer Screening  10/09/2022   Pneumonia Vaccine  Completed   DEXA scan (bone density measurement)  Completed   Hepatitis C Screening: USPSTF Recommendation to screen - Ages 64-79 yo.  Completed   HPV Vaccine  Aged Out  *Topic was postponed. The date shown is not the original due date.    Advanced directives: Copy on file  Conditions/risks identified: None  Next appointment: Follow up in one year for your annual wellness visit     Preventive Care 65 Years and Older, Female Preventive care refers to lifestyle choices and visits with your health care provider that can promote health and wellness. What does preventive care include? A yearly physical exam. This is also called an annual well check. Dental exams once or twice a year. Routine eye exams. Ask  your health care provider how often you should have your eyes checked. Personal lifestyle choices, including: Daily care of your teeth and gums. Regular physical activity. Eating a healthy diet. Avoiding tobacco and drug use. Limiting alcohol use. Practicing safe sex. Taking low-dose aspirin every day. Taking vitamin and mineral supplements as recommended by your health care provider. What happens during an annual well check? The services and screenings done by your health care provider during your annual well check will depend on your age, overall health, lifestyle risk factors, and family history of disease. Counseling  Your health care provider may ask you questions about your: Alcohol use. Tobacco use. Drug use. Emotional well-being. Home and relationship well-being. Sexual activity. Eating habits. History of falls. Memory and ability to understand (cognition). Work and work Statistician. Reproductive health. Screening  You may have the following tests or measurements: Height, weight, and BMI. Blood pressure. Lipid and cholesterol levels. These may be checked every 5 years, or more frequently if you are over 51 years old. Skin check. Lung cancer screening. You may have this screening every year starting at age 92 if you have a 30-pack-year history of smoking and currently smoke or have quit within the past 15 years. Fecal occult blood test (FOBT) of the stool. You may have this test every year starting at age 80. Flexible sigmoidoscopy or colonoscopy. You  may have a sigmoidoscopy every 5 years or a colonoscopy every 10 years starting at age 70. Hepatitis C blood test. Hepatitis B blood test. Sexually transmitted disease (STD) testing. Diabetes screening. This is done by checking your blood sugar (glucose) after you have not eaten for a while (fasting). You may have this done every 1-3 years. Bone density scan. This is done to screen for osteoporosis. You may have this done  starting at age 68. Mammogram. This may be done every 1-2 years. Talk to your health care provider about how often you should have regular mammograms. Talk with your health care provider about your test results, treatment options, and if necessary, the need for more tests. Vaccines  Your health care provider may recommend certain vaccines, such as: Influenza vaccine. This is recommended every year. Tetanus, diphtheria, and acellular pertussis (Tdap, Td) vaccine. You may need a Td booster every 10 years. Zoster vaccine. You may need this after age 101. Pneumococcal 13-valent conjugate (PCV13) vaccine. One dose is recommended after age 56. Pneumococcal polysaccharide (PPSV23) vaccine. One dose is recommended after age 78. Talk to your health care provider about which screenings and vaccines you need and how often you need them. This information is not intended to replace advice given to you by your health care provider. Make sure you discuss any questions you have with your health care provider. Document Released: 04/29/2015 Document Revised: 12/21/2015 Document Reviewed: 02/01/2015 Elsevier Interactive Patient Education  2017 [place] Prevention in the Home Falls can cause injuries. They can happen to people of all ages. There are many things you can do to make your home safe and to help prevent falls. What can I do on the outside of my home? Regularly fix the edges of walkways and driveways and fix any cracks. Remove anything that might make you trip as you walk through a door, such as a raised step or threshold. Trim any bushes or trees on the path to your home. Use bright outdoor lighting. Clear any walking paths of anything that might make someone trip, such as rocks or tools. Regularly check to see if handrails are loose or broken. Make sure that both sides of any steps have handrails. Any raised decks and porches should have guardrails on the edges. Have any leaves, snow, or  ice cleared regularly. Use sand or salt on walking paths during winter. Clean up any spills in your garage right away. This includes oil or grease spills. What can I do in the bathroom? Use night lights. Install grab bars by the toilet and in the tub and shower. Do not use towel bars as grab bars. Use non-skid mats or decals in the tub or shower. If you need to sit down in the shower, use a plastic, non-slip stool. Keep the floor dry. Clean up any water that spills on the floor as soon as it happens. Remove soap buildup in the tub or shower regularly. Attach bath mats securely with double-sided non-slip rug tape. Do not have throw rugs and other things on the floor that can make you trip. What can I do in the bedroom? Use night lights. Make sure that you have a light by your bed that is easy to reach. Do not use any sheets or blankets that are too big for your bed. They should not hang down onto the floor. Have a firm chair that has side arms. You can use this for support while you get dressed. Do not have throw  rugs and other things on the floor that can make you trip. What can I do in the kitchen? Clean up any spills right away. Avoid walking on wet floors. Keep items that you use a lot in easy-to-reach places. If you need to reach something above you, use a strong step stool that has a grab bar. Keep electrical cords out of the way. Do not use floor polish or wax that makes floors slippery. If you must use wax, use non-skid floor wax. Do not have throw rugs and other things on the floor that can make you trip. What can I do with my stairs? Do not leave any items on the stairs. Make sure that there are handrails on both sides of the stairs and use them. Fix handrails that are broken or loose. Make sure that handrails are as long as the stairways. Check any carpeting to make sure that it is firmly attached to the stairs. Fix any carpet that is loose or worn. Avoid having throw rugs at  the top or bottom of the stairs. If you do have throw rugs, attach them to the floor with carpet tape. Make sure that you have a light switch at the top of the stairs and the bottom of the stairs. If you do not have them, ask someone to add them for you. What else can I do to help prevent falls? Wear shoes that: Do not have high heels. Have rubber bottoms. Are comfortable and fit you well. Are closed at the toe. Do not wear sandals. If you use a stepladder: Make sure that it is fully opened. Do not climb a closed stepladder. Make sure that both sides of the stepladder are locked into place. Ask someone to hold it for you, if possible. Clearly mark and make sure that you can see: Any grab bars or handrails. First and last steps. Where the edge of each step is. Use tools that help you move around (mobility aids) if they are needed. These include: Canes. Walkers. Scooters. Crutches. Turn on the lights when you go into a dark area. Replace any light bulbs as soon as they burn out. Set up your furniture so you have a clear path. Avoid moving your furniture around. If any of your floors are uneven, fix them. If there are any pets around you, be aware of where they are. Review your medicines with your doctor. Some medicines can make you feel dizzy. This can increase your chance of falling. Ask your doctor what other things that you can do to help prevent falls. This information is not intended to replace advice given to you by your health care provider. Make sure you discuss any questions you have with your health care provider. Document Released: 01/27/2009 Document Revised: 09/08/2015 Document Reviewed: 05/07/2014 Elsevier Interactive Patient Education  2017 [employer].

## 2022-01-04 NOTE — Progress Notes (Signed)
Subjective:   [name] is a 71 y.o. female who presents for Medicare Annual (Subsequent) preventive examination.  Review of Systems     Cardiac Risk Factors include: advanced age (>7mn, >>57women);hypertension;diabetes mellitus     Objective:    Today's Vitals   01/04/22 1532  BP: 120/60  Pulse: 69  Temp: 98.2 F (36.8 C)  TempSrc: Oral  SpO2: 98%  Weight: 183 lb 4.8 oz (83.1 kg)  Height: '5\' 3"'  (1.6 m)   Body mass index is 32.47 kg/m.     01/04/2022    3:53 PM 09/05/2021    6:18 AM 01/02/2021    8:39 AM 10/31/2020   11:44 AM 01/01/2020   10:07 AM 10/16/2016    7:52 AM 05/17/2015   10:11 AM  Advanced Directives  Does Patient Have a Medical Advance Directive? Yes Yes Yes Yes No No No  Type of AParamedicof [place]Living will [place]Living will [place]Living will [place]Living will     Does patient want to make changes to medical advance directive? No - Patient declined No - Patient declined  No - Patient declined     Copy of HRodeoin Chart? Yes - validated most recent copy scanned in chart (See row information) No - copy requested No - copy requested Yes - validated most recent copy scanned in chart (See row information)     Would patient like information on creating a medical advance directive?     No - Patient declined  No - patient declined information    Current Medications (verified) Outpatient Encounter Medications as of 01/04/2022  Medication Sig   acetaminophen (TYLENOL) 500 MG tablet Take 1,000 mg by mouth every 8 (eight) hours as needed for moderate pain.   ALPRAZolam (XANAX) 0.25 MG tablet Take 1 tablet (0.25 mg total) by mouth 2 (two) times daily as needed for anxiety.   amLODipine (NORVASC) 5 MG tablet Take 1 tablet (5 mg total) by mouth daily.   aspirin 81 MG tablet Take 81 mg by mouth at bedtime.   atorvastatin (LIPITOR) 10 MG  tablet TAKE 1 TABLET(10 MG) BY MOUTH DAILY   blood glucose meter kit and supplies KIT Dispense based on patient and insurance preference. Use up to four times daily as directed.   Carboxymethylcellul-Glycerin (LUBRICATING EYE DROPS OP) Place 1 drop into both eyes daily as needed (dry eyes).   cholecalciferol (VITAMIN D3) 25 MCG (1000 UT) tablet Take 1,000 Units by mouth daily.   citalopram (CELEXA) 20 MG tablet TAKE 1 TABLET(20 MG) BY MOUTH DAILY   Emollient (GOLD BOND DIABETICS DRY SKIN) CREA Apply 1 application. topically at bedtime.   glucose blood test strip Use to check blood glucose TID   Lancets 28G MISC Use to check blood glucose 4 times daily   levothyroxine (SYNTHROID) 88 MCG tablet TAKE 1 TABLET(88 MCG) BY MOUTH DAILY   Lidocaine 4 % AERO Apply 1 spray topically at bedtime as needed (foot pain).   losartan-hydrochlorothiazide (HYZAAR) 100-25 MG tablet TAKE 1 TABLET BY MOUTH DAILY   metFORMIN (GLUCOPHAGE) 500 MG tablet TAKE 1 TABLET(500 MG) BY MOUTH TWICE DAILY WITH A MEAL   nitroGLYCERIN (NITROSTAT) 0.4 MG SL tablet Place 1 tablet (0.4 mg total) under the tongue every 5 (five) minutes as needed for chest pain.   pramipexole (MIRAPEX) 1.5 MG tablet TAKE 1 TABLET(1.5 MG) BY MOUTH AT BEDTIME   vitamin B-12 (CYANOCOBALAMIN) 1000  MCG tablet Take 1,000 mcg by mouth daily.   No facility-administered encounter medications on file as of 01/04/2022.    Allergies (verified) Benadryl [diphenhydramine hcl], Ace inhibitors, Amoxicillin, Aspirin, Codeine, Fish allergy, Hydrocodone, Indomethacin, Pentazocine lactate, Promethazine hcl, Quinine, and Chlorphen-phenyleph-methscop   History: Past Medical History:  Diagnosis Date   Allergy    Anemia    Anxiety    ANXIETY DEPRESSION 09/15/2007   Arthritis    ASTHMA 05/15/2009   Asthma    DIABETES MELLITUS, TYPE II 12/11/2006   DIVERTICULOSIS, COLON 12/11/2006   Edema 01/15/2009   Gout    pt denies; toe was broken   Headache(784.0) 12/11/2006    Hyperlipidemia    HYPERTENSION NEC 08/14/2007   HYPOTHYROIDISM 12/11/2006   PELVIC PAIN, CHRONIC 08/14/2007   Restless leg syndrome    Past Surgical History:  Procedure Laterality Date   ABDOMINAL HYSTERECTOMY     APPENDECTOMY     Bladder Suspension     LEFT HEART CATH AND CORONARY ANGIOGRAPHY N/A 09/05/2021   Procedure: LEFT HEART CATH AND CORONARY ANGIOGRAPHY;  Surgeon: Troy Sine, MD;  Location: [place] [facility];  Service: Cardiovascular;  Laterality: N/A;   Family History  Problem Relation Age of Onset   COPD Mother        smoker   Diabetes Mother    COPD Father        smoker   Diabetes Father    Diabetes Sister    Diabetes Mellitus II Sister    Diabetes Sister    Diabetes Sister    Diabetes Sister    Breast cancer Other 56   Lung cancer Nephew 60   Colon cancer Neg Hx    Esophageal cancer Neg Hx    Pancreatic cancer Neg Hx    Social History   Socioeconomic History   Marital status: Married    Spouse name: Not on file   Number of children: Not on file   Years of education: Not on file   Highest education level: 12th grade  Occupational History   Not on file  Tobacco Use   Smoking status: Never   Smokeless tobacco: Never  Substance and Sexual Activity   Alcohol use: Yes    Alcohol/week: 0.0 standard drinks of alcohol    Comment: very rare   Drug use: No   Sexual activity: Not on file  Other Topics Concern   Not on file  Social History Narrative   Not on file   Social Determinants of Health   Financial Resource Strain: Low Risk  (01/04/2022)   Overall Financial Resource Strain (CARDIA)    Difficulty of Paying Living Expenses: Not hard at all  Food Insecurity: No Food Insecurity (01/04/2022)   Hunger Vital Sign    Worried About Running Out of Food in the Last Year: Never true    Ran Out of Food in the Last Year: Never true  Transportation Needs: No Transportation Needs (01/04/2022)   PRAPARE - Hydrologist (Medical): No     Lack of Transportation (Non-Medical): No  Physical Activity: Inactive (01/04/2022)   Exercise Vital Sign    Days of Exercise per Week: 0 days    Minutes of Exercise per Session: 0 min  Stress: No Stress Concern Present (01/04/2022)   [place]    Feeling of Stress : Not at all  Social Connections: [place] (01/04/2022)   Social Connection and Isolation Panel [NHANES]  Frequency of Communication with Friends and Family: More than three times a week    Frequency of Social Gatherings with Friends and Family: More than three times a week    Attends Religious Services: More than 4 times per year    Active Member of [employer] or Organizations: Yes    Attends Music therapist: More than 4 times per year    Marital Status: Married    Tobacco Counseling Counseling given: Not Answered   Clinical Intake:  Pre-visit preparation completed: NoNutrition Risk Assessment:  Has the patient had any N/V/D within the last 2 months?  No  Does the patient have any non-healing wounds?  No  Has the patient had any unintentional weight loss or weight gain?  No   Diabetes:  Is the patient diabetic?  No  If diabetic, was a CBG obtained today?  No  Did the patient bring in their glucometer from home?  No  How often do you monitor your CBG's? 3 X Weekly.   Financial Strains and Diabetes Management:  Are you having any financial strains with the device, your supplies or your medication? No .  Does the patient want to be seen by Chronic Care Management for management of their diabetes?  No  Would the patient like to be referred to a Nutritionist or for Diabetic Management?  No   Diabetic Exams:  Diabetic Eye Exam: Completed No. Overdue for diabetic eye exam. Pt has been advised about the importance in completing this exam. A referral has been placed today. Message sent to referral coordinator for scheduling  purposes. Advised pt to expect a call from office referred to regarding appt.  Diabetic Foot Exam: Completed No. Pt has been advised about the importance in completing this exam. Pt is scheduled for diabetic foot exam on Followed by PCP.   How often do you need to have someone help you when you read instructions, pamphlets, or other written materials from your doctor or pharmacy?: 1 - Never  Diabetic?  Yes  Interpreter Needed?: No Activities of Daily Living    01/04/2022    3:51 PM  In your present state of health, do you have any difficulty performing the following activities:  Hearing? 0  Vision? 0  Difficulty concentrating or making decisions? 0  Walking or climbing stairs? 0  Dressing or bathing? 0  Doing errands, shopping? 0  Preparing Food and eating ? N  Using the Toilet? N  In the past six months, have you accidently leaked urine? N  Do you have problems with loss of bowel control? N  Managing your Medications? N  Managing your Finances? N  Housekeeping or managing your Housekeeping? N    Patient Care Team: [name], NP as PCP - General (Family Medicine) Elouise Munroe, MD as PCP - Cardiology (Cardiology)  Indicate any recent Medical Services you may have received from other than [facility] providers in the past year (date may be approximate).     Assessment:   This is a routine wellness examination for Ellicia.  Hearing/Vision screen Hearing Screening - Comments:: Wears hearing aids Vision Screening - Comments:: Wears rx glasses - up to date with routine eye exams with  Dr Katy Fitch  Dietary issues and exercise activities discussed: Current Exercise Habits: The patient does not participate in regular exercise at present, Exercise limited by: None identified   Goals Addressed               This Visit's Progress  Stay Healthy (pt-stated)         Depression Screen    01/04/2022    3:46 PM 08/08/2021    8:03 AM 05/09/2021    1:02 PM 01/02/2021    8:35  AM 10/31/2020   11:39 AM 01/01/2020   10:11 AM 11/03/2019    8:06 AM  PHQ 2/9 Scores  PHQ - 2 Score 2 0 2 0 1 2 0  PHQ- 9 Score '2 2 3   2     ' Fall Risk    01/04/2022    3:52 PM 05/09/2021   12:57 PM 03/28/2021    4:10 PM 01/02/2021    8:46 AM 10/31/2020   11:41 AM  Fall Risk   Falls in the past year? 1 0 '1 1 1  ' Comment    trip and fall on grass   Number falls in past yr: 0  0 0 1  Comment     moved too fast  Injury with Fall? 0  0 1 0  Risk for fall due to : No Fall Risks   No Fall Risks   Follow up Falls prevention discussed   Falls evaluation completed Education provided;Falls prevention discussed    FALL RISK PREVENTION PERTAINING TO THE HOME:  Any stairs in or around the home? Yes  If so, are there any without handrails? No  Home free of loose throw rugs in walkways, pet beds, electrical cords, etc? Yes  Adequate lighting in your home to reduce risk of falls? Yes   ASSISTIVE DEVICES UTILIZED TO PREVENT FALLS:  Life alert? No  Use of a cane, walker or w/c? Yes  Grab bars in the bathroom? Yes  Shower chair or bench in shower? Yes  Elevated toilet seat or a handicapped toilet? Yes   TIMED UP AND GO:  Was the test performed? Yes .  Length of time to ambulate 10 feet: 10 sec.   Gait steady and fast without use of assistive device  Cognitive Function:        01/04/2022    3:53 PM 01/01/2020   10:15 AM  6CIT Screen  What Year? 0 points 0 points  What month? 0 points 0 points  What time? 0 points 0 points  Count back from 20 0 points 0 points  Months in reverse 0 points 0 points  Repeat phrase 0 points 0 points  Total Score 0 points 0 points    Immunizations Immunization History  Administered Date(s) Administered   Fluad Quad(high Dose 65+) 01/05/2021   Influenza Whole 01/18/2009   Influenza, High Dose Seasonal PF 04/22/2018   Influenza,inj,Quad PF,6+ Mos 05/20/2014, 01/23/2017   Influenza-Unspecified 01/15/2015   PFIZER(Purple Top)SARS-COV-2 Vaccination  06/27/2019, 07/21/2019, 03/23/2020, 07/25/2020   Pfizer Covid-19 Vaccine Bivalent Booster 5y-11y 02/14/2021   Pneumococcal Conjugate-13 04/22/2018   Pneumococcal Polysaccharide-23 05/20/2014, 11/03/2019   Td 04/16/2005, 03/20/2012   Zoster, Live 05/28/2014    TDAP status: Up to date  Flu Vaccine status: Due, Education has been provided regarding the importance of this vaccine. Advised may receive this vaccine at local pharmacy or Health Dept. Aware to provide a copy of the vaccination record if obtained from local pharmacy or Health Dept. Verbalized acceptance and understanding.  Pneumococcal vaccine status: Up to date  Covid-19 vaccine status: Completed vaccines  Qualifies for Shingles Vaccine? Yes   Zostavax completed No   Shingrix Completed?: No.    Education has been provided regarding the importance of this vaccine. Patient has been advised to call  insurance company to determine out of pocket expense if they have not yet received this vaccine. Advised may also receive vaccine at local pharmacy or Health Dept. Verbalized acceptance and understanding.  Screening Tests Health Maintenance  Topic Date Due   COVID-19 Vaccine (6 - Pfizer risk series) 01/20/2022 (Originally 04/11/2021)   Zoster Vaccines- Shingrix (1 of 2) 04/05/2022 (Originally 06/21/1969)   INFLUENZA VACCINE  07/15/2022 (Originally 11/14/2021)   TETANUS/TDAP  03/20/2022   MAMMOGRAM  03/30/2022   OPHTHALMOLOGY EXAM  04/26/2022   Diabetic kidney evaluation - GFR measurement  05/09/2022   Diabetic kidney evaluation - Urine ACR  05/09/2022   FOOT EXAM  05/09/2022   HEMOGLOBIN A1C  05/10/2022   COLONOSCOPY (Pts 45-22yr Insurance coverage will need to be confirmed)  10/09/2022   Pneumonia Vaccine 71 Years old  Completed   DEXA SCAN  Completed   Hepatitis C Screening  Completed   HPV VACCINES  Aged Out    Health Maintenance  There are no preventive care reminders to display for this patient.   Colorectal cancer  screening: Type of screening: Colonoscopy. Completed 10/09/17. Repeat every 5 years  Mammogram status: Completed 03/30/21. Repeat every year  Bone Density status: Completed 10/15/18. Results reflect: Bone density results: OSTEOPOROSIS. Repeat every   years.  Lung Cancer Screening: (Low Dose CT Chest recommended if Age 69-80years, 30 pack-year currently smoking OR have quit w/in 15years.) does not qualify.     Additional Screening:  Hepatitis C Screening: does qualify; Completed 06/13/17  Vision Screening: Recommended annual ophthalmology exams for early detection of glaucoma and other disorders of the eye. Is the patient up to date with their annual eye exam?  Yes  Who is the provider or what is the name of the office in which the patient attends annual eye exams? Dr GKaty FitchIf pt is not established with a provider, would they like to be referred to a provider to establish care? No .   Dental Screening: Recommended annual dental exams for proper oral hygiene  Community Resource Referral / Chronic Care Management:  CRR required this visit?  No   CCM required this visit?  No      Plan:     I have personally reviewed and noted the following in the patient's chart:   Medical and social history Use of alcohol, tobacco or illicit drugs  Current medications and supplements including opioid prescriptions. Patient is not currently taking opioid prescriptions. Functional ability and status Nutritional status Physical activity Advanced directives List of other physicians Hospitalizations, surgeries, and ER visits in previous 12 months Vitals Screenings to include cognitive, depression, and falls Referrals and appointments  In addition, I have reviewed and discussed with patient certain preventive protocols, quality metrics, and best practice recommendations. A written personalized care plan for preventive services as well as general preventive health recommendations were provided to  patient.     BCriselda Peaches LPN   [id number]  Nurse Notes: None

## 2022-01-08 ENCOUNTER — Other Ambulatory Visit: Payer: Self-pay | Admitting: Adult Health

## 2022-01-11 ENCOUNTER — Ambulatory Visit (INDEPENDENT_AMBULATORY_CARE_PROVIDER_SITE_OTHER): Payer: Medicare Other | Admitting: Adult Health

## 2022-01-11 ENCOUNTER — Encounter: Payer: Self-pay | Admitting: Adult Health

## 2022-01-11 VITALS — BP 138/80 | HR 70 | Temp 98.2°F | Ht 63.0 in

## 2022-01-11 DIAGNOSIS — G8929 Other chronic pain: Secondary | ICD-10-CM | POA: Diagnosis not present

## 2022-01-11 DIAGNOSIS — I200 Unstable angina: Secondary | ICD-10-CM | POA: Diagnosis not present

## 2022-01-11 DIAGNOSIS — M25511 Pain in right shoulder: Secondary | ICD-10-CM | POA: Diagnosis not present

## 2022-01-11 MED ORDER — METHYLPREDNISOLONE ACETATE 80 MG/ML IJ SUSP
80.0000 mg | Freq: Once | INTRAMUSCULAR | Status: AC
  Administered 2022-01-11: 80 mg via INTRA_ARTICULAR

## 2022-01-11 NOTE — Progress Notes (Signed)
Subjective:    Patient ID: [name], female    DOB: Mar 20, 1951, 71 y.o.   MRN: [id number]  HPI 71 year old female who  has a past medical history of Allergy, Anemia, Anxiety, ANXIETY DEPRESSION (09/15/2007), Arthritis, ASTHMA (05/15/2009), Asthma, DIABETES MELLITUS, TYPE II (12/11/2006), DIVERTICULOSIS, COLON (12/11/2006), Edema (01/15/2009), Gout, Headache(784.0) (12/11/2006), Hyperlipidemia, HYPERTENSION NEC (08/14/2007), HYPOTHYROIDISM (12/11/2006), PELVIC PAIN, CHRONIC (08/14/2007), and Restless leg syndrome.  Presents to the office today with a complaint of chronic right shoulder pain.  When she was last seen in May 2023 she has been having some acute left shoulder pain for a few weeks prior to the point where it became hard for her to raise her arm overhead, had decreased grip strength, and loss of range of motion of the right shoulder.  She was taking NSAIDs without relief.  She opted for steroid injection to see if this would help with her discomfort  Today she reports that the last steroid injection worked very well and she was able to get ROM back. Over the last few weeks the pain and loss of ROM has come back. She is caring for her husband with bladder cancer and does not have time to see by orthopedics right now.    Review of Systems See HPI   Past Medical History:  Diagnosis Date   Allergy    Anemia    Anxiety    ANXIETY DEPRESSION 09/15/2007   Arthritis    ASTHMA 05/15/2009   Asthma    DIABETES MELLITUS, TYPE II 12/11/2006   DIVERTICULOSIS, COLON 12/11/2006   Edema 01/15/2009   Gout    pt denies; toe was broken   Headache(784.0) 12/11/2006   Hyperlipidemia    HYPERTENSION NEC 08/14/2007   HYPOTHYROIDISM 12/11/2006   PELVIC PAIN, CHRONIC 08/14/2007   Restless leg syndrome     Social History   Socioeconomic History   Marital status: Married    Spouse name: Not on file   Number of children: Not on file   Years of education: Not on file   Highest education level:  12th grade  Occupational History   Not on file  Tobacco Use   Smoking status: Never   Smokeless tobacco: Never  Substance and Sexual Activity   Alcohol use: Yes    Alcohol/week: 0.0 standard drinks of alcohol    Comment: very rare   Drug use: No   Sexual activity: Not on file  Other Topics Concern   Not on file  Social History Narrative   Not on file   Social Determinants of Health   Financial Resource Strain: Low Risk  (01/04/2022)   Overall Financial Resource Strain (CARDIA)    Difficulty of Paying Living Expenses: Not hard at all  Food Insecurity: No Food Insecurity (01/04/2022)   Hunger Vital Sign    Worried About Running Out of Food in the Last Year: Never true    Ran Out of Food in the Last Year: Never true  Transportation Needs: No Transportation Needs (01/04/2022)   PRAPARE - Hydrologist (Medical): No    Lack of Transportation (Non-Medical): No  Physical Activity: Inactive (01/04/2022)   Exercise Vital Sign    Days of Exercise per Week: 0 days    Minutes of Exercise per Session: 0 min  Stress: No Stress Concern Present (01/04/2022)   [place]    Feeling of Stress : Not at all  Social Connections:  Socially Integrated (01/04/2022)   Social Connection and Isolation Panel [NHANES]    Frequency of Communication with Friends and Family: More than three times a week    Frequency of Social Gatherings with Friends and Family: More than three times a week    Attends Religious Services: More than 4 times per year    Active Member of Clubs or Organizations: Yes    Attends Archivist Meetings: More than 4 times per year    Marital Status: Married  Human resources officer Violence: Not At Risk (01/04/2022)   Humiliation, Afraid, Rape, and Kick questionnaire    Fear of Current or Ex-Partner: No    Emotionally Abused: No    Physically Abused: No    Sexually Abused: No    Past  Surgical History:  Procedure Laterality Date   ABDOMINAL HYSTERECTOMY     APPENDECTOMY     Bladder Suspension     LEFT HEART CATH AND CORONARY ANGIOGRAPHY N/A 09/05/2021   Procedure: LEFT HEART CATH AND CORONARY ANGIOGRAPHY;  Surgeon: Troy Sine, MD;  Location: [place] [facility];  Service: Cardiovascular;  Laterality: N/A;    Family History  Problem Relation Age of Onset   COPD Mother        smoker   Diabetes Mother    COPD Father        smoker   Diabetes Father    Diabetes Sister    Diabetes Mellitus II Sister    Diabetes Sister    Diabetes Sister    Diabetes Sister    Breast cancer Other 47   Lung cancer Nephew 60   Colon cancer Neg Hx    Esophageal cancer Neg Hx    Pancreatic cancer Neg Hx     Allergies  Allergen Reactions   Benadryl [Diphenhydramine Hcl] Anaphylaxis   Ace Inhibitors Cough   Amoxicillin     Unknown reaction   Aspirin Nausea And Vomiting    Tolerates low dose aspirin    Codeine Nausea And Vomiting   Fish Allergy Nausea And Vomiting   Hydrocodone Nausea And Vomiting   Indomethacin     Unknown reaction    Pentazocine Lactate     passing out   Promethazine Hcl     Unknown reaction    Quinine     itching redface   Chlorphen-Phenyleph-Methscop Rash    Current Outpatient Medications on File Prior to Visit  Medication Sig Dispense Refill   acetaminophen (TYLENOL) 500 MG tablet Take 1,000 mg by mouth every 8 (eight) hours as needed for moderate pain.     ALPRAZolam (XANAX) 0.25 MG tablet Take 1 tablet (0.25 mg total) by mouth 2 (two) times daily as needed for anxiety. 60 tablet 0   amLODipine (NORVASC) 5 MG tablet Take 1 tablet (5 mg total) by mouth daily. 30 tablet 11   aspirin 81 MG tablet Take 81 mg by mouth at bedtime.     atorvastatin (LIPITOR) 10 MG tablet TAKE 1 TABLET(10 MG) BY MOUTH DAILY 90 tablet 3   blood glucose meter kit and supplies KIT Dispense based on patient and insurance preference. Use up to four times daily as directed.  1 each 0   Carboxymethylcellul-Glycerin (LUBRICATING EYE DROPS OP) Place 1 drop into both eyes daily as needed (dry eyes).     cholecalciferol (VITAMIN D3) 25 MCG (1000 UT) tablet Take 1,000 Units by mouth daily.     citalopram (CELEXA) 20 MG tablet TAKE 1 TABLET(20 MG) BY MOUTH DAILY 90  tablet 1   Emollient (GOLD BOND DIABETICS DRY SKIN) CREA Apply 1 application. topically at bedtime.     glucose blood test strip Use to check blood glucose TID 200 each 0   Lancets 28G MISC Use to check blood glucose 4 times daily 100 each 0   levothyroxine (SYNTHROID) 88 MCG tablet TAKE 1 TABLET(88 MCG) BY MOUTH DAILY 90 tablet 3   Lidocaine 4 % AERO Apply 1 spray topically at bedtime as needed (foot pain).     losartan-hydrochlorothiazide (HYZAAR) 100-25 MG tablet TAKE 1 TABLET BY MOUTH DAILY 90 tablet 1   metFORMIN (GLUCOPHAGE) 500 MG tablet TAKE 1 TABLET(500 MG) BY MOUTH TWICE DAILY WITH A MEAL 180 tablet 0   pramipexole (MIRAPEX) 1.5 MG tablet TAKE 1 TABLET(1.5 MG) BY MOUTH AT BEDTIME 90 tablet 1   vitamin B-12 (CYANOCOBALAMIN) 1000 MCG tablet Take 1,000 mcg by mouth daily.     nitroGLYCERIN (NITROSTAT) 0.4 MG SL tablet Place 1 tablet (0.4 mg total) under the tongue every 5 (five) minutes as needed for chest pain. 25 tablet 3   No current facility-administered medications on file prior to visit.    BP 138/80   Pulse 70   Temp 98.2 F (36.8 C) (Oral)   Ht _0  (1.6 m)   SpO2 94%   BMI 32.47 kg/m       Objective:   Physical Exam Vitals and nursing note reviewed.  Constitutional:      Appearance: Normal appearance.  Musculoskeletal:     Right shoulder: Bony tenderness present. No swelling, deformity or crepitus. Decreased range of motion. Decreased strength. Normal pulse.  Skin:    General: Skin is warm and dry.  Neurological:     General: No focal deficit present.     Mental Status: She is alert and oriented to person, place, and time.  Psychiatric:        Mood and Affect: Mood normal.         Behavior: Behavior normal.        Thought Content: Thought content normal.        Judgment: Judgment normal.        Assessment & Plan:  1. Chronic right shoulder pain Shoulder injection Verbal consent obtained and verified. Sterile betadine prep. Furthur cleansed with alcohol. Topical analgesic spray: Ethyl chloride. Joint: right subacromial injection Approached in typical fashion with: posterior approach Completed without difficulty Meds: 3 cc lidocaine 2% no epi, 1 cc depomedrol 70m/cc Needle:1.5 inch 25 gauge Aftercare instructions and Red flags advised. Immediate improvement in pain noted  - methylPREDNISolone acetate (DEPO-MEDROL) injection 80 mg  CDorothyann Peng NP

## 2022-01-17 DIAGNOSIS — Z23 Encounter for immunization: Secondary | ICD-10-CM | POA: Diagnosis not present

## 2022-01-18 ENCOUNTER — Encounter: Payer: Self-pay | Admitting: Adult Health

## 2022-03-20 ENCOUNTER — Ambulatory Visit (HOSPITAL_COMMUNITY): Payer: Medicare Other | Attending: Internal Medicine

## 2022-03-20 DIAGNOSIS — R011 Cardiac murmur, unspecified: Secondary | ICD-10-CM | POA: Diagnosis not present

## 2022-03-20 LAB — ECHOCARDIOGRAM COMPLETE
Area-P 1/2: 3.92 cm2
S' Lateral: 2.3 cm

## 2022-03-22 ENCOUNTER — Telehealth: Payer: Self-pay | Admitting: Internal Medicine

## 2022-03-22 NOTE — Telephone Encounter (Signed)
Will route to MD to verify.   Thank you!

## 2022-03-22 NOTE — Telephone Encounter (Signed)
Patient states her husband is in his final stages of life and she does not want to leave him if she doesn't have to. She would like to know if there is any way she can have 12/12 appointment with Dr. Margaretann Loveless converted to virtual if at all possible.

## 2022-03-23 NOTE — Telephone Encounter (Signed)
Patient called again to follow-up on changing her in-office visit on Tuesday, 12/12 to a tele-visit.  Patient stated she does not have a computer.

## 2022-03-23 NOTE — Telephone Encounter (Signed)
Returned call to patient who cannot do video visit. Rescheduled patient to January 12th 2024 for 6 month follow up. Patient would like to get Echo results prior to that appointment if possible. Advised patient I would forward message to Dr. Margaretann Loveless. Patient verbalized understanding.

## 2022-03-23 NOTE — Telephone Encounter (Signed)
Attempted to call patient, left message for patient to call back to office.   

## 2022-03-23 NOTE — Telephone Encounter (Signed)
Pt returning call

## 2022-03-27 ENCOUNTER — Ambulatory Visit: Payer: Medicare Other | Admitting: Internal Medicine

## 2022-04-01 ENCOUNTER — Other Ambulatory Visit: Payer: Self-pay | Admitting: Adult Health

## 2022-04-01 DIAGNOSIS — E118 Type 2 diabetes mellitus with unspecified complications: Secondary | ICD-10-CM

## 2022-04-01 DIAGNOSIS — Z760 Encounter for issue of repeat prescription: Secondary | ICD-10-CM

## 2022-04-06 DIAGNOSIS — N3001 Acute cystitis with hematuria: Secondary | ICD-10-CM | POA: Diagnosis not present

## 2022-04-27 ENCOUNTER — Encounter: Payer: Self-pay | Admitting: Internal Medicine

## 2022-04-27 ENCOUNTER — Ambulatory Visit: Payer: Medicare Other | Attending: Internal Medicine | Admitting: Internal Medicine

## 2022-04-27 VITALS — BP 120/60 | HR 66 | Ht 63.0 in | Wt 182.0 lb

## 2022-04-27 DIAGNOSIS — I10 Essential (primary) hypertension: Secondary | ICD-10-CM | POA: Diagnosis not present

## 2022-04-27 DIAGNOSIS — I200 Unstable angina: Secondary | ICD-10-CM | POA: Diagnosis not present

## 2022-04-27 DIAGNOSIS — R079 Chest pain, unspecified: Secondary | ICD-10-CM | POA: Insufficient documentation

## 2022-04-27 DIAGNOSIS — R011 Cardiac murmur, unspecified: Secondary | ICD-10-CM | POA: Insufficient documentation

## 2022-04-27 DIAGNOSIS — E781 Pure hyperglyceridemia: Secondary | ICD-10-CM | POA: Diagnosis not present

## 2022-04-27 NOTE — Patient Instructions (Signed)
Medication Instructions:  No Changes In Medications at this time.  *If you need a refill on your cardiac medications before your next appointment, please call your pharmacy*  Follow-Up: At [facility], you and your health needs are our priority.  As part of our continuing mission to provide you with exceptional heart care, we have created designated Provider Care Teams.  These Care Teams include your primary Cardiologist (physician) and Advanced Practice Providers (APPs -  Physician Assistants and Nurse Practitioners) who all work together to provide you with the care you need, when you need it.  We recommend signing up for the patient portal called "MyChart".  Sign up information is provided on this After Visit Summary.  MyChart is used to connect with patients for Virtual Visits (Telemedicine).  Patients are able to view lab/test results, encounter notes, upcoming appointments, etc.  Non-urgent messages can be sent to your provider as well.   To learn more about what you can do with MyChart, go to [email or web address].    Your next appointment:   1 year(s)  Provider:   Elouise Munroe, MD

## 2022-04-27 NOTE — Progress Notes (Signed)
Cardiology Office Note:    Date:  04/27/2022   ID:  [name], DOB 1950-10-10, MRN [id number]  PCP:  [name], NP  Cardiologist:  Elouise Munroe, MD  Electrophysiologist:  None   Referring MD: [name], NP   Chief Complaint/Reason for Referral: Chest pressure  History of Present Illness:    [name] is a 72 y.o. female with a history of HTN, HLD, asthma, DM2, hypothyroidism who presents for evaluation of chest pressure, with ETT performed.  At her last visit, we discussed results of cath which showed mild nonobstructive CAD and elevated LVEDP. Amlodipine was added for possible vasospasm/endothelial dysfunction. We discussed keeping nitro on hand going forward especially on her next trip in the event she has emotionally triggered chest pain since nitro helps.   Today, she reports she has been having a hard time due to her husband passing in the past month. I expressed my condolences, and recalled that I had the opportunity to meet him at our last visit. Grief has made it difficult for her to leave the house. Her and her daughter are planning to take a bereavement class. No significant chest pain with emotional stress lately.   She is compliant with her amlodipine, baby aspirin, losartan, and atorvastatin. She reports having only taken the nitroglycerine about 7 times in the last year. She reports that she had one episode where she had to take 2 doses of nitro. She usually gets headaches when she has episodes and tends to drink caffeine to help.   She has not been monitoring her blood pressure or blood sugar at home.   We discussed her EKG today, no significant findings or changes.   She has been trying to incorporate more vegetables and salads into her diet.  She denies any palpitations, chest pain, shortness of breath, or peripheral edema. No lightheadedness, syncope, orthopnea, or PND.   Past Medical History:  Diagnosis Date   Allergy     Anemia    Anxiety    ANXIETY DEPRESSION 09/15/2007   Arthritis    ASTHMA 05/15/2009   Asthma    DIABETES MELLITUS, TYPE II 12/11/2006   DIVERTICULOSIS, COLON 12/11/2006   Edema 01/15/2009   Gout    pt denies; toe was broken   Headache(784.0) 12/11/2006   Hyperlipidemia    HYPERTENSION NEC 08/14/2007   HYPOTHYROIDISM 12/11/2006   PELVIC PAIN, CHRONIC 08/14/2007   Restless leg syndrome     Past Surgical History:  Procedure Laterality Date   ABDOMINAL HYSTERECTOMY     APPENDECTOMY     Bladder Suspension     LEFT HEART CATH AND CORONARY ANGIOGRAPHY N/A 09/05/2021   Procedure: LEFT HEART CATH AND CORONARY ANGIOGRAPHY;  Surgeon: Troy Sine, MD;  Location: [facility];  Service: Cardiovascular;  Laterality: N/A;    Current Medications: Current Meds  Medication Sig   acetaminophen (TYLENOL) 500 MG tablet Take 1,000 mg by mouth every 8 (eight) hours as needed for moderate pain.   ALPRAZolam (XANAX) 0.25 MG tablet Take 1 tablet (0.25 mg total) by mouth 2 (two) times daily as needed for anxiety.   amLODipine (NORVASC) 5 MG tablet Take 1 tablet (5 mg total) by mouth daily.   aspirin 81 MG tablet Take 81 mg by mouth at bedtime.   atorvastatin (LIPITOR) 10 MG tablet TAKE 1 TABLET(10 MG) BY MOUTH DAILY   blood glucose meter kit and supplies KIT Dispense based on patient and insurance preference. Use up to four  times daily as directed.   Carboxymethylcellul-Glycerin (LUBRICATING EYE DROPS OP) Place 1 drop into both eyes daily as needed (dry eyes).   cholecalciferol (VITAMIN D3) 25 MCG (1000 UT) tablet Take 1,000 Units by mouth daily.   citalopram (CELEXA) 20 MG tablet TAKE 1 TABLET(20 MG) BY MOUTH DAILY   Emollient (GOLD BOND DIABETICS DRY SKIN) CREA Apply 1 application. topically at bedtime.   glucose blood test strip Use to check blood glucose TID   Lancets 28G MISC Use to check blood glucose 4 times daily   levothyroxine (SYNTHROID) 88 MCG tablet TAKE 1 TABLET(88 MCG) BY MOUTH  DAILY   Lidocaine 4 % AERO Apply 1 spray topically at bedtime as needed (foot pain).   losartan-hydrochlorothiazide (HYZAAR) 100-25 MG tablet TAKE 1 TABLET BY MOUTH DAILY   metFORMIN (GLUCOPHAGE) 500 MG tablet TAKE 1 TABLET(500 MG) BY MOUTH TWICE DAILY WITH A MEAL   pramipexole (MIRAPEX) 1.5 MG tablet TAKE 1 TABLET(1.5 MG) BY MOUTH AT BEDTIME   Prenatal Vit-Fe Fumarate-FA (PRENATAL VITAMIN PO) Take 1 tablet by mouth daily.   vitamin B-12 (CYANOCOBALAMIN) 1000 MCG tablet Take 1,000 mcg by mouth daily.     Allergies:   Benadryl [diphenhydramine hcl], Ace inhibitors, Amoxicillin, Aspirin, Codeine, Fish allergy, Hydrocodone, Indomethacin, Pentazocine lactate, Promethazine hcl, Quinine, and Chlorphen-phenyleph-methscop   Social History   Tobacco Use   Smoking status: Never   Smokeless tobacco: Never  Substance Use Topics   Alcohol use: Yes    Alcohol/week: 0.0 standard drinks of alcohol    Comment: very rare   Drug use: No     Family History: The patient's family history includes Breast cancer (age of onset: 61) in an other family member; COPD in her father and mother; Diabetes in her father, mother, sister, sister, sister, and sister; Diabetes Mellitus II in her sister; Lung cancer (age of onset: 47) in her nephew. There is no history of Colon cancer, Esophageal cancer, or Pancreatic cancer.  ROS:   Please see the history of present illness.    (+) headaches All other systems reviewed and are negative.  EKGs/Labs/Other Studies Reviewed:    The following studies were reviewed today:  EKG:  EKG is personally reviewed. 04/27/2022: Sinus rhythm. Rate 66 bpm.  Prior: sinus bradycardia rate 59  Imaging studies that I have independently reviewed today: n/a  Echo 03/20/2022: IMPRESSIONS   1. Left ventricular ejection fraction, by estimation, is 65 to 70%. Left  ventricular ejection fraction by 3D volume is 68 %. The left ventricle has  hyperdynamic function. The left ventricle has no  regional wall motion  abnormalities. Left ventricular  diastolic parameters are consistent with Grade I diastolic dysfunction  (impaired relaxation). The average left ventricular global longitudinal  strain is -24.9 %. The global longitudinal strain is normal.   2. Right ventricular systolic function is normal. The right ventricular  size is normal. There is normal pulmonary artery systolic pressure. The  estimated right ventricular systolic pressure is 53.9 mmHg.   3. The mitral valve is normal in structure. Trivial mitral valve  regurgitation. No evidence of mitral stenosis.   4. The aortic valve is tricuspid. Aortic valve regurgitation is trivial.  No aortic stenosis is present.   5. The inferior vena cava is normal in size with greater than 50%  respiratory variability, suggesting right atrial pressure of 3 mmHg.   cardiac catheterization 09/05/21 - mild CAD in LAD  Recent Labs: 05/09/2021: ALT 14; BUN 15; Creatinine, Ser 0.89; Hemoglobin 12.6; Platelets 252.0;  Potassium 4.1; Sodium 140; TSH 0.89  Recent Lipid Panel    Component Value Date/Time   CHOL 112 05/09/2021 1336   TRIG 180.0 (H) 05/09/2021 1336   HDL 46.50 05/09/2021 1336   CHOLHDL 2 05/09/2021 1336   VLDL 36.0 05/09/2021 1336   LDLCALC 29 05/09/2021 1336   LDLCALC 67 11/03/2019 0810   LDLDIRECT 68.0 10/29/2018 1048    Physical Exam:    VS:  BP 120/60 (BP Location: Right Arm, Patient Position: Sitting, Cuff Size: Normal)   Pulse 66   Ht '5\' 3"'$  (1.6 m)   Wt 182 lb (82.6 kg)   BMI 32.24 kg/m     Wt Readings from Last 5 Encounters:  04/27/22 182 lb (82.6 kg)  01/04/22 183 lb 4.8 oz (83.1 kg)  11/07/21 182 lb (82.6 kg)  09/21/21 182 lb 9.6 oz (82.8 kg)  09/05/21 184 lb (83.5 kg)    Constitutional: No acute distress Eyes: sclera non-icteric, normal conjunctiva and lids ENMT: normal dentition, moist mucous membranes Cardiovascular: regular rhythm, normal rate, faint systolic murmur. S1 and S2 normal. No jugular  venous distention.  Respiratory: clear to auscultation bilaterally GI : normal bowel sounds, soft and nontender. No distention.   MSK: extremities warm, well perfused. No edema.  NEURO: grossly nonfocal exam, moves all extremities. PSYCH: alert and oriented x 3, normal mood and affect.   ASSESSMENT:    1. Pure hypertriglyceridemia   2. Murmur   3. Chest pain of uncertain etiology   4. Unstable angina ([place])   5. Primary hypertension      PLAN:    Pure hypertriglyceridemia - Plan: EKG 12-Lead  Murmur  Chest pain of uncertain etiology  Unstable angina ([facility])  Primary hypertension  - episode of unstable angina in the office that was nitro responsive. Risk factors for CAD include HTN, HLD, DM2 HbA1c 6.9%. Chest pain with emotional stressors. Cath showed mild CAD and presumed coronary vasospasm as source of chest pain. Started on amlodipine with improvement in symptoms and she can continue prn nitro. If increasing nitro use, start imdur. - continue statin, ASA 81 mg daily, and losartan HCTZ.  - systolic murmur faint and no source noted on recent echo. Follow clinically.  -dietary adjustments and reassess lipids with pcp. LDL at goal.  Total time of encounter: 30 minutes total time of encounter, including 20 minutes spent in face-to-face patient care on the date of this encounter. This time includes coordination of care and counseling regarding above mentioned problem list. Remainder of non-face-to-face time involved reviewing chart documents/testing relevant to the patient encounter and documentation in the medical record. I have independently reviewed documentation from referring provider.   Cherlynn Kaiser, MD, [place]    Shared Decision Making/Informed Consent:     Medication Adjustments/Labs and Tests Ordered: Current medicines are reviewed at length with the patient today.  Concerns regarding medicines are outlined above.   Orders Placed This  Encounter  Procedures   EKG 12-Lead    No orders of the defined types were placed in this encounter.   Patient Instructions  Medication Instructions:  No Changes In Medications at this time.  *If you need a refill on your cardiac medications before your next appointment, please call your pharmacy*  Follow-Up: At [facility], you and your health needs are our priority.  As part of our continuing mission to provide you with exceptional heart care, we have created designated Provider Care Teams.  These Care Teams include  your primary Cardiologist (physician) and Advanced Practice Providers (APPs -  Physician Assistants and Nurse Practitioners) who all work together to provide you with the care you need, when you need it.  We recommend signing up for the patient portal called "MyChart".  Sign up information is provided on this After Visit Summary.  MyChart is used to connect with patients for Virtual Visits (Telemedicine).  Patients are able to view lab/test results, encounter notes, upcoming appointments, etc.  Non-urgent messages can be sent to your provider as well.   To learn more about what you can do with MyChart, go to [email or web address].    Your next appointment:   1 year(s)  Provider:   Elouise Munroe, MD       I,Rachel Rivera,acting as a scribe for Elouise Munroe, MD.,have documented all relevant documentation on the behalf of Elouise Munroe, MD,as directed by  Elouise Munroe, MD while in the presence of Elouise Munroe, MD.  I, Elouise Munroe, MD, have reviewed all documentation for the visit on 04/27/2022. The documentation on today's date of service for the exam, diagnosis, procedures, and orders are all accurate and complete.

## 2022-05-01 ENCOUNTER — Other Ambulatory Visit: Payer: Self-pay | Admitting: Adult Health

## 2022-05-01 DIAGNOSIS — Z1231 Encounter for screening mammogram for malignant neoplasm of breast: Secondary | ICD-10-CM

## 2022-05-04 ENCOUNTER — Ambulatory Visit
Admission: RE | Admit: 2022-05-04 | Discharge: 2022-05-04 | Disposition: A | Discharge status: Home / Self Care | Payer: Medicare Other | Source: Ambulatory Visit | Attending: Adult Health | Admitting: Adult Health

## 2022-05-04 DIAGNOSIS — Z1231 Encounter for screening mammogram for malignant neoplasm of breast: Secondary | ICD-10-CM

## 2022-05-11 ENCOUNTER — Ambulatory Visit (INDEPENDENT_AMBULATORY_CARE_PROVIDER_SITE_OTHER): Payer: Medicare Other | Admitting: Adult Health

## 2022-05-11 ENCOUNTER — Encounter: Payer: Self-pay | Admitting: Adult Health

## 2022-05-11 ENCOUNTER — Encounter: Payer: Medicare Other | Admitting: Adult Health

## 2022-05-11 VITALS — BP 130/62 | HR 73 | Temp 98.7°F | Ht 63.0 in | Wt 180.0 lb

## 2022-05-11 DIAGNOSIS — G2581 Restless legs syndrome: Secondary | ICD-10-CM

## 2022-05-11 DIAGNOSIS — E118 Type 2 diabetes mellitus with unspecified complications: Secondary | ICD-10-CM | POA: Diagnosis not present

## 2022-05-11 DIAGNOSIS — E559 Vitamin D deficiency, unspecified: Secondary | ICD-10-CM | POA: Diagnosis not present

## 2022-05-11 DIAGNOSIS — F32A Depression, unspecified: Secondary | ICD-10-CM | POA: Diagnosis not present

## 2022-05-11 DIAGNOSIS — I10 Essential (primary) hypertension: Secondary | ICD-10-CM | POA: Diagnosis not present

## 2022-05-11 DIAGNOSIS — I200 Unstable angina: Secondary | ICD-10-CM

## 2022-05-11 DIAGNOSIS — E038 Other specified hypothyroidism: Secondary | ICD-10-CM

## 2022-05-11 DIAGNOSIS — N898 Other specified noninflammatory disorders of vagina: Secondary | ICD-10-CM

## 2022-05-11 DIAGNOSIS — F419 Anxiety disorder, unspecified: Secondary | ICD-10-CM | POA: Diagnosis not present

## 2022-05-11 DIAGNOSIS — E782 Mixed hyperlipidemia: Secondary | ICD-10-CM | POA: Diagnosis not present

## 2022-05-11 DIAGNOSIS — R600 Localized edema: Secondary | ICD-10-CM | POA: Diagnosis not present

## 2022-05-11 LAB — VITAMIN D 25 HYDROXY (VIT D DEFICIENCY, FRACTURES): VITD: 40.11 ng/mL (ref 30.00–100.00)

## 2022-05-11 LAB — COMPREHENSIVE METABOLIC PANEL
ALT: 14 U/L (ref 0–35)
AST: 13 U/L (ref 0–37)
Albumin: 4 g/dL (ref 3.5–5.2)
Alkaline Phosphatase: 81 U/L (ref 39–117)
BUN: 16 mg/dL (ref 6–23)
CO2: 29 mEq/L (ref 19–32)
Calcium: 9.3 mg/dL (ref 8.4–10.5)
Chloride: 101 mEq/L (ref 96–112)
Creatinine, Ser: 0.85 mg/dL (ref 0.40–1.20)
GFR: 69.67 mL/min (ref >60.00)
Glucose, Bld: 134 mg/dL — ABNORMAL HIGH (ref 70–99)
Potassium: 3.9 mEq/L (ref 3.5–5.1)
Sodium: 140 mEq/L (ref 135–145)
Total Bilirubin: 0.6 mg/dL (ref 0.2–1.2)
Total Protein: 6.7 g/dL (ref 6.0–8.3)

## 2022-05-11 LAB — CBC WITH DIFFERENTIAL/PLATELET
Basophils Absolute: 0 10*3/uL (ref 0.0–0.1)
Basophils Relative: 0.5 % (ref 0.0–3.0)
Eosinophils Absolute: 0.2 10*3/uL (ref 0.0–0.7)
Eosinophils Relative: 2.5 % (ref 0.0–5.0)
HCT: 36.3 % (ref 36.0–46.0)
Hemoglobin: 12.1 g/dL (ref 12.0–15.0)
Lymphocytes Relative: 24.3 % (ref 12.0–46.0)
Lymphs Abs: 1.9 10*3/uL (ref 0.7–4.0)
MCHC: 33.3 g/dL (ref 30.0–36.0)
MCV: 88.7 fl (ref 78.0–100.0)
Monocytes Absolute: 0.4 10*3/uL (ref 0.1–1.0)
Monocytes Relative: 4.7 % (ref 3.0–12.0)
Neutro Abs: 5.3 10*3/uL (ref 1.4–7.7)
Neutrophils Relative %: 68 % (ref 43.0–77.0)
Platelets: 279 10*3/uL (ref 150.0–400.0)
RBC: 4.09 Mil/uL (ref 3.87–5.11)
RDW: 14.2 % (ref 11.5–15.5)
WBC: 7.9 10*3/uL (ref 4.0–10.5)

## 2022-05-11 LAB — LIPID PANEL
Cholesterol: 109 mg/dL (ref 0–200)
HDL: 50 mg/dL (ref >39.00)
LDL Cholesterol: 28 mg/dL (ref 0–99)
NonHDL: 58.66
Total CHOL/HDL Ratio: 2
Triglycerides: 154 mg/dL — ABNORMAL HIGH (ref 0.0–149.0)
VLDL: 30.8 mg/dL (ref 0.0–40.0)

## 2022-05-11 LAB — HEMOGLOBIN A1C: Hgb A1c MFr Bld: 7.4 % — ABNORMAL HIGH (ref 4.6–6.5)

## 2022-05-11 LAB — MICROALBUMIN / CREATININE URINE RATIO
Creatinine,U: 87.9 mg/dL
Microalb Creat Ratio: 0.8 mg/g (ref 0.0–30.0)
Microalb, Ur: <0.7 mg/dL (ref 0.0–1.9)

## 2022-05-11 LAB — TSH: TSH: 1.27 u[IU]/mL (ref 0.35–5.50)

## 2022-05-11 MED ORDER — CITALOPRAM HYDROBROMIDE 20 MG PO TABS: ORAL_TABLET | ORAL | 1 refills | Status: DC

## 2022-05-11 MED ORDER — PRAMIPEXOLE DIHYDROCHLORIDE 1.5 MG PO TABS: ORAL_TABLET | ORAL | 1 refills | Status: DC

## 2022-05-11 MED ORDER — LOSARTAN POTASSIUM-HCTZ 100-25 MG PO TABS: 1.0000 | ORAL_TABLET | Freq: Every day | ORAL | 3 refills | Status: DC

## 2022-05-11 MED ORDER — FUROSEMIDE 20 MG PO TABS: ORAL_TABLET | ORAL | 0 refills | Status: AC

## 2022-05-11 MED ORDER — ATORVASTATIN CALCIUM 10 MG PO TABS: ORAL_TABLET | ORAL | 3 refills | Status: DC

## 2022-05-11 MED ORDER — AMLODIPINE BESYLATE 5 MG PO TABS: 5.0000 mg | ORAL_TABLET | Freq: Every day | ORAL | 11 refills | Status: DC

## 2022-05-11 NOTE — Progress Notes (Signed)
Subjective:    Patient ID: [name], female    DOB: 06/27/1950, 72 y.o.   MRN: [id number]  HPI Patient presents for yearly preventative medicine examination. She is a pleasant 72 year old female who  has a past medical history of Allergy, Anemia, Anxiety, ANXIETY DEPRESSION (09/15/2007), Arthritis, ASTHMA (05/15/2009), Asthma, DIABETES MELLITUS, TYPE II (12/11/2006), DIVERTICULOSIS, COLON (12/11/2006), Edema (01/15/2009), Gout, Headache(784.0) (12/11/2006), Hyperlipidemia, HYPERTENSION NEC (08/14/2007), HYPOTHYROIDISM (12/11/2006), PELVIC PAIN, CHRONIC (08/14/2007), and Restless leg syndrome.  Her husband recently passed away from bladder cancer.  She and her daughter having a hard time with his death.  She has joined a support group for widows at her church and is going through bereavement therapy at hospice.  She is taking a month-long trip starting middle of February to go up to [place] and see friends and family.  DM -currently prescribed metformin 500 mg twice daily.  Diet has been erratic since her husband's death and she is not checking her blood sugars on a routine basis Lab Results  Component Value Date   HGBA1C 7.0 (A) 11/07/2021    Hypothyroidism-controlled with Synthroid 88 mcg daily Lab Results  Component Value Date   TSH 0.89 05/09/2021   Essential hypertension-takes Hyzaar 100-25 mg daily.  She denies chest pain, shortness of breath, dizziness, lightheadedness, or headaches.  Blood pressures at home have been in the 120s to high 140s over 60s to 80s.  She has been under a lot of stress lately due to her husband's health BP Readings from Last 3 Encounters:  05/11/22 130/62  04/27/22 120/60  01/11/22 138/80   Restless leg syndrome-takes 1.5 mg of Mirapex nightly.  She does feel well controlled on this medication  Anxiety and depression-she has been taking 20 mg of Celexa. Anxiety and depression have increased over the last few months.   Hyperlipidemia-prescribed  lipitor 10 mg daily.  She denies myalgia or fatigue Lab Results  Component Value Date   CHOL 112 05/09/2021   HDL 46.50 05/09/2021   LDLCALC 29 05/09/2021   LDLDIRECT 68.0 10/29/2018   TRIG 180.0 (H) 05/09/2021   CHOLHDL 2 05/09/2021   Vitamin D Deficiency - takes 1000 units daily   Lower extremity edema -he has been experiencing some lower extremity edema which is not uncommon for her.  In the past we have prescribed her short courses of Lasix to take as needed.  Vaginal mass-he reports that for the last year she has noticed a small mass on the inside of her vagina that she would like to have evaluated.  She has never brought this up before.  She reports itchiness pain with palpation.  She does not have a GYN. Mass is smaller than a marble.    All immunizations and health maintenance protocols were reviewed with the patient and needed orders were placed.  Appropriate screening laboratory values were ordered for the patient including screening of hyperlipidemia, renal function and hepatic function.  Medication reconciliation,  past medical history, social history, problem list and allergies were reviewed in detail with the patient  Goals were established with regard to weight loss, exercise, and  diet in compliance with medications Wt Readings from Last 3 Encounters:  05/11/22 180 lb (81.6 kg)  04/27/22 182 lb (82.6 kg)  01/04/22 183 lb 4.8 oz (83.1 kg)    Review of Systems  Constitutional: Negative.   HENT: Negative.    Eyes: Negative.   Respiratory: Negative.    Cardiovascular:  Positive for leg swelling.  Gastrointestinal: Negative.   Endocrine: Negative.   Genitourinary:  Positive for vaginal pain.  Musculoskeletal:  Positive for arthralgias.  Skin: Negative.   Allergic/Immunologic: Negative.   Neurological: Negative.   Hematological: Negative.   Psychiatric/Behavioral:  Positive for dysphoric mood and sleep disturbance. Negative for suicidal ideas. The patient is  nervous/anxious.    Past Medical History:  Diagnosis Date   Allergy    Anemia    Anxiety    ANXIETY DEPRESSION 09/15/2007   Arthritis    ASTHMA 05/15/2009   Asthma    DIABETES MELLITUS, TYPE II 12/11/2006   DIVERTICULOSIS, COLON 12/11/2006   Edema 01/15/2009   Gout    pt denies; toe was broken   Headache(784.0) 12/11/2006   Hyperlipidemia    HYPERTENSION NEC 08/14/2007   HYPOTHYROIDISM 12/11/2006   PELVIC PAIN, CHRONIC 08/14/2007   Restless leg syndrome     Social History   Socioeconomic History   Marital status: Widowed    Spouse name: Not on file   Number of children: Not on file   Years of education: Not on file   Highest education level: 12th grade  Occupational History   Not on file  Tobacco Use   Smoking status: Never   Smokeless tobacco: Never  Substance and Sexual Activity   Alcohol use: Yes    Alcohol/week: 0.0 standard drinks of alcohol    Comment: very rare   Drug use: No   Sexual activity: Not on file  Other Topics Concern   Not on file  Social History Narrative   Not on file   Social Determinants of Health   Financial Resource Strain: Low Risk  (01/04/2022)   Overall Financial Resource Strain (CARDIA)    Difficulty of Paying Living Expenses: Not hard at all  Food Insecurity: No Food Insecurity (01/04/2022)   Hunger Vital Sign    Worried About Running Out of Food in the Last Year: Never true    Ran Out of Food in the Last Year: Never true  Transportation Needs: No Transportation Needs (01/04/2022)   PRAPARE - Hydrologist (Medical): No    Lack of Transportation (Non-Medical): No  Physical Activity: Inactive (01/04/2022)   Exercise Vital Sign    Days of Exercise per Week: 0 days    Minutes of Exercise per Session: 0 min  Stress: No Stress Concern Present (01/04/2022)   [place]    Feeling of Stress : Not at all  Social Connections: [place]  (01/04/2022)   Social Connection and Isolation Panel [NHANES]    Frequency of Communication with Friends and Family: More than three times a week    Frequency of Social Gatherings with Friends and Family: More than three times a week    Attends Religious Services: More than 4 times per year    Active Member of [employer] or Organizations: Yes    Attends Archivist Meetings: More than 4 times per year    Marital Status: Married  Human resources officer Violence: Not At Risk (01/04/2022)   Humiliation, Afraid, Rape, and Kick questionnaire    Fear of Current or Ex-Partner: No    Emotionally Abused: No    Physically Abused: No    Sexually Abused: No    Past Surgical History:  Procedure Laterality Date   ABDOMINAL HYSTERECTOMY     APPENDECTOMY     Bladder Suspension     LEFT HEART CATH AND CORONARY ANGIOGRAPHY N/A 09/05/2021  Procedure: LEFT HEART CATH AND CORONARY ANGIOGRAPHY;  Surgeon: Troy Sine, MD;  Location: [facility];  Service: Cardiovascular;  Laterality: N/A;    Family History  Problem Relation Age of Onset   COPD Mother        smoker   Diabetes Mother    COPD Father        smoker   Diabetes Father    Diabetes Sister    Diabetes Mellitus II Sister    Diabetes Sister    Diabetes Sister    Diabetes Sister    Breast cancer Other 74   Lung cancer Nephew 60   Colon cancer Neg Hx    Esophageal cancer Neg Hx    Pancreatic cancer Neg Hx     Allergies  Allergen Reactions   Benadryl [Diphenhydramine Hcl] Anaphylaxis   Ace Inhibitors Cough   Amoxicillin     Unknown reaction   Aspirin Nausea And Vomiting    Tolerates low dose aspirin    Codeine Nausea And Vomiting   Fish Allergy Nausea And Vomiting   Hydrocodone Nausea And Vomiting   Indomethacin     Unknown reaction    Pentazocine Lactate     passing out   Promethazine Hcl     Unknown reaction    Quinine     itching redface   Chlorphen-Phenyleph-Methscop Rash    Current Outpatient Medications on  File Prior to Visit  Medication Sig Dispense Refill   acetaminophen (TYLENOL) 500 MG tablet Take 1,000 mg by mouth every 8 (eight) hours as needed for moderate pain.     ALPRAZolam (XANAX) 0.25 MG tablet Take 1 tablet (0.25 mg total) by mouth 2 (two) times daily as needed for anxiety. 60 tablet 0   amLODipine (NORVASC) 5 MG tablet Take 1 tablet (5 mg total) by mouth daily. 30 tablet 11   aspirin 81 MG tablet Take 81 mg by mouth at bedtime.     atorvastatin (LIPITOR) 10 MG tablet TAKE 1 TABLET(10 MG) BY MOUTH DAILY 90 tablet 3   blood glucose meter kit and supplies KIT Dispense based on patient and insurance preference. Use up to four times daily as directed. 1 each 0   Carboxymethylcellul-Glycerin (LUBRICATING EYE DROPS OP) Place 1 drop into both eyes daily as needed (dry eyes).     cholecalciferol (VITAMIN D3) 25 MCG (1000 UT) tablet Take 1,000 Units by mouth daily.     citalopram (CELEXA) 20 MG tablet TAKE 1 TABLET(20 MG) BY MOUTH DAILY 90 tablet 1   Emollient (GOLD BOND DIABETICS DRY SKIN) CREA Apply 1 application. topically at bedtime.     glucose blood test strip Use to check blood glucose TID 200 each 0   Lancets 28G MISC Use to check blood glucose 4 times daily 100 each 0   levothyroxine (SYNTHROID) 88 MCG tablet TAKE 1 TABLET(88 MCG) BY MOUTH DAILY 90 tablet 3   Lidocaine 4 % AERO Apply 1 spray topically at bedtime as needed (foot pain).     losartan-hydrochlorothiazide (HYZAAR) 100-25 MG tablet TAKE 1 TABLET BY MOUTH DAILY 90 tablet 1   metFORMIN (GLUCOPHAGE) 500 MG tablet TAKE 1 TABLET(500 MG) BY MOUTH TWICE DAILY WITH A MEAL 180 tablet 0   pramipexole (MIRAPEX) 1.5 MG tablet TAKE 1 TABLET(1.5 MG) BY MOUTH AT BEDTIME 90 tablet 1   Prenatal Vit-Fe Fumarate-FA (PRENATAL VITAMIN PO) Take 1 tablet by mouth daily.     vitamin B-12 (CYANOCOBALAMIN) 1000 MCG tablet Take 1,000 mcg by mouth  daily.     nitroGLYCERIN (NITROSTAT) 0.4 MG SL tablet Place 1 tablet (0.4 mg total) under the tongue  every 5 (five) minutes as needed for chest pain. 25 tablet 3   No current facility-administered medications on file prior to visit.    BP 130/62   Pulse 73   Temp 98.7 F (37.1 C) (Oral)   Ht '5\' 3"'$  (1.6 m)   Wt 180 lb (81.6 kg)   SpO2 95%   BMI 31.89 kg/m       Objective:   Physical Exam Vitals and nursing note reviewed.  Constitutional:      General: She is not in acute distress.    Appearance: Normal appearance. She is obese. She is not ill-appearing.  HENT:     Head: Normocephalic and atraumatic.     Right Ear: Tympanic membrane, ear canal and external ear normal. There is no impacted cerumen.     Left Ear: Tympanic membrane, ear canal and external ear normal. There is no impacted cerumen.     Nose: Nose normal. No congestion or rhinorrhea.     Mouth/Throat:     Mouth: Mucous membranes are moist.     Pharynx: Oropharynx is clear.  Eyes:     Extraocular Movements: Extraocular movements intact.     Conjunctiva/sclera: Conjunctivae normal.     Pupils: Pupils are equal, round, and reactive to light.  Neck:     Vascular: No carotid bruit.  Cardiovascular:     Rate and Rhythm: Normal rate and regular rhythm.     Pulses: Normal pulses.     Heart sounds: No murmur heard.    No friction rub. No gallop.  Pulmonary:     Effort: Pulmonary effort is normal.     Breath sounds: Normal breath sounds.  Abdominal:     General: Abdomen is flat. Bowel sounds are normal. There is no distension.     Palpations: Abdomen is soft. There is no mass.     Tenderness: There is no abdominal tenderness. There is no guarding or rebound.     Hernia: No hernia is present.  Genitourinary:    Comments: Deferred  Musculoskeletal:        General: Normal range of motion.     Cervical back: Normal range of motion and neck supple.     Right lower leg: Edema present.     Left lower leg: Edema present.  Lymphadenopathy:     Cervical: No cervical adenopathy.  Skin:    General: Skin is warm and  dry.     Capillary Refill: Capillary refill takes less than 2 seconds.  Neurological:     General: No focal deficit present.     Mental Status: She is alert and oriented to person, place, and time.  Psychiatric:        Mood and Affect: Mood is depressed. Affect is tearful.        Behavior: Behavior normal. Behavior is cooperative.        Thought Content: Thought content normal.        Cognition and Memory: Cognition and memory normal.        Judgment: Judgment normal.        Assessment & Plan:  1. Essential hypertension - Well controlled. No change in medication  - CBC with Differential/Platelet; Future - Comprehensive metabolic panel; Future - Lipid panel; Future - TSH; Future - Hemoglobin A1c; Future - Microalbumin / creatinine urine ratio; Future - Microalbumin / creatinine urine ratio -  Hemoglobin A1c - TSH - Lipid panel - Comprehensive metabolic panel - CBC with Differential/Platelet  2. Controlled type 2 diabetes mellitus with complication, without long-term current use of insulin ([place]) - Consider increase in Metformin  - CBC with Differential/Platelet; Future - Comprehensive metabolic panel; Future - Lipid panel; Future - TSH; Future - Hemoglobin A1c; Future - Microalbumin / creatinine urine ratio; Future - Microalbumin / creatinine urine ratio - Hemoglobin A1c - TSH - Lipid panel - Comprehensive metabolic panel - CBC with Differential/Platelet  3. Mixed hyperlipidemia - Consider increase in statin  - CBC with Differential/Platelet; Future - Comprehensive metabolic panel; Future - Lipid panel; Future - TSH; Future - Hemoglobin A1c; Future - Hemoglobin A1c - TSH - Lipid panel - Comprehensive metabolic panel - CBC with Differential/Platelet  4. Anxiety and depression - Discussed changing medication but she would like to stay on Celexa 20 mg daily  - Encouraged participation in various support groups  -I think it is a wonderful idea that she is taking  a month to travel up to [place].  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 Restless leg syndrome - Continue with Mirapex  - CBC with Differential/Platelet; Future - Comprehensive metabolic panel; Future - Lipid panel; Future - TSH; Future - Hemoglobin A1c; Future - Hemoglobin A1c - TSH - Lipid panel - Comprehensive metabolic panel - CBC with Differential/Platelet  6. Other specified hypothyroidism - Consider increase in synthroid  - CBC with Differential/Platelet; Future - Comprehensive metabolic panel; Future - Lipid panel; Future - TSH; Future - Hemoglobin A1c; Future - Hemoglobin A1c - TSH - Lipid panel - Comprehensive metabolic panel - CBC with Differential/Platelet  7. Vitamin D deficiency  - VITAMIN D 25 Hydroxy (Vit-D Deficiency, Fractures); Future - VITAMIN D 25 Hydroxy (Vit-D Deficiency, Fractures)  8. Lower extremity edema  - furosemide (LASIX) 20 MG tablet; Take one tab daily x 3 days as needed for swelling in legs  Dispense: 90 tablet; Refill: 0  9. Vaginal mass  - Ambulatory referral to Gynecology  [name], NP

## 2022-05-15 DIAGNOSIS — H52223 Regular astigmatism, bilateral: Secondary | ICD-10-CM | POA: Diagnosis not present

## 2022-05-15 DIAGNOSIS — H2513 Age-related nuclear cataract, bilateral: Secondary | ICD-10-CM | POA: Diagnosis not present

## 2022-05-15 DIAGNOSIS — E119 Type 2 diabetes mellitus without complications: Secondary | ICD-10-CM | POA: Diagnosis not present

## 2022-05-15 DIAGNOSIS — H524 Presbyopia: Secondary | ICD-10-CM | POA: Diagnosis not present

## 2022-05-15 DIAGNOSIS — H25013 Cortical age-related cataract, bilateral: Secondary | ICD-10-CM | POA: Diagnosis not present

## 2022-05-15 LAB — HM DIABETES EYE EXAM

## 2022-05-22 ENCOUNTER — Other Ambulatory Visit: Payer: Self-pay | Admitting: Adult Health

## 2022-05-22 DIAGNOSIS — F419 Anxiety disorder, unspecified: Secondary | ICD-10-CM

## 2022-05-25 ENCOUNTER — Ambulatory Visit (INDEPENDENT_AMBULATORY_CARE_PROVIDER_SITE_OTHER): Payer: Medicare Other | Admitting: Adult Health

## 2022-05-25 VITALS — BP 130/60 | HR 71 | Temp 98.4°F | Ht 63.0 in | Wt 179.0 lb

## 2022-05-25 DIAGNOSIS — G8929 Other chronic pain: Secondary | ICD-10-CM

## 2022-05-25 DIAGNOSIS — I200 Unstable angina: Secondary | ICD-10-CM

## 2022-05-25 DIAGNOSIS — M25511 Pain in right shoulder: Secondary | ICD-10-CM | POA: Diagnosis not present

## 2022-05-25 MED ORDER — METHYLPREDNISOLONE ACETATE 80 MG/ML IJ SUSP
80.0000 mg | Freq: Once | INTRAMUSCULAR | Status: AC
  Administered 2022-05-25: 80 mg via INTRA_ARTICULAR

## 2022-05-25 NOTE — Progress Notes (Signed)
.   Subjective:    Patient ID: [name], female    DOB: [birth year], 72 y.o.   MRN: [id number]  Shoulder Pain     72 year old female who  has a past medical history of Allergy, Anemia, Anxiety, ANXIETY DEPRESSION (09/15/2007), Arthritis, ASTHMA (05/15/2009), Asthma, DIABETES MELLITUS, TYPE II (12/11/2006), DIVERTICULOSIS, COLON (12/11/2006), Edema (01/15/2009), Gout, Headache(784.0) (12/11/2006), Hyperlipidemia, HYPERTENSION NEC (08/14/2007), HYPOTHYROIDISM (12/11/2006), PELVIC PAIN, CHRONIC (08/14/2007), and Restless leg syndrome.  Presents to the office today with a complaint of chronic right shoulder pain. She last had a steroid injection in September 2023 and reports that this steroid injected worked well for her and she was able to get her ROM back and decreased the amount of pain.    She would like another injection today as her pain has come back. She is getting ready to drive to [place] an stay for a month     Review of Systems See HPI   Past Medical History:  Diagnosis Date   Allergy    Anemia    Anxiety    ANXIETY DEPRESSION 09/15/2007   Arthritis    ASTHMA 05/15/2009   Asthma    DIABETES MELLITUS, TYPE II 12/11/2006   DIVERTICULOSIS, COLON 12/11/2006   Edema 01/15/2009   Gout    pt denies; toe was broken   Headache(784.0) 12/11/2006   Hyperlipidemia    HYPERTENSION NEC 08/14/2007   HYPOTHYROIDISM 12/11/2006   PELVIC PAIN, CHRONIC 08/14/2007   Restless leg syndrome     Social History   Socioeconomic History   Marital status: Widowed    Spouse name: Not on file   Number of children: Not on file   Years of education: Not on file   Highest education level: 12th grade  Occupational History   Not on file  Tobacco Use   Smoking status: Never   Smokeless tobacco: Never  Substance and Sexual Activity   Alcohol use: Yes    Alcohol/week: 0.0 standard drinks of alcohol    Comment: very rare   Drug use: No   Sexual activity: Not on file  Other Topics Concern    Not on file  Social History Narrative   Not on file   Social Determinants of Health   Financial Resource Strain: Low Risk  (05/21/2022)   Overall Financial Resource Strain (CARDIA)    Difficulty of Paying Living Expenses: Not hard at all  Food Insecurity: Food Insecurity Present (05/21/2022)   Hunger Vital Sign    Worried About Running Out of Food in the Last Year: Sometimes true    Ran Out of Food in the Last Year: Sometimes true  Transportation Needs: No Transportation Needs (05/21/2022)   PRAPARE - Hydrologist (Medical): No    Lack of Transportation (Non-Medical): No  Physical Activity: Insufficiently Active (05/21/2022)   Exercise Vital Sign    Days of Exercise per Week: 2 days    Minutes of Exercise per Session: 10 min  Stress: Stress Concern Present (05/21/2022)   [place]    Feeling of Stress : To some extent  Social Connections: Moderately Integrated (05/21/2022)   Social Connection and Isolation Panel [NHANES]    Frequency of Communication with Friends and Family: More than three times a week    Frequency of Social Gatherings with Friends and Family: More than three times a week    Attends Religious Services: More than 4 times per year  Active Member of Clubs or Organizations: Yes    Attends Archivist Meetings: More than 4 times per year    Marital Status: Widowed  Intimate Partner Violence: Not At Risk (01/04/2022)   Humiliation, Afraid, Rape, and Kick questionnaire    Fear of Current or Ex-Partner: No    Emotionally Abused: No    Physically Abused: No    Sexually Abused: No    Past Surgical History:  Procedure Laterality Date   ABDOMINAL HYSTERECTOMY     APPENDECTOMY     Bladder Suspension     LEFT HEART CATH AND CORONARY ANGIOGRAPHY N/A 09/05/2021   Procedure: LEFT HEART CATH AND CORONARY ANGIOGRAPHY;  Surgeon: Troy Sine, MD;  Location: [facility];   Service: Cardiovascular;  Laterality: N/A;    Family History  Problem Relation Age of Onset   COPD Mother        smoker   Diabetes Mother    COPD Father        smoker   Diabetes Father    Diabetes Sister    Diabetes Mellitus II Sister    Diabetes Sister    Diabetes Sister    Diabetes Sister    Breast cancer Other 55   Lung cancer Nephew 60   Colon cancer Neg Hx    Esophageal cancer Neg Hx    Pancreatic cancer Neg Hx     Allergies  Allergen Reactions   Benadryl [Diphenhydramine Hcl] Anaphylaxis   Ace Inhibitors Cough   Amoxicillin     Unknown reaction   Aspirin Nausea And Vomiting    Tolerates low dose aspirin    Codeine Nausea And Vomiting   Fish Allergy Nausea And Vomiting   Hydrocodone Nausea And Vomiting   Indomethacin     Unknown reaction    Pentazocine Lactate     passing out   Promethazine Hcl     Unknown reaction    Quinine     itching redface   Chlorphen-Phenyleph-Methscop Rash    Current Outpatient Medications on File Prior to Visit  Medication Sig Dispense Refill   acetaminophen (TYLENOL) 500 MG tablet Take 1,000 mg by mouth every 8 (eight) hours as needed for moderate pain.     ALPRAZolam (XANAX) 0.25 MG tablet Take 1 tablet (0.25 mg total) by mouth 2 (two) times daily as needed for anxiety. 60 tablet 0   amLODipine (NORVASC) 5 MG tablet Take 1 tablet (5 mg total) by mouth daily. 30 tablet 11   aspirin 81 MG tablet Take 81 mg by mouth at bedtime.     atorvastatin (LIPITOR) 10 MG tablet TAKE 1 TABLET(10 MG) BY MOUTH DAILY 90 tablet 3   blood glucose meter kit and supplies KIT Dispense based on patient and insurance preference. Use up to four times daily as directed. 1 each 0   Carboxymethylcellul-Glycerin (LUBRICATING EYE DROPS OP) Place 1 drop into both eyes daily as needed (dry eyes).     cholecalciferol (VITAMIN D3) 25 MCG (1000 UT) tablet Take 1,000 Units by mouth daily.     citalopram (CELEXA) 20 MG tablet TAKE 1 TABLET(20 MG) BY MOUTH DAILY 90  tablet 1   Emollient (GOLD BOND DIABETICS DRY SKIN) CREA Apply 1 application. topically at bedtime.     furosemide (LASIX) 20 MG tablet Take one tab daily x 3 days as needed for swelling in legs 90 tablet 0   glucose blood test strip Use to check blood glucose TID 200 each 0  Lancets 28G MISC Use to check blood glucose 4 times daily 100 each 0   levothyroxine (SYNTHROID) 88 MCG tablet TAKE 1 TABLET(88 MCG) BY MOUTH DAILY 90 tablet 3   Lidocaine 4 % AERO Apply 1 spray topically at bedtime as needed (foot pain).     losartan-hydrochlorothiazide (HYZAAR) 100-25 MG tablet Take 1 tablet by mouth daily. 90 tablet 3   metFORMIN (GLUCOPHAGE) 500 MG tablet TAKE 1 TABLET(500 MG) BY MOUTH TWICE DAILY WITH A MEAL 180 tablet 0   pramipexole (MIRAPEX) 1.5 MG tablet TAKE 1 TABLET(1.5 MG) BY MOUTH AT BEDTIME 90 tablet 1   Prenatal Vit-Fe Fumarate-FA (PRENATAL VITAMIN PO) Take 1 tablet by mouth daily.     vitamin B-12 (CYANOCOBALAMIN) 1000 MCG tablet Take 1,000 mcg by mouth daily.     nitroGLYCERIN (NITROSTAT) 0.4 MG SL tablet Place 1 tablet (0.4 mg total) under the tongue every 5 (five) minutes as needed for chest pain. 25 tablet 3   No current facility-administered medications on file prior to visit.    BP 130/60   Pulse 71   Temp 98.4 F (36.9 C) (Oral)   Ht 5' 3"$  (1.6 m)   Wt 179 lb (81.2 kg)   SpO2 96%   BMI 31.71 kg/m       Objective:   Physical Exam Vitals and nursing note reviewed.  Constitutional:      Appearance: Normal appearance.  Musculoskeletal:     Right shoulder: Bony tenderness and crepitus present. Decreased range of motion. Decreased strength.  Skin:    General: Skin is warm and dry.     Capillary Refill: Capillary refill takes less than 2 seconds.  Neurological:     General: No focal deficit present.     Mental Status: She is alert and oriented to person, place, and time.  Psychiatric:        Mood and Affect: Mood normal.        Behavior: Behavior normal.         Thought Content: Thought content normal.        Judgment: Judgment normal.        Assessment & Plan:  1. Chronic right shoulder pain Shoulder injection Verbal consent obtained and verified. Sterile betadine prep. Furthur cleansed with alcohol. Topical analgesic spray: Ethyl chloride. Joint: right subacromial injection Approached in typical fashion with: posterior approach Completed without difficulty Meds: 3 cc lidocaine 2% no epi, 1 cc depomedrol 52m/cc Needle:1.5 inch 25 gauge Aftercare instructions and Red flags advised. Immediate improvement in pain noted  - methylPREDNISolone acetate (DEPO-MEDROL) injection 80 mg  CDorothyann Peng NP

## 2022-06-18 NOTE — Progress Notes (Signed)
72 y.o. No obstetric history on file. Widowed Caucasian female here for NEW GYN/vaginal bump.  Pt states it burns and itches occasionally, externally. Pt stated odor and itching internally as well for over a year.  No discharge.   No vaginal bleeding.   Daughter is present for the discussion portion of the visit.   No liquid draining from the lump.   Uses a pad due to urinary incontinence.  Takes a diuretic.   Status post hysterectomy with bladder suspension at age 46.  Dr. Philis Pique.  States her ovaries could not be seen at the time of surgery, so they remain.   Avoiding irritants.  Using feminine wash or Dove.   Husband has passed away in 24-Apr-2023.  Patient lives next door to her daughter.  They [facility] grief counseling through [facility].   PCP:   [name], NP  No LMP recorded. Patient has had a hysterectomy.           Sexually active: No.  The current method of family planning is status post hysterectomy.    Exercising: Yes.     walking Smoker:  no  Health Maintenance: Pap:  unsure History of abnormal Pap:  no MMG:  05/04/22 Breast Density Category B, BI-RADS CATEGORY 1 neg Colonoscopy:  10/08/17 BMD:   10/15/18  Result  normal TDaP:  unsure Gardasil:   no HIV: n/a Hep C: n/a Screening Labs:  PCP   reports that she has never smoked. She has never used smokeless tobacco. She reports current alcohol use. She reports that she does not use drugs.  Past Medical History:  Diagnosis Date   Allergy    Anemia    Anxiety    ANXIETY DEPRESSION 09/15/2007   Arthritis    ASTHMA 05/15/2009   Asthma    DIABETES MELLITUS, TYPE II 12/11/2006   DIVERTICULOSIS, COLON 12/11/2006   Edema 01/15/2009   Gout    pt denies; toe was broken   Headache(784.0) 12/11/2006   Hyperlipidemia    HYPERTENSION NEC 08/14/2007   HYPOTHYROIDISM 12/11/2006   PELVIC PAIN, CHRONIC 08/14/2007   Restless leg syndrome     Past Surgical History:  Procedure Laterality Date   ABDOMINAL HYSTERECTOMY      APPENDECTOMY     Bladder Suspension     LEFT HEART CATH AND CORONARY ANGIOGRAPHY N/A 09/05/2021   Procedure: LEFT HEART CATH AND CORONARY ANGIOGRAPHY;  Surgeon: Troy Sine, MD;  Location: [facility];  Service: Cardiovascular;  Laterality: N/A;    Current Outpatient Medications  Medication Sig Dispense Refill   acetaminophen (TYLENOL) 500 MG tablet Take 1,000 mg by mouth every 8 (eight) hours as needed for moderate pain.     ALPRAZolam (XANAX) 0.25 MG tablet Take 1 tablet (0.25 mg total) by mouth 2 (two) times daily as needed for anxiety. 60 tablet 0   amLODipine (NORVASC) 5 MG tablet Take 1 tablet (5 mg total) by mouth daily. 30 tablet 11   aspirin 81 MG tablet Take 81 mg by mouth at bedtime.     atorvastatin (LIPITOR) 10 MG tablet TAKE 1 TABLET(10 MG) BY MOUTH DAILY 90 tablet 3   blood glucose meter kit and supplies KIT Dispense based on patient and insurance preference. Use up to four times daily as directed. 1 each 0   Carboxymethylcellul-Glycerin (LUBRICATING EYE DROPS OP) Place 1 drop into both eyes daily as needed (dry eyes).     cholecalciferol (VITAMIN D3) 25 MCG (1000 UT) tablet Take 1,000 Units by  mouth daily.     citalopram (CELEXA) 20 MG tablet TAKE 1 TABLET(20 MG) BY MOUTH DAILY 90 tablet 1   Emollient (GOLD BOND DIABETICS DRY SKIN) CREA Apply 1 application. topically at bedtime.     furosemide (LASIX) 20 MG tablet Take one tab daily x 3 days as needed for swelling in legs 90 tablet 0   glucose blood test strip Use to check blood glucose TID 200 each 0   Lancets 28G MISC Use to check blood glucose 4 times daily 100 each 0   levothyroxine (SYNTHROID) 88 MCG tablet TAKE 1 TABLET(88 MCG) BY MOUTH DAILY 90 tablet 3   Lidocaine 4 % AERO Apply 1 spray topically at bedtime as needed (foot pain).     losartan-hydrochlorothiazide (HYZAAR) 100-25 MG tablet TAKE 1 TABLET BY MOUTH DAILY 90 tablet 3   metFORMIN (GLUCOPHAGE) 500 MG tablet TAKE 1 TABLET(500 MG) BY MOUTH TWICE DAILY  WITH A MEAL 180 tablet 0   pramipexole (MIRAPEX) 1.5 MG tablet TAKE 1 TABLET(1.5 MG) BY MOUTH AT BEDTIME 90 tablet 1   Prenatal Vit-Fe Fumarate-FA (PRENATAL VITAMIN PO) Take 1 tablet by mouth daily.     vitamin B-12 (CYANOCOBALAMIN) 1000 MCG tablet Take 1,000 mcg by mouth daily.     nitroGLYCERIN (NITROSTAT) 0.4 MG SL tablet Place 1 tablet (0.4 mg total) under the tongue every 5 (five) minutes as needed for chest pain. 25 tablet 3   No current facility-administered medications for this visit.    Family History  Problem Relation Age of Onset   COPD Mother        smoker   Diabetes Mother    COPD Father        smoker   Diabetes Father    Diabetes Sister    Diabetes Mellitus II Sister    Diabetes Sister    Diabetes Sister    Diabetes Sister    Breast cancer Other 76   Lung cancer Nephew 60   Colon cancer Neg Hx    Esophageal cancer Neg Hx    Pancreatic cancer Neg Hx     Review of Systems  Genitourinary:        Vaginal itching and odor  All other systems reviewed and are negative.   Exam:   BP 124/78 (BP Location: Right Arm, Patient Position: Sitting, Cuff Size: Large)   Pulse 68   Ht 5' 3.5" (1.613 m)   Wt 181 lb (82.1 kg)   SpO2 97%   BMI 31.56 kg/m     General appearance: alert, cooperative and appears stated age Head: normocephalic, without obvious abnormality, atraumatic Lungs: clear to auscultation bilaterally Heart: regular rate and rhythm Abdomen: soft, non-tender; no masses, no organomegaly Neurologic: grossly normal  Pelvic: External genitalia:  whitish skin change of the superior labia minora.  Ecchymoses of the left labia minora and thickening of the superior labia minora nonspecificially.  No mass noted.               No abnormal inguinal nodes palpated.              Urethra:  normal appearing urethra with no masses, tenderness or lesions              Bartholins and Skenes: normal                 Vagina: normal appearing vagina with normal color and  discharge, no lesions  Cervix: absent              Bimanual Exam:  Uterus:  absent              Adnexa: no mass, fullness, tenderness              Rectal exam: yes.  Confirms.              Anus:  normal sphincter tone, no lesions  Chaperone was present for exam:  Emily  Assessment:   Vulvovaginitis.  Vulvar lesion.  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  Status post hysterectomy and bladder suspension.  Ovaries remain.   Plan: Vaginitis swab sent to lab.  We discussed lichen sclerosus and treatment options of Valisone and clobetasol ointment.  Rx for Valisone ointment.  Instructed in use.  I recommend return for a vulvar biopsy.  Rationale explained to make a clear diagnosis and rule out dysplasia adjacent to the lichen sclerosus.  Follow up annually and prn.   After visit summary provided.   32 min  total time was spent for this patient encounter, including preparation, face-to-face counseling with the patient, coordination of care, and documentation of the encounter.

## 2022-06-29 ENCOUNTER — Other Ambulatory Visit: Payer: Self-pay | Admitting: Adult Health

## 2022-06-29 DIAGNOSIS — I10 Essential (primary) hypertension: Secondary | ICD-10-CM

## 2022-06-29 DIAGNOSIS — Z760 Encounter for issue of repeat prescription: Secondary | ICD-10-CM

## 2022-06-29 DIAGNOSIS — E118 Type 2 diabetes mellitus with unspecified complications: Secondary | ICD-10-CM

## 2022-07-02 ENCOUNTER — Encounter: Payer: Self-pay | Admitting: Obstetrics and Gynecology

## 2022-07-02 ENCOUNTER — Ambulatory Visit (INDEPENDENT_AMBULATORY_CARE_PROVIDER_SITE_OTHER): Payer: Medicare Other | Admitting: Obstetrics and Gynecology

## 2022-07-02 ENCOUNTER — Other Ambulatory Visit (HOSPITAL_COMMUNITY)
Admission: RE | Admit: 2022-07-02 | Discharge: 2022-07-02 | Disposition: A | Discharge status: Home / Self Care | Payer: Medicare Other | Source: Ambulatory Visit | Attending: Obstetrics and Gynecology | Admitting: Obstetrics and Gynecology

## 2022-07-02 VITALS — BP 124/78 | HR 68 | Ht 63.5 in | Wt 181.0 lb

## 2022-07-02 DIAGNOSIS — N760 Acute vaginitis: Secondary | ICD-10-CM

## 2022-07-02 DIAGNOSIS — N9089 Other specified noninflammatory disorders of vulva and perineum: Secondary | ICD-10-CM

## 2022-07-02 MED ORDER — BETAMETHASONE VALERATE 0.1 % EX OINT: 1.0000 | TOPICAL_OINTMENT | Freq: Two times a day (BID) | CUTANEOUS | 0 refills | Status: AC

## 2022-07-02 NOTE — Patient Instructions (Signed)
Lichen Sclerosus Lichen sclerosus is a skin problem. It can happen on any part of the body, but it commonly involves the anal and genital areas. It can cause itching and discomfort in these areas. Treatment can help to control symptoms. When the genital area is affected, getting treatment is important because the condition can cause scarring that may lead to other problems if left untreated. What are the causes? The cause of this condition is not known. It may be related to an overactive immune system or a lack of certain hormones. Lichen sclerosus is not an infection or a fungus, and it is not passed from one person to another (non-contagious). What increases the risk? The following factors may make you more likely to develop this condition: You are a woman who has reached menopause. You are a man who was not circumcised. This condition may also develop for the first time in children, usually before they enter puberty. What are the signs or symptoms? Symptoms of this condition include: White areas (plaques) on the skin that may be thin and wrinkled, or thickened. Red and swollen patches (lesions) on the skin. Tears or cracks in the skin. Bruising. Blood blisters. Severe itching. Pain, itching, or burning when urinating. Constipation is also common in children with lichen sclerosus, but can be seen in adults. How is this diagnosed? This condition may be diagnosed with a physical exam. In some cases, a tissue sample may be removed to be checked under a microscope (biopsy). How is this treated? This condition may be treated with: Topical steroids. These are medicated creams or ointments that are applied over the affected areas. Medicines that are taken by mouth. Topical immunotherapy. These are medicated creams or ointments that are applied over the affected areas. They stimulate your immune system to fight the skin condition. This may be used if steroids are not effective. Surgery. This may  be needed in more severe cases that are causing problems such as scarring. Follow these instructions at home: Medicines Take over-the-counter and prescription medicines only as told by your health care provider. Use creams or ointments as told by your health care provider. Skin care Do not scratch the affected areas of skin. If you are a woman, be sure to keep the vaginal area as clean and dry as possible. Clean the affected area of skin gently with water only. Pat skin dry and avoid the use of rough towels or toilet paper. Avoid irritating skin products, including soap and scented lotions. Use emollient creams as directed by your health care provider to help reduce itching. General instructions Keep all follow-up visits. This is important. Your condition may cause constipation. To prevent or treat constipation, you may need to: Drink enough fluid to keep your urine pale yellow. Take over-the-counter or prescription medicines. Eat foods that are high in fiber, such as beans, whole grains, and fresh fruits and vegetables. Limit foods that are high in fat and processed sugars, such as fried or sweet foods. Contact a health care provider if: You have increasing redness, swelling, or pain in the affected area. You have fluid, blood, or pus coming from the affected area. You have new lesions on your skin. You have a fever. You have pain during sex. Get help right away if: You develop severe pain or burning in the affected areas, especially in the genital area. Summary Lichen sclerosus is a skin problem. When the genital area is affected, getting treatment is important because the condition can cause scarring that may   lead to other problems if left untreated. This condition is usually treated with medicated creams or ointments (topical steroids) that are applied over the affected areas. Take or use over-the-counter and prescription medicines only as told by your health care provider. Contact a  health care provider if you have new lesions on your skin, have pain during sex, or have increasing redness, swelling, or pain in the affected area. Keep all follow-up visits. This is important. This information is not intended to replace advice given to you by your health care provider. Make sure you discuss any questions you have with your health care provider. Document Revised: 08/15/2019 Document Reviewed: 08/15/2019 Elsevier Patient Education  2023 Elsevier Inc.  

## 2022-07-03 LAB — CERVICOVAGINAL ANCILLARY ONLY
Bacterial Vaginitis (gardnerella): NEGATIVE
Candida Glabrata: NEGATIVE
Candida Vaginitis: NEGATIVE
Comment: NEGATIVE
Comment: NEGATIVE
Comment: NEGATIVE
Comment: NEGATIVE
Trichomonas: NEGATIVE

## 2022-07-11 NOTE — Progress Notes (Signed)
GYNECOLOGY  VISIT   HPI: 72 y.o.   Widowed  Caucasian  female   G1P0 with No LMP recorded. Patient has had a hysterectomy.   here for  vulvar biopsy.   Using Valisone on her vulva, and her symptoms of vulvar itching ad burning are improved.  She has not previously done a vulvar biopsy.   She decided to take a Xanax in preparation for this visit today. Her daughter here in the waiting room and is driving her home.  Her daughter accompanied the patient for her last office visit as well.   GYNECOLOGIC HISTORY: No LMP recorded. Patient has had a hysterectomy. Contraception:  hysterectomy Menopausal hormone therapy:  n/a Last mammogram:  05/04/22 Breast Density Category B, BI-RADS CATEGORY 1 neg  Last pap smear:   unsure        OB History     Gravida  1   Para      Term      Preterm      AB      Living  1      SAB      IAB      Ectopic      Multiple      Live Births  1              Patient Active Problem List   Diagnosis Date Noted   Chest pain of uncertain etiology    Fracture of second toe, left, closed, initial encounter 09/26/2017   GERD (gastroesophageal reflux disease) 10/19/2014   Essential hypertension 08/16/2014   Breast pain, right 05/20/2014   Restless leg syndrome 05/20/2014   Hip pain, bilateral 10/23/2011   Knee pain, bilateral 10/23/2011   EDEMA 01/15/2009   Hypothyroidism 12/11/2006   Diabetes type 2, uncontrolled 12/11/2006   DIVERTICULOSIS, COLON 12/11/2006   HEADACHE 12/11/2006    Past Medical History:  Diagnosis Date   Allergy    Anemia    Anxiety    ANXIETY DEPRESSION 09/15/2007   Arthritis    ASTHMA 05/15/2009   Asthma    DIABETES MELLITUS, TYPE II 12/11/2006   DIVERTICULOSIS, COLON 12/11/2006   Edema 01/15/2009   Gout    pt denies; toe was broken   Headache(784.0) 12/11/2006   Hyperlipidemia    HYPERTENSION NEC 08/14/2007   HYPOTHYROIDISM 12/11/2006   PELVIC PAIN, CHRONIC 08/14/2007   Restless leg syndrome     Past  Surgical History:  Procedure Laterality Date   ABDOMINAL HYSTERECTOMY     APPENDECTOMY     Bladder Suspension     LEFT HEART CATH AND CORONARY ANGIOGRAPHY N/A 09/05/2021   Procedure: LEFT HEART CATH AND CORONARY ANGIOGRAPHY;  Surgeon: Lennette BihariKelly, Thomas A, MD;  Location: [facility];  Service: Cardiovascular;  Laterality: N/A;    Current Outpatient Medications  Medication Sig Dispense Refill   acetaminophen (TYLENOL) 500 MG tablet Take 1,000 mg by mouth every 8 (eight) hours as needed for moderate pain.     ALPRAZolam (XANAX) 0.25 MG tablet Take 1 tablet (0.25 mg total) by mouth 2 (two) times daily as needed for anxiety. 60 tablet 0   amLODipine (NORVASC) 5 MG tablet Take 1 tablet (5 mg total) by mouth daily. 30 tablet 11   aspirin 81 MG tablet Take 81 mg by mouth at bedtime.     atorvastatin (LIPITOR) 10 MG tablet TAKE 1 TABLET(10 MG) BY MOUTH DAILY 90 tablet 3   betamethasone valerate ointment (VALISONE) 0.1 % Apply 1 Application topically 2 (  two) times daily. Place in the affected area twice a bid for 2 weeks and then place on the area at night twice weekly. 45 g 0   blood glucose meter kit and supplies KIT Dispense based on patient and insurance preference. Use up to four times daily as directed. 1 each 0   Carboxymethylcellul-Glycerin (LUBRICATING EYE DROPS OP) Place 1 drop into both eyes daily as needed (dry eyes).     cholecalciferol (VITAMIN D3) 25 MCG (1000 UT) tablet Take 1,000 Units by mouth daily.     citalopram (CELEXA) 20 MG tablet TAKE 1 TABLET(20 MG) BY MOUTH DAILY 90 tablet 1   Emollient (GOLD BOND DIABETICS DRY SKIN) CREA Apply 1 application. topically at bedtime.     furosemide (LASIX) 20 MG tablet Take one tab daily x 3 days as needed for swelling in legs 90 tablet 0   glucose blood test strip Use to check blood glucose TID 200 each 0   Lancets 28G MISC Use to check blood glucose 4 times daily 100 each 0   levothyroxine (SYNTHROID) 88 MCG tablet TAKE 1 TABLET(88 MCG) BY  MOUTH DAILY 90 tablet 3   Lidocaine 4 % AERO Apply 1 spray topically at bedtime as needed (foot pain).     losartan-hydrochlorothiazide (HYZAAR) 100-25 MG tablet TAKE 1 TABLET BY MOUTH DAILY 90 tablet 3   metFORMIN (GLUCOPHAGE) 500 MG tablet TAKE 1 TABLET(500 MG) BY MOUTH TWICE DAILY WITH A MEAL 180 tablet 0   pramipexole (MIRAPEX) 1.5 MG tablet TAKE 1 TABLET(1.5 MG) BY MOUTH AT BEDTIME 90 tablet 1   Prenatal Vit-Fe Fumarate-FA (PRENATAL VITAMIN PO) Take 1 tablet by mouth daily.     vitamin B-12 (CYANOCOBALAMIN) 1000 MCG tablet Take 1,000 mcg by mouth daily.     nitroGLYCERIN (NITROSTAT) 0.4 MG SL tablet Place 1 tablet (0.4 mg total) under the tongue every 5 (five) minutes as needed for chest pain. 25 tablet 3   No current facility-administered medications for this visit.     ALLERGIES: Benadryl [diphenhydramine hcl], Ace inhibitors, Amoxicillin, Aspirin, Codeine, Fish allergy, Hydrocodone, Indomethacin, Pentazocine lactate, Promethazine hcl, Quinine, and Chlorphen-phenyleph-methscop  Family History  Problem Relation Age of Onset   COPD Mother        smoker   Diabetes Mother    COPD Father        smoker   Diabetes Father    Diabetes Sister    Diabetes Mellitus II Sister    Diabetes Sister    Diabetes Sister    Diabetes Sister    Breast cancer Other [id number]   Lung cancer Nephew 60   Colon cancer Neg Hx    Esophageal cancer Neg Hx    Pancreatic cancer Neg Hx     Social History   Socioeconomic History   Marital status: Widowed    Spouse name: Not on file   Number of children: Not on file   Years of education: Not on file   Highest education level: 12th grade  Occupational History   Not on file  Tobacco Use   Smoking status: Never   Smokeless tobacco: Never  Vaping Use   Vaping Use: Never used  Substance and Sexual Activity   Alcohol use: Yes    Alcohol/week: 0.0 standard drinks of alcohol    Comment: very rare   Drug use: No   Sexual activity: Not Currently    Comment:  hysterectomy, <5 sexual partners, <18 y/o, no STD  Other Topics Concern   Not on  file  Social History Narrative   Not on file   Social Determinants of Health   Financial Resource Strain: Low Risk  (05/21/2022)   Overall Financial Resource Strain (CARDIA)    Difficulty of Paying Living Expenses: Not hard at all  Food Insecurity: Food Insecurity Present (05/21/2022)   Hunger Vital Sign    Worried About Running Out of Food in the Last Year: Sometimes true    Ran Out of Food in the Last Year: Sometimes true  Transportation Needs: No Transportation Needs (05/21/2022)   PRAPARE - Administrator, Civil Service (Medical): No    Lack of Transportation (Non-Medical): No  Physical Activity: Insufficiently Active (05/21/2022)   Exercise Vital Sign    Days of Exercise per Week: 2 days    Minutes of Exercise per Session: 10 min  Stress: Stress Concern Present (05/21/2022)   [employer] of Occupational Health - Occupational Stress Questionnaire    Feeling of Stress : To some extent  Social Connections: Moderately Integrated (05/21/2022)   Social Connection and Isolation Panel [NHANES]    Frequency of Communication with Friends and Family: More than three times a week    Frequency of Social Gatherings with Friends and Family: More than three times a week    Attends Religious Services: More than 4 times per year    Active Member of [employer] or Organizations: Yes    Attends Banker Meetings: More than 4 times per year    Marital Status: Widowed  Intimate Partner Violence: Not At Risk (01/04/2022)   Humiliation, Afraid, Rape, and Kick questionnaire    Fear of Current or Ex-Partner: No    Emotionally Abused: No    Physically Abused: No    Sexually Abused: No    Review of Systems  All other systems reviewed and are negative.   PHYSICAL EXAMINATION:    BP 118/62   Pulse 62   Wt 178 lb (80.7 kg)   SpO2 100%   BMI 31.04 kg/m     General appearance: alert, cooperative and  appears stated age   Pelvic: External genitalia:  white skin color change to the bilateral labia minora and majora.  O thickening of skin noted today.              Urethra:  normal appearing urethra with no masses, tenderness or lesions   Vulvar biopsy Discussion regarding vulvar biopsy completed at office visit on 07/02/22 and patient signed consent form today.  Sterile prep with Hibiclens.  Local 1% lidocaine, lot [id number], exp 10/2024.  4 mm punch biopsy used.  Tissue to pathology. Single suture of 3/0 Vicryl.  Minimal bleeding.  No complication.   Chaperone was present for exam:  Warren Lacy, CMA  ASSESSMENT  Vulvar lesion.  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   PLAN  Fu biopsy result. Ok to continue [facility] [place] [facility] but do not place on the biopsy site until the suture dissolves. Final plan to follow.

## 2022-07-24 ENCOUNTER — Other Ambulatory Visit: Payer: Self-pay | Admitting: Adult Health

## 2022-07-24 DIAGNOSIS — G2581 Restless legs syndrome: Secondary | ICD-10-CM

## 2022-07-25 ENCOUNTER — Other Ambulatory Visit (HOSPITAL_COMMUNITY)
Admission: RE | Admit: 2022-07-25 | Discharge: 2022-07-25 | Disposition: A | Discharge status: Home / Self Care | Payer: Medicare Other | Source: Ambulatory Visit | Attending: Obstetrics and Gynecology | Admitting: Obstetrics and Gynecology

## 2022-07-25 ENCOUNTER — Encounter: Payer: Self-pay | Admitting: Obstetrics and Gynecology

## 2022-07-25 ENCOUNTER — Ambulatory Visit (INDEPENDENT_AMBULATORY_CARE_PROVIDER_SITE_OTHER): Payer: Medicare Other | Admitting: Obstetrics and Gynecology

## 2022-07-25 DIAGNOSIS — N9089 Other specified noninflammatory disorders of vulva and perineum: Secondary | ICD-10-CM | POA: Insufficient documentation

## 2022-07-25 NOTE — Patient Instructions (Signed)
Vulva Biopsy, Care After After a vulva biopsy, it is common to have: Slight bleeding from the biopsy site. Soreness or slight pain at the biopsy site. Follow these instructions at home: Your doctor may give you more instructions. If you have problems, contact your doctor. Biopsy site care  Follow instructions from your doctor about how to take care of your biopsy site. Make sure you: Clean the area using water and mild soap two times a day or as told by your doctor. Gently pat the area dry. You may shower 24 hours after the procedure. If you were prescribed an antibiotic ointment, apply it as told by your doctor. Do not stop using the antibiotic even if your condition gets better. If told by your doctor, take a warm water bath (sitz bath) to help with pain and soreness. A sitz bath is taken while you are sitting down. Do this as often as told by your doctor. The water should only come up to your hips and cover your butt. You may pat the area dry with a soft, clean towel. Leave stitches (sutures) or skin glue in place for at least 2 weeks. Leave tape strips alone unless you are told to take them off. You may trim the edges of the tape strips if they curl up. Check your biopsy area every day for signs of infection. It may be helpful to use a handheld mirror to do this. Check for: Redness, swelling, or more pain. More fluid or blood. Warmth. Pus or a bad smell. Do not rub the biopsy area after peeing (urinating). Gently pat the area dry, or use a bottle filled with warm water (peri bottle) to clean the area. Gently wipe from front to back. Lifestyle Wear loose, cotton underwear. Do not wear tight pants. For at least 1 week or until your doctor says it is okay: Do not use a tampon, douche, or put anything in your vagina. Do not have sex. Until your doctor says it is okay: Do not exercise. Do not swim or use a hot tub. General instructions Take over-the-counter and prescription  medicines only as told by your doctor. Drink enough fluid to keep your pee (urine) pale yellow. Use a sanitary pad until the bleeding stops. If told, put ice on the biopsy site. To do this: Place ice in a plastic bag. Place a towel between your skin and the bag. Leave the ice on for 20 minutes, 2-3 times a day. Take off the ice if your skin turns bright red. This is very important. If you cannot feel pain, heat, or cold, you have a greater risk of damage to the area. Keep all follow-up visits. Contact a doctor if: You have redness, swelling, or more pain around your biopsy site. You have more fluid or blood coming from your biopsy site. Your biopsy site feels warm when you touch it. Medicines or ice packs do not help with your pain. You have a fever or chills. Get help right away if: You have a lot of bleeding from the vulva. You have pus or a bad smell coming from your biopsy site. You have pain in your belly (abdomen). Summary After the procedure, it is common to have slight bleeding and soreness at the biopsy site. Follow all instructions as told by your doctor. Take sitz baths as told by your doctor. Leave any stitches in place. Check your biopsy site for infection. Signs include redness, swelling, more pain, more fluid or blood, or warmth. Get help   right away if you have a lot of bleeding, pus or a bad smell, or pain in your belly. This information is not intended to replace advice given to you by your health care provider. Make sure you discuss any questions you have with your health care provider. Document Revised: 12/20/2020 Document Reviewed: 12/20/2020 Elsevier Patient Education  2023 Elsevier Inc.  

## 2022-07-27 LAB — SURGICAL PATHOLOGY

## 2022-08-10 ENCOUNTER — Ambulatory Visit: Payer: Medicare Other | Admitting: Adult Health

## 2022-08-14 ENCOUNTER — Encounter: Payer: Self-pay | Admitting: Internal Medicine

## 2022-08-21 ENCOUNTER — Ambulatory Visit (INDEPENDENT_AMBULATORY_CARE_PROVIDER_SITE_OTHER): Payer: Medicare Other | Admitting: Adult Health

## 2022-08-21 ENCOUNTER — Encounter: Payer: Self-pay | Admitting: Adult Health

## 2022-08-21 VITALS — BP 108/60 | HR 88 | Temp 98.2°F | Ht 63.5 in | Wt 182.0 lb

## 2022-08-21 DIAGNOSIS — I10 Essential (primary) hypertension: Secondary | ICD-10-CM

## 2022-08-21 DIAGNOSIS — E118 Type 2 diabetes mellitus with unspecified complications: Secondary | ICD-10-CM | POA: Diagnosis not present

## 2022-08-21 DIAGNOSIS — Z7984 Long term (current) use of oral hypoglycemic drugs: Secondary | ICD-10-CM

## 2022-08-21 LAB — POCT GLYCOSYLATED HEMOGLOBIN (HGB A1C): Hemoglobin A1C: 6.8 % — AB (ref 4.0–5.6)

## 2022-08-21 NOTE — Patient Instructions (Addendum)
Your A1c improved to 6.8   I am not going to make any medication changes  Follow up in three months

## 2022-08-21 NOTE — Progress Notes (Signed)
Subjective:    Patient ID: [name], female    DOB: Sep 25, 1950, 72 y.o.   MRN: [id number]  HPI 72 year old female who  has a past medical history of Allergy, Anemia, Anxiety, ANXIETY DEPRESSION (09/15/2007), Arthritis, ASTHMA (05/15/2009), Asthma, DIABETES MELLITUS, TYPE II (12/11/2006), DIVERTICULOSIS, COLON (12/11/2006), Edema (01/15/2009), Gout, Headache(784.0) (12/11/2006), Hyperlipidemia, HYPERTENSION NEC (08/14/2007), HYPOTHYROIDISM (12/11/2006), PELVIC PAIN, CHRONIC (08/14/2007), and Restless leg syndrome.  DM -currently prescribed metformin 500 mg twice daily. She does not check blood sugar routinely She did check this morning and it was 156. Lab Results  Component Value Date   HGBA1C 7.4 (H) 05/11/2022   Wt Readings from Last 3 Encounters:  08/21/22 182 lb (82.6 kg)  07/25/22 178 lb (80.7 kg)  07/02/22 181 lb (82.1 kg)   Essential hypertension-takes Hyzaar 100-25 mg daily.  She denies chest pain, shortness of breath, dizziness, lightheadedness, or headaches.  Blood pressures at home have been in the 120s to high 140s over 60s to 80s.  She has been under a lot of stress lately due to her husband's health BP Readings from Last 3 Encounters:  08/21/22 108/60  07/25/22 118/62  07/02/22 124/78   Review of Systems See HPI   Past Medical History:  Diagnosis Date   Allergy    Anemia    Anxiety    ANXIETY DEPRESSION 09/15/2007   Arthritis    ASTHMA 05/15/2009   Asthma    DIABETES MELLITUS, TYPE II 12/11/2006   DIVERTICULOSIS, COLON 12/11/2006   Edema 01/15/2009   Gout    pt denies; toe was broken   Headache(784.0) 12/11/2006   Hyperlipidemia    HYPERTENSION NEC 08/14/2007   HYPOTHYROIDISM 12/11/2006   PELVIC PAIN, CHRONIC 08/14/2007   Restless leg syndrome     Social History   Socioeconomic History   Marital status: Widowed    Spouse name: Not on file   Number of children: Not on file   Years of education: Not on file   Highest education level: 12th grade   Occupational History   Not on file  Tobacco Use   Smoking status: Never   Smokeless tobacco: Never  Vaping Use   Vaping Use: Never used  Substance and Sexual Activity   Alcohol use: Yes    Alcohol/week: 0.0 standard drinks of alcohol    Comment: very rare   Drug use: No   Sexual activity: Not Currently    Comment: hysterectomy, <5 sexual partners, <16 y/o, no STD  Other Topics Concern   Not on file  Social History Narrative   Not on file   Social Determinants of Health   Financial Resource Strain: Low Risk  (05/21/2022)   Overall Financial Resource Strain (CARDIA)    Difficulty of Paying Living Expenses: Not hard at all  Food Insecurity: Food Insecurity Present (05/21/2022)   Hunger Vital Sign    Worried About Running Out of Food in the Last Year: Sometimes true    Ran Out of Food in the Last Year: Sometimes true  Transportation Needs: No Transportation Needs (05/21/2022)   PRAPARE - Administrator, Civil Service (Medical): No    Lack of Transportation (Non-Medical): No  Physical Activity: Insufficiently Active (05/21/2022)   Exercise Vital Sign    Days of Exercise per Week: 2 days    Minutes of Exercise per Session: 10 min  Stress: Stress Concern Present (05/21/2022)   [employer] of Occupational Health - Occupational Stress Questionnaire    Feeling of  Stress : To some extent  Social Connections: Moderately Integrated (05/21/2022)   Social Connection and Isolation Panel [NHANES]    Frequency of Communication with Friends and Family: More than three times a week    Frequency of Social Gatherings with Friends and Family: More than three times a week    Attends Religious Services: More than 4 times per year    Active Member of [employer] or Organizations: Yes    Attends Banker Meetings: More than 4 times per year    Marital Status: Widowed  Intimate Partner Violence: Not At Risk (01/04/2022)   Humiliation, Afraid, Rape, and Kick questionnaire    Fear of  Current or Ex-Partner: No    Emotionally Abused: No    Physically Abused: No    Sexually Abused: No    Past Surgical History:  Procedure Laterality Date   ABDOMINAL HYSTERECTOMY     APPENDECTOMY     Bladder Suspension     LEFT HEART CATH AND CORONARY ANGIOGRAPHY N/A 09/05/2021   Procedure: LEFT HEART CATH AND CORONARY ANGIOGRAPHY;  Surgeon: Lennette Bihari, MD;  Location: [facility];  Service: Cardiovascular;  Laterality: N/A;    Family History  Problem Relation Age of Onset   COPD Mother        smoker   Diabetes Mother    COPD Father        smoker   Diabetes Father    Diabetes Sister    Diabetes Mellitus II Sister    Diabetes Sister    Diabetes Sister    Diabetes Sister    Breast cancer Other 7   Lung cancer Nephew 60   Colon cancer Neg Hx    Esophageal cancer Neg Hx    Pancreatic cancer Neg Hx     Allergies  Allergen Reactions   Benadryl [Diphenhydramine Hcl] Anaphylaxis   Ace Inhibitors Cough   Amoxicillin     Unknown reaction   Aspirin Nausea And Vomiting    Tolerates low dose aspirin    Codeine Nausea And Vomiting   Fish Allergy Nausea And Vomiting   Hydrocodone Nausea And Vomiting   Indomethacin     Unknown reaction    Pentazocine Lactate     passing out   Promethazine Hcl     Unknown reaction    Quinine     itching redface   Chlorphen-Phenyleph-Methscop Rash    Current Outpatient Medications on File Prior to Visit  Medication Sig Dispense Refill   acetaminophen (TYLENOL) 500 MG tablet Take 1,000 mg by mouth every 8 (eight) hours as needed for moderate pain.     ALPRAZolam (XANAX) 0.25 MG tablet Take 1 tablet (0.25 mg total) by mouth 2 (two) times daily as needed for anxiety. 60 tablet 0   amLODipine (NORVASC) 5 MG tablet Take 1 tablet (5 mg total) by mouth daily. 30 tablet 11   aspirin 81 MG tablet Take 81 mg by mouth at bedtime.     atorvastatin (LIPITOR) 10 MG tablet TAKE 1 TABLET(10 MG) BY MOUTH DAILY 90 tablet 3   betamethasone  valerate ointment (VALISONE) 0.1 % Apply 1 Application topically 2 (two) times daily. Place in the affected area twice a bid for 2 weeks and then place on the area at night twice weekly. 45 g 0   blood glucose meter kit and supplies KIT Dispense based on patient and insurance preference. Use up to four times daily as directed. 1 each 0   Carboxymethylcellul-Glycerin (LUBRICATING EYE  DROPS OP) Place 1 drop into both eyes daily as needed (dry eyes).     cholecalciferol (VITAMIN D3) 25 MCG (1000 UT) tablet Take 1,000 Units by mouth daily.     citalopram (CELEXA) 20 MG tablet TAKE 1 TABLET(20 MG) BY MOUTH DAILY 90 tablet 1   Emollient (GOLD BOND DIABETICS DRY SKIN) CREA Apply 1 application. topically at bedtime.     furosemide (LASIX) 20 MG tablet Take one tab daily x 3 days as needed for swelling in legs 90 tablet 0   glucose blood test strip Use to check blood glucose TID 200 each 0   Lancets 28G MISC Use to check blood glucose 4 times daily 100 each 0   levothyroxine (SYNTHROID) 88 MCG tablet TAKE 1 TABLET(88 MCG) BY MOUTH DAILY 90 tablet 3   Lidocaine 4 % AERO Apply 1 spray topically at bedtime as needed (foot pain).     losartan-hydrochlorothiazide (HYZAAR) 100-25 MG tablet TAKE 1 TABLET BY MOUTH DAILY 90 tablet 3   metFORMIN (GLUCOPHAGE) 500 MG tablet TAKE 1 TABLET(500 MG) BY MOUTH TWICE DAILY WITH A MEAL 180 tablet 0   nitroGLYCERIN (NITROSTAT) 0.4 MG SL tablet Place 1 tablet (0.4 mg total) under the tongue every 5 (five) minutes as needed for chest pain. 25 tablet 3   pramipexole (MIRAPEX) 1.5 MG tablet TAKE 1 TABLET(1.5 MG) BY MOUTH AT BEDTIME 90 tablet 1   Prenatal Vit-Fe Fumarate-FA (PRENATAL VITAMIN PO) Take 1 tablet by mouth daily.     vitamin B-12 (CYANOCOBALAMIN) 1000 MCG tablet Take 1,000 mcg by mouth daily.     No current facility-administered medications on file prior to visit.    BP 108/60   Pulse 88   Temp 98.2 F (36.8 C) (Oral)   Ht 5' 3.5" (1.613 m)   Wt 182 lb (82.6 kg)    SpO2 94%   BMI 31.73 kg/m       Objective:   Physical Exam Vitals and nursing note reviewed.  Constitutional:      Appearance: Normal appearance. She is obese.  Cardiovascular:     Rate and Rhythm: Normal rate and regular rhythm.     Pulses: Normal pulses.     Heart sounds: Normal heart sounds.  Pulmonary:     Effort: Pulmonary effort is normal.     Breath sounds: Normal breath sounds.  Skin:    General: Skin is warm and dry.  Neurological:     General: No focal deficit present.     Mental Status: She is alert and oriented to person, place, and time.  Psychiatric:        Mood and Affect: Mood normal.        Behavior: Behavior normal.        Thought Content: Thought content normal.        Judgment: Judgment normal.        Assessment & Plan:  1. Essential hypertension - Controlled.  - No change in medication  2. Controlled type 2 diabetes mellitus with complication, without long-term current use of insulin (HCC)  - POC HgB A1c- 6.8  - Has improved  - No change in medication  - Follow up in 3 months   Shirline Frees, NP

## 2022-08-27 ENCOUNTER — Ambulatory Visit (AMBULATORY_SURGERY_CENTER): Payer: Medicare Other

## 2022-08-27 VITALS — Ht 63.5 in | Wt 182.0 lb

## 2022-08-27 DIAGNOSIS — Z86010 Personal history of colonic polyps: Secondary | ICD-10-CM

## 2022-08-27 MED ORDER — NA SULFATE-K SULFATE-MG SULF 17.5-3.13-1.6 GM/177ML PO SOLN: 1.0000 | Freq: Once | ORAL | 0 refills | Status: AC

## 2022-08-27 NOTE — Progress Notes (Signed)
No egg or soy allergy known to patient  No issues known to pt with past sedation with any surgeries or procedures Patient denies ever being told they had issues or difficulty with intubation  No FH of Malignant Hyperthermia Pt is not on diet pills Pt is not on  home 02  Pt is not on blood thinners  Pt denies issues with constipation  No A fib or A flutter Have any cardiac testing pending--no  Pt reports loss of balance at time but is fully ambulatory    PV complete. Prep reviewed. Instructions sent to [facility] and mailing address. Rx sent to [facility] on file.  Pt instructed to use Singlecare.com or GoodRx for a price reduction on prep coupon mailed with paperwork

## 2022-09-20 ENCOUNTER — Ambulatory Visit (AMBULATORY_SURGERY_CENTER): Payer: Medicare Other | Admitting: Internal Medicine

## 2022-09-20 ENCOUNTER — Encounter: Payer: Self-pay | Admitting: Internal Medicine

## 2022-09-20 VITALS — BP 96/50 | HR 62 | Temp 98.6°F | Resp 28 | Ht 63.5 in | Wt 181.0 lb

## 2022-09-20 DIAGNOSIS — F419 Anxiety disorder, unspecified: Secondary | ICD-10-CM | POA: Diagnosis not present

## 2022-09-20 DIAGNOSIS — K5140 Inflammatory polyps of colon without complications: Secondary | ICD-10-CM | POA: Diagnosis not present

## 2022-09-20 DIAGNOSIS — D125 Benign neoplasm of sigmoid colon: Secondary | ICD-10-CM | POA: Diagnosis not present

## 2022-09-20 DIAGNOSIS — Z86010 Personal history of colonic polyps: Secondary | ICD-10-CM

## 2022-09-20 DIAGNOSIS — E039 Hypothyroidism, unspecified: Secondary | ICD-10-CM | POA: Diagnosis not present

## 2022-09-20 DIAGNOSIS — J45909 Unspecified asthma, uncomplicated: Secondary | ICD-10-CM | POA: Diagnosis not present

## 2022-09-20 DIAGNOSIS — Z09 Encounter for follow-up examination after completed treatment for conditions other than malignant neoplasm: Secondary | ICD-10-CM | POA: Diagnosis not present

## 2022-09-20 MED ORDER — SODIUM CHLORIDE 0.9 % IV SOLN
500.0000 mL | Freq: Once | INTRAVENOUS | Status: DC
Start: 2022-09-20 — End: 2022-09-20

## 2022-09-20 NOTE — Progress Notes (Signed)
Pt's states no medical or surgical changes since previsit or office visit. 

## 2022-09-20 NOTE — Patient Instructions (Addendum)
Resume previous diet. Continue present medications. Await pathology results.   YOU HAD AN ENDOSCOPIC PROCEDURE TODAY AT THE [facility]:   Refer to the procedure report that was given to you for any specific questions about what was found during the examination.  If the procedure report does not answer your questions, please call your gastroenterologist to clarify.  If you requested that your care partner not be given the details of your procedure findings, then the procedure report has been included in a sealed envelope for you to review at your convenience later.  YOU SHOULD EXPECT: Some feelings of bloating in the abdomen. Passage of more gas than usual.  Walking can help get rid of the air that was put into your GI tract during the procedure and reduce the bloating. If you had a lower endoscopy (such as a colonoscopy or flexible sigmoidoscopy) you may notice spotting of blood in your stool or on the toilet paper. If you underwent a bowel prep for your procedure, you may not have a normal bowel movement for a few days.  Please Note:  You might notice some irritation and congestion in your nose or some drainage.  This is from the oxygen used during your procedure.  There is no need for concern and it should clear up in a day or so.  SYMPTOMS TO REPORT IMMEDIATELY:  Following lower endoscopy (colonoscopy or flexible sigmoidoscopy):  Excessive amounts of blood in the stool  Significant tenderness or worsening of abdominal pains  Swelling of the abdomen that is new, acute  Fever of 100F or higher   For urgent or emergent issues, a gastroenterologist can be reached at any hour by calling [phone]. Do not use MyChart messaging for urgent concerns.    DIET:  We do recommend a small meal at first, but then you may proceed to your regular diet.  Drink plenty of fluids but you should avoid alcoholic beverages for 24 hours.  ACTIVITY:  You should plan to take it easy for the  rest of today and you should NOT DRIVE or use heavy machinery until tomorrow (because of the sedation medicines used during the test).    FOLLOW UP: Our staff will call the number listed on your records the next business day following your procedure.  We will call around 7:15- 8:00 am to check on you and address any questions or concerns that you may have regarding the information given to you following your procedure. If we do not reach you, we will leave a message.     If any biopsies were taken you will be contacted by phone or by letter within the next 1-3 weeks.  Please call us at [phone] if you have not heard about the biopsies in 3 weeks.    SIGNATURES/CONFIDENTIALITY: You and/or your care partner have signed paperwork which will be entered into your electronic medical record.  These signatures attest to the fact that that the information above on your After Visit Summary has been reviewed and is understood.  Full responsibility of the confidentiality of this discharge information lies with you and/or your care-partner. 

## 2022-09-20 NOTE — Progress Notes (Signed)
HISTORY OF PRESENT ILLNESS:  [name] is a 72 y.o. female with a history of adenomatous colon polyp.  Presents today for surveillance colonoscopy.  No complaints  REVIEW OF SYSTEMS:  All non-GI ROS negative except for  Past Medical History:  Diagnosis Date   Allergy    Anemia    Anxiety    ANXIETY DEPRESSION 09/15/2007   Arthritis    ASTHMA 05/15/2009   Asthma    DIABETES MELLITUS, TYPE II 12/11/2006   DIVERTICULOSIS, COLON 12/11/2006   Edema 01/15/2009   Gout    pt denies; toe was broken   Headache(784.0) 12/11/2006   Hyperlipidemia    Hypertension    HYPERTENSION NEC 08/14/2007   HYPOTHYROIDISM 12/11/2006   PELVIC PAIN, CHRONIC 08/14/2007   Restless leg syndrome     Past Surgical History:  Procedure Laterality Date   ABDOMINAL HYSTERECTOMY     APPENDECTOMY     Bladder Suspension     COLONOSCOPY  10/08/2017   Dr. Marina Goodell   LEFT HEART CATH AND CORONARY ANGIOGRAPHY N/A 09/05/2021   Procedure: LEFT HEART CATH AND CORONARY ANGIOGRAPHY;  Surgeon: Lennette Bihari, MD;  Location: [facility];  Service: Cardiovascular;  Laterality: N/A;    Social History [name]  reports that she has never smoked. She has never used smokeless tobacco. She reports current alcohol use. She reports that she does not use drugs.  family history includes Breast cancer (age of onset: 68) in an other family member; COPD in her father and mother; Diabetes in her father, mother, sister, sister, sister, and sister; Diabetes Mellitus II in her sister; Lung cancer (age of onset: 57) in her nephew.  Allergies  Allergen Reactions   Benadryl [Diphenhydramine Hcl] Anaphylaxis   Ace Inhibitors Cough   Amoxicillin     Unknown reaction   Aspirin Nausea And Vomiting    Tolerates low dose aspirin    Codeine Nausea And Vomiting   Fish Allergy Nausea And Vomiting   Hydrocodone Nausea And Vomiting   Indomethacin     Unknown reaction    Pentazocine Lactate      passing out   Promethazine Hcl     Unknown reaction    Quinine     itching redface   Chlorphen-Phenyleph-Methscop Rash       PHYSICAL EXAMINATION: Vital signs: BP (!) 109/47   Pulse 75   Temp 98.6 F (37 C)   Ht 5' 3.5" (1.613 m)   Wt 181 lb (82.1 kg)   SpO2 94%   BMI 31.56 kg/m  General: Well-developed, well-nourished, no acute distress HEENT: Sclerae are anicteric, conjunctiva pink. Oral mucosa intact Lungs: Clear Heart: Regular Abdomen: soft, nontender, nondistended, no obvious ascites, no peritoneal signs, normal bowel sounds. No organomegaly. Extremities: No edema Psychiatric: alert and oriented x3. Cooperative     ASSESSMENT:  Personal history of adenomatous colon polyps.   PLAN:   Surveillance colonoscopy

## 2022-09-20 NOTE — Progress Notes (Signed)
Called to room to assist during endoscopic procedure.  Patient ID and intended procedure confirmed with present staff. Received instructions for my participation in the procedure from the performing physician.  

## 2022-09-20 NOTE — Progress Notes (Signed)
Sedate, gd SR, tolerated procedure well, VSS, report to RN 

## 2022-09-20 NOTE — Op Note (Signed)
[facility] Patient Name: [name] Procedure Date: 09/20/2022 1:49 PM MRN: [id number] Endoscopist: Wilhemina Bonito. Marina Goodell , MD, [id number] Age: 72 Referring MD:  Date of Birth: [birth year] Gender: Female Account #: [id number] Procedure:                Colonoscopy with hot snare polypectomy x 1 Indications:              High risk colon cancer surveillance: Personal                            history of non-advanced adenoma Medicines:                Monitored Anesthesia Care Procedure:                Pre-Anesthesia Assessment:                           - Prior to the procedure, a History and Physical                            was performed, and patient medications and                            allergies were reviewed. The patient's tolerance of                            previous anesthesia was also reviewed. The risks                            and benefits of the procedure and the sedation                            options and risks were discussed with the patient.                            All questions were answered, and informed consent                            was obtained. Prior Anticoagulants: The patient has                            taken no anticoagulant or antiplatelet agents. ASA                            Grade Assessment: II - A patient with mild systemic                            disease. After reviewing the risks and benefits,                            the patient was deemed in satisfactory condition to                            undergo the procedure.  After obtaining informed consent, the colonoscope                            was passed under direct vision. Throughout the                            procedure, the patient's blood pressure, pulse, and                            oxygen saturations were monitored continuously. The                            Olympus CF-HQ190L [id number]) Colonoscope was                             introduced through the anus and advanced to the the                            cecum, identified by appendiceal orifice and                            ileocecal valve. The ileocecal valve, appendiceal                            orifice, and rectum were photographed. The quality                            of the bowel preparation was excellent. The                            colonoscopy was performed without difficulty. The                            patient tolerated the procedure well. The bowel                            preparation used was SUPREP via split dose                            instruction. Scope In: 1:55:22 PM Scope Out: 2:13:12 PM Scope Withdrawal Time: 0 hours 13 minutes 26 seconds  Total Procedure Duration: 0 hours 17 minutes 50 seconds  Findings:                 A 10 mm polyp was found in the sigmoid colon. The                            polyp was pedunculated. The polyp was removed with                            a hot snare. Resection and retrieval were complete.                           Multiple diverticula were found in the sigmoid  colon and right colon. There was sigmoid stenosis.                           The exam was otherwise without abnormality on                            direct and retroflexion views. Complications:            No immediate complications. Estimated blood loss:                            None. Estimated Blood Loss:     Estimated blood loss: none. Impression:               - One 10 mm polyp in the sigmoid colon, removed                            with a hot snare. Resected and retrieved.                           - Diverticulosis in the sigmoid colon and in the                            right colon. Sigmoid stenosis                           - The examination was otherwise normal on direct                            and retroflexion views. Recommendation:           - Repeat colonoscopy in 5 years for surveillance  if                            the polyp is adenomatous. Otherwise, as needed.                           - Patient has a contact number available for                            emergencies. The signs and symptoms of potential                            delayed complications were discussed with the                            patient. Return to normal activities tomorrow.                            Written discharge instructions were provided to the                            patient.                           - Resume previous diet.                           -  Continue present medications.                           - Await pathology results. Wilhemina Bonito. Marina Goodell, MD 09/20/2022 2:19:42 PM This report has been signed electronically.

## 2022-09-21 ENCOUNTER — Telehealth: Payer: Self-pay | Admitting: *Deleted

## 2022-09-21 NOTE — Telephone Encounter (Signed)
  Follow up Call-     09/20/2022    1:27 PM  Call back number  Post procedure Call Back phone  # [phone]  Permission to leave phone message Yes     Patient questions:  Do you have a fever, pain , or abdominal swelling? No. Pain Score  0 *  Have you tolerated food without any problems? Yes.    Have you been able to return to your normal activities? Yes.    Do you have any questions about your discharge instructions: Diet   No. Medications  No. Follow up visit  No.  Do you have questions or concerns about your Care? No.  Actions: * If pain score is 4 or above: No action needed, pain <4.

## 2022-09-24 ENCOUNTER — Other Ambulatory Visit: Payer: Self-pay | Admitting: Cardiovascular Disease

## 2022-09-24 DIAGNOSIS — I10 Essential (primary) hypertension: Secondary | ICD-10-CM

## 2022-09-25 ENCOUNTER — Encounter: Payer: Self-pay | Admitting: Adult Health

## 2022-09-25 ENCOUNTER — Ambulatory Visit (INDEPENDENT_AMBULATORY_CARE_PROVIDER_SITE_OTHER): Payer: Medicare Other | Admitting: Adult Health

## 2022-09-25 VITALS — BP 120/70 | HR 69 | Temp 98.0°F | Ht 63.5 in | Wt 178.0 lb

## 2022-09-25 DIAGNOSIS — H8113 Benign paroxysmal vertigo, bilateral: Secondary | ICD-10-CM

## 2022-09-25 DIAGNOSIS — R110 Nausea: Secondary | ICD-10-CM

## 2022-09-25 MED ORDER — MECLIZINE HCL 25 MG PO TABS
25.0000 mg | ORAL_TABLET | Freq: Three times a day (TID) | ORAL | 0 refills | Status: DC | PRN
Start: 2022-09-25 — End: 2022-11-20

## 2022-09-25 MED ORDER — ONDANSETRON HCL 4 MG PO TABS: 4.0000 mg | ORAL_TABLET | Freq: Three times a day (TID) | ORAL | 0 refills | Status: DC | PRN

## 2022-09-25 NOTE — Progress Notes (Signed)
Subjective:    Patient ID: [name], female    DOB: [birth year], 72 y.o.   MRN: [id number]  HPI 72 year old female who  has a past medical history of Allergy, Anemia, Anxiety, ANXIETY DEPRESSION (09/15/2007), Arthritis, ASTHMA (05/15/2009), Asthma, DIABETES MELLITUS, TYPE II (12/11/2006), DIVERTICULOSIS, COLON (12/11/2006), Edema (01/15/2009), Gout, Headache(784.0) (12/11/2006), Hyperlipidemia, Hypertension, HYPERTENSION NEC (08/14/2007), HYPOTHYROIDISM (12/11/2006), PELVIC PAIN, CHRONIC (08/14/2007), and Restless leg syndrome.  She has a history of BPPV. Reports that over the last two weeks she has had dizziness and nausea with unsteady gait.  Reports that she becomes dizzy with changing positions and with bending over.  Symptoms started about 2 weeks ago after she became overheated at the beach.  When she came home from the beach she had to start prepping for her colonoscopy and has not been hydrating well since.  She has had 2 falls from the dizziness, the last being today when she fell onto her laundry room door.  She does not have any injuries from the falls.  Not currently taking any medication for vertigo   Review of Systems See HPI   Past Medical History:  Diagnosis Date   Allergy    Anemia    Anxiety    ANXIETY DEPRESSION 09/15/2007   Arthritis    ASTHMA 05/15/2009   Asthma    DIABETES MELLITUS, TYPE II 12/11/2006   DIVERTICULOSIS, COLON 12/11/2006   Edema 01/15/2009   Gout    pt denies; toe was broken   Headache(784.0) 12/11/2006   Hyperlipidemia    Hypertension    HYPERTENSION NEC 08/14/2007   HYPOTHYROIDISM 12/11/2006   PELVIC PAIN, CHRONIC 08/14/2007   Restless leg syndrome     Social History   Socioeconomic History   Marital status: Widowed    Spouse name: Not on file   Number of children: Not on file   Years of education: Not on file   Highest education level: 12th grade  Occupational History   Not on file  Tobacco Use   Smoking status:  Never   Smokeless tobacco: Never  Vaping Use   Vaping Use: Never used  Substance and Sexual Activity   Alcohol use: Yes    Comment: wine occ   Drug use: No   Sexual activity: Not Currently    Comment: hysterectomy, <5 sexual partners, <16 y/o, no STD  Other Topics Concern   Not on file  Social History Narrative   Not on file   Social Determinants of Health   Financial Resource Strain: Low Risk  (05/21/2022)   Overall Financial Resource Strain (CARDIA)    Difficulty of Paying Living Expenses: Not hard at all  Food Insecurity: Food Insecurity Present (05/21/2022)   Hunger Vital Sign    Worried About Running Out of Food in the Last Year: Sometimes true    Ran Out of Food in the Last Year: Sometimes true  Transportation Needs: No Transportation Needs (05/21/2022)   PRAPARE - Administrator, Civil Service (Medical): No    Lack of Transportation (Non-Medical): No  Physical Activity: Insufficiently Active (05/21/2022)   Exercise Vital Sign    Days of Exercise per Week: 2 days    Minutes of Exercise per Session: 10 min  Stress: Stress Concern Present (05/21/2022)   [employer] of Occupational Health - Occupational Stress Questionnaire    Feeling of Stress : To some extent  Social Connections: Moderately Integrated (05/21/2022)   Social Connection and Isolation Panel [NHANES]  Frequency of Communication with Friends and Family: More than three times a week    Frequency of Social Gatherings with Friends and Family: More than three times a week    Attends Religious Services: More than 4 times per year    Active Member of [employer] or Organizations: Yes    Attends Banker Meetings: More than 4 times per year    Marital Status: Widowed  Intimate Partner Violence: Not At Risk (01/04/2022)   Humiliation, Afraid, Rape, and Kick questionnaire    Fear of Current or Ex-Partner: No    Emotionally Abused: No    Physically Abused: No    Sexually Abused: No    Past Surgical  History:  Procedure Laterality Date   ABDOMINAL HYSTERECTOMY     APPENDECTOMY     Bladder Suspension     COLONOSCOPY  10/08/2017   Dr. Marina Goodell   LEFT HEART CATH AND CORONARY ANGIOGRAPHY N/A 09/05/2021   Procedure: LEFT HEART CATH AND CORONARY ANGIOGRAPHY;  Surgeon: Lennette Bihari, MD;  Location: [facility];  Service: Cardiovascular;  Laterality: N/A;    Family History  Problem Relation Age of Onset   COPD Mother        smoker   Diabetes Mother    COPD Father        smoker   Diabetes Father    Diabetes Sister    Diabetes Mellitus II Sister    Diabetes Sister    Diabetes Sister    Diabetes Sister    Lung cancer Nephew 60   Breast cancer Other 83   Colon cancer Neg Hx    Esophageal cancer Neg Hx    Pancreatic cancer Neg Hx    Rectal cancer Neg Hx    Stomach cancer Neg Hx     Allergies  Allergen Reactions   Benadryl [Diphenhydramine Hcl] Anaphylaxis   Ace Inhibitors Cough   Amoxicillin     Unknown reaction   Aspirin Nausea And Vomiting    Tolerates low dose aspirin    Codeine Nausea And Vomiting   Fish Allergy Nausea And Vomiting   Hydrocodone Nausea And Vomiting   Indomethacin     Unknown reaction    Pentazocine Lactate     passing out   Promethazine Hcl     Unknown reaction    Quinine     itching redface   Chlorphen-Phenyleph-Methscop Rash    Current Outpatient Medications on File Prior to Visit  Medication Sig Dispense Refill   acetaminophen (TYLENOL) 500 MG tablet Take 1,000 mg by mouth every 8 (eight) hours as needed for moderate pain.     ALPRAZolam (XANAX) 0.25 MG tablet Take 1 tablet (0.25 mg total) by mouth 2 (two) times daily as needed for anxiety. 60 tablet 0   amLODipine (NORVASC) 5 MG tablet TAKE 1 TABLET(5 MG) BY MOUTH DAILY 90 tablet 3   aspirin 81 MG tablet Take 81 mg by mouth at bedtime.     atorvastatin (LIPITOR) 10 MG tablet TAKE 1 TABLET(10 MG) BY MOUTH DAILY 90 tablet 3   betamethasone valerate ointment (VALISONE) 0.1 % Apply 1  Application topically 2 (two) times daily. Place in the affected area twice a bid for 2 weeks and then place on the area at night twice weekly. 45 g 0   blood glucose meter kit and supplies KIT Dispense based on patient and insurance preference. Use up to four times daily as directed. 1 each 0   Carboxymethylcellul-Glycerin (LUBRICATING EYE DROPS  OP) Place 1 drop into both eyes daily as needed (dry eyes).     cholecalciferol (VITAMIN D3) 25 MCG (1000 UT) tablet Take 1,000 Units by mouth daily.     citalopram (CELEXA) 20 MG tablet TAKE 1 TABLET(20 MG) BY MOUTH DAILY 90 tablet 1   Emollient (GOLD BOND DIABETICS DRY SKIN) CREA Apply 1 application. topically at bedtime.     furosemide (LASIX) 20 MG tablet Take one tab daily x 3 days as needed for swelling in legs 90 tablet 0   glucose blood test strip Use to check blood glucose TID 200 each 0   Lancets 28G MISC Use to check blood glucose 4 times daily 100 each 0   levothyroxine (SYNTHROID) 88 MCG tablet TAKE 1 TABLET(88 MCG) BY MOUTH DAILY 90 tablet 3   Lidocaine 4 % AERO Apply 1 spray topically at bedtime as needed (foot pain).     losartan-hydrochlorothiazide (HYZAAR) 100-25 MG tablet TAKE 1 TABLET BY MOUTH DAILY 90 tablet 3   metFORMIN (GLUCOPHAGE) 500 MG tablet TAKE 1 TABLET(500 MG) BY MOUTH TWICE DAILY WITH A MEAL 180 tablet 0   pramipexole (MIRAPEX) 1.5 MG tablet TAKE 1 TABLET(1.5 MG) BY MOUTH AT BEDTIME 90 tablet 1   Prenatal Vit-Fe Fumarate-FA (PRENATAL VITAMIN PO) Take 1 tablet by mouth daily.     vitamin B-12 (CYANOCOBALAMIN) 1000 MCG tablet Take 1,000 mcg by mouth daily.     nitroGLYCERIN (NITROSTAT) 0.4 MG SL tablet Place 1 tablet (0.4 mg total) under the tongue every 5 (five) minutes as needed for chest pain. 25 tablet 3   No current facility-administered medications on file prior to visit.    BP 120/70   Pulse 69   Temp 98 F (36.7 C) (Oral)   Ht 5' 3.5" (1.613 m)   Wt 178 lb (80.7 kg)   SpO2 95%   BMI 31.04 kg/m        Objective:   Physical Exam Vitals and nursing note reviewed.  Constitutional:      Appearance: Normal appearance.  Eyes:     Extraocular Movements:     Right eye: Nystagmus (horizontal nystagmus) present.     Left eye: Nystagmus (horizontal nystagmus) present.  Cardiovascular:     Rate and Rhythm: Normal rate and regular rhythm.     Pulses: Normal pulses.     Heart sounds: Normal heart sounds.  Pulmonary:     Effort: Pulmonary effort is normal.     Breath sounds: Normal breath sounds.  Skin:    General: Skin is warm and dry.  Neurological:     General: No focal deficit present.     Mental Status: She is alert and oriented to person, place, and time.     Cranial Nerves: Cranial nerves 2-12 are intact.     Sensory: Sensation is intact.     Motor: Motor function is intact.     Coordination: Coordination normal.     Gait: Gait is intact.     Comments: Became symptomatic with change of position on exam table    Psychiatric:        Mood and Affect: Mood normal.        Behavior: Behavior normal.        Thought Content: Thought content normal.        Judgment: Judgment normal.       Assessment & Plan:  1. Benign paroxysmal positional vertigo due to bilateral vestibular disorder - symptoms consistent with vertigo. Will prescribe Meclizine. This may  make her sleepy - Encouraged hydration  - Consider vestibular rehab if no improvement over the week  - meclizine (ANTIVERT) 25 MG tablet; Take 1 tablet (25 mg total) by mouth 3 (three) times daily as needed for dizziness.  Dispense: 30 tablet; Refill: 0  2. Nausea  - ondansetron (ZOFRAN) 4 MG tablet; Take 1 tablet (4 mg total) by mouth every 8 (eight) hours as needed for nausea or vomiting.  Dispense: 20 tablet; Refill: 0  Shirline Frees, NP  Time spent with patient today was 31 minutes which consisted of chart review, discussing vertig, work up, treatment answering questions and documentation.

## 2022-09-26 ENCOUNTER — Encounter: Payer: Self-pay | Admitting: Internal Medicine

## 2022-10-02 ENCOUNTER — Other Ambulatory Visit: Payer: Self-pay

## 2022-10-02 ENCOUNTER — Encounter: Payer: Self-pay | Admitting: Adult Health

## 2022-10-02 ENCOUNTER — Other Ambulatory Visit: Payer: Self-pay | Admitting: Adult Health

## 2022-10-02 DIAGNOSIS — E118 Type 2 diabetes mellitus with unspecified complications: Secondary | ICD-10-CM

## 2022-10-02 DIAGNOSIS — H8113 Benign paroxysmal vertigo, bilateral: Secondary | ICD-10-CM

## 2022-10-02 DIAGNOSIS — Z760 Encounter for issue of repeat prescription: Secondary | ICD-10-CM

## 2022-10-02 MED ORDER — METFORMIN HCL 500 MG PO TABS
ORAL_TABLET | ORAL | 0 refills | Status: DC
Start: 2022-10-02 — End: 2022-11-22

## 2022-10-19 ENCOUNTER — Other Ambulatory Visit: Payer: Self-pay | Admitting: Adult Health

## 2022-10-19 DIAGNOSIS — Z760 Encounter for issue of repeat prescription: Secondary | ICD-10-CM

## 2022-10-25 ENCOUNTER — Ambulatory Visit: Payer: Medicare Other | Attending: Adult Health

## 2022-10-25 ENCOUNTER — Other Ambulatory Visit: Payer: Self-pay

## 2022-10-25 DIAGNOSIS — R42 Dizziness and giddiness: Secondary | ICD-10-CM | POA: Insufficient documentation

## 2022-10-25 DIAGNOSIS — H8113 Benign paroxysmal vertigo, bilateral: Secondary | ICD-10-CM | POA: Diagnosis not present

## 2022-10-25 DIAGNOSIS — R2681 Unsteadiness on feet: Secondary | ICD-10-CM | POA: Insufficient documentation

## 2022-10-25 NOTE — Therapy (Signed)
OUTPATIENT PHYSICAL THERAPY VESTIBULAR EVALUATION     Patient Name: [name] MRN: [id number] DOB:01-05-1951, 72 y.o., female Today's Date: 10/25/2022  END OF SESSION:  PT End of Session - 10/25/22 0757     Visit Number 1    Number of Visits 8    Date for PT Re-Evaluation 11/22/22    Authorization Type Medicare/MCD    PT Start Time 0800    PT Stop Time 0845    PT Time Calculation (min) 45 min             Past Medical History:  Diagnosis Date   Allergy    Anemia    Anxiety    ANXIETY DEPRESSION 09/15/2007   Arthritis    ASTHMA 05/15/2009   Asthma    DIABETES MELLITUS, TYPE II 12/11/2006   DIVERTICULOSIS, COLON 12/11/2006   Edema 01/15/2009   Gout    pt denies; toe was broken   Headache(784.0) 12/11/2006   Hyperlipidemia    Hypertension    HYPERTENSION NEC 08/14/2007   HYPOTHYROIDISM 12/11/2006   PELVIC PAIN, CHRONIC 08/14/2007   Restless leg syndrome    Past Surgical History:  Procedure Laterality Date   ABDOMINAL HYSTERECTOMY     APPENDECTOMY     Bladder Suspension     COLONOSCOPY  10/08/2017   Dr. Marina Goodell   LEFT HEART CATH AND CORONARY ANGIOGRAPHY N/A 09/05/2021   Procedure: LEFT HEART CATH AND CORONARY ANGIOGRAPHY;  Surgeon: Lennette Bihari, MD;  Location: [facility];  Service: Cardiovascular;  Laterality: N/A;   Patient Active Problem List   Diagnosis Date Noted   Chest pain of uncertain etiology    Fracture of second toe, left, closed, initial encounter 09/26/2017   GERD (gastroesophageal reflux disease) 10/19/2014   Essential hypertension 08/16/2014   Breast pain, right 05/20/2014   Restless leg syndrome 05/20/2014   Hip pain, bilateral 10/23/2011   Knee pain, bilateral 10/23/2011   EDEMA 01/15/2009   Hypothyroidism 12/11/2006   Diabetes type 2, uncontrolled 12/11/2006   DIVERTICULOSIS, COLON 12/11/2006   HEADACHE 12/11/2006    PCP: Shirline Frees, NP REFERRING PROVIDER: Shirline Frees, NP  REFERRING DIAG:   H81.13 (ICD-10-CM) - Benign paroxysmal positional vertigo due to bilateral vestibular disorder    THERAPY DIAG:  Dizziness and giddiness  Unsteadiness on feet  ONSET DATE: May 2024  Rationale for Evaluation and Treatment: Rehabilitation  SUBJECTIVE:   SUBJECTIVE STATEMENT: Having vertigo since May, went to beach and reports heat exhaustion and since then she has been having symptoms.  Notes that looking up/down triggers symptoms even in sitting.  Notes positional dizziness as well with eye/head positions.  Reports continuous symptoms. Denies photo/phonophobia, no tinnitus or hearing loss.  Pt reports hx of vertigo in the past which spontaneously resolved.  Pt accompanied by: self  PERTINENT HISTORY: past medical history of Allergy, Anemia, Anxiety, ANXIETY DEPRESSION (09/15/2007), Arthritis, ASTHMA (05/15/2009), Asthma, DIABETES MELLITUS, TYPE II (12/11/2006), DIVERTICULOSIS, COLON (12/11/2006), Edema (01/15/2009), Gout, Headache(784.0) (12/11/2006), Hyperlipidemia, Hypertension, HYPERTENSION NEC (08/14/2007), HYPOTHYROIDISM (12/11/2006), PELVIC PAIN, CHRONIC (08/14/2007), and Restless leg syndrome.  PAIN:  Are you having pain?  Reports chronic LBP but no worse  PRECAUTIONS: None  RED FLAGS: None   WEIGHT BEARING RESTRICTIONS: No  FALLS: Has patient fallen in last 6 months? Yes. Number of falls 2 falling forward since onset of dizziness  LIVING ENVIRONMENT: Lives with: lives alone Lives in: House/apartment Stairs: No Has following equipment at home:  has cane  PLOF: Independent  PATIENT GOALS: Get  rid of dizziness  OBJECTIVE:   DIAGNOSTIC FINDINGS: n/a for this episode  COGNITION: Overall cognitive status: Within functional limits for tasks assessed   SENSATION: NT, pt reports diabetic neuropathy affecting dorsal feet  EDEMA:  BLE lower leg >    POSTURE:  No Significant postural limitations  Cervical ROM:    Extension/flexion guarded due to  symptoms  STRENGTH: NT  LOWER EXTREMITY MMT:    BED MOBILITY:  indep  TRANSFERS: indep   GAIT: Gait pattern: WFL Distance walked:  Assistive device utilized: None Level of assistance: Complete Independence Comments:     VESTIBULAR ASSESSMENT:  GENERAL OBSERVATION: wears bifocals   SYMPTOM BEHAVIOR:  Subjective history: sudden onset dizziness in May, present ever since  Non-Vestibular symptoms:  none of note  Type of dizziness: Imbalance (Disequilibrium) and Spinning/Vertigo  Frequency: everyday  Duration: much of the day and increased with certain positions  Aggravating factors: Induced by position change: supine to sit and sit to stand, Induced by motion: looking up at the ceiling, bending down to the ground, and turning head quickly, Worse outside or in busy environment, and Moving eyes  Relieving factors: rest, slow movements, and avoid busy/distracting environments  Progression of symptoms: unchanged  OCULOMOTOR EXAM:  Ocular Alignment: normal  Ocular ROM: No Limitations  Spontaneous Nystagmus: absent  Gaze-Induced Nystagmus: absent  Smooth Pursuits: intact, symptomtaic  Saccades: intact, reports 3/10 symptoms horizontal, 5/10 vertical  Convergence/Divergence: 6 cm   FRENZEL - FIXATION SUPRESSED:   VESTIBULAR - OCULAR REFLEX:   Slow VOR: Comment: slower speed, reports blurriness  VOR Cancellation: Normal  Head-Impulse Test: HIT Right: negative HIT Left: negative  Dynamic Visual Acuity: Not able to be assessed TBD   POSITIONAL TESTING: Right Dix-Hallpike: no nystagmus Left Dix-Hallpike: upbeating, left nystagmus Right Roll Test: no nystagmus Left Roll Test: no nystagmus  MOTION SENSITIVITY:  Motion Sensitivity Quotient Intensity: 0 = none, 1 = Lightheaded, 2 = Mild, 3 = Moderate, 4 = Severe, 5 = Vomiting  Intensity  1. Sitting to supine   2. Supine to L side   3. Supine to R side   4. Supine to sitting   5. L Hallpike-Dix   6. Up from L    7.  R Hallpike-Dix   8. Up from R    9. Sitting, head tipped to L knee   10. Head up from L knee   11. Sitting, head tipped to R knee   12. Head up from R knee   13. Sitting head turns x5   14.Sitting head nods x5   15. In stance, 180 turn to L    16. In stance, 180 turn to R     OTHOSTATICS: not done  FUNCTIONAL GAIT: Functional gait assessment: TBD  M-CTSIB  Condition 1: Firm Surface, EO 30 Sec, Normal and Mild Sway  Condition 2: Firm Surface, EC 30 Sec, Mild Sway  Condition 3: Foam Surface, EO 30 Sec, Moderate Sway  Condition 4: Foam Surface, EC 9 Sec, Moderate and Severe Sway     VESTIBULAR TREATMENT:  DATE: 10/25/22  Canalith Repositioning:  Epley Left: Number of Reps: 1 no nystagmus on repeat Dix-Hallpike  Gaze Adaptation:  x1 Viewing Horizontal: Position: sitting x30 sec Habituation:  TBD Other:   PATIENT EDUCATION: Education details: assessment details, PT intervention, and HEP initiation Person educated: Patient Education method: Explanation Education comprehension: verbalized understanding  HOME EXERCISE PROGRAM: Access Code: [email or web address] URL: [email or web address] Date: 10/25/2022 Prepared by: Shary Decamp  Exercises - Self-Epley Maneuver Left Ear  - 1 x daily - 7 x weekly - Seated Gaze Stabilization with Head Rotation  - 1 x daily - 7 x weekly - 3 sets - 3-5 reps - build to 30 sec hold  GOALS: Goals reviewed with patient? Yes  SHORT TERM GOALS: Target date: same as LTG   LONG TERM GOALS: Target date: 11/22/2022    Pt to report absence of positional dizziness  Baseline: +left Dix-Hallpike Goal status: INITIAL  2.  Pt to report absence of symptoms with standing VOR x 1 x 30 sec Baseline: 5 reps Goal status: INITIAL  3.  Demo improved postural stability per mild sway x30 sec condition 4 M-CTSIB to improve postural stability and safety with  ADL Baseline:  Goal status: INITIAL    ASSESSMENT:  CLINICAL IMPRESSION: Patient is a 72 y.o. lady who was seen today for physical therapy evaluation and treatment for BPPV.  Demonstrates left upbeating nystagmus in Dix-Hallpike position and exhibits postural instability per condition 4 M-CTSIB and deficits with VOR tolerance with provocation of symptoms. Able to tolerate Epley maneuver on this date with resolution of vertigo and left nystagmus.  Initiated HEP to address remaining deficits and would benefit from further PT sessions for assessment and intervention as indicated  OBJECTIVE IMPAIRMENTS: decreased activity tolerance, decreased balance, and dizziness.   ACTIVITY LIMITATIONS: bending, sleeping, reach over head, and locomotion level  PARTICIPATION LIMITATIONS: meal prep, cleaning, laundry, and community activity  PERSONAL FACTORS: Time since onset of injury/illness/exacerbation and 1-2 comorbidities: PMH  are also affecting patient's functional outcome.   REHAB POTENTIAL: Good  CLINICAL DECISION MAKING: Evolving/moderate complexity  EVALUATION COMPLEXITY: Moderate   PLAN:  PT FREQUENCY: 1-2x/week  PT DURATION: 4 weeks  PLANNED INTERVENTIONS: Therapeutic exercises, Therapeutic activity, Neuromuscular re-education, Balance training, Gait training, Patient/Family education, Self Care, Joint mobilization, Stair training, Vestibular training, Canalith repositioning, DME instructions, Aquatic Therapy, Dry Needling, Spinal mobilization, Cryotherapy, Moist heat, Taping, Ionotophoresis 4mg /ml Dexamethasone, and Manual therapy  PLAN FOR NEXT SESSION: re-assess positional dizziness, HEP review, progress VOR as indicated   1:04 PM, 10/25/22 M. Shary Decamp, PT, DPT Physical Therapist-  Office Number: [phone]

## 2022-10-29 ENCOUNTER — Ambulatory Visit: Payer: Medicare Other

## 2022-10-29 DIAGNOSIS — R2681 Unsteadiness on feet: Secondary | ICD-10-CM

## 2022-10-29 DIAGNOSIS — H8113 Benign paroxysmal vertigo, bilateral: Secondary | ICD-10-CM | POA: Diagnosis not present

## 2022-10-29 DIAGNOSIS — R42 Dizziness and giddiness: Secondary | ICD-10-CM | POA: Diagnosis not present

## 2022-10-29 NOTE — Therapy (Signed)
OUTPATIENT PHYSICAL THERAPY VESTIBULAR TREATMENT     Patient Name: [name] MRN: [id number] DOB:[birth year], 72 y.o., female Today's Date: 10/29/2022  END OF SESSION:  PT End of Session - 10/29/22 0853     Visit Number 2    Number of Visits 8    Date for PT Re-Evaluation 11/22/22    Authorization Type Medicare/MCD    PT Start Time 0848    PT Stop Time 0930    PT Time Calculation (min) 42 min             Past Medical History:  Diagnosis Date   Allergy    Anemia    Anxiety    ANXIETY DEPRESSION 09/15/2007   Arthritis    ASTHMA 05/15/2009   Asthma    DIABETES MELLITUS, TYPE II 12/11/2006   DIVERTICULOSIS, COLON 12/11/2006   Edema 01/15/2009   Gout    pt denies; toe was broken   Headache(784.0) 12/11/2006   Hyperlipidemia    Hypertension    HYPERTENSION NEC 08/14/2007   HYPOTHYROIDISM 12/11/2006   PELVIC PAIN, CHRONIC 08/14/2007   Restless leg syndrome    Past Surgical History:  Procedure Laterality Date   ABDOMINAL HYSTERECTOMY     APPENDECTOMY     Bladder Suspension     COLONOSCOPY  10/08/2017   Dr. Marina Goodell   LEFT HEART CATH AND CORONARY ANGIOGRAPHY N/A 09/05/2021   Procedure: LEFT HEART CATH AND CORONARY ANGIOGRAPHY;  Surgeon: Lennette Bihari, MD;  Location: [facility];  Service: Cardiovascular;  Laterality: N/A;   Patient Active Problem List   Diagnosis Date Noted   Chest pain of uncertain etiology    Fracture of second toe, left, closed, initial encounter 09/26/2017   GERD (gastroesophageal reflux disease) 10/19/2014   Essential hypertension 08/16/2014   Breast pain, right 05/20/2014   Restless leg syndrome 05/20/2014   Hip pain, bilateral 10/23/2011   Knee pain, bilateral 10/23/2011   EDEMA 01/15/2009   Hypothyroidism 12/11/2006   Diabetes type 2, uncontrolled 12/11/2006   DIVERTICULOSIS, COLON 12/11/2006   HEADACHE 12/11/2006    PCP: Shirline Frees, NP REFERRING PROVIDER: Shirline Frees, NP  REFERRING DIAG:  H81.13  (ICD-10-CM) - Benign paroxysmal positional vertigo due to bilateral vestibular disorder    THERAPY DIAG:  Dizziness and giddiness  Unsteadiness on feet  ONSET DATE: May 2024  Rationale for Evaluation and Treatment: Rehabilitation  SUBJECTIVE:   SUBJECTIVE STATEMENT: Bad weekend. Tried the repositioning and have felt worse since.  Like someone pushed me when I went to get up. Feeling nauseous.   Pt accompanied by: self  PERTINENT HISTORY: past medical history of Allergy, Anemia, Anxiety, ANXIETY DEPRESSION (09/15/2007), Arthritis, ASTHMA (05/15/2009), Asthma, DIABETES MELLITUS, TYPE II (12/11/2006), DIVERTICULOSIS, COLON (12/11/2006), Edema (01/15/2009), Gout, Headache(784.0) (12/11/2006), Hyperlipidemia, Hypertension, HYPERTENSION NEC (08/14/2007), HYPOTHYROIDISM (12/11/2006), PELVIC PAIN, CHRONIC (08/14/2007), and Restless leg syndrome.  PAIN:  Are you having pain?  Reports chronic LBP but no worse  PRECAUTIONS: None  RED FLAGS: None   WEIGHT BEARING RESTRICTIONS: No  FALLS: Has patient fallen in last 6 months? Yes. Number of falls 2 falling forward since onset of dizziness  LIVING ENVIRONMENT: Lives with: lives alone Lives in: House/apartment Stairs: No Has following equipment at home:  has cane  PLOF: Independent  PATIENT GOALS: Get rid of dizziness  OBJECTIVE:   TODAY'S TREATMENT: 10/29/22 Activity Comments  Left Dix-Hallpike Minimal symptoms, no obvious nystagmus  Right Dix-Hallpike Right upbeating x 20+ sec  Right Epley When head turned to position 2,  immediate onset of left geotropic nystagmus x 20+ sec. Nystagmus in position 3   Left roll test to BBQ roll -once positioned to right sidelying demo apogeotropic  Left roll test -apogeotropic (right beating to ceiling)--became very nauseated        DIAGNOSTIC FINDINGS: n/a for this episode  COGNITION: Overall cognitive status: Within functional limits for tasks assessed   SENSATION: NT, pt reports  diabetic neuropathy affecting dorsal feet  EDEMA:  BLE lower leg >    POSTURE:  No Significant postural limitations  Cervical ROM:    Extension/flexion guarded due to symptoms  STRENGTH: NT  LOWER EXTREMITY MMT:    BED MOBILITY:  indep  TRANSFERS: indep   GAIT: Gait pattern: WFL Distance walked:  Assistive device utilized: None Level of assistance: Complete Independence Comments:     VESTIBULAR ASSESSMENT:  GENERAL OBSERVATION: wears bifocals   SYMPTOM BEHAVIOR:  Subjective history: sudden onset dizziness in May, present ever since  Non-Vestibular symptoms:  none of note  Type of dizziness: Imbalance (Disequilibrium) and Spinning/Vertigo  Frequency: everyday  Duration: much of the day and increased with certain positions  Aggravating factors: Induced by position change: supine to sit and sit to stand, Induced by motion: looking up at the ceiling, bending down to the ground, and turning head quickly, Worse outside or in busy environment, and Moving eyes  Relieving factors: rest, slow movements, and avoid busy/distracting environments  Progression of symptoms: unchanged  OCULOMOTOR EXAM:  Ocular Alignment: normal  Ocular ROM: No Limitations  Spontaneous Nystagmus: absent  Gaze-Induced Nystagmus: absent  Smooth Pursuits: intact, symptomtaic  Saccades: intact, reports 3/10 symptoms horizontal, 5/10 vertical  Convergence/Divergence: 6 cm   FRENZEL - FIXATION SUPRESSED:   VESTIBULAR - OCULAR REFLEX:   Slow VOR: Comment: slower speed, reports blurriness  VOR Cancellation: Normal  Head-Impulse Test: HIT Right: negative HIT Left: negative  Dynamic Visual Acuity: Not able to be assessed TBD   POSITIONAL TESTING: Right Dix-Hallpike: no nystagmus Left Dix-Hallpike: upbeating, left nystagmus Right Roll Test: no nystagmus Left Roll Test: no nystagmus  MOTION SENSITIVITY:  Motion Sensitivity Quotient Intensity: 0 = none, 1 = Lightheaded, 2 = Mild, 3 =  Moderate, 4 = Severe, 5 = Vomiting  Intensity  1. Sitting to supine   2. Supine to L side   3. Supine to R side   4. Supine to sitting   5. L Hallpike-Dix   6. Up from L    7. R Hallpike-Dix   8. Up from R    9. Sitting, head tipped to L knee   10. Head up from L knee   11. Sitting, head tipped to R knee   12. Head up from R knee   13. Sitting head turns x5   14.Sitting head nods x5   15. In stance, 180 turn to L    16. In stance, 180 turn to R     OTHOSTATICS: not done  FUNCTIONAL GAIT: Functional gait assessment: TBD  M-CTSIB  Condition 1: Firm Surface, EO 30 Sec, Normal and Mild Sway  Condition 2: Firm Surface, EC 30 Sec, Mild Sway  Condition 3: Foam Surface, EO 30 Sec, Moderate Sway  Condition 4: Foam Surface, EC 9 Sec, Moderate and Severe Sway     VESTIBULAR TREATMENT:  DATE: 10/25/22  Canalith Repositioning:  Epley Left: Number of Reps: 1 no nystagmus on repeat Dix-Hallpike  Gaze Adaptation:  x1 Viewing Horizontal: Position: sitting x30 sec Habituation:  TBD Other:   PATIENT EDUCATION: Education details: assessment details, PT intervention, and HEP initiation Person educated: Patient Education method: Explanation Education comprehension: verbalized understanding  HOME EXERCISE PROGRAM: Access Code: [email or web address] URL: [email or web address] Date: 10/25/2022 Prepared by: Shary Decamp  Exercises - Self-Epley Maneuver Left Ear  - 1 x daily - 7 x weekly - Seated Gaze Stabilization with Head Rotation  - 1 x daily - 7 x weekly - 3 sets - 3-5 reps - build to 30 sec hold  GOALS: Goals reviewed with patient? Yes  SHORT TERM GOALS: Target date: same as LTG   LONG TERM GOALS: Target date: 11/22/2022    Pt to report absence of positional dizziness  Baseline: +left Dix-Hallpike Goal status: INITIAL  2.  Pt to report absence of symptoms with standing VOR  x 1 x 30 sec Baseline: 5 reps Goal status: INITIAL  3.  Demo improved postural stability per mild sway x30 sec condition 4 M-CTSIB to improve postural stability and safety with ADL Baseline:  Goal status: INITIAL    ASSESSMENT:  CLINICAL IMPRESSION: Returns today with amplified symptoms and unfortunately seems to have had a conversion from posterior to horizontal canal.  Initially demonstrated +Right Dix-Hallpike with right, upbeating nystagmus, during right epley once head was positioned to left rotation experienced geotropic nystagmus. Initiated left BBQ roll and therein, experienced a right apogeotropic nystagmus and worsening of symptoms.  I have asked that she try to come to clinic again later this week to re-assess and treat accordingly.  OBJECTIVE IMPAIRMENTS: decreased activity tolerance, decreased balance, and dizziness.   ACTIVITY LIMITATIONS: bending, sleeping, reach over head, and locomotion level  PARTICIPATION LIMITATIONS: meal prep, cleaning, laundry, and community activity  PERSONAL FACTORS: Time since onset of injury/illness/exacerbation and 1-2 comorbidities: PMH  are also affecting patient's functional outcome.   REHAB POTENTIAL: Good  CLINICAL DECISION MAKING: Evolving/moderate complexity  EVALUATION COMPLEXITY: Moderate   PLAN:  PT FREQUENCY: 1-2x/week  PT DURATION: 4 weeks  PLANNED INTERVENTIONS: Therapeutic exercises, Therapeutic activity, Neuromuscular re-education, Balance training, Gait training, Patient/Family education, Self Care, Joint mobilization, Stair training, Vestibular training, Canalith repositioning, DME instructions, Aquatic Therapy, Dry Needling, Spinal mobilization, Cryotherapy, Moist heat, Taping, Ionotophoresis 4mg /ml Dexamethasone, and Manual therapy  PLAN FOR NEXT SESSION: re-assess positional dizziness, Right Kim maneuver  8:53 AM, 10/29/22 M. Shary Decamp, PT, DPT Physical Therapist- [place]

## 2022-10-30 ENCOUNTER — Ambulatory Visit: Payer: Medicare Other | Admitting: Rehabilitative and Restorative Service Providers"

## 2022-10-30 ENCOUNTER — Encounter: Payer: Self-pay | Admitting: Rehabilitative and Restorative Service Providers"

## 2022-10-30 DIAGNOSIS — R2681 Unsteadiness on feet: Secondary | ICD-10-CM

## 2022-10-30 DIAGNOSIS — H8113 Benign paroxysmal vertigo, bilateral: Secondary | ICD-10-CM | POA: Diagnosis not present

## 2022-10-30 DIAGNOSIS — R42 Dizziness and giddiness: Secondary | ICD-10-CM

## 2022-10-30 NOTE — Therapy (Signed)
OUTPATIENT PHYSICAL THERAPY VESTIBULAR TREATMENT   Patient Name: [name] MRN: [id number] DOB:1951-02-14, 72 y.o., female Today's Date: 10/30/2022  END OF SESSION:  PT End of Session - 10/30/22 1318     Visit Number 3    Number of Visits 8    Date for PT Re-Evaluation 11/22/22    Authorization Type Medicare/MCD    PT Start Time 1320    PT Stop Time 1400    PT Time Calculation (min) 40 min    Activity Tolerance Patient tolerated treatment well    Behavior During Therapy [facility] for tasks assessed/performed            Past Medical History:  Diagnosis Date   Allergy    Anemia    Anxiety    ANXIETY DEPRESSION 09/15/2007   Arthritis    ASTHMA 05/15/2009   Asthma    DIABETES MELLITUS, TYPE II 12/11/2006   DIVERTICULOSIS, COLON 12/11/2006   Edema 01/15/2009   Gout    pt denies; toe was broken   Headache(784.0) 12/11/2006   Hyperlipidemia    Hypertension    HYPERTENSION NEC 08/14/2007   HYPOTHYROIDISM 12/11/2006   PELVIC PAIN, CHRONIC 08/14/2007   Restless leg syndrome    Past Surgical History:  Procedure Laterality Date   ABDOMINAL HYSTERECTOMY     APPENDECTOMY     Bladder Suspension     COLONOSCOPY  10/08/2017   Dr. Marina Goodell   LEFT HEART CATH AND CORONARY ANGIOGRAPHY N/A 09/05/2021   Procedure: LEFT HEART CATH AND CORONARY ANGIOGRAPHY;  Surgeon: Lennette Bihari, MD;  Location: [facility];  Service: Cardiovascular;  Laterality: N/A;   Patient Active Problem List   Diagnosis Date Noted   Chest pain of uncertain etiology    Fracture of second toe, left, closed, initial encounter 09/26/2017   GERD (gastroesophageal reflux disease) 10/19/2014   Essential hypertension 08/16/2014   Breast pain, right 05/20/2014   Restless leg syndrome 05/20/2014   Hip pain, bilateral 10/23/2011   Knee pain, bilateral 10/23/2011   EDEMA 01/15/2009   Hypothyroidism 12/11/2006   Diabetes type 2, uncontrolled 12/11/2006   DIVERTICULOSIS, COLON 12/11/2006    HEADACHE 12/11/2006    PCP: Shirline Frees, NP REFERRING PROVIDER: Shirline Frees, NP  REFERRING DIAG:  H81.13 (ICD-10-CM) - Benign paroxysmal positional vertigo due to bilateral vestibular disorder    THERAPY DIAG:  Dizziness and giddiness  Unsteadiness on feet  ONSET DATE: May 2024  Rationale for Evaluation and Treatment: Rehabilitation  SUBJECTIVE:   SUBJECTIVE STATEMENT: The patient reports that today is the worst her symptoms have been.    Pt accompanied by: self  PERTINENT HISTORY: past medical history of Allergy, Anemia, Anxiety, ANXIETY DEPRESSION (09/15/2007), Arthritis, ASTHMA (05/15/2009), Asthma, DIABETES MELLITUS, TYPE II (12/11/2006), DIVERTICULOSIS, COLON (12/11/2006), Edema (01/15/2009), Gout, Headache(784.0) (12/11/2006), Hyperlipidemia, Hypertension, HYPERTENSION NEC (08/14/2007), HYPOTHYROIDISM (12/11/2006), PELVIC PAIN, CHRONIC (08/14/2007), and Restless leg syndrome.  PAIN:  Are you having pain?  Reports chronic LBP but no worse  PRECAUTIONS: None  WEIGHT BEARING RESTRICTIONS: No  FALLS: Has patient fallen in last 6 months? Yes. Number of falls 2 falling forward since onset of dizziness  LIVING ENVIRONMENT: Lives with: lives alone Lives in: House/apartment Stairs: No Has following equipment at home:  has cane  PLOF: Independent  PATIENT GOALS: Get rid of dizziness  OBJECTIVE:   [facility] Adult PT Treatment:  DATE: 10/30/22 Neuromuscular re-ed: Positional testing Sit>supine on 2 pillows- no nystagmus or dizziness (moves guarded) Rolling R= significant apogeotropic nystagmus that was persistent (waited 30 sec then moved to supine) Rolling L=mild nystagmus  Retested after canolith repositioning and patient had no nystagmus viewed in room light Canolith repositioining: Lorretta Harp (2011) for L horizontal cupulolithiasis with vibration 1st and 4th positions Rested at 3rd position due to nausea and  dizziness Symptoms converted to geotropic with R roll during maneuver   TODAY'S TREATMENT: 10/29/22 Activity Comments  Left Dix-Hallpike Minimal symptoms, no obvious nystagmus  Right Dix-Hallpike Right upbeating x 20+ sec  Right Epley When head turned to position 2, immediate onset of left geotropic nystagmus x 20+ sec. Nystagmus in position 3   Left roll test to BBQ roll -once positioned to right sidelying demo apogeotropic  Left roll test -apogeotropic (right beating to ceiling)--became very nauseated       PATIENT EDUCATION: Education details: assessment details, PT intervention, and HEP initiation Person educated: Patient Education method: Explanation Education comprehension: verbalized understanding  HOME EXERCISE PROGRAM: Access Code: [email or web address] URL: [email or web address] Date: 10/25/2022 Prepared by: Shary Decamp  Exercises - Self-Epley Maneuver Left Ear  - 1 x daily - 7 x weekly - Seated Gaze Stabilization with Head Rotation  - 1 x daily - 7 x weekly - 3 sets - 3-5 reps - build to 30 sec hold  ____________________________________________________________________________________________________________________ (Measures in this section from initial evaluation unless otherwise noted) SENSATION: NT, pt reports diabetic neuropathy affecting dorsal feet  EDEMA:  BLE lower leg >  POSTURE:  No Significant postural limitations  Cervical ROM:   Extension/flexion guarded due to symptoms  GAIT: Gait pattern: WFL Distance walked:  Assistive device utilized: None Level of assistance: Complete Independence  VESTIBULAR ASSESSMENT:  GENERAL OBSERVATION: wears bifocals   SYMPTOM BEHAVIOR:  Subjective history: sudden onset dizziness in May, present ever since  Non-Vestibular symptoms:  none of note  Type of dizziness: Imbalance (Disequilibrium) and Spinning/Vertigo  Frequency: everyday  Duration: much of the day and increased with certain  positions  Aggravating factors: Induced by position change: supine to sit and sit to stand, Induced by motion: looking up at the ceiling, bending down to the ground, and turning head quickly, Worse outside or in busy environment, and Moving eyes  Relieving factors: rest, slow movements, and avoid busy/distracting environments  Progression of symptoms: unchanged  OCULOMOTOR EXAM:  Ocular Alignment: normal  Ocular ROM: No Limitations  Spontaneous Nystagmus: absent  Gaze-Induced Nystagmus: absent  Smooth Pursuits: intact, symptomtaic  Saccades: intact, reports 3/10 symptoms horizontal, 5/10 vertical  Convergence/Divergence: 6 cm   VESTIBULAR - OCULAR REFLEX:   Slow VOR: Comment: slower speed, reports blurriness  VOR Cancellation: Normal  Head-Impulse Test: HIT Right: negative HIT Left: negative  Dynamic Visual Acuity: Not able to be assessed TBD   POSITIONAL TESTING: Right Dix-Hallpike: no nystagmus Left Dix-Hallpike: upbeating, left nystagmus Right Roll Test: no nystagmus Left Roll Test: no nystagmus  FUNCTIONAL GAIT: Functional gait assessment: TBD  M-CTSIB  Condition 1: Firm Surface, EO 30 Sec, Normal and Mild Sway  Condition 2: Firm Surface, EC 30 Sec, Mild Sway  Condition 3: Foam Surface, EO 30 Sec, Moderate Sway  Condition 4: Foam Surface, EC 9 Sec, Moderate and Severe Sway   GOALS: Goals reviewed with patient? Yes  SHORT TERM GOALS: Target date: same as LTG  LONG TERM GOALS: Target date: 11/22/2022   Pt to report absence of positional dizziness  Baseline: +left Dix-Hallpike Goal status:  INITIAL  2.  Pt to report absence of symptoms with standing VOR x 1 x 30 sec Baseline: 5 reps Goal status: INITIAL  3.  Demo improved postural stability per mild sway x30 sec condition 4 M-CTSIB to improve postural stability and safety with ADL Baseline:  Goal status: INITIAL  ASSESSMENT:  CLINICAL IMPRESSION: The patient is continuing with worsening symptoms today with +  testing for L cupulolithiasis (symptoms worse with R horizontal roll). PT treated with Kim maneuver and with re-test, no nystagmus viewed. 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   OBJECTIVE IMPAIRMENTS: decreased activity tolerance, decreased balance, and dizziness.   PLAN:  PT FREQUENCY: 1-2x/week  PT DURATION: 4 weeks  PLANNED INTERVENTIONS: Therapeutic exercises, Therapeutic activity, Neuromuscular re-education, Balance training, Gait training, Patient/Family education, Self Care, Joint mobilization, Stair training, Vestibular training, Canalith repositioning, DME instructions, Aquatic Therapy, Dry Needling, Spinal mobilization, Cryotherapy, Moist heat, Taping, Ionotophoresis 4mg /ml Dexamethasone, and Manual therapy  PLAN FOR NEXT SESSION: re-assess positional dizziness, Right Kim maneuver   Keynan Heffern, PT 10/30/2022  .m

## 2022-11-02 ENCOUNTER — Encounter: Payer: Self-pay | Admitting: Family Medicine

## 2022-11-02 ENCOUNTER — Ambulatory Visit (INDEPENDENT_AMBULATORY_CARE_PROVIDER_SITE_OTHER): Payer: Medicare Other | Admitting: Family Medicine

## 2022-11-02 VITALS — BP 126/70 | HR 76 | Temp 98.7°F | Resp 16 | Ht 63.5 in | Wt 181.2 lb

## 2022-11-02 DIAGNOSIS — R062 Wheezing: Secondary | ICD-10-CM

## 2022-11-02 DIAGNOSIS — J029 Acute pharyngitis, unspecified: Secondary | ICD-10-CM

## 2022-11-02 DIAGNOSIS — J988 Other specified respiratory disorders: Secondary | ICD-10-CM

## 2022-11-02 DIAGNOSIS — R059 Cough, unspecified: Secondary | ICD-10-CM | POA: Diagnosis not present

## 2022-11-02 LAB — POC COVID19 BINAXNOW: SARS Coronavirus 2 Ag: NEGATIVE

## 2022-11-02 LAB — POCT RAPID STREP A (OFFICE): Rapid Strep A Screen: NEGATIVE

## 2022-11-02 MED ORDER — BENZONATATE 100 MG PO CAPS
200.0000 mg | ORAL_CAPSULE | Freq: Two times a day (BID) | ORAL | 0 refills | Status: AC | PRN
Start: 2022-11-02 — End: 2022-11-12

## 2022-11-02 MED ORDER — ALBUTEROL SULFATE HFA 108 (90 BASE) MCG/ACT IN AERS
2.0000 | INHALATION_SPRAY | Freq: Four times a day (QID) | RESPIRATORY_TRACT | 0 refills | Status: DC | PRN
Start: 2022-11-02 — End: 2023-09-20

## 2022-11-02 MED ORDER — DOXYCYCLINE HYCLATE 100 MG PO TABS
100.0000 mg | ORAL_TABLET | Freq: Two times a day (BID) | ORAL | 0 refills | Status: AC
Start: 2022-11-02 — End: 2022-11-09

## 2022-11-02 NOTE — Patient Instructions (Addendum)
A few things to remember from today's visit:  Sore throat - Plan: POC Rapid Strep A  Cough, unspecified type - Plan: POC COVID-19, benzonatate (TESSALON) 100 MG capsule  Respiratory tract infection - Plan: doxycycline (VIBRA-TABS) 100 MG tablet  Wheezing - Plan: albuterol (VENTOLIN HFA) 108 (90 Base) MCG/ACT inhaler  I do not think you need antibiotic at this time but sent prescription in case symptoms get worse. Because [employer] is down, we could not do chest X ray today. Plain mucinex and rest recommended. Albuterol inh 2 puff every 6 hours for a week then as needed for wheezing or shortness of breath.   Please arrange follow up appt with ypour PCP if cough is not any better in 2-3 weeks, before of worse.  If you need refills for medications you take chronically, please call your pharmacy. Do not use My Chart to request refills or for acute issues that need immediate attention. If you send a my chart message, it may take a few days to be addressed, specially if I am not in the office.  Please be sure medication list is accurate. If a new problem present, please set up appointment sooner than planned today.

## 2022-11-02 NOTE — Progress Notes (Unsigned)
ACUTE VISIT Chief Complaint  Patient presents with   Cough    Productive, blood-tinged, no fever.    Nasal Congestion   HPI: Ms.Teneka Malyia Moro is a 72 y.o. female with PMHx significant for HTN,DM II,HLD, and GERD  here today complaining of 3-4 days of a productive cough with whitish/clear sputum but noted small amount of red blood yesterday. She attributes this to throat irritated by cough.  She has been experiencing chest heaviness, had some SOB and wheezing last night. States that she has been sleeping in a sitting position due to congestion and cough. She believes the symptoms started as a "sinus infection" with post-nasal drainage.   She denies having a fever but reports experiencing chills last night, which resolved after adding extra layers of clothing.  Cough The current episode started in the past 7 days. The problem has been unchanged. The problem occurs every few minutes. The cough is Productive of sputum. Associated symptoms include chills, ear congestion, ear pain (right), nasal congestion, postnasal drip, rhinorrhea, a sore throat and wheezing. Pertinent negatives include no chest pain, fever, headaches, heartburn, myalgias, rash or weight loss. The symptoms are aggravated by lying down. She has tried nothing for the symptoms. Her past medical history is significant for asthma and environmental allergies. There is no history of COPD.   She has a history of family members being diagnosed with pneumonia recently.   Negative for GI symptoms but reports nausea but it is a problem has been going on for 2 months with associated dizziness, she is being treated for vertigo.  Her appetite has been normal, and she has not experienced any significant changes in her blood sugar levels.  She denies any recent bruising without trauma, blood in the stool, melena, or gross hematuria.  She reports a history of asthma and has used Albuterol inhaler in the past, no hx of tobacco use.    Review of Systems  Constitutional:  Positive for chills. Negative for fever and weight loss.  HENT:  Positive for congestion, ear pain (right), postnasal drip, rhinorrhea and sore throat. Negative for ear discharge and hearing loss.   Respiratory:  Positive for cough and wheezing.   Cardiovascular:  Negative for chest pain.  Gastrointestinal:  Negative for abdominal pain, diarrhea, heartburn and vomiting.  Genitourinary:  Negative for decreased urine volume and dysuria.  Musculoskeletal:  Negative for joint swelling and myalgias.  Skin:  Negative for rash.  Allergic/Immunologic: Positive for environmental allergies.  Neurological:  Negative for syncope, weakness and headaches.  See other pertinent positives and negatives in HPI.  Current Outpatient Medications on File Prior to Visit  Medication Sig Dispense Refill   acetaminophen (TYLENOL) 500 MG tablet Take 1,000 mg by mouth every 8 (eight) hours as needed for moderate pain.     ALPRAZolam (XANAX) 0.25 MG tablet Take 1 tablet (0.25 mg total) by mouth 2 (two) times daily as needed for anxiety. 60 tablet 0   amLODipine (NORVASC) 5 MG tablet TAKE 1 TABLET(5 MG) BY MOUTH DAILY 90 tablet 3   aspirin 81 MG tablet Take 81 mg by mouth at bedtime.     atorvastatin (LIPITOR) 10 MG tablet TAKE 1 TABLET(10 MG) BY MOUTH DAILY 90 tablet 3   betamethasone valerate ointment (VALISONE) 0.1 % Apply 1 Application topically 2 (two) times daily. Place in the affected area twice a bid for 2 weeks and then place on the area at night twice weekly. 45 g 0   blood glucose meter kit  and supplies KIT Dispense based on patient and insurance preference. Use up to four times daily as directed. 1 each 0   Carboxymethylcellul-Glycerin (LUBRICATING EYE DROPS OP) Place 1 drop into both eyes daily as needed (dry eyes).     cholecalciferol (VITAMIN D3) 25 MCG (1000 UT) tablet Take 1,000 Units by mouth daily.     citalopram (CELEXA) 20 MG tablet TAKE 1 TABLET(20 MG) BY MOUTH  DAILY 90 tablet 1   Emollient (GOLD BOND DIABETICS DRY SKIN) CREA Apply 1 application. topically at bedtime.     furosemide (LASIX) 20 MG tablet Take one tab daily x 3 days as needed for swelling in legs 90 tablet 0   glucose blood test strip Use to check blood glucose TID 200 each 0   Lancets 28G MISC Use to check blood glucose 4 times daily 100 each 0   levothyroxine (SYNTHROID) 88 MCG tablet TAKE 1 TABLET(88 MCG) BY MOUTH DAILY 90 tablet 3   Lidocaine 4 % AERO Apply 1 spray topically at bedtime as needed (foot pain).     losartan-hydrochlorothiazide (HYZAAR) 100-25 MG tablet TAKE 1 TABLET BY MOUTH DAILY 90 tablet 3   meclizine (ANTIVERT) 25 MG tablet Take 1 tablet (25 mg total) by mouth 3 (three) times daily as needed for dizziness. 30 tablet 0   metFORMIN (GLUCOPHAGE) 500 MG tablet TAKE 1 TABLET(500 MG) BY MOUTH TWICE DAILY WITH A MEAL 180 tablet 0   ondansetron (ZOFRAN) 4 MG tablet Take 1 tablet (4 mg total) by mouth every 8 (eight) hours as needed for nausea or vomiting. 20 tablet 0   pramipexole (MIRAPEX) 1.5 MG tablet TAKE 1 TABLET(1.5 MG) BY MOUTH AT BEDTIME 90 tablet 1   Prenatal Vit-Fe Fumarate-FA (PRENATAL VITAMIN PO) Take 1 tablet by mouth daily.     vitamin B-12 (CYANOCOBALAMIN) 1000 MCG tablet Take 1,000 mcg by mouth daily.     nitroGLYCERIN (NITROSTAT) 0.4 MG SL tablet Place 1 tablet (0.4 mg total) under the tongue every 5 (five) minutes as needed for chest pain. 25 tablet 3   No current facility-administered medications on file prior to visit.    Past Medical History:  Diagnosis Date   Allergy    Anemia    Anxiety    ANXIETY DEPRESSION 09/15/2007   Arthritis    ASTHMA 05/15/2009   Asthma    DIABETES MELLITUS, TYPE II 12/11/2006   DIVERTICULOSIS, COLON 12/11/2006   Edema 01/15/2009   Gout    pt denies; toe was broken   Headache(784.0) 12/11/2006   Hyperlipidemia    Hypertension    HYPERTENSION NEC 08/14/2007   HYPOTHYROIDISM 12/11/2006   PELVIC PAIN, CHRONIC  08/14/2007   Restless leg syndrome    Allergies  Allergen Reactions   Benadryl [Diphenhydramine Hcl] Anaphylaxis   Ace Inhibitors Cough   Amoxicillin     Unknown reaction   Aspirin Nausea And Vomiting    Tolerates low dose aspirin    Codeine Nausea And Vomiting   Fish Allergy Nausea And Vomiting   Hydrocodone Nausea And Vomiting   Indomethacin     Unknown reaction    Pentazocine Lactate     passing out   Promethazine Hcl     Unknown reaction    Quinine     itching redface   Chlorphen-Phenyleph-Methscop Rash    Social History   Socioeconomic History   Marital status: Widowed    Spouse name: Not on file   Number of children: Not on file   Years of  education: Not on file   Highest education level: 12th grade  Occupational History   Not on file  Tobacco Use   Smoking status: Never   Smokeless tobacco: Never  Vaping Use   Vaping status: Never Used  Substance and Sexual Activity   Alcohol use: Yes    Comment: wine occ   Drug use: No   Sexual activity: Not Currently    Comment: hysterectomy, <5 sexual partners, <16 y/o, no STD  Other Topics Concern   Not on file  Social History Narrative   Not on file   Social Determinants of Health   Financial Resource Strain: Low Risk  (05/21/2022)   Overall Financial Resource Strain (CARDIA)    Difficulty of Paying Living Expenses: Not hard at all  Food Insecurity: Food Insecurity Present (05/21/2022)   Hunger Vital Sign    Worried About Running Out of Food in the Last Year: Sometimes true    Ran Out of Food in the Last Year: Sometimes true  Transportation Needs: No Transportation Needs (05/21/2022)   PRAPARE - Administrator, Civil Service (Medical): No    Lack of Transportation (Non-Medical): No  Physical Activity: Insufficiently Active (05/21/2022)   Exercise Vital Sign    Days of Exercise per Week: 2 days    Minutes of Exercise per Session: 10 min  Stress: Stress Concern Present (05/21/2022)   [employer]  of Occupational Health - Occupational Stress Questionnaire    Feeling of Stress : To some extent  Social Connections: Moderately Integrated (05/21/2022)   Social Connection and Isolation Panel [NHANES]    Frequency of Communication with Friends and Family: More than three times a week    Frequency of Social Gatherings with Friends and Family: More than three times a week    Attends Religious Services: More than 4 times per year    Active Member of [employer] or Organizations: Yes    Attends Banker Meetings: More than 4 times per year    Marital Status: Widowed    Vitals:   11/02/22 1046  BP: 126/70  Pulse: 76  Resp: 16  Temp: 98.7 F (37.1 C)  SpO2: 95%   Body mass index is 31.6 kg/m.  Physical Exam Vitals and nursing note reviewed.  Constitutional:      General: She is not in acute distress.    Appearance: She is well-developed. She is not ill-appearing.  HENT:     Head: Normocephalic and atraumatic.     Right Ear: Ear canal and external ear normal. A middle ear effusion is present. Tympanic membrane is not erythematous.     Left Ear: Tympanic membrane, ear canal and external ear normal.     Nose: Rhinorrhea present.     Right Sinus: No maxillary sinus tenderness or frontal sinus tenderness.     Left Sinus: No maxillary sinus tenderness or frontal sinus tenderness.     Mouth/Throat:     Mouth: Mucous membranes are moist.     Tongue: No lesions.     Pharynx: Oropharynx is clear. Uvula midline. Posterior oropharyngeal erythema (MIld) and postnasal drip present. No oropharyngeal exudate or uvula swelling.  Eyes:     Conjunctiva/sclera: Conjunctivae normal.  Cardiovascular:     Rate and Rhythm: Normal rate and regular rhythm.     Heart sounds: No murmur heard. Pulmonary:     Effort: Pulmonary effort is normal. No respiratory distress.     Breath sounds: Normal breath sounds. No stridor.  Musculoskeletal:  Right lower leg: No edema.     Left lower leg: No  edema.  Lymphadenopathy:     Head:     Right side of head: No submandibular adenopathy.     Left side of head: No submandibular adenopathy.     Cervical: No cervical adenopathy.  Skin:    General: Skin is warm.     Findings: No erythema or rash.  Neurological:     Mental Status: She is alert and oriented to person, place, and time.  Psychiatric:        Mood and Affect: Mood and affect normal.    ASSESSMENT AND PLAN:  [name] was seen today for acute respiratory symptoms.  Respiratory tract infection Symptoms suggests a viral etiology, I explained patient that symptomatic treatment is recommended in this case, so I do not think abx is needed at this time. Sent Rx for Doxycycline to start if symptoms get worse or she develops fever this weekend. She has tested negative for COVID and strep.   Instructed to monitor for signs of complications, including new onset of fever among some, clearly instructed about warning signs. I also explained that cough and nasal congestion can last a few days and sometimes weeks. F/U as needed.  -     Doxycycline Hyclate; Take 1 tablet (100 mg total) by mouth 2 (two) times daily for 7 days.  Dispense: 14 tablet; Refill: 0  Sore throat -     POCT rapid strep A  Cough, unspecified type Lung auscultation today negative for rales and wheezing. Examination does not suggest a serious process. We dos not have imaging service today because system is down. Episode of blood in sputum could have been from throat irritation. Due to [employer] issues radiology service is not available.  -     POC COVID-19 BinaxNow -     Benzonatate; Take 2 capsules (200 mg total) by mouth 2 (two) times daily as needed for up to 10 days.  Dispense: 24 capsule; Refill: 0  Wheezing Hx of asthma. Albuterol inh 2 puff every 6 hours for a week then as needed for wheezing or shortness of breath.  I do not think systemic steroid is needed at this time.  -     Albuterol  Sulfate HFA; Inhale 2 puffs into the lungs every 6 (six) hours as needed for wheezing or shortness of breath.  Dispense: 8 g; Refill: 0  Return if symptoms worsen or fail to improve.  Cotton Beckley G. [place], MD  [facility]. [place] office.

## 2022-11-05 ENCOUNTER — Ambulatory Visit: Payer: Medicare Other

## 2022-11-05 DIAGNOSIS — R42 Dizziness and giddiness: Secondary | ICD-10-CM | POA: Diagnosis not present

## 2022-11-05 DIAGNOSIS — H8113 Benign paroxysmal vertigo, bilateral: Secondary | ICD-10-CM | POA: Diagnosis not present

## 2022-11-05 DIAGNOSIS — R2681 Unsteadiness on feet: Secondary | ICD-10-CM

## 2022-11-05 NOTE — Therapy (Signed)
OUTPATIENT PHYSICAL THERAPY VESTIBULAR TREATMENT   Patient Name: [name] MRN: [id number] DOB:07-25-1950, 72 y.o., female Today's Date: 11/05/2022  END OF SESSION:  PT End of Session - 11/05/22 0800     Visit Number 4    Number of Visits 8    Date for PT Re-Evaluation 11/22/22    Authorization Type Medicare/MCD    PT Start Time 0800    PT Stop Time 0845    PT Time Calculation (min) 45 min    Activity Tolerance Patient tolerated treatment well    Behavior During Therapy [facility] for tasks assessed/performed            Past Medical History:  Diagnosis Date   Allergy    Anemia    Anxiety    ANXIETY DEPRESSION 09/15/2007   Arthritis    ASTHMA 05/15/2009   Asthma    DIABETES MELLITUS, TYPE II 12/11/2006   DIVERTICULOSIS, COLON 12/11/2006   Edema 01/15/2009   Gout    pt denies; toe was broken   Headache(784.0) 12/11/2006   Hyperlipidemia    Hypertension    HYPERTENSION NEC 08/14/2007   HYPOTHYROIDISM 12/11/2006   PELVIC PAIN, CHRONIC 08/14/2007   Restless leg syndrome    Past Surgical History:  Procedure Laterality Date   ABDOMINAL HYSTERECTOMY     APPENDECTOMY     Bladder Suspension     COLONOSCOPY  10/08/2017   Dr. Marina Goodell   LEFT HEART CATH AND CORONARY ANGIOGRAPHY N/A 09/05/2021   Procedure: LEFT HEART CATH AND CORONARY ANGIOGRAPHY;  Surgeon: Lennette Bihari, MD;  Location: [facility] INVASIVE CV LAB;  Service: Cardiovascular;  Laterality: N/A;   Patient Active Problem List   Diagnosis Date Noted   Chest pain of uncertain etiology    Fracture of second toe, left, closed, initial encounter 09/26/2017   GERD (gastroesophageal reflux disease) 10/19/2014   Essential hypertension 08/16/2014   Breast pain, right 05/20/2014   Restless leg syndrome 05/20/2014   Hip pain, bilateral 10/23/2011   Knee pain, bilateral 10/23/2011   EDEMA 01/15/2009   Hypothyroidism 12/11/2006   Diabetes type 2, uncontrolled 12/11/2006   DIVERTICULOSIS, COLON 12/11/2006    HEADACHE 12/11/2006    PCP: Shirline Frees, NP REFERRING PROVIDER: Shirline Frees, NP  REFERRING DIAG:  H81.13 (ICD-10-CM) - Benign paroxysmal positional vertigo due to bilateral vestibular disorder    THERAPY DIAG:  Dizziness and giddiness  Unsteadiness on feet  ONSET DATE: May 2024  Rationale for Evaluation and Treatment: Rehabilitation  SUBJECTIVE:   SUBJECTIVE STATEMENT: The dizziness was gone for about a day and a half.  Currently, the dizziness isn't bad, mostly off-balance Pt accompanied by: self  PERTINENT HISTORY: past medical history of Allergy, Anemia, Anxiety, ANXIETY DEPRESSION (09/15/2007), Arthritis, ASTHMA (05/15/2009), Asthma, DIABETES MELLITUS, TYPE II (12/11/2006), DIVERTICULOSIS, COLON (12/11/2006), Edema (01/15/2009), Gout, Headache(784.0) (12/11/2006), Hyperlipidemia, Hypertension, HYPERTENSION NEC (08/14/2007), HYPOTHYROIDISM (12/11/2006), PELVIC PAIN, CHRONIC (08/14/2007), and Restless leg syndrome.  PAIN:  Are you having pain?  Reports chronic LBP but no worse  PRECAUTIONS: None  WEIGHT BEARING RESTRICTIONS: No  FALLS: Has patient fallen in last 6 months? Yes. Number of falls 2 falling forward since onset of dizziness  LIVING ENVIRONMENT: Lives with: lives alone Lives in: House/apartment Stairs: No Has following equipment at home:  has cane  PLOF: Independent  PATIENT GOALS: Get rid of dizziness  OBJECTIVE:   TODAY'S TREATMENT: 11/05/22 Activity Comments  Supine to 2 pillows -no guarding/dizziness  Left/right roll test -no symptoms/nystagmus  Left Dix-Hallpike -no nystagmus, motion sensitivity  present  Right Dix-Hallpike -no nystagmus, motion sensitivity present  Corner balance -EO/EC x 30 sec -head turns 3x EO/EC  VOR x 1 (standing) -no symptoms horizontal, some provoked with vertical  VOR cancellation -horizontal 5 reps  -vertical 5 reps     [facility] Adult PT Treatment:                                                DATE:  10/30/22 Neuromuscular re-ed: Positional testing Sit>supine on 2 pillows- no nystagmus or dizziness (moves guarded) Rolling R= significant apogeotropic nystagmus that was persistent (waited 30 sec then moved to supine) Rolling L=mild nystagmus  Retested after canolith repositioning and patient had no nystagmus viewed in room light Canolith repositioining: Lorretta Harp (2011) for L horizontal cupulolithiasis with vibration 1st and 4th positions Rested at 3rd position due to nausea and dizziness Symptoms converted to geotropic with R roll during maneuver    PATIENT EDUCATION: Education details: assessment details, PT intervention, and HEP initiation Person educated: Patient Education method: Explanation Education comprehension: verbalized understanding  HOME EXERCISE PROGRAM: Access Code: [email or web address] URL: [email or web address] Date: 10/25/2022 Prepared by: Shary Decamp  Access Code: [email or web address] URL: [email or web address] Date: 11/05/2022 Prepared by: Shary Decamp  Exercises - Standing Gaze Stabilization with Head Rotation  - 1 x daily - 7 x weekly - 3 sets - 10 reps - Standing Gaze Stabilization with Head Nod  - 1 x daily - 7 x weekly - 3 sets - 10 reps - Standing VOR Cancellation  - 1 x daily - 7 x weekly - 3 sets - 10 reps - Corner Balance Feet Together With Eyes Open  - 1 x daily - 7 x weekly - 3 sets - 30 sec hold - Corner Balance Feet Together With Eyes Closed  - 1 x daily - 7 x weekly - 3 sets - 30 sec hold - Corner Balance Feet Together: Eyes Open With Head Turns  - 1 x daily - 7 x weekly - 3 sets - 3 reps - Corner Balance Feet Together: Eyes Closed With Head Turns  - 1 x daily - 7 x weekly - 3 sets - 3 reps  ____________________________________________________________________________________________________________________ (Measures in this section from initial evaluation unless otherwise noted) SENSATION: NT, pt reports diabetic neuropathy  affecting dorsal feet  EDEMA:  BLE lower leg >  POSTURE:  No Significant postural limitations  Cervical ROM:   Extension/flexion guarded due to symptoms  GAIT: Gait pattern: WFL Distance walked:  Assistive device utilized: None Level of assistance: Complete Independence  VESTIBULAR ASSESSMENT:  GENERAL OBSERVATION: wears bifocals   SYMPTOM BEHAVIOR:  Subjective history: sudden onset dizziness in May, present ever since  Non-Vestibular symptoms:  none of note  Type of dizziness: Imbalance (Disequilibrium) and Spinning/Vertigo  Frequency: everyday  Duration: much of the day and increased with certain positions  Aggravating factors: Induced by position change: supine to sit and sit to stand, Induced by motion: looking up at the ceiling, bending down to the ground, and turning head quickly, Worse outside or in busy environment, and Moving eyes  Relieving factors: rest, slow movements, and avoid busy/distracting environments  Progression of symptoms: unchanged  OCULOMOTOR EXAM:  Ocular Alignment: normal  Ocular ROM: No Limitations  Spontaneous Nystagmus: absent  Gaze-Induced Nystagmus: absent  Smooth Pursuits: intact, symptomtaic  Saccades: intact, reports 3/10 symptoms horizontal, 5/10 vertical  Convergence/Divergence: 6  cm   VESTIBULAR - OCULAR REFLEX:   Slow VOR: Comment: slower speed, reports blurriness  VOR Cancellation: Normal  Head-Impulse Test: HIT Right: negative HIT Left: negative  Dynamic Visual Acuity: Not able to be assessed TBD   POSITIONAL TESTING: Right Dix-Hallpike: no nystagmus Left Dix-Hallpike: upbeating, left nystagmus Right Roll Test: no nystagmus Left Roll Test: no nystagmus  FUNCTIONAL GAIT: Functional gait assessment: TBD  M-CTSIB  Condition 1: Firm Surface, EO 30 Sec, Normal and Mild Sway  Condition 2: Firm Surface, EC 30 Sec, Mild Sway  Condition 3: Foam Surface, EO 30 Sec, Moderate Sway  Condition 4: Foam Surface, EC 9 Sec, Moderate  and Severe Sway   GOALS: Goals reviewed with patient? Yes  SHORT TERM GOALS: Target date: same as LTG  LONG TERM GOALS: Target date: 11/22/2022   Pt to report absence of positional dizziness  Baseline: +left Dix-Hallpike Goal status: INITIAL  2.  Pt to report absence of symptoms with standing VOR x 1 x 30 sec Baseline: 5 reps Goal status: INITIAL  3.  Demo improved postural stability per mild sway x30 sec condition 4 M-CTSIB to improve postural stability and safety with ADL Baseline:  Goal status: INITIAL  ASSESSMENT:  CLINICAL IMPRESSION: Repeat positional testing reveals no nystagmus and only notable for some motion sensitivity when transitioning between position changes.  Initiated additional VOR and balance/multi-sensory activities to address motion sensitivity and some guarding to large head/neck movements.  Notes main discomfort with large vertical head movements and vertical VOR.  Revised HEP to address deficits and limitations and will follow-up with visits as indicated to address POC details  OBJECTIVE IMPAIRMENTS: decreased activity tolerance, decreased balance, and dizziness.   PLAN:  PT FREQUENCY: 1-2x/week  PT DURATION: 4 weeks  PLANNED INTERVENTIONS: Therapeutic exercises, Therapeutic activity, Neuromuscular re-education, Balance training, Gait training, Patient/Family education, Self Care, Joint mobilization, Stair training, Vestibular training, Canalith repositioning, DME instructions, Aquatic Therapy, Dry Needling, Spinal mobilization, Cryotherapy, Moist heat, Taping, Ionotophoresis 4mg /ml Dexamethasone, and Manual therapy  PLAN FOR NEXT SESSION: re-assess positional dizziness, Right Kim maneuver if needed. HEP review   8:42 AM, 11/05/22 M. Shary Decamp, PT, DPT Physical Therapist- [place] Office Number: [phone]   .m

## 2022-11-08 DIAGNOSIS — R059 Cough, unspecified: Secondary | ICD-10-CM | POA: Diagnosis not present

## 2022-11-09 ENCOUNTER — Ambulatory Visit: Payer: Medicare Other

## 2022-11-09 DIAGNOSIS — R2681 Unsteadiness on feet: Secondary | ICD-10-CM | POA: Diagnosis not present

## 2022-11-09 DIAGNOSIS — R42 Dizziness and giddiness: Secondary | ICD-10-CM

## 2022-11-09 DIAGNOSIS — H8113 Benign paroxysmal vertigo, bilateral: Secondary | ICD-10-CM | POA: Diagnosis not present

## 2022-11-09 NOTE — Therapy (Signed)
OUTPATIENT PHYSICAL THERAPY VESTIBULAR TREATMENT   Patient Name: [name] MRN: [id number] DOB:Nov 16, 1950, 72 y.o., female Today's Date: 11/09/2022  END OF SESSION:  PT End of Session - 11/09/22 0801     Visit Number 5    Number of Visits 8    Date for PT Re-Evaluation 11/22/22    Authorization Type Medicare/MCD    PT Start Time 0800    PT Stop Time 0845    PT Time Calculation (min) 45 min    Activity Tolerance Patient tolerated treatment well    Behavior During Therapy [facility] for tasks assessed/performed            Past Medical History:  Diagnosis Date   Allergy    Anemia    Anxiety    ANXIETY DEPRESSION 09/15/2007   Arthritis    ASTHMA 05/15/2009   Asthma    DIABETES MELLITUS, TYPE II 12/11/2006   DIVERTICULOSIS, COLON 12/11/2006   Edema 01/15/2009   Gout    pt denies; toe was broken   Headache(784.0) 12/11/2006   Hyperlipidemia    Hypertension    HYPERTENSION NEC 08/14/2007   HYPOTHYROIDISM 12/11/2006   PELVIC PAIN, CHRONIC 08/14/2007   Restless leg syndrome    Past Surgical History:  Procedure Laterality Date   ABDOMINAL HYSTERECTOMY     APPENDECTOMY     Bladder Suspension     COLONOSCOPY  10/08/2017   Dr. Marina Goodell   LEFT HEART CATH AND CORONARY ANGIOGRAPHY N/A 09/05/2021   Procedure: LEFT HEART CATH AND CORONARY ANGIOGRAPHY;  Surgeon: Lennette Bihari, MD;  Location: [facility] IN[facility];  Service: Cardiovascular;  Laterality: N/A;   Patient Active Problem List   Diagnosis Date Noted   Chest pain of uncertain etiology    Fracture of second toe, left, closed, initial encounter 09/26/2017   GERD (gastroesophageal reflux disease) 10/19/2014   Essential hypertension 08/16/2014   Breast pain, right 05/20/2014   Restless leg syndrome 05/20/2014   Hip pain, bilateral 10/23/2011   Knee pain, bilateral 10/23/2011   EDEMA 01/15/2009   Hypothyroidism 12/11/2006   Diabetes type 2, uncontrolled 12/11/2006   DIVERTICULOSIS, COLON 12/11/2006    HEADACHE 12/11/2006    PCP: Shirline Frees, NP REFERRING PROVIDER: Shirline Frees, NP  REFERRING DIAG:  H81.13 (ICD-10-CM) - Benign paroxysmal positional vertigo due to bilateral vestibular disorder    THERAPY DIAG:  Dizziness and giddiness  Unsteadiness on feet  ONSET DATE: May 2024  Rationale for Evaluation and Treatment: Rehabilitation  SUBJECTIVE:   SUBJECTIVE STATEMENT: Feeling more woozy today. Not as dizzy. Still having some respiratory issues. No otalgia Pt accompanied by: self  PERTINENT HISTORY: past medical history of Allergy, Anemia, Anxiety, ANXIETY DEPRESSION (09/15/2007), Arthritis, ASTHMA (05/15/2009), Asthma, DIABETES MELLITUS, TYPE II (12/11/2006), DIVERTICULOSIS, COLON (12/11/2006), Edema (01/15/2009), Gout, Headache(784.0) (12/11/2006), Hyperlipidemia, Hypertension, HYPERTENSION NEC (08/14/2007), HYPOTHYROIDISM (12/11/2006), PELVIC PAIN, CHRONIC (08/14/2007), and Restless leg syndrome.  PAIN:  Are you having pain?  Reports chronic LBP but no worse  PRECAUTIONS: None  WEIGHT BEARING RESTRICTIONS: No  FALLS: Has patient fallen in last 6 months? Yes. Number of falls 2 falling forward since onset of dizziness  LIVING ENVIRONMENT: Lives with: lives alone Lives in: House/apartment Stairs: No Has following equipment at home:  has cane  PLOF: Independent  PATIENT GOALS: Get rid of dizziness  OBJECTIVE:   TODAY'S TREATMENT: 11/09/22 Activity Comments  Left roll test No symptoms, no nystagmus  Right roll test Possibly some apogeotropic? And slight symptoms  Deep head hang Single, slow refixation  left horizontal  Left Kim Maneuver Vibration in 1st and 4th position  Right roll test -appears to be apogeotropic again, feeling of left spinning when returning to supine  Left Kim maneuver Vibration in 1st and 4th position        TODAY'S TREATMENT: 11/05/22 Activity Comments  Supine to 2 pillows -no guarding/dizziness  Left/right roll test -no  symptoms/nystagmus  Left Dix-Hallpike -no nystagmus, motion sensitivity present  Right Dix-Hallpike -no nystagmus, motion sensitivity present  Corner balance -EO/EC x 30 sec -head turns 3x EO/EC  VOR x 1 (standing) -no symptoms horizontal, some provoked with vertical  VOR cancellation -horizontal 5 reps  -vertical 5 reps     [facility] Adult PT Treatment:                                                DATE: 10/30/22 Neuromuscular re-ed: Positional testing Sit>supine on 2 pillows- no nystagmus or dizziness (moves guarded) Rolling R= significant apogeotropic nystagmus that was persistent (waited 30 sec then moved to supine) Rolling L=mild nystagmus  Retested after canolith repositioning and patient had no nystagmus viewed in room light Canolith repositioining: Lorretta Harp (2011) for L horizontal cupulolithiasis with vibration 1st and 4th positions Rested at 3rd position due to nausea and dizziness Symptoms converted to geotropic with R roll during maneuver    PATIENT EDUCATION: Education details: assessment details, PT intervention, and HEP initiation Person educated: Patient Education method: Explanation Education comprehension: verbalized understanding  HOME EXERCISE PROGRAM: Access Code: [email or web address] URL: [email or web address] Date: 10/25/2022 Prepared by: Shary Decamp  Access Code: [email or web address] URL: [email or web address] Date: 11/05/2022 Prepared by: Shary Decamp  Exercises - Standing Gaze Stabilization with Head Rotation  - 1 x daily - 7 x weekly - 3 sets - 10 reps - Standing Gaze Stabilization with Head Nod  - 1 x daily - 7 x weekly - 3 sets - 10 reps - Standing VOR Cancellation  - 1 x daily - 7 x weekly - 3 sets - 10 reps - Corner Balance Feet Together With Eyes Open  - 1 x daily - 7 x weekly - 3 sets - 30 sec hold - Corner Balance Feet Together With Eyes Closed  - 1 x daily - 7 x weekly - 3 sets - 30 sec hold - Corner Balance Feet Together: Eyes Open  With Head Turns  - 1 x daily - 7 x weekly - 3 sets - 3 reps - Corner Balance Feet Together: Eyes Closed With Head Turns  - 1 x daily - 7 x weekly - 3 sets - 3 reps  ____________________________________________________________________________________________________________________ (Measures in this section from initial evaluation unless otherwise noted) SENSATION: NT, pt reports diabetic neuropathy affecting dorsal feet  EDEMA:  BLE lower leg >  POSTURE:  No Significant postural limitations  Cervical ROM:   Extension/flexion guarded due to symptoms  GAIT: Gait pattern: WFL Distance walked:  Assistive device utilized: None Level of assistance: Complete Independence  VESTIBULAR ASSESSMENT:  GENERAL OBSERVATION: wears bifocals   SYMPTOM BEHAVIOR:  Subjective history: sudden onset dizziness in May, present ever since  Non-Vestibular symptoms:  none of note  Type of dizziness: Imbalance (Disequilibrium) and Spinning/Vertigo  Frequency: everyday  Duration: much of the day and increased with certain positions  Aggravating factors: Induced by position change: supine to sit and sit to stand, Induced by motion: looking up at the  ceiling, bending down to the ground, and turning head quickly, Worse outside or in busy environment, and Moving eyes  Relieving factors: rest, slow movements, and avoid busy/distracting environments  Progression of symptoms: unchanged  OCULOMOTOR EXAM:  Ocular Alignment: normal  Ocular ROM: No Limitations  Spontaneous Nystagmus: absent  Gaze-Induced Nystagmus: absent  Smooth Pursuits: intact, symptomtaic  Saccades: intact, reports 3/10 symptoms horizontal, 5/10 vertical  Convergence/Divergence: 6 cm   VESTIBULAR - OCULAR REFLEX:   Slow VOR: Comment: slower speed, reports blurriness  VOR Cancellation: Normal  Head-Impulse Test: HIT Right: negative HIT Left: negative  Dynamic Visual Acuity: Not able to be assessed TBD   POSITIONAL TESTING: Right  Dix-Hallpike: no nystagmus Left Dix-Hallpike: upbeating, left nystagmus Right Roll Test: no nystagmus Left Roll Test: no nystagmus  FUNCTIONAL GAIT: Functional gait assessment: TBD  M-CTSIB  Condition 1: Firm Surface, EO 30 Sec, Normal and Mild Sway  Condition 2: Firm Surface, EC 30 Sec, Mild Sway  Condition 3: Foam Surface, EO 30 Sec, Moderate Sway  Condition 4: Foam Surface, EC 9 Sec, Moderate and Severe Sway   GOALS: Goals reviewed with patient? Yes  SHORT TERM GOALS: Target date: same as LTG  LONG TERM GOALS: Target date: 11/22/2022   Pt to report absence of positional dizziness  Baseline: +left Dix-Hallpike Goal status: INITIAL  2.  Pt to report absence of symptoms with standing VOR x 1 x 30 sec Baseline: 5 reps Goal status: INITIAL  3.  Demo improved postural stability per mild sway x30 sec condition 4 M-CTSIB to improve postural stability and safety with ADL Baseline:  Goal status: INITIAL  ASSESSMENT:  CLINICAL IMPRESSION: Experiencing positional dizziness with slight apogeotropic nystagmus observed in left roll test position. Treatment today of left Kim Maneuver w/ vibration in 1st and 4th positions.  Improved symptoms and no nystgamus observed in repeat testing after 2nd rep of Kim.  Recommend sleeping on left side for forced position. Follow-up on Monday  OBJECTIVE IMPAIRMENTS: decreased activity tolerance, decreased balance, and dizziness.   PLAN:  PT FREQUENCY: 1-2x/week  PT DURATION: 4 weeks  PLANNED INTERVENTIONS: Therapeutic exercises, Therapeutic activity, Neuromuscular re-education, Balance training, Gait training, Patient/Family education, Self Care, Joint mobilization, Stair training, Vestibular training, Canalith repositioning, DME instructions, Aquatic Therapy, Dry Needling, Spinal mobilization, Cryotherapy, Moist heat, Taping, Ionotophoresis 4mg /ml Dexamethasone, and Manual therapy  PLAN FOR NEXT SESSION: re-assess positional dizziness, Right Kim  maneuver if needed. HEP review   8:01 AM, 11/09/22 M. Shary Decamp, PT, DPT Physical Therapist- [place] Office Number: [phone]

## 2022-11-12 ENCOUNTER — Ambulatory Visit: Payer: Medicare Other

## 2022-11-12 DIAGNOSIS — R42 Dizziness and giddiness: Secondary | ICD-10-CM | POA: Diagnosis not present

## 2022-11-12 DIAGNOSIS — H8113 Benign paroxysmal vertigo, bilateral: Secondary | ICD-10-CM | POA: Diagnosis not present

## 2022-11-12 DIAGNOSIS — R2681 Unsteadiness on feet: Secondary | ICD-10-CM

## 2022-11-12 NOTE — Therapy (Signed)
OUTPATIENT PHYSICAL THERAPY VESTIBULAR TREATMENT   Patient Name: [name] MRN: [id number] DOB:May 21, 1950, 72 y.o., female Today's Date: 11/12/2022  END OF SESSION:  PT End of Session - 11/12/22 0848     Visit Number 6    Number of Visits 8    Date for PT Re-Evaluation 11/22/22    Authorization Type Medicare/MCD    PT Start Time 0845    PT Stop Time 0930    PT Time Calculation (min) 45 min    Activity Tolerance Patient tolerated treatment well    Behavior During Therapy [facility] for tasks assessed/performed            Past Medical History:  Diagnosis Date   Allergy    Anemia    Anxiety    ANXIETY DEPRESSION 09/15/2007   Arthritis    ASTHMA 05/15/2009   Asthma    DIABETES MELLITUS, TYPE II 12/11/2006   DIVERTICULOSIS, COLON 12/11/2006   Edema 01/15/2009   Gout    pt denies; toe was broken   Headache(784.0) 12/11/2006   Hyperlipidemia    Hypertension    HYPERTENSION NEC 08/14/2007   HYPOTHYROIDISM 12/11/2006   PELVIC PAIN, CHRONIC 08/14/2007   Restless leg syndrome    Past Surgical History:  Procedure Laterality Date   ABDOMINAL HYSTERECTOMY     APPENDECTOMY     Bladder Suspension     COLONOSCOPY  10/08/2017   Dr. Marina Goodell   LEFT HEART CATH AND CORONARY ANGIOGRAPHY N/A 09/05/2021   Procedure: LEFT HEART CATH AND CORONARY ANGIOGRAPHY;  Surgeon: Lennette Bihari, MD;  Location: [facility];  Service: Cardiovascular;  Laterality: N/A;   Patient Active Problem List   Diagnosis Date Noted   Chest pain of uncertain etiology    Fracture of second toe, left, closed, initial encounter 09/26/2017   GERD (gastroesophageal reflux disease) 10/19/2014   Essential hypertension 08/16/2014   Breast pain, right 05/20/2014   Restless leg syndrome 05/20/2014   Hip pain, bilateral 10/23/2011   Knee pain, bilateral 10/23/2011   EDEMA 01/15/2009   Hypothyroidism 12/11/2006   Diabetes type 2, uncontrolled 12/11/2006   DIVERTICULOSIS, COLON 12/11/2006    HEADACHE 12/11/2006    PCP: Shirline Frees, NP REFERRING PROVIDER: Shirline Frees, NP  REFERRING DIAG:  H81.13 (ICD-10-CM) - Benign paroxysmal positional vertigo due to bilateral vestibular disorder    THERAPY DIAG:  Dizziness and giddiness  Unsteadiness on feet  ONSET DATE: May 2024  Rationale for Evaluation and Treatment: Rehabilitation  SUBJECTIVE:   SUBJECTIVE STATEMENT: Noted some wooziness and unsteadiness. Felt dizzy when laying down at night. Feeling right ear pain/pressure, like it is stopped up. Pt accompanied by: self  PERTINENT HISTORY: past medical history of Allergy, Anemia, Anxiety, ANXIETY DEPRESSION (09/15/2007), Arthritis, ASTHMA (05/15/2009), Asthma, DIABETES MELLITUS, TYPE II (12/11/2006), DIVERTICULOSIS, COLON (12/11/2006), Edema (01/15/2009), Gout, Headache(784.0) (12/11/2006), Hyperlipidemia, Hypertension, HYPERTENSION NEC (08/14/2007), HYPOTHYROIDISM (12/11/2006), PELVIC PAIN, CHRONIC (08/14/2007), and Restless leg syndrome.  PAIN:  Are you having pain?  Reports chronic LBP but no worse  PRECAUTIONS: None  WEIGHT BEARING RESTRICTIONS: No  FALLS: Has patient fallen in last 6 months? Yes. Number of falls 2 falling forward since onset of dizziness  LIVING ENVIRONMENT: Lives with: lives alone Lives in: House/apartment Stairs: No Has following equipment at home:  has cane  PLOF: Independent  PATIENT GOALS: Get rid of dizziness  OBJECTIVE:   TODAY'S TREATMENT: 11/12/22 Activity Comments  Left roll test No dizziness/nystagmus  Right roll test No dizziness/nystagmus, feels ear pressure  Left Dix-Hallpike  No nystagmus/no dizziness. Motion sensitivity with arising to long-sitting  Right Dix-Hallpike No nystagmus/no dizziness. Motion sensitivity with arising to long-sitting  Standing VOR x 1 -horizontal: no issue -vertical: no issue  Standing balance -eyes closed x 30 sec min sway -eyes closed head turns 3x min sway -  Functional Gait Assessment  18/30  Forward-T at counter 1x5 reps left/right; visual target on ground and wall for position changes      PATIENT EDUCATION: Education details: assessment details, PT intervention, and HEP initiation Person educated: Patient Education method: Explanation Education comprehension: verbalized understanding  HOME EXERCISE PROGRAM: Access Code: [email or web address] URL: [email or web address] Date: 10/25/2022 Prepared by: Shary Decamp  Access Code: [email or web address] URL: [email or web address] Date: 11/05/2022 Prepared by: Shary Decamp  Exercises - Standing Gaze Stabilization with Head Rotation  - 1 x daily - 7 x weekly - 3 sets - 10 reps - Standing Gaze Stabilization with Head Nod  - 1 x daily - 7 x weekly - 3 sets - 10 reps - Standing VOR Cancellation  - 1 x daily - 7 x weekly - 3 sets - 10 reps - Corner Balance Feet Together With Eyes Open  - 1 x daily - 7 x weekly - 3 sets - 30 sec hold - Corner Balance Feet Together With Eyes Closed  - 1 x daily - 7 x weekly - 3 sets - 30 sec hold - Corner Balance Feet Together: Eyes Open With Head Turns  - 1 x daily - 7 x weekly - 3 sets - 3 reps - Corner Balance Feet Together: Eyes Closed With Head Turns  - 1 x daily - 7 x weekly - 3 sets - 3 reps - Forward T with Counter Support  - 1 x daily - 7 x weekly - 1-2 sets - 5 reps  ____________________________________________________________________________________________________________________ (Measures in this section from initial evaluation unless otherwise noted) SENSATION: NT, pt reports diabetic neuropathy affecting dorsal feet  EDEMA:  BLE lower leg >  POSTURE:  No Significant postural limitations  Cervical ROM:   Extension/flexion guarded due to symptoms  GAIT: Gait pattern: WFL Distance walked:  Assistive device utilized: None Level of assistance: Complete Independence  VESTIBULAR ASSESSMENT:  GENERAL OBSERVATION: wears bifocals   SYMPTOM BEHAVIOR:  Subjective  history: sudden onset dizziness in May, present ever since  Non-Vestibular symptoms:  none of note  Type of dizziness: Imbalance (Disequilibrium) and Spinning/Vertigo  Frequency: everyday  Duration: much of the day and increased with certain positions  Aggravating factors: Induced by position change: supine to sit and sit to stand, Induced by motion: looking up at the ceiling, bending down to the ground, and turning head quickly, Worse outside or in busy environment, and Moving eyes  Relieving factors: rest, slow movements, and avoid busy/distracting environments  Progression of symptoms: unchanged  OCULOMOTOR EXAM:  Ocular Alignment: normal  Ocular ROM: No Limitations  Spontaneous Nystagmus: absent  Gaze-Induced Nystagmus: absent  Smooth Pursuits: intact, symptomtaic  Saccades: intact, reports 3/10 symptoms horizontal, 5/10 vertical  Convergence/Divergence: 6 cm   VESTIBULAR - OCULAR REFLEX:   Slow VOR: Comment: slower speed, reports blurriness  VOR Cancellation: Normal  Head-Impulse Test: HIT Right: negative HIT Left: negative  Dynamic Visual Acuity: Not able to be assessed TBD   POSITIONAL TESTING: Right Dix-Hallpike: no nystagmus Left Dix-Hallpike: upbeating, left nystagmus Right Roll Test: no nystagmus Left Roll Test: no nystagmus  FUNCTIONAL GAIT: Functional gait assessment: TBD  M-CTSIB  Condition 1: Firm Surface, EO 30 Sec, Normal and Mild Sway  Condition 2: Firm Surface, EC 30 Sec, Mild Sway  Condition 3: Foam Surface, EO 30 Sec, Moderate Sway  Condition 4: Foam Surface, EC 9 Sec, Moderate and Severe Sway   GOALS: Goals reviewed with patient? Yes  SHORT TERM GOALS: Target date: same as LTG  LONG TERM GOALS: Target date: 11/22/2022   Pt to report absence of positional dizziness  Baseline: +left Dix-Hallpike Goal status: INITIAL  2.  Pt to report absence of symptoms with standing VOR x 1 x 30 sec Baseline: 5 reps Goal status: INITIAL  3.  Demo improved  postural stability per mild sway x30 sec condition 4 M-CTSIB to improve postural stability and safety with ADL Baseline:  Goal status: INITIAL  ASSESSMENT:  CLINICAL IMPRESSION: No positional dizziness/vertigo experienced today in testing conditions.  Notable for motion sensitivity with large amplitude movements and quick turns of head. Deficits obvious during Functional Gait Assessment with score of 18/30 indicating increased risk for falls.  Instituted additional habituation activities for forward bending to address hypofunction. Patient will likely need to extend POC to continue to address deficits and limitations.   OBJECTIVE IMPAIRMENTS: decreased activity tolerance, decreased balance, and dizziness.   PLAN:  PT FREQUENCY: 1-2x/week  PT DURATION: 4 weeks  PLANNED INTERVENTIONS: Therapeutic exercises, Therapeutic activity, Neuromuscular re-education, Balance training, Gait training, Patient/Family education, Self Care, Joint mobilization, Stair training, Vestibular training, Canalith repositioning, DME instructions, Aquatic Therapy, Dry Needling, Spinal mobilization, Cryotherapy, Moist heat, Taping, Ionotophoresis 4mg /ml Dexamethasone, and Manual therapy  PLAN FOR NEXT SESSION: re-assess positional dizziness, HEP review, plan on recertification to extend POC   8:49 AM, 11/12/22 M. Shary Decamp, PT, DPT Physical Therapist- [place] Office Number: [phone]

## 2022-11-15 ENCOUNTER — Ambulatory Visit: Payer: Medicare Other

## 2022-11-19 ENCOUNTER — Ambulatory Visit: Payer: Medicare Other | Attending: Adult Health

## 2022-11-19 DIAGNOSIS — R2681 Unsteadiness on feet: Secondary | ICD-10-CM | POA: Insufficient documentation

## 2022-11-19 DIAGNOSIS — R42 Dizziness and giddiness: Secondary | ICD-10-CM | POA: Insufficient documentation

## 2022-11-19 NOTE — Therapy (Signed)
OUTPATIENT PHYSICAL THERAPY VESTIBULAR TREATMENT   Patient Name: [name] MRN: [id number] DOB:[birth year], 72 y.o., female Today's Date: 11/19/2022  END OF SESSION:  PT End of Session - 11/19/22 0801     Visit Number 7    Number of Visits 8    Date for PT Re-Evaluation 11/22/22    Authorization Type Medicare/MCD    PT Start Time 0800    PT Stop Time 0845    PT Time Calculation (min) 45 min    Activity Tolerance Patient tolerated treatment well    Behavior During Therapy [facility] for tasks assessed/performed            Past Medical History:  Diagnosis Date   Allergy    Anemia    Anxiety    ANXIETY DEPRESSION 09/15/2007   Arthritis    ASTHMA 05/15/2009   Asthma    DIABETES MELLITUS, TYPE II 12/11/2006   DIVERTICULOSIS, COLON 12/11/2006   Edema 01/15/2009   Gout    pt denies; toe was broken   Headache(784.0) 12/11/2006   Hyperlipidemia    Hypertension    HYPERTENSION NEC 08/14/2007   HYPOTHYROIDISM 12/11/2006   PELVIC PAIN, CHRONIC 08/14/2007   Restless leg syndrome    Past Surgical History:  Procedure Laterality Date   ABDOMINAL HYSTERECTOMY     APPENDECTOMY     Bladder Suspension     COLONOSCOPY  10/08/2017   Dr. Marina Goodell   LEFT HEART CATH AND CORONARY ANGIOGRAPHY N/A 09/05/2021   Procedure: LEFT HEART CATH AND CORONARY ANGIOGRAPHY;  Surgeon: Lennette Bihari, MD;  Location: [facility];  Service: Cardiovascular;  Laterality: N/A;   Patient Active Problem List   Diagnosis Date Noted   Chest pain of uncertain etiology    Fracture of second toe, left, closed, initial encounter 09/26/2017   GERD (gastroesophageal reflux disease) 10/19/2014   Essential hypertension 08/16/2014   Breast pain, right 05/20/2014   Restless leg syndrome 05/20/2014   Hip pain, bilateral 10/23/2011   Knee pain, bilateral 10/23/2011   EDEMA 01/15/2009   Hypothyroidism 12/11/2006   Diabetes type 2, uncontrolled 12/11/2006   DIVERTICULOSIS, COLON 12/11/2006    HEADACHE 12/11/2006    PCP: Shirline Frees, NP REFERRING PROVIDER: Shirline Frees, NP  REFERRING DIAG:  H81.13 (ICD-10-CM) - Benign paroxysmal positional vertigo due to bilateral vestibular disorder    THERAPY DIAG:  Dizziness and giddiness  Unsteadiness on feet  ONSET DATE: May 2024  Rationale for Evaluation and Treatment: Rehabilitation  SUBJECTIVE:   SUBJECTIVE STATEMENT: Very rough emotional week, daughter in hospital. Not able to do any exercise. Not really dizzy, just not right. Pt accompanied by: self  PERTINENT HISTORY: past medical history of Allergy, Anemia, Anxiety, ANXIETY DEPRESSION (09/15/2007), Arthritis, ASTHMA (05/15/2009), Asthma, DIABETES MELLITUS, TYPE II (12/11/2006), DIVERTICULOSIS, COLON (12/11/2006), Edema (01/15/2009), Gout, Headache(784.0) (12/11/2006), Hyperlipidemia, Hypertension, HYPERTENSION NEC (08/14/2007), HYPOTHYROIDISM (12/11/2006), PELVIC PAIN, CHRONIC (08/14/2007), and Restless leg syndrome.  PAIN:  Are you having pain?  Reports chronic LBP but no worse  PRECAUTIONS: None  WEIGHT BEARING RESTRICTIONS: No  FALLS: Has patient fallen in last 6 months? Yes. Number of falls 2 falling forward since onset of dizziness  LIVING ENVIRONMENT: Lives with: lives alone Lives in: House/apartment Stairs: No Has following equipment at home:  has cane  PLOF: Independent  PATIENT GOALS: Get rid of dizziness  OBJECTIVE:   TODAY'S TREATMENT: 11/19/22 Activity Comments  Left/right roll test -negative left -possibly single beat with right roll  Left Dix-Hallpike -burst of left upbeating nystagmus  with lowering to position followed by random small amplitude bursts of left upbeating nystagmus while in position--dizziness upon returning to upright  Right Dix-Hallpike No nystagmus/dizziness. Motion sensitivity with arising to long-sitting  Left Dix-Hallpike Left upbeating, indefatigable, small-amplitude   Left Semont Maneuver   Corner balance activities  X 8 min (keeping head level)  Left Dix-Hallpike  Left upbeating in position 1. Left apogeotropic in position 2?  Left Epley Very pronounced left horizontal nystagmus  Left roll test -no nystagmus  Left BBQ -once right sidelying, left apogeotropic BBQ roll to point of prone on elbows        PATIENT EDUCATION: Education details: assessment details, PT intervention, and HEP initiation Person educated: Patient Education method: Explanation Education comprehension: verbalized understanding  HOME EXERCISE PROGRAM: Access Code: [email or web address] URL: [email or web address] Date: 10/25/2022 Prepared by: Shary Decamp  Access Code: [email or web address] URL: [email or web address] Date: 11/05/2022 Prepared by: Shary Decamp  Exercises - Standing Gaze Stabilization with Head Rotation  - 1 x daily - 7 x weekly - 3 sets - 10 reps - Standing Gaze Stabilization with Head Nod  - 1 x daily - 7 x weekly - 3 sets - 10 reps - Standing VOR Cancellation  - 1 x daily - 7 x weekly - 3 sets - 10 reps - Corner Balance Feet Together With Eyes Open  - 1 x daily - 7 x weekly - 3 sets - 30 sec hold - Corner Balance Feet Together With Eyes Closed  - 1 x daily - 7 x weekly - 3 sets - 30 sec hold - Corner Balance Feet Together: Eyes Open With Head Turns  - 1 x daily - 7 x weekly - 3 sets - 3 reps - Corner Balance Feet Together: Eyes Closed With Head Turns  - 1 x daily - 7 x weekly - 3 sets - 3 reps - Forward T with Counter Support  - 1 x daily - 7 x weekly - 1-2 sets - 5 reps  ____________________________________________________________________________________________________________________ (Measures in this section from initial evaluation unless otherwise noted) SENSATION: NT, pt reports diabetic neuropathy affecting dorsal feet  EDEMA:  BLE lower leg >  POSTURE:  No Significant postural limitations  Cervical ROM:   Extension/flexion guarded due to symptoms  GAIT: Gait pattern: WFL Distance  walked:  Assistive device utilized: None Level of assistance: Complete Independence  VESTIBULAR ASSESSMENT:  GENERAL OBSERVATION: wears bifocals   SYMPTOM BEHAVIOR:  Subjective history: sudden onset dizziness in May, present ever since  Non-Vestibular symptoms:  none of note  Type of dizziness: Imbalance (Disequilibrium) and Spinning/Vertigo  Frequency: everyday  Duration: much of the day and increased with certain positions  Aggravating factors: Induced by position change: supine to sit and sit to stand, Induced by motion: looking up at the ceiling, bending down to the ground, and turning head quickly, Worse outside or in busy environment, and Moving eyes  Relieving factors: rest, slow movements, and avoid busy/distracting environments  Progression of symptoms: unchanged  OCULOMOTOR EXAM:  Ocular Alignment: normal  Ocular ROM: No Limitations  Spontaneous Nystagmus: absent  Gaze-Induced Nystagmus: absent  Smooth Pursuits: intact, symptomtaic  Saccades: intact, reports 3/10 symptoms horizontal, 5/10 vertical  Convergence/Divergence: 6 cm   VESTIBULAR - OCULAR REFLEX:   Slow VOR: Comment: slower speed, reports blurriness  VOR Cancellation: Normal  Head-Impulse Test: HIT Right: negative HIT Left: negative  Dynamic Visual Acuity: Not able to be assessed TBD   POSITIONAL TESTING: Right Dix-Hallpike: no nystagmus Left Dix-Hallpike: upbeating, left nystagmus Right Roll  Test: no nystagmus Left Roll Test: no nystagmus  FUNCTIONAL GAIT: Functional gait assessment: TBD  M-CTSIB  Condition 1: Firm Surface, EO 30 Sec, Normal and Mild Sway  Condition 2: Firm Surface, EC 30 Sec, Mild Sway  Condition 3: Foam Surface, EO 30 Sec, Moderate Sway  Condition 4: Foam Surface, EC 9 Sec, Moderate and Severe Sway   GOALS: Goals reviewed with patient? Yes  SHORT TERM GOALS: Target date: same as LTG  LONG TERM GOALS: Target date: 11/22/2022   Pt to report absence of positional dizziness   Baseline: +left Dix-Hallpike Goal status: INITIAL  2.  Pt to report absence of symptoms with standing VOR x 1 x 30 sec Baseline: 5 reps Goal status: INITIAL  3.  Demo improved postural stability per mild sway x30 sec condition 4 M-CTSIB to improve postural stability and safety with ADL Baseline:  Goal status: INITIAL  ASSESSMENT:  CLINICAL IMPRESSION: Initially positional dizziness evident with left Dix-Hallpike with small amplitude upbeating nystagmus with indefatigable nature. Use of Semont maneuver to liberate and unfortunatly with repeat testing reveals left geotropic nystagmus and required to sit upright due to feeling of nausea, then with repeat testing left apogeotropic nystagmus reveals itself.  I am not optimistic that the f/u canalith maneuver did much to alleviate.  Recommend left sidelying for 1-2 hr at home to see if this will help alleviate until next session  OBJECTIVE IMPAIRMENTS: decreased activity tolerance, decreased balance, and dizziness.   PLAN:  PT FREQUENCY: 1-2x/week  PT DURATION: 4 weeks  PLANNED INTERVENTIONS: Therapeutic exercises, Therapeutic activity, Neuromuscular re-education, Balance training, Gait training, Patient/Family education, Self Care, Joint mobilization, Stair training, Vestibular training, Canalith repositioning, DME instructions, Aquatic Therapy, Dry Needling, Spinal mobilization, Cryotherapy, Moist heat, Taping, Ionotophoresis 4mg /ml Dexamethasone, and Manual therapy  PLAN FOR NEXT SESSION: re-assess positional dizziness, will likely need Kim maneuver again. Need recertification   8:01 AM, 11/19/22 M. Shary Decamp, PT, DPT Physical Therapist- [place] Office Number: [phone]

## 2022-11-20 ENCOUNTER — Ambulatory Visit
Admission: EM | Admit: 2022-11-20 | Discharge: 2022-11-20 | Disposition: A | Discharge status: Home / Self Care | Payer: Medicare Other | Attending: Family Medicine | Admitting: Family Medicine

## 2022-11-20 DIAGNOSIS — R110 Nausea: Secondary | ICD-10-CM | POA: Diagnosis not present

## 2022-11-20 DIAGNOSIS — R42 Dizziness and giddiness: Secondary | ICD-10-CM | POA: Diagnosis not present

## 2022-11-20 MED ORDER — MECLIZINE HCL 25 MG PO TABS: 25.0000 mg | ORAL_TABLET | Freq: Three times a day (TID) | ORAL | 0 refills | Status: DC | PRN

## 2022-11-20 MED ORDER — ONDANSETRON 8 MG PO TBDP: 8.0000 mg | ORAL_TABLET | Freq: Three times a day (TID) | ORAL | 0 refills | Status: DC | PRN

## 2022-11-20 MED ORDER — ONDANSETRON 8 MG PO TBDP
8.0000 mg | ORAL_TABLET | Freq: Once | ORAL | Status: AC
  Administered 2022-11-20: 8 mg via ORAL

## 2022-11-20 NOTE — ED Provider Notes (Signed)
[facility]    CSN: [id number] Arrival date & time: 11/20/22  1158      History   Chief Complaint Chief Complaint  Patient presents with   Dizziness    HPI [name] is a 72 y.o. female.   HPI 72 year old female presents with dizziness and nausea since May.  Patient reports working with physical therapy has helped a great deal with her vertigo.  Reports she only has 4 pills of meclizine left and is currently out of Zofran and request refills of these medications.  PMH significant for T2DM, hypothyroidism, HTN, and obesity.  Patient is accompanied by her daughter this afternoon.  Past Medical History:  Diagnosis Date   Allergy    Anemia    Anxiety    ANXIETY DEPRESSION 09/15/2007   Arthritis    ASTHMA 05/15/2009   Asthma    DIABETES MELLITUS, TYPE II 12/11/2006   DIVERTICULOSIS, COLON 12/11/2006   Edema 01/15/2009   Gout    pt denies; toe was broken   Headache(784.0) 12/11/2006   Hyperlipidemia    Hypertension    HYPERTENSION NEC 08/14/2007   HYPOTHYROIDISM 12/11/2006   PELVIC PAIN, CHRONIC 08/14/2007   Restless leg syndrome     Patient Active Problem List   Diagnosis Date Noted   Chest pain of uncertain etiology    Fracture of second toe, left, closed, initial encounter 09/26/2017   GERD (gastroesophageal reflux disease) 10/19/2014   Essential hypertension 08/16/2014   Breast pain, right 05/20/2014   Restless leg syndrome 05/20/2014   Hip pain, bilateral 10/23/2011   Knee pain, bilateral 10/23/2011   EDEMA 01/15/2009   Hypothyroidism 12/11/2006   Diabetes type 2, uncontrolled 12/11/2006   DIVERTICULOSIS, COLON 12/11/2006   HEADACHE 12/11/2006    Past Surgical History:  Procedure Laterality Date   ABDOMINAL HYSTERECTOMY     APPENDECTOMY     Bladder Suspension     COLONOSCOPY  10/08/2017   Dr. Marina Goodell   LEFT HEART CATH AND CORONARY ANGIOGRAPHY N/A 09/05/2021   Procedure: LEFT HEART CATH AND CORONARY ANGIOGRAPHY;  Surgeon:  Lennette Bihari, MD;  Location: [facility];  Service: Cardiovascular;  Laterality: N/A;    OB History     Gravida  1   Para      Term      Preterm      AB      Living  1      SAB      IAB      Ectopic      Multiple      Live Births  1            Home Medications    Prior to Admission medications   Medication Sig Start Date End Date Taking? Authorizing Provider  meclizine (ANTIVERT) 25 MG tablet Take 1 tablet (25 mg total) by mouth 3 (three) times daily as needed for dizziness. 11/20/22  Yes Trevor Iha, FNP  ondansetron (ZOFRAN-ODT) 8 MG disintegrating tablet Take 1 tablet (8 mg total) by mouth every 8 (eight) hours as needed for nausea or vomiting. 11/20/22  Yes Trevor Iha, FNP  acetaminophen (TYLENOL) 500 MG tablet Take 1,000 mg by mouth every 8 (eight) hours as needed for moderate pain.    [provider]  albuterol (VENTOLIN HFA) 108 (90 Base) MCG/ACT inhaler Inhale 2 puffs into the lungs every 6 (six) hours as needed for wheezing or shortness of breath. 11/02/22   [place], Betty G, MD  ALPRAZolam (  XANAX) 0.25 MG tablet Take 1 tablet (0.25 mg total) by mouth 2 (two) times daily as needed for anxiety. 08/22/21   Nafziger, Kandee Keen, NP  amLODipine (NORVASC) 5 MG tablet TAKE 1 TABLET(5 MG) BY MOUTH DAILY 09/24/22   Parke Poisson, MD  aspirin 81 MG tablet Take 81 mg by mouth at bedtime.    [provider]  atorvastatin (LIPITOR) 10 MG tablet TAKE 1 TABLET(10 MG) BY MOUTH DAILY 05/11/22   Nafziger, Kandee Keen, NP  betamethasone valerate ointment (VALISONE) 0.1 % Apply 1 Application topically 2 (two) times daily. Place in the affected area twice a bid for 2 weeks and then place on the area at night twice weekly. 07/02/22   Patton Salles, MD  blood glucose meter kit and supplies KIT Dispense based on patient and insurance preference. Use up to four times daily as directed. 02/23/21   Nafziger, Kandee Keen, NP  Carboxymethylcellul-Glycerin  (LUBRICATING EYE DROPS OP) Place 1 drop into both eyes daily as needed (dry eyes).    [provider]  cholecalciferol (VITAMIN D3) 25 MCG (1000 UT) tablet Take 1,000 Units by mouth daily.    [provider]  citalopram (CELEXA) 20 MG tablet TAKE 1 TABLET(20 MG) BY MOUTH DAILY 05/11/22   Nafziger, Kandee Keen, NP  Emollient (GOLD BOND DIABETICS DRY SKIN) CREA Apply 1 application. topically at bedtime.    [provider]  furosemide (LASIX) 20 MG tablet Take one tab daily x 3 days as needed for swelling in legs 05/11/22   Nafziger, Kandee Keen, NP  glucose blood test strip Use to check blood glucose TID 12/05/20   Shirline Frees, NP  Lancets 28G MISC Use to check blood glucose 4 times daily 02/23/21   Nafziger, Kandee Keen, NP  levothyroxine (SYNTHROID) 88 MCG tablet TAKE 1 TABLET(88 MCG) BY MOUTH DAILY 10/19/22   Nafziger, Kandee Keen, NP  Lidocaine 4 % AERO Apply 1 spray topically at bedtime as needed (foot pain).    [provider]  losartan-hydrochlorothiazide (HYZAAR) 100-25 MG tablet TAKE 1 TABLET BY MOUTH DAILY 06/29/22   Nafziger, Kandee Keen, NP  metFORMIN (GLUCOPHAGE) 500 MG tablet TAKE 1 TABLET(500 MG) BY MOUTH TWICE DAILY WITH A MEAL 10/02/22   Nafziger, Kandee Keen, NP  nitroGLYCERIN (NITROSTAT) 0.4 MG SL tablet Place 1 tablet (0.4 mg total) under the tongue every 5 (five) minutes as needed for chest pain. 09/01/21 08/21/22  Parke Poisson, MD  pramipexole (MIRAPEX) 1.5 MG tablet TAKE 1 TABLET(1.5 MG) BY MOUTH AT BEDTIME 07/25/22   Nafziger, Kandee Keen, NP  Prenatal Vit-Fe Fumarate-FA (PRENATAL VITAMIN PO) Take 1 tablet by mouth daily.    [provider]  vitamin B-12 (CYANOCOBALAMIN) 1000 MCG tablet Take 1,000 mcg by mouth daily.    [provider]    Family History Family History  Problem Relation Age of Onset   COPD Mother        smoker   Diabetes Mother    COPD Father        smoker   Diabetes Father    Diabetes Sister    Diabetes Mellitus II Sister    Diabetes Sister     Diabetes Sister    Diabetes Sister    Lung cancer Nephew 60   Breast cancer Other 87   Colon cancer Neg Hx    Esophageal cancer Neg Hx    Pancreatic cancer Neg Hx    Rectal cancer Neg Hx    Stomach cancer Neg Hx     Social History Social  History   Tobacco Use   Smoking status: Never   Smokeless tobacco: Never  Vaping Use   Vaping status: Never Used  Substance Use Topics   Alcohol use: Yes    Comment: wine occ   Drug use: No     Allergies   Benadryl [diphenhydramine hcl], Ace inhibitors, Amoxicillin, Aspirin, Codeine, Fish allergy, Hydrocodone, Indomethacin, Pentazocine lactate, Promethazine hcl, Quinine, and Chlorphen-phenyleph-methscop   Review of Systems Review of Systems  Gastrointestinal:  Positive for nausea.  Neurological:  Positive for dizziness.     Physical Exam Triage Vital Signs ED Triage Vitals [11/20/22 1209]  Encounter Vitals Group     BP      Systolic BP Percentile      Diastolic BP Percentile      Pulse      Resp      Temp      Temp src      SpO2      Weight      Height      Head Circumference      Peak Flow      Pain Score 0     Pain Loc      Pain Education      Exclude from Growth Chart    No data found.  Updated Vital Signs BP (!) 144/76   Pulse 75   Temp 97.6 F (36.4 C)   Resp 16   SpO2 98%    Physical Exam Vitals and nursing note reviewed.  Constitutional:      Appearance: Normal appearance. She is normal weight.  HENT:     Head: Normocephalic and atraumatic.     Right Ear: Tympanic membrane, ear canal and external ear normal.     Left Ear: Tympanic membrane, ear canal and external ear normal.     Nose: Nose normal.     Mouth/Throat:     Mouth: Mucous membranes are moist.     Pharynx: Oropharynx is clear.  Eyes:     Extraocular Movements: Extraocular movements intact.     Conjunctiva/sclera: Conjunctivae normal.     Pupils: Pupils are equal, round, and reactive to light.  Cardiovascular:     Rate and Rhythm:  Normal rate and regular rhythm.     Pulses: Normal pulses.     Heart sounds: Normal heart sounds.  Pulmonary:     Effort: Pulmonary effort is normal.     Breath sounds: Normal breath sounds.  Musculoskeletal:        General: Normal range of motion.     Cervical back: Normal range of motion and neck supple.  Skin:    General: Skin is warm and dry.  Neurological:     General: No focal deficit present.     Mental Status: She is alert and oriented to person, place, and time. Mental status is at baseline.  Psychiatric:        Mood and Affect: Mood normal.        Behavior: Behavior normal.      UC Treatments / Results  Labs (all labs ordered are listed, but only abnormal results are displayed) Labs Reviewed - No data to display  EKG   Radiology No results found.  Procedures Procedures (including critical care time)  Medications Ordered in UC Medications  ondansetron (ZOFRAN-ODT) disintegrating tablet 8 mg (8 mg Oral Given 11/20/22 1236)    Initial Impression / Assessment and Plan / UC Course  I have reviewed the triage vital signs and the  nursing notes.  Pertinent labs & imaging results that were available during my care of the patient were reviewed by me and considered in my medical decision making (see chart for details).     MDM: 1.  Vertigo-Rx'd meclizine 25 mg tablet: Take 1 tablet by mouth 3 times daily as needed for dizziness; 2.  Nausea-Rx'd Zofran ODT 8 mg disintegrating tablet: Take 1 tablet by mouth every 8 hours as needed for nausea. Advised patient to take medication as directed.  Advised may take Zofran daily or as needed for nausea.  Encouraged increase daily water intake to 64 ounces per day while taking these medications.  Advised if symptoms worsen and/or unresolved please follow-up with your PCP or here for further evaluation.   Final Clinical Impressions(s) / UC Diagnoses   Final diagnoses:  Vertigo  Nausea     Discharge Instructions       Advised patient to take medication as directed.  Advised may take Zofran daily or as needed for nausea.  Encouraged increase daily water intake to 64 ounces per day while taking these medications.  Advised if symptoms worsen and/or unresolved please follow-up with your PCP or here for further evaluation.     ED Prescriptions     Medication Sig Dispense Auth. Provider   meclizine (ANTIVERT) 25 MG tablet Take 1 tablet (25 mg total) by mouth 3 (three) times daily as needed for dizziness. 30 tablet Trevor Iha, FNP   ondansetron (ZOFRAN-ODT) 8 MG disintegrating tablet Take 1 tablet (8 mg total) by mouth every 8 (eight) hours as needed for nausea or vomiting. 24 tablet Trevor Iha, FNP      PDMP not reviewed this encounter.   Trevor Iha, FNP 11/20/22 1240

## 2022-11-20 NOTE — Discharge Instructions (Addendum)
Advised patient to take medication as directed.  Advised may take Zofran daily or as needed for nausea.  Encouraged increase daily water intake to 64 ounces per day while taking these medications.  Advised if symptoms worsen and/or unresolved please follow-up with your PCP or here for further evaluation.

## 2022-11-20 NOTE — Therapy (Signed)
OUTPATIENT PHYSICAL THERAPY VESTIBULAR TREATMENT   Patient Name: [name] MRN: [id number] DOB:Aug 07, 1950, 72 y.o., female Today's Date: 11/20/2022  END OF SESSION:   Past Medical History:  Diagnosis Date   Allergy    Anemia    Anxiety    ANXIETY DEPRESSION 09/15/2007   Arthritis    ASTHMA 05/15/2009   Asthma    DIABETES MELLITUS, TYPE II 12/11/2006   DIVERTICULOSIS, COLON 12/11/2006   Edema 01/15/2009   Gout    pt denies; toe was broken   Headache(784.0) 12/11/2006   Hyperlipidemia    Hypertension    HYPERTENSION NEC 08/14/2007   HYPOTHYROIDISM 12/11/2006   PELVIC PAIN, CHRONIC 08/14/2007   Restless leg syndrome    Past Surgical History:  Procedure Laterality Date   ABDOMINAL HYSTERECTOMY     APPENDECTOMY     Bladder Suspension     COLONOSCOPY  10/08/2017   Dr. Marina Goodell   LEFT HEART CATH AND CORONARY ANGIOGRAPHY N/A 09/05/2021   Procedure: LEFT HEART CATH AND CORONARY ANGIOGRAPHY;  Surgeon: Lennette Bihari, MD;  Location: [facility];  Service: Cardiovascular;  Laterality: N/A;   Patient Active Problem List   Diagnosis Date Noted   Chest pain of uncertain etiology    Fracture of second toe, left, closed, initial encounter 09/26/2017   GERD (gastroesophageal reflux disease) 10/19/2014   Essential hypertension 08/16/2014   Breast pain, right 05/20/2014   Restless leg syndrome 05/20/2014   Hip pain, bilateral 10/23/2011   Knee pain, bilateral 10/23/2011   EDEMA 01/15/2009   Hypothyroidism 12/11/2006   Diabetes type 2, uncontrolled 12/11/2006   DIVERTICULOSIS, COLON 12/11/2006   HEADACHE 12/11/2006    PCP: Shirline Frees, NP REFERRING PROVIDER: Shirline Frees, NP  REFERRING DIAG:  H81.13 (ICD-10-CM) - Benign paroxysmal positional vertigo due to bilateral vestibular disorder    THERAPY DIAG:  No diagnosis found.  ONSET DATE: May 2024  Rationale for Evaluation and Treatment: Rehabilitation  SUBJECTIVE:   SUBJECTIVE  STATEMENT: Very rough emotional week, daughter in hospital. Not able to do any exercise. Not really dizzy, just not right. Pt accompanied by: self  PERTINENT HISTORY: past medical history of Allergy, Anemia, Anxiety, ANXIETY DEPRESSION (09/15/2007), Arthritis, ASTHMA (05/15/2009), Asthma, DIABETES MELLITUS, TYPE II (12/11/2006), DIVERTICULOSIS, COLON (12/11/2006), Edema (01/15/2009), Gout, Headache(784.0) (12/11/2006), Hyperlipidemia, Hypertension, HYPERTENSION NEC (08/14/2007), HYPOTHYROIDISM (12/11/2006), PELVIC PAIN, CHRONIC (08/14/2007), and Restless leg syndrome.  PAIN:  Are you having pain?  Reports chronic LBP but no worse  PRECAUTIONS: None  WEIGHT BEARING RESTRICTIONS: No  FALLS: Has patient fallen in last 6 months? Yes. Number of falls 2 falling forward since onset of dizziness  LIVING ENVIRONMENT: Lives with: lives alone Lives in: House/apartment Stairs: No Has following equipment at home:  has cane  PLOF: Independent  PATIENT GOALS: Get rid of dizziness  OBJECTIVE:     TODAY'S TREATMENT: 11/22/22 Activity Comments                        HOME EXERCISE PROGRAM: Access Code: [email or web address] URL: [email or web address] Date: 10/25/2022 Prepared by: Shary Decamp  Access Code: [email or web address] URL: [email or web address] Date: 11/05/2022 Prepared by: Shary Decamp  Exercises - Standing Gaze Stabilization with Head Rotation  - 1 x daily - 7 x weekly - 3 sets - 10 reps - Standing Gaze Stabilization with Head Nod  - 1 x daily - 7 x weekly - 3 sets - 10 reps - Standing VOR Cancellation  - 1 x daily - 7 x weekly -  3 sets - 10 reps - Corner Balance Feet Together With Eyes Open  - 1 x daily - 7 x weekly - 3 sets - 30 sec hold - Corner Balance Feet Together With Eyes Closed  - 1 x daily - 7 x weekly - 3 sets - 30 sec hold - Corner Balance Feet Together: Eyes Open With Head Turns  - 1 x daily - 7 x weekly - 3 sets - 3 reps - Corner Balance Feet  Together: Eyes Closed With Head Turns  - 1 x daily - 7 x weekly - 3 sets - 3 reps - Forward T with Counter Support  - 1 x daily - 7 x weekly - 1-2 sets - 5 reps  ____________________________________________________________________________________________________________________ (Measures in this section from initial evaluation unless otherwise noted) SENSATION: NT, pt reports diabetic neuropathy affecting dorsal feet  EDEMA:  BLE lower leg >  POSTURE:  No Significant postural limitations  Cervical ROM:   Extension/flexion guarded due to symptoms  GAIT: Gait pattern: WFL Distance walked:  Assistive device utilized: None Level of assistance: Complete Independence  VESTIBULAR ASSESSMENT:  GENERAL OBSERVATION: wears bifocals   SYMPTOM BEHAVIOR:  Subjective history: sudden onset dizziness in May, present ever since  Non-Vestibular symptoms:  none of note  Type of dizziness: Imbalance (Disequilibrium) and Spinning/Vertigo  Frequency: everyday  Duration: much of the day and increased with certain positions  Aggravating factors: Induced by position change: supine to sit and sit to stand, Induced by motion: looking up at the ceiling, bending down to the ground, and turning head quickly, Worse outside or in busy environment, and Moving eyes  Relieving factors: rest, slow movements, and avoid busy/distracting environments  Progression of symptoms: unchanged  OCULOMOTOR EXAM:  Ocular Alignment: normal  Ocular ROM: No Limitations  Spontaneous Nystagmus: absent  Gaze-Induced Nystagmus: absent  Smooth Pursuits: intact, symptomtaic  Saccades: intact, reports 3/10 symptoms horizontal, 5/10 vertical  Convergence/Divergence: 6 cm   VESTIBULAR - OCULAR REFLEX:   Slow VOR: Comment: slower speed, reports blurriness  VOR Cancellation: Normal  Head-Impulse Test: HIT Right: negative HIT Left: negative  Dynamic Visual Acuity: Not able to be assessed TBD   POSITIONAL TESTING: Right  Dix-Hallpike: no nystagmus Left Dix-Hallpike: upbeating, left nystagmus Right Roll Test: no nystagmus Left Roll Test: no nystagmus  FUNCTIONAL GAIT: Functional gait assessment: TBD  M-CTSIB  Condition 1: Firm Surface, EO 30 Sec, Normal and Mild Sway  Condition 2: Firm Surface, EC 30 Sec, Mild Sway  Condition 3: Foam Surface, EO 30 Sec, Moderate Sway  Condition 4: Foam Surface, EC 9 Sec, Moderate and Severe Sway   GOALS: Goals reviewed with patient? Yes  SHORT TERM GOALS: Target date: same as LTG  LONG TERM GOALS: Target date: 11/22/2022   Pt to report absence of positional dizziness  Baseline: +left Dix-Hallpike Goal status: IN PROGRESS  2.  Pt to report absence of symptoms with standing VOR x 1 x 30 sec Baseline: 5 reps Goal status: IN PROGRESS  3.  Demo improved postural stability per mild sway x30 sec condition 4 M-CTSIB to improve postural stability and safety with ADL Baseline:  Goal status: IN PROGRESS  ASSESSMENT:  CLINICAL IMPRESSION: Initially positional dizziness evident with left Dix-Hallpike with small amplitude upbeating nystagmus with indefatigable nature. Use of Semont maneuver to liberate and unfortunatly with repeat testing reveals left geotropic nystagmus and required to sit upright due to feeling of nausea, then with repeat testing left apogeotropic nystagmus reveals itself.  I am not optimistic  that the f/u canalith maneuver did much to alleviate.  Recommend left sidelying for 1-2 hr at home to see if this will help alleviate until next session  OBJECTIVE IMPAIRMENTS: decreased activity tolerance, decreased balance, and dizziness.   PLAN:  PT FREQUENCY: 1-2x/week  PT DURATION: 4 weeks  PLANNED INTERVENTIONS: Therapeutic exercises, Therapeutic activity, Neuromuscular re-education, Balance training, Gait training, Patient/Family education, Self Care, Joint mobilization, Stair training, Vestibular training, Canalith repositioning, DME instructions, Aquatic  Therapy, Dry Needling, Spinal mobilization, Cryotherapy, Moist heat, Taping, Ionotophoresis 4mg /ml Dexamethasone, and Manual therapy  PLAN FOR NEXT SESSION: re-assess positional dizziness, will likely need Kim maneuver again. Need recertification

## 2022-11-20 NOTE — ED Triage Notes (Addendum)
[facility] since may. Pt has been seen in physical therapy and was feeling much better but symptoms returned last night worsening of the spinning sensation and nausea. Pt only has 4 pills of meclizine left. She is out zofran as well. She said these make her drowsy but it does help.

## 2022-11-22 ENCOUNTER — Encounter: Payer: Self-pay | Admitting: Physical Therapy

## 2022-11-22 ENCOUNTER — Encounter: Payer: Self-pay | Admitting: Adult Health

## 2022-11-22 ENCOUNTER — Ambulatory Visit (INDEPENDENT_AMBULATORY_CARE_PROVIDER_SITE_OTHER): Payer: Medicare Other | Admitting: Adult Health

## 2022-11-22 ENCOUNTER — Ambulatory Visit: Payer: Medicare Other | Admitting: Physical Therapy

## 2022-11-22 VITALS — BP 130/60 | HR 70 | Temp 98.2°F | Ht 63.5 in | Wt 180.0 lb

## 2022-11-22 DIAGNOSIS — R2681 Unsteadiness on feet: Secondary | ICD-10-CM | POA: Diagnosis not present

## 2022-11-22 DIAGNOSIS — I10 Essential (primary) hypertension: Secondary | ICD-10-CM | POA: Diagnosis not present

## 2022-11-22 DIAGNOSIS — R42 Dizziness and giddiness: Secondary | ICD-10-CM

## 2022-11-22 DIAGNOSIS — Z7984 Long term (current) use of oral hypoglycemic drugs: Secondary | ICD-10-CM

## 2022-11-22 DIAGNOSIS — E118 Type 2 diabetes mellitus with unspecified complications: Secondary | ICD-10-CM

## 2022-11-22 LAB — POCT GLYCOSYLATED HEMOGLOBIN (HGB A1C): Hemoglobin A1C: 7 % — AB (ref 4.0–5.6)

## 2022-11-22 MED ORDER — METFORMIN HCL 500 MG PO TABS
ORAL_TABLET | ORAL | 0 refills | Status: DC
Start: 2022-11-22 — End: 2023-02-27

## 2022-11-22 NOTE — Patient Instructions (Signed)
Health Maintenance Due  Topic Date Due   Zoster Vaccines- Shingrix (1 of 2) 06/21/1971   COVID-19 Vaccine (7 - 2023-24 season) 03/14/2022   DTaP/Tdap/Td (3 - Tdap) 03/20/2022   INFLUENZA VACCINE  11/15/2022   Medicare Annual Wellness (AWV)  01/05/2023       11/02/2022   10:54 AM 08/21/2022    8:28 AM 05/11/2022    9:21 AM  Depression screen PHQ 2/9  Decreased Interest 0 1 2  Down, Depressed, Hopeless 0 1 2  PHQ - 2 Score 0 2 4  Altered sleeping  2 3  Tired, decreased energy  1 2  Change in appetite  1 1  Feeling bad or failure about yourself   0 0  Trouble concentrating  1 2  Moving slowly or fidgety/restless  0 1  Suicidal thoughts  0 0  PHQ-9 Score  7 13  Difficult doing work/chores  Somewhat difficult Very difficult

## 2022-11-22 NOTE — Telephone Encounter (Signed)
Please advise 

## 2022-11-22 NOTE — Progress Notes (Signed)
Subjective:    Patient ID: [name], female    DOB: Feb 28, 1951, 72 y.o.   MRN: [id number]  HPI 72 year old female who  has a past medical history of Allergy, Anemia, Anxiety, ANXIETY DEPRESSION (09/15/2007), Arthritis, ASTHMA (05/15/2009), Asthma, DIABETES MELLITUS, TYPE II (12/11/2006), DIVERTICULOSIS, COLON (12/11/2006), Edema (01/15/2009), Gout, Headache(784.0) (12/11/2006), Hyperlipidemia, Hypertension, HYPERTENSION NEC (08/14/2007), HYPOTHYROIDISM (12/11/2006), PELVIC PAIN, CHRONIC (08/14/2007), and Restless leg syndrome.  DM -currently prescribed metformin 500 mg twice daily. She does not check blood sugar routinely Her last A1c was 6.8  Lab Results  Component Value Date   HGBA1C 7.0 (A) 11/22/2022   Wt Readings from Last 3 Encounters:  11/22/22 180 lb (81.6 kg)  11/02/22 181 lb 4 oz (82.2 kg)  09/25/22 178 lb (80.7 kg)   Essential hypertension-takes Hyzaar 100-25 mg daily.  She denies chest pain, shortness of breath, dizziness, lightheadedness, or headaches.  Blood pressures at home have been in the 120s to high 140s over 60s to 80s.  BP Readings from Last 3 Encounters:  11/22/22 130/60  11/20/22 (!) 144/76  11/02/22 126/70   Vertigo - has been going to Vestibular PT and has found this helpful " I have good days and bad days" but vertigo continues to be an issue. She was seen at [facility] two days ago for vertigo and had Meclizine and Zofran  refilled. She has  vestibular rehab after this visit.   Review of Systems See HPI   Past Medical History:  Diagnosis Date   Allergy    Anemia    Anxiety    ANXIETY DEPRESSION 09/15/2007   Arthritis    ASTHMA 05/15/2009   Asthma    DIABETES MELLITUS, TYPE II 12/11/2006   DIVERTICULOSIS, COLON 12/11/2006   Edema 01/15/2009   Gout    pt denies; toe was broken   Headache(784.0) 12/11/2006   Hyperlipidemia    Hypertension    HYPERTENSION NEC 08/14/2007   HYPOTHYROIDISM 12/11/2006   PELVIC PAIN, CHRONIC 08/14/2007    Restless leg syndrome     Social History   Socioeconomic History   Marital status: Widowed    Spouse name: Not on file   Number of children: Not on file   Years of education: Not on file   Highest education level: 12th grade  Occupational History   Not on file  Tobacco Use   Smoking status: Never   Smokeless tobacco: Never  Vaping Use   Vaping status: Never Used  Substance and Sexual Activity   Alcohol use: Yes    Comment: wine occ   Drug use: No   Sexual activity: Not Currently    Comment: hysterectomy, <5 sexual partners, <16 y/o, no STD  Other Topics Concern   Not on file  Social History Narrative   Not on file   Social Determinants of Health   Financial Resource Strain: Low Risk  (05/21/2022)   Overall Financial Resource Strain (CARDIA)    Difficulty of Paying Living Expenses: Not hard at all  Food Insecurity: Food Insecurity Present (05/21/2022)   Hunger Vital Sign    Worried About Running Out of Food in the Last Year: Sometimes true    Ran Out of Food in the Last Year: Sometimes true  Transportation Needs: No Transportation Needs (05/21/2022)   PRAPARE - Administrator, Civil Service (Medical): No    Lack of Transportation (Non-Medical): No  Physical Activity: Insufficiently Active (05/21/2022)   Exercise Vital Sign    Days of  Exercise per Week: 2 days    Minutes of Exercise per Session: 10 min  Stress: Stress Concern Present (05/21/2022)   [employer] of Occupational Health - Occupational Stress Questionnaire    Feeling of Stress : To some extent  Social Connections: Moderately Integrated (05/21/2022)   Social Connection and Isolation Panel [NHANES]    Frequency of Communication with Friends and Family: More than three times a week    Frequency of Social Gatherings with Friends and Family: More than three times a week    Attends Religious Services: More than 4 times per year    Active Member of [employer] or Organizations: Yes    Attends Tax inspector Meetings: More than 4 times per year    Marital Status: Widowed  Intimate Partner Violence: Not At Risk (01/04/2022)   Humiliation, Afraid, Rape, and Kick questionnaire    Fear of Current or Ex-Partner: No    Emotionally Abused: No    Physically Abused: No    Sexually Abused: No    Past Surgical History:  Procedure Laterality Date   ABDOMINAL HYSTERECTOMY     APPENDECTOMY     Bladder Suspension     COLONOSCOPY  10/08/2017   Dr. Marina Goodell   LEFT HEART CATH AND CORONARY ANGIOGRAPHY N/A 09/05/2021   Procedure: LEFT HEART CATH AND CORONARY ANGIOGRAPHY;  Surgeon: Lennette Bihari, MD;  Location: [facility];  Service: Cardiovascular;  Laterality: N/A;    Family History  Problem Relation Age of Onset   COPD Mother        smoker   Diabetes Mother    COPD Father        smoker   Diabetes Father    Diabetes Sister    Diabetes Mellitus II Sister    Diabetes Sister    Diabetes Sister    Diabetes Sister    Lung cancer Nephew 60   Breast cancer Other 31   Colon cancer Neg Hx    Esophageal cancer Neg Hx    Pancreatic cancer Neg Hx    Rectal cancer Neg Hx    Stomach cancer Neg Hx     Allergies  Allergen Reactions   Benadryl [Diphenhydramine Hcl] Anaphylaxis   Ace Inhibitors Cough   Amoxicillin     Unknown reaction   Aspirin Nausea And Vomiting    Tolerates low dose aspirin    Codeine Nausea And Vomiting   Fish Allergy Nausea And Vomiting   Hydrocodone Nausea And Vomiting   Indomethacin     Unknown reaction    Pentazocine Lactate     passing out   Promethazine Hcl     Unknown reaction    Quinine     itching redface   Chlorphen-Phenyleph-Methscop Rash    Current Outpatient Medications on File Prior to Visit  Medication Sig Dispense Refill   acetaminophen (TYLENOL) 500 MG tablet Take 1,000 mg by mouth every 8 (eight) hours as needed for moderate pain.     albuterol (VENTOLIN HFA) 108 (90 Base) MCG/ACT inhaler Inhale 2 puffs into the lungs every 6 (six)  hours as needed for wheezing or shortness of breath. 8 g 0   ALPRAZolam (XANAX) 0.25 MG tablet Take 1 tablet (0.25 mg total) by mouth 2 (two) times daily as needed for anxiety. 60 tablet 0   amLODipine (NORVASC) 5 MG tablet TAKE 1 TABLET(5 MG) BY MOUTH DAILY 90 tablet 3   aspirin 81 MG tablet Take 81 mg by mouth at bedtime.  atorvastatin (LIPITOR) 10 MG tablet TAKE 1 TABLET(10 MG) BY MOUTH DAILY 90 tablet 3   betamethasone valerate ointment (VALISONE) 0.1 % Apply 1 Application topically 2 (two) times daily. Place in the affected area twice a bid for 2 weeks and then place on the area at night twice weekly. 45 g 0   blood glucose meter kit and supplies KIT Dispense based on patient and insurance preference. Use up to four times daily as directed. 1 each 0   Carboxymethylcellul-Glycerin (LUBRICATING EYE DROPS OP) Place 1 drop into both eyes daily as needed (dry eyes).     cholecalciferol (VITAMIN D3) 25 MCG (1000 UT) tablet Take 1,000 Units by mouth daily.     citalopram (CELEXA) 20 MG tablet TAKE 1 TABLET(20 MG) BY MOUTH DAILY 90 tablet 1   Emollient (GOLD BOND DIABETICS DRY SKIN) CREA Apply 1 application. topically at bedtime.     furosemide (LASIX) 20 MG tablet Take one tab daily x 3 days as needed for swelling in legs 90 tablet 0   glucose blood test strip Use to check blood glucose TID 200 each 0   Lancets 28G MISC Use to check blood glucose 4 times daily 100 each 0   levothyroxine (SYNTHROID) 88 MCG tablet TAKE 1 TABLET(88 MCG) BY MOUTH DAILY 90 tablet 3   Lidocaine 4 % AERO Apply 1 spray topically at bedtime as needed (foot pain).     losartan-hydrochlorothiazide (HYZAAR) 100-25 MG tablet TAKE 1 TABLET BY MOUTH DAILY 90 tablet 3   meclizine (ANTIVERT) 25 MG tablet Take 1 tablet (25 mg total) by mouth 3 (three) times daily as needed for dizziness. 30 tablet 0   metFORMIN (GLUCOPHAGE) 500 MG tablet TAKE 1 TABLET(500 MG) BY MOUTH TWICE DAILY WITH A MEAL 180 tablet 0   ondansetron (ZOFRAN-ODT)  8 MG disintegrating tablet Take 1 tablet (8 mg total) by mouth every 8 (eight) hours as needed for nausea or vomiting. 24 tablet 0   pramipexole (MIRAPEX) 1.5 MG tablet TAKE 1 TABLET(1.5 MG) BY MOUTH AT BEDTIME 90 tablet 1   Prenatal Vit-Fe Fumarate-FA (PRENATAL VITAMIN PO) Take 1 tablet by mouth daily.     vitamin B-12 (CYANOCOBALAMIN) 1000 MCG tablet Take 1,000 mcg by mouth daily.     nitroGLYCERIN (NITROSTAT) 0.4 MG SL tablet Place 1 tablet (0.4 mg total) under the tongue every 5 (five) minutes as needed for chest pain. 25 tablet 3   No current facility-administered medications on file prior to visit.    BP 130/60   Pulse 70   Temp 98.2 F (36.8 C) (Oral)   Ht 5' 3.5" (1.613 m)   Wt 180 lb (81.6 kg)   SpO2 94%   BMI 31.39 kg/m       Objective:   Physical Exam Vitals and nursing note reviewed.  Constitutional:      Appearance: Normal appearance.  Cardiovascular:     Rate and Rhythm: Normal rate and regular rhythm.     Heart sounds: Normal heart sounds.  Pulmonary:     Effort: Pulmonary effort is normal.     Breath sounds: Normal breath sounds.  Musculoskeletal:        General: Normal range of motion.  Skin:    General: Skin is warm and dry.     Capillary Refill: Capillary refill takes less than 2 seconds.  Neurological:     Mental Status: She is alert.  Psychiatric:        Mood and Affect: Mood normal.  Behavior: Behavior normal.        Thought Content: Thought content normal.        Judgment: Judgment normal.       Assessment & Plan:  1. Controlled type 2 diabetes mellitus with complication, without long-term current use of insulin (HCC)  - POC HgB A1c- 7.0  - Will increase metformin to 500 mg in the morning and 1000 mg in the evening  - Follow up in 3 months  - metFORMIN (GLUCOPHAGE) 500 MG tablet; Take one tablet in the morning and two tablets in the evening  Dispense: 270 tablet; Refill: 0  2. Essential hypertension - Controlled.   3. Vertigo -  Due to ongoing symptoms will order MRI of brain - Can continue with Vestibular rehab  - MR Brain Wo Contrast; Future  Shirline Frees, NP

## 2022-11-24 ENCOUNTER — Ambulatory Visit (INDEPENDENT_AMBULATORY_CARE_PROVIDER_SITE_OTHER): Payer: Medicare Other

## 2022-11-24 DIAGNOSIS — R42 Dizziness and giddiness: Secondary | ICD-10-CM

## 2022-11-24 DIAGNOSIS — M2602 Maxillary hypoplasia: Secondary | ICD-10-CM | POA: Diagnosis not present

## 2022-11-27 ENCOUNTER — Ambulatory Visit: Payer: Medicare Other

## 2022-11-27 DIAGNOSIS — R2681 Unsteadiness on feet: Secondary | ICD-10-CM | POA: Diagnosis not present

## 2022-11-27 DIAGNOSIS — R42 Dizziness and giddiness: Secondary | ICD-10-CM | POA: Diagnosis not present

## 2022-11-27 NOTE — Therapy (Signed)
OUTPATIENT PHYSICAL THERAPY VESTIBULAR TREATMENT   Patient Name: [name] MRN: [id number] DOB:07-11-1950, 72 y.o., female Today's Date: 11/27/2022    END OF SESSION:  PT End of Session - 11/27/22 0756     Visit Number 9    Number of Visits 16    Date for PT Re-Evaluation 12/20/22    Authorization Type Medicare/MCD    PT Start Time 0800    PT Stop Time 0845    PT Time Calculation (min) 45 min    Activity Tolerance Patient tolerated treatment well    Behavior During Therapy [facility] for tasks assessed/performed             Past Medical History:  Diagnosis Date   Allergy    Anemia    Anxiety    ANXIETY DEPRESSION 09/15/2007   Arthritis    ASTHMA 05/15/2009   Asthma    DIABETES MELLITUS, TYPE II 12/11/2006   DIVERTICULOSIS, COLON 12/11/2006   Edema 01/15/2009   Gout    pt denies; toe was broken   Headache(784.0) 12/11/2006   Hyperlipidemia    Hypertension    HYPERTENSION NEC 08/14/2007   HYPOTHYROIDISM 12/11/2006   PELVIC PAIN, CHRONIC 08/14/2007   Restless leg syndrome    Past Surgical History:  Procedure Laterality Date   ABDOMINAL HYSTERECTOMY     APPENDECTOMY     Bladder Suspension     COLONOSCOPY  10/08/2017   Dr. Marina Goodell   LEFT HEART CATH AND CORONARY ANGIOGRAPHY N/A 09/05/2021   Procedure: LEFT HEART CATH AND CORONARY ANGIOGRAPHY;  Surgeon: Lennette Bihari, MD;  Location: [facility] INVASIVE CV LAB;  Service: Cardiovascular;  Laterality: N/A;   Patient Active Problem List   Diagnosis Date Noted   Chest pain of uncertain etiology    Fracture of second toe, left, closed, initial encounter 09/26/2017   GERD (gastroesophageal reflux disease) 10/19/2014   Essential hypertension 08/16/2014   Breast pain, right 05/20/2014   Restless leg syndrome 05/20/2014   Hip pain, bilateral 10/23/2011   Knee pain, bilateral 10/23/2011   EDEMA 01/15/2009   Hypothyroidism 12/11/2006   Diabetes type 2, uncontrolled 12/11/2006   DIVERTICULOSIS, COLON 12/11/2006    HEADACHE 12/11/2006    PCP: Shirline Frees, NP REFERRING PROVIDER: Shirline Frees, NP  REFERRING DIAG:  H81.13 (ICD-10-CM) - Benign paroxysmal 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 Had MRI on Saturday and feeling better since. Mainly feel the symptoms with bending over   Pt accompanied by: self  PERTINENT HISTORY: past medical history of Allergy, Anemia, Anxiety, ANXIETY DEPRESSION (09/15/2007), Arthritis, ASTHMA (05/15/2009), Asthma, DIABETES MELLITUS, TYPE II (12/11/2006), DIVERTICULOSIS, COLON (12/11/2006), Edema (01/15/2009), Gout, Headache(784.0) (12/11/2006), Hyperlipidemia, Hypertension, HYPERTENSION NEC (08/14/2007), HYPOTHYROIDISM (12/11/2006), PELVIC PAIN, CHRONIC (08/14/2007), and Restless leg syndrome.  PAIN:  Are you having pain?  Reports chronic LBP but no worse  PRECAUTIONS: None  WEIGHT BEARING RESTRICTIONS: No  FALLS: Has patient fallen in last 6 months? Yes. Number of falls 2 falling forward since onset of dizziness  LIVING ENVIRONMENT: Lives with: lives alone Lives in: House/apartment Stairs: No Has following equipment at home:  has cane  PLOF: Independent  PATIENT GOALS: Get rid of dizziness  OBJECTIVE:   TODAY'S TREATMENT: 11/27/22 Activity Comments  Right roll test No nystagmus, no dizziness  Left roll test No nystagmus,  no dizziness  Right Dix-Hallpike No nystagmus, no dizziness  Left Dix-Hallpike No nystagmus, "blip of something"  Forward-T Motion sensitivity with arising  Nose to knee 3x  Standing balance -feet together EC x 30 sec on firm -feet together EO/EC head turns 3x on firm -feet apart EO/EC x 30 sec on foam, head turns 3x EO/EC -feet together eyes closed x 30 sec on foam      TODAY'S TREATMENT:  11/22/22 Activity Comments  L roll  Apogeotropic nystagmus persisting; no dizziness  R roll Larger amplitude Apogeotropic nystagmus persisting; c/o dizziness   Kim maneuver (to L side first) Vibration over L mastoid for 20 sec in position 1 and 4; pt tolerated well. Geotropic nystagmus visible in position 4  L roll Apogeotropic nystagmus persisting; no dizziness  R roll negative  L BBQ roll  Tolerated well  L roll  Apogeotropic nystagmus ~ 1 min no dizziness   R roll Negative             HOME EXERCISE PROGRAM: Access Code: [email or web address] URL: [email or web address] Date: 11/22/2022 Prepared by: [facility] - Outpatient  Rehab - [facility]  Exercises - Standing Gaze Stabilization with Head Rotation  - 1 x daily - 7 x weekly - 3 sets - 10 reps - Standing Gaze Stabilization with Head Nod  - 1 x daily - 7 x weekly - 3 sets - 10 reps - Standing VOR Cancellation  - 1 x daily - 7 x weekly - 3 sets - 10 reps - Corner Balance Feet Together With Eyes Open  - 1 x daily - 7 x weekly - 3 sets - 30 sec hold - Corner Balance Feet Together With Eyes Closed  - 1 x daily - 7 x weekly - 3 sets - 30 sec hold - Corner Balance Feet Together: Eyes Open With Head Turns  - 1 x daily - 7 x weekly - 3 sets - 3 reps - Corner Balance Feet Together: Eyes Closed With Head Turns  - 1 x daily - 7 x weekly - 3 sets - 3 reps - Supine to Left Sidelying Vestibular Habituation  - 1 x daily - 5 x weekly - 2 sets - 3-5 reps - Supine to Right Sidelying Vestibular Habituation  - 1 x daily - 5 x weekly - 2 sets - 3-5 reps - Seated Nose to Left Knee Vestibular Habituation  - 1 x daily - 7 x weekly - 1-3 sets - 10 reps    PATIENT EDUCATION: Education details: edu on HEP update with edu on safety, post CRM expectations  Person educated: Patient Education method: Explanation, Demonstration, Tactile cues, Verbal cues, and Handouts Education comprehension: verbalized understanding and returned  demonstration   ____________________________________________________________________________________________________________________ (Measures in this section from initial evaluation unless otherwise noted) SENSATION: NT, pt reports diabetic neuropathy affecting dorsal feet  EDEMA:  BLE lower leg >  POSTURE:  No Significant postural limitations  Cervical ROM:   Extension/flexion guarded due to symptoms  GAIT: Gait pattern: WFL Distance walked:  Assistive device utilized: None Level of assistance: Complete Independence  VESTIBULAR ASSESSMENT:  GENERAL OBSERVATION: wears bifocals   SYMPTOM BEHAVIOR:  Subjective history: sudden onset dizziness in May, present ever since  Non-Vestibular symptoms:  none of note  Type of dizziness: Imbalance (Disequilibrium) and Spinning/Vertigo  Frequency: everyday  Duration: much of the day and increased with certain positions  Aggravating factors: Induced by position change: supine to sit and sit to stand, Induced by motion: looking up at  the ceiling, bending down to the ground, and turning head quickly, Worse outside or in busy environment, and Moving eyes  Relieving factors: rest, slow movements, and avoid busy/distracting environments  Progression of symptoms: unchanged  OCULOMOTOR EXAM:  Ocular Alignment: normal  Ocular ROM: No Limitations  Spontaneous Nystagmus: absent  Gaze-Induced Nystagmus: absent  Smooth Pursuits: intact, symptomtaic  Saccades: intact, reports 3/10 symptoms horizontal, 5/10 vertical  Convergence/Divergence: 6 cm   VESTIBULAR - OCULAR REFLEX:   Slow VOR: Comment: slower speed, reports blurriness  VOR Cancellation: Normal  Head-Impulse Test: HIT Right: negative HIT Left: negative  Dynamic Visual Acuity: Not able to be assessed TBD   POSITIONAL TESTING: Right Dix-Hallpike: no nystagmus Left Dix-Hallpike: upbeating, left nystagmus Right Roll Test: no nystagmus Left Roll Test: no nystagmus  FUNCTIONAL GAIT:  Functional gait assessment: TBD  M-CTSIB  Condition 1: Firm Surface, EO 30 Sec, Normal and Mild Sway  Condition 2: Firm Surface, EC 30 Sec, Mild Sway  Condition 3: Foam Surface, EO 30 Sec, Moderate Sway  Condition 4: Foam Surface, EC 9 Sec, Moderate and Severe Sway   GOALS: Goals reviewed with patient? Yes  SHORT TERM GOALS: Target date: same as LTG  LONG TERM GOALS: Target date: 12/20/2022   Pt to report absence of positional dizziness  Baseline: +left Dix-Hallpike; positive R/L roll test 11/22/22 Goal status: IN PROGRESS 11/22/22  2.  Pt to report absence of symptoms with standing VOR x 1 x 30 sec Baseline: 5 reps; NT d/t severity of sx 11/22/22 Goal status: IN PROGRESS 11/22/22  3.  Demo improved postural stability per mild sway x30 sec condition 4 M-CTSIB to improve postural stability and safety with ADL Baseline: NT d/t severity of sx 11/22/22 Goal status: IN PROGRESS 11/22/22  ASSESSMENT:  CLINICAL IMPRESSION: Repeat of positional testing reveals no nystagmus or dizziness today! Notes continued unsteadiness/motion sensitivity with forward bending and arising. Trial at counter for forward-T with pt feeling unsteady in performance.  Supplemented this with Nose to knee for habituation which was tolerated better.  Ended session with multi-sensory balance challenges to improve steadiness on feet.  Will follow up with continued sessions to address deficits and repeat positional testing as indicated  OBJECTIVE IMPAIRMENTS: decreased activity tolerance, decreased balance, and dizziness.   PLAN:  PT FREQUENCY: 1-2x/week  PT DURATION: 4 weeks  PLANNED INTERVENTIONS: Therapeutic exercises, Therapeutic activity, Neuromuscular re-education, Balance training, Gait training, Patient/Family education, Self Care, Joint mobilization, Stair training, Vestibular training, Canalith repositioning, DME instructions, Aquatic Therapy, Dry Needling, Spinal mobilization, Cryotherapy, Moist heat, Taping,  Ionotophoresis 4mg /ml Dexamethasone, and Manual therapy  PLAN FOR NEXT SESSION: re-assess positional dizziness, continue to habituate   8:45 AM, 11/27/22 M. Shary Decamp, PT, DPT Physical Therapist- [place] Office Number: [phone]

## 2022-11-29 ENCOUNTER — Encounter (INDEPENDENT_AMBULATORY_CARE_PROVIDER_SITE_OTHER): Payer: Self-pay

## 2022-11-29 ENCOUNTER — Ambulatory Visit: Payer: Medicare Other

## 2022-11-29 ENCOUNTER — Encounter: Payer: Self-pay | Admitting: Adult Health

## 2022-11-29 DIAGNOSIS — R42 Dizziness and giddiness: Secondary | ICD-10-CM | POA: Diagnosis not present

## 2022-11-29 DIAGNOSIS — R2681 Unsteadiness on feet: Secondary | ICD-10-CM

## 2022-11-29 NOTE — Therapy (Signed)
OUTPATIENT PHYSICAL THERAPY VESTIBULAR TREATMENT   Patient Name: [name] MRN: [id number] DOB:1950/11/14, 72 y.o., female Today's Date: 11/29/2022    END OF SESSION:  PT End of Session - 11/29/22 0800     Visit Number 10    Number of Visits 16    Date for PT Re-Evaluation 12/20/22    Authorization Type Medicare/MCD    Progress Note Due on Visit 19    PT Start Time 0800    PT Stop Time 0845    PT Time Calculation (min) 45 min    Activity Tolerance Patient tolerated treatment well    Behavior During Therapy [facility] for tasks assessed/performed             Past Medical History:  Diagnosis Date   Allergy    Anemia    Anxiety    ANXIETY DEPRESSION 09/15/2007   Arthritis    ASTHMA 05/15/2009   Asthma    DIABETES MELLITUS, TYPE II 12/11/2006   DIVERTICULOSIS, COLON 12/11/2006   Edema 01/15/2009   Gout    pt denies; toe was broken   Headache(784.0) 12/11/2006   Hyperlipidemia    Hypertension    HYPERTENSION NEC 08/14/2007   HYPOTHYROIDISM 12/11/2006   PELVIC PAIN, CHRONIC 08/14/2007   Restless leg syndrome    Past Surgical History:  Procedure Laterality Date   ABDOMINAL HYSTERECTOMY     APPENDECTOMY     Bladder Suspension     COLONOSCOPY  10/08/2017   Dr. Marina Goodell   LEFT HEART CATH AND CORONARY ANGIOGRAPHY N/A 09/05/2021   Procedure: LEFT HEART CATH AND CORONARY ANGIOGRAPHY;  Surgeon: Lennette Bihari, MD;  Location: [facility] INVASIVE CV LAB;  Service: Cardiovascular;  Laterality: N/A;   Patient Active Problem List   Diagnosis Date Noted   Chest pain of uncertain etiology    Fracture of second toe, left, closed, initial encounter 09/26/2017   GERD (gastroesophageal reflux disease) 10/19/2014   Essential hypertension 08/16/2014   Breast pain, right 05/20/2014   Restless leg syndrome 05/20/2014   Hip pain, bilateral 10/23/2011   Knee pain, bilateral 10/23/2011   EDEMA 01/15/2009   Hypothyroidism 12/11/2006   Diabetes type 2, uncontrolled 12/11/2006    DIVERTICULOSIS, COLON 12/11/2006   HEADACHE 12/11/2006    PCP: Shirline Frees, NP REFERRING PROVIDER: Shirline Frees, NP  REFERRING DIAG:  H81.13 (ICD-10-CM) - Benign paroxysmal positional vertigo due to bilateral vestibular disorder    THERAPY DIAG:  Dizziness and giddiness  Unsteadiness on feet  ONSET DATE: May 2024  Rationale for Evaluation and Treatment: Rehabilitation  SUBJECTIVE:   SUBJECTIVE STATEMENT: No positional dizziness, noting some lingering unsteadiness.    Pt accompanied by: self  PERTINENT HISTORY: past medical history of Allergy, Anemia, Anxiety, ANXIETY DEPRESSION (09/15/2007), Arthritis, ASTHMA (05/15/2009), Asthma, DIABETES MELLITUS, TYPE II (12/11/2006), DIVERTICULOSIS, COLON (12/11/2006), Edema (01/15/2009), Gout, Headache(784.0) (12/11/2006), Hyperlipidemia, Hypertension, HYPERTENSION NEC (08/14/2007), HYPOTHYROIDISM (12/11/2006), PELVIC PAIN, CHRONIC (08/14/2007), and Restless leg syndrome.  PAIN:  Are you having pain?  Reports chronic LBP but no worse  PRECAUTIONS: None  WEIGHT BEARING RESTRICTIONS: No  FALLS: Has patient fallen in last 6 months? Yes. Number of falls 2 falling forward since onset of dizziness  LIVING ENVIRONMENT: Lives with: lives alone Lives in: House/apartment Stairs: No Has following equipment at home:  has cane  PLOF: Independent  PATIENT GOALS: Get rid of dizziness  OBJECTIVE:   TODAY'S TREATMENT: 11/29/22 Activity Comments  Functional Gait Assessment 25/30  Standing VOR x 1 30 sec, good speed, no symptoms  Standing VOR cancellation 30 sec, good speed, no symptoms  M-CTSIB Condition 1: normal Condition 2: normal Condition 3: normal Condition 4: moderate  HEP review -discussion of activities of habituation vs maintenance         TODAY'S TREATMENT: 11/27/22 Activity Comments  Right roll test No nystagmus, no dizziness  Left roll test No nystagmus, no dizziness  Right Dix-Hallpike No nystagmus, no dizziness   Left Dix-Hallpike No nystagmus, "blip of something"  Forward-T Motion sensitivity with arising  Nose to knee 3x  Standing balance -feet together EC x 30 sec on firm -feet together EO/EC head turns 3x on firm -feet apart EO/EC x 30 sec on foam, head turns 3x EO/EC -feet together eyes closed x 30 sec on foam      TODAY'S TREATMENT: 11/22/22 Activity Comments  L roll  Apogeotropic nystagmus persisting; no dizziness  R roll Larger amplitude Apogeotropic nystagmus persisting; c/o dizziness   Kim maneuver (to L side first) Vibration over L mastoid for 20 sec in position 1 and 4; pt tolerated well. Geotropic nystagmus visible in position 4  L roll Apogeotropic nystagmus persisting; no dizziness  R roll negative  L BBQ roll  Tolerated well  L roll  Apogeotropic nystagmus ~ 1 min no dizziness   R roll Negative             HOME EXERCISE PROGRAM: Access Code: [email or web address] URL: [email or web address] Date: 11/22/2022 Prepared by: [facility] - Outpatient  Rehab - [facility]  Exercises - Standing Gaze Stabilization with Head Rotation  - 1 x daily - 7 x weekly - 3 sets - 10 reps - Standing Gaze Stabilization with Head Nod  - 1 x daily - 7 x weekly - 3 sets - 10 reps - Standing VOR Cancellation  - 1 x daily - 7 x weekly - 3 sets - 10 reps - Corner Balance Feet Together With Eyes Open  - 1 x daily - 7 x weekly - 3 sets - 30 sec hold - Corner Balance Feet Together With Eyes Closed  - 1 x daily - 7 x weekly - 3 sets - 30 sec hold - Corner Balance Feet Together: Eyes Open With Head Turns  - 1 x daily - 7 x weekly - 3 sets - 3 reps - Corner Balance Feet Together: Eyes Closed With Head Turns  - 1 x daily - 7 x weekly - 3 sets - 3 reps - Supine to Left Sidelying Vestibular Habituation  - 1 x daily - 5 x weekly - 2 sets - 3-5 reps - Supine to Right Sidelying Vestibular Habituation  - 1 x daily - 5 x weekly - 2 sets - 3-5 reps - Seated Nose to Left Knee Vestibular Habituation  - 1 x  daily - 7 x weekly - 1-3 sets - 10 reps    PATIENT EDUCATION: Education details: edu on HEP update with edu on safety, post CRM expectations  Person educated: Patient Education method: Explanation, Demonstration, Tactile cues, Verbal cues, and Handouts Education comprehension: verbalized understanding and returned demonstration   ____________________________________________________________________________________________________________________ (Measures in this section from initial evaluation unless otherwise noted) SENSATION: NT, pt reports diabetic neuropathy affecting dorsal feet  EDEMA:  BLE lower leg >  POSTURE:  No Significant postural limitations  Cervical ROM:   Extension/flexion guarded due to symptoms  GAIT: Gait pattern: WFL Distance walked:  Assistive device utilized: None Level of assistance: Complete Independence  VESTIBULAR ASSESSMENT:  GENERAL OBSERVATION: wears bifocals   SYMPTOM BEHAVIOR:  Subjective history: sudden onset dizziness in May, present ever since  Non-Vestibular symptoms:  none of note  Type of dizziness: Imbalance (Disequilibrium) and Spinning/Vertigo  Frequency: everyday  Duration: much of the day and increased with certain positions  Aggravating factors: Induced by position change: supine to sit and sit to stand, Induced by motion: looking up at the ceiling, bending down to the ground, and turning head quickly, Worse outside or in busy environment, and Moving eyes  Relieving factors: rest, slow movements, and avoid busy/distracting environments  Progression of symptoms: unchanged  OCULOMOTOR EXAM:  Ocular Alignment: normal  Ocular ROM: No Limitations  Spontaneous Nystagmus: absent  Gaze-Induced Nystagmus: absent  Smooth Pursuits: intact, symptomtaic  Saccades: intact, reports 3/10 symptoms horizontal, 5/10 vertical  Convergence/Divergence: 6 cm   VESTIBULAR - OCULAR REFLEX:   Slow VOR: Comment: slower speed, reports  blurriness  VOR Cancellation: Normal  Head-Impulse Test: HIT Right: negative HIT Left: negative  Dynamic Visual Acuity: Not able to be assessed TBD   POSITIONAL TESTING: Right Dix-Hallpike: no nystagmus Left Dix-Hallpike: upbeating, left nystagmus Right Roll Test: no nystagmus Left Roll Test: no nystagmus  FUNCTIONAL GAIT: Functional gait assessment: TBD  M-CTSIB  Condition 1: Firm Surface, EO 30 Sec, Normal and Mild Sway  Condition 2: Firm Surface, EC 30 Sec, Mild Sway  Condition 3: Foam Surface, EO 30 Sec, Moderate Sway  Condition 4: Foam Surface, EC 9 Sec, Moderate and Severe Sway   GOALS: Goals reviewed with patient? Yes  SHORT TERM GOALS: Target date: same as LTG  LONG TERM GOALS: Target date: 12/20/2022   Pt to report absence of positional dizziness  Baseline: +left Dix-Hallpike; positive R/L roll test 11/22/22 Goal status: IN PROGRESS 11/22/22  2.  Pt to report absence of symptoms with standing VOR x 1 x 30 sec Baseline: 5 reps; NT d/t severity of sx 11/22/22 Goal status: IN PROGRESS 11/22/22  3.  Demo improved postural stability per mild sway x30 sec condition 4 M-CTSIB to improve postural stability and safety with ADL Baseline: NT d/t severity of sx 11/22/22 Goal status: IN PROGRESS 11/22/22  ASSESSMENT:  CLINICAL IMPRESSION: Returns to clinic thankfully with positional vertigo symptoms still absent and reports improved function in home and volunteer activities where repeated bending/flexion did not provoke.  Functional Gait Assessment performed and scores 25/30 indicating low risk for falls improved form initial score of 18/30.  No deficits or symptoms with gaze stabilization and improved performance M-CTSIB with moderate sway under condition 4.  Very mild provocation with nose to knee.  We anticipate one additional visit for final follow-up and if asymptomatic anticipate going on 30 day hold and subsequent D/C if resolved  OBJECTIVE IMPAIRMENTS: decreased activity tolerance,  decreased balance, and dizziness.   PLAN:  PT FREQUENCY: 1-2x/week  PT DURATION: 4 weeks  PLANNED INTERVENTIONS: Therapeutic exercises, Therapeutic activity, Neuromuscular re-education, Balance training, Gait training, Patient/Family education, Self Care, Joint mobilization, Stair training, Vestibular training, Canalith repositioning, DME instructions, Aquatic Therapy, Dry Needling, Spinal mobilization, Cryotherapy, Moist heat, Taping, Ionotophoresis 4mg /ml Dexamethasone, and Manual therapy  PLAN FOR NEXT SESSION: re-assess positional dizziness as needed.  Anticipate one additional f/u and move to 30-day hold if resolved   8:00 AM, 11/29/22 M. Shary Decamp, PT, DPT Physical Therapist- [place] Office Number: [phone]

## 2022-12-04 ENCOUNTER — Ambulatory Visit: Payer: Medicare Other

## 2022-12-04 DIAGNOSIS — R42 Dizziness and giddiness: Secondary | ICD-10-CM | POA: Diagnosis not present

## 2022-12-04 DIAGNOSIS — R2681 Unsteadiness on feet: Secondary | ICD-10-CM

## 2022-12-04 NOTE — Therapy (Signed)
OUTPATIENT PHYSICAL THERAPY VESTIBULAR TREATMENT and D/C Summary   Patient Name: [name] MRN: [id number] DOB:[birth year], 72 y.o., female Today's Date: 12/04/2022  PHYSICAL THERAPY DISCHARGE SUMMARY  Visits from Start of Care: 11  Current functional level related to goals / functional outcomes: Improved symptoms and met all goals   Remaining deficits: Motion sensitivity with forward flexion   Education / Equipment: HEP    Patient agrees to discharge. Patient goals were met. Patient is being discharged due to meeting the stated rehab goals. Will place on 30-day hold in case of return of positional dizziness   END OF SESSION:  PT End of Session - 12/04/22 0800     Visit Number 11    Number of Visits 16    Date for PT Re-Evaluation 12/20/22    Authorization Type Medicare/MCD    Progress Note Due on Visit 19    PT Start Time 0800    PT Stop Time 0845    PT Time Calculation (min) 45 min    Activity Tolerance Patient tolerated treatment well    Behavior During Therapy [facility] for tasks assessed/performed             Past Medical History:  Diagnosis Date   Allergy    Anemia    Anxiety    ANXIETY DEPRESSION 09/15/2007   Arthritis    ASTHMA 05/15/2009   Asthma    DIABETES MELLITUS, TYPE II 12/11/2006   DIVERTICULOSIS, COLON 12/11/2006   Edema 01/15/2009   Gout    pt denies; toe was broken   Headache(784.0) 12/11/2006   Hyperlipidemia    Hypertension    HYPERTENSION NEC 08/14/2007   HYPOTHYROIDISM 12/11/2006   PELVIC PAIN, CHRONIC 08/14/2007   Restless leg syndrome    Past Surgical History:  Procedure Laterality Date   ABDOMINAL HYSTERECTOMY     APPENDECTOMY     Bladder Suspension     COLONOSCOPY  10/08/2017   Dr. Marina Goodell   LEFT HEART CATH AND CORONARY ANGIOGRAPHY N/A 09/05/2021   Procedure: LEFT HEART CATH AND CORONARY ANGIOGRAPHY;  Surgeon: Lennette Bihari, MD;  Location: [facility] INVASIVE [facility];  Service: Cardiovascular;  Laterality: N/A;    Patient Active Problem List   Diagnosis Date Noted   Chest pain of uncertain etiology    Fracture of second toe, left, closed, initial encounter 09/26/2017   GERD (gastroesophageal reflux disease) 10/19/2014   Essential hypertension 08/16/2014   Breast pain, right 05/20/2014   Restless leg syndrome 05/20/2014   Hip pain, bilateral 10/23/2011   Knee pain, bilateral 10/23/2011   EDEMA 01/15/2009   Hypothyroidism 12/11/2006   Diabetes type 2, uncontrolled 12/11/2006   DIVERTICULOSIS, COLON 12/11/2006   HEADACHE 12/11/2006    PCP: Shirline Frees, NP REFERRING PROVIDER: Shirline Frees, NP  REFERRING DIAG:  H81.13 (ICD-10-CM) - Benign paroxysmal positional vertigo due to bilateral vestibular disorder    THERAPY DIAG:  Dizziness and giddiness  Unsteadiness on feet  ONSET DATE: May 2024  Rationale for Evaluation and Treatment: Rehabilitation  SUBJECTIVE:   SUBJECTIVE STATEMENT: Positional dizziness is improved.  Still woozy with bending over.   Pt accompanied by: self  PERTINENT HISTORY: past medical history of Allergy, Anemia, Anxiety, ANXIETY DEPRESSION (09/15/2007), Arthritis, ASTHMA (05/15/2009), Asthma, DIABETES MELLITUS, TYPE II (12/11/2006), DIVERTICULOSIS, COLON (12/11/2006), Edema (01/15/2009), Gout, Headache(784.0) (12/11/2006), Hyperlipidemia, Hypertension, HYPERTENSION NEC (08/14/2007), HYPOTHYROIDISM (12/11/2006), PELVIC PAIN, CHRONIC (08/14/2007), and Restless leg syndrome.  PAIN:  Are you having pain?  Reports chronic LBP but no worse  PRECAUTIONS: None  WEIGHT BEARING RESTRICTIONS: No  FALLS: Has patient fallen in last 6 months? Yes. Number of falls 2 falling forward since onset of dizziness  LIVING ENVIRONMENT: Lives with: lives alone Lives in: House/apartment Stairs: No Has following equipment at home:  has cane  PLOF: Independent  PATIENT GOALS: Get rid of dizziness  OBJECTIVE:   TODAY'S TREATMENT: 12/04/22 Activity Comments  Dynamic  Balance 2x50 ft Retro-walking  Tandem walk sidestepping  Forward-T -feeling of woozy,   Static balance -tandem stance med ball twist -standing on foam: granny toss, chest pass, eyes closed x 30 sec. Sit to stand x 5 -rocker board 2x60 sec  LTG review            TODAY'S TREATMENT: 11/29/22 Activity Comments  Functional Gait Assessment 25/30  Standing VOR x 1 30 sec, good speed, no symptoms  Standing VOR cancellation 30 sec, good speed, no symptoms  M-CTSIB Condition 1: normal Condition 2: normal Condition 3: normal Condition 4: moderate  HEP review -discussion of activities of habituation vs maintenance             HOME EXERCISE PROGRAM: Access Code: [email or web address] URL: [email or web address] Date: 11/22/2022 Prepared by: [facility] - Outpatient  Rehab - [facility]  Exercises - Standing Gaze Stabilization with Head Rotation  - 1 x daily - 7 x weekly - 3 sets - 10 reps - Standing Gaze Stabilization with Head Nod  - 1 x daily - 7 x weekly - 3 sets - 10 reps - Standing VOR Cancellation  - 1 x daily - 7 x weekly - 3 sets - 10 reps - Corner Balance Feet Together With Eyes Open  - 1 x daily - 7 x weekly - 3 sets - 30 sec hold - Corner Balance Feet Together With Eyes Closed  - 1 x daily - 7 x weekly - 3 sets - 30 sec hold - Corner Balance Feet Together: Eyes Open With Head Turns  - 1 x daily - 7 x weekly - 3 sets - 3 reps - Corner Balance Feet Together: Eyes Closed With Head Turns  - 1 x daily - 7 x weekly - 3 sets - 3 reps - Supine to Left Sidelying Vestibular Habituation  - 1 x daily - 5 x weekly - 2 sets - 3-5 reps - Supine to Right Sidelying Vestibular Habituation  - 1 x daily - 5 x weekly - 2 sets - 3-5 reps - Seated Nose to Left Knee Vestibular Habituation  - 1 x daily - 7 x weekly - 1-3 sets - 10 reps    PATIENT EDUCATION: Education details: edu on HEP update with edu on safety, post CRM expectations  Person educated: Patient Education method:  Explanation, Demonstration, Tactile cues, Verbal cues, and Handouts Education comprehension: verbalized understanding and returned demonstration   ____________________________________________________________________________________________________________________ (Measures in this section from initial evaluation unless otherwise noted) SENSATION: NT, pt reports diabetic neuropathy affecting dorsal feet  EDEMA:  BLE lower leg >  POSTURE:  No Significant postural limitations  Cervical ROM:   Extension/flexion guarded due to symptoms  GAIT: Gait pattern: WFL Distance walked:  Assistive device utilized: None Level of assistance: Complete Independence  VESTIBULAR ASSESSMENT:  GENERAL OBSERVATION: wears bifocals   SYMPTOM BEHAVIOR:  Subjective history: sudden onset dizziness in May, present ever since  Non-Vestibular symptoms:  none of note  Type of dizziness: Imbalance (Disequilibrium) and Spinning/Vertigo  Frequency: everyday  Duration: much of the day and increased with certain positions  Aggravating factors: Induced  by position change: supine to sit and sit to stand, Induced by motion: looking up at the ceiling, bending down to the ground, and turning head quickly, Worse outside or in busy environment, and Moving eyes  Relieving factors: rest, slow movements, and avoid busy/distracting environments  Progression of symptoms: unchanged  OCULOMOTOR EXAM:  Ocular Alignment: normal  Ocular ROM: No Limitations  Spontaneous Nystagmus: absent  Gaze-Induced Nystagmus: absent  Smooth Pursuits: intact, symptomtaic  Saccades: intact, reports 3/10 symptoms horizontal, 5/10 vertical  Convergence/Divergence: 6 cm   VESTIBULAR - OCULAR REFLEX:   Slow VOR: Comment: slower speed, reports blurriness  VOR Cancellation: Normal  Head-Impulse Test: HIT Right: negative HIT Left: negative  Dynamic Visual Acuity: Not able to be assessed TBD   POSITIONAL TESTING: Right Dix-Hallpike: no  nystagmus Left Dix-Hallpike: upbeating, left nystagmus Right Roll Test: no nystagmus Left Roll Test: no nystagmus  FUNCTIONAL GAIT: Functional gait assessment: TBD  M-CTSIB  Condition 1: Firm Surface, EO 30 Sec, Normal and Mild Sway  Condition 2: Firm Surface, EC 30 Sec, Mild Sway  Condition 3: Foam Surface, EO 30 Sec, Moderate Sway  Condition 4: Foam Surface, EC 9 Sec, Moderate and Severe Sway   GOALS: Goals reviewed with patient? Yes  SHORT TERM GOALS: Target date: same as LTG  LONG TERM GOALS: Target date: 12/20/2022   Pt to report absence of positional dizziness  Baseline: +left Dix-Hallpike; positive R/L roll test 11/22/22 Goal status: MET 11/22/22  2.  Pt to report absence of symptoms with standing VOR x 1 x 30 sec Baseline: 5 reps; NT d/t severity of sx 11/22/22 Goal status: MET 11/22/22  3.  Demo improved postural stability per mild sway x30 sec condition 4 M-CTSIB to improve postural stability and safety with ADL Baseline: NT d/t severity of sx 11/22/22 Goal status: MET 11/22/22  ASSESSMENT:  CLINICAL IMPRESSION: Overall marked improvements with absence of positional vertigo/dizziness since last encounter with canalith repositioning.  Pt notes overall much improved symptoms and function with main issue at present is with forward bending.  Pt provided with relevant HEP and progression.  Returns demonstrtaion of techniques and understanding.  Will place on 30-day hold at this time in case of return of positional dizziness.  OBJECTIVE IMPAIRMENTS: decreased activity tolerance, decreased balance, and dizziness.   PLAN:  PT FREQUENCY: 1-2x/week  PT DURATION: 4 weeks  PLANNED INTERVENTIONS: Therapeutic exercises, Therapeutic activity, Neuromuscular re-education, Balance training, Gait training, Patient/Family education, Self Care, Joint mobilization, Stair training, Vestibular training, Canalith repositioning, DME instructions, Aquatic Therapy, Dry Needling, Spinal mobilization,  Cryotherapy, Moist heat, Taping, Ionotophoresis 4mg /ml Dexamethasone, and Manual therapy  PLAN FOR NEXT SESSION: 30-day hold and D/C   8:01 AM, 12/04/22 M. Shary Decamp, PT, DPT Physical Therapist- [place] Office Number: [phone]

## 2022-12-06 ENCOUNTER — Ambulatory Visit: Payer: Medicare Other

## 2022-12-12 ENCOUNTER — Telehealth: Payer: Self-pay | Admitting: Adult Health

## 2022-12-12 NOTE — Telephone Encounter (Signed)
Updated patient on her MRI results which showed  IMPRESSION: 1. No acute intracranial abnormality and essentially normal for age noncontrast MRI appearance of the Brain. 2. Mild mastoid air cell fluid on the right, most often a postinflammatory finding. Consider previous right side ear infection/inflammation.    She reports that she is feeling better. Denies ear pain or fevers. Vertigo has resolved

## 2022-12-14 ENCOUNTER — Encounter: Payer: Self-pay | Admitting: Adult Health

## 2022-12-14 DIAGNOSIS — F419 Anxiety disorder, unspecified: Secondary | ICD-10-CM

## 2022-12-14 MED ORDER — CITALOPRAM HYDROBROMIDE 20 MG PO TABS: ORAL_TABLET | ORAL | 1 refills | Status: DC

## 2022-12-18 DIAGNOSIS — Z23 Encounter for immunization: Secondary | ICD-10-CM | POA: Diagnosis not present

## 2023-01-11 ENCOUNTER — Ambulatory Visit (INDEPENDENT_AMBULATORY_CARE_PROVIDER_SITE_OTHER): Payer: Medicare Other

## 2023-01-11 VITALS — Ht 63.5 in | Wt 180.0 lb

## 2023-01-11 DIAGNOSIS — Z0110 Encounter for examination of ears and hearing without abnormal findings: Secondary | ICD-10-CM

## 2023-01-11 DIAGNOSIS — Z0000 Encounter for general adult medical examination without abnormal findings: Secondary | ICD-10-CM

## 2023-01-11 NOTE — Patient Instructions (Addendum)
[name] , Thank you for taking time to come for your Medicare Wellness Visit. I appreciate your ongoing commitment to your health goals. Please review the following plan we discussed and let me know if I can assist you in the future.   Referrals/Orders/Follow-Ups/Clinician Recommendations:   This is a list of the screening recommended for you and due dates:  Health Maintenance  Topic Date Due   Zoster (Shingles) Vaccine (1 of 2) [birth year]/1971   Flu Shot  11/15/2022   COVID-19 Vaccine (7 - 2023-24 season) 12/16/2022   Mammogram  05/05/2023   Yearly kidney function blood test for diabetes  05/12/2023   Yearly kidney health urinalysis for diabetes  05/12/2023   Complete foot exam   05/12/2023   Eye exam for diabetics  05/16/2023   Hemoglobin A1C  05/25/2023   Medicare Annual Wellness Visit  01/11/2024   Colon Cancer Screening  09/20/2027   DTaP/Tdap/Td vaccine (4 - Tdap) 12/17/2032   Pneumonia Vaccine  Completed   DEXA scan (bone density measurement)  Completed   Hepatitis C Screening  Completed   HPV Vaccine  Aged Out    Advanced directives: (In Chart) A copy of your advanced directives are scanned into your chart should your provider ever need it.  Next Medicare Annual Wellness Visit scheduled for next year: Yes

## 2023-01-11 NOTE — Progress Notes (Signed)
Subjective:   [name] is a 72 y.o. female who presents for Medicare Annual (Subsequent) preventive examination.  Visit Complete: Virtual  I connected with  [name] on 01/11/23 by a audio enabled telemedicine application and verified that I am speaking with the correct person using two identifiers.  Patient Location: Home  Provider Location: Home Office  I discussed the limitations of evaluation and management by telemedicine. The patient expressed understanding and agreed to proceed.  Patient Medicare AWV questionnaire was completed by the patient on 01/07/23; I have confirmed that all information answered by patient is correct and no changes since this date.  Cardiac Risk Factors include: diabetes mellitus;advanced age (>29men, >74 women);hypertensionBecause this visit was a virtual/telehealth visit, some criteria may be missing or patient reported. Any vitals not documented were not able to be obtained and vitals that have been documented are patient reported.       Objective:    Today's Vitals   01/11/23 1111  Weight: 180 lb (81.6 kg)  Height: 5' 3.5" (1.613 m)   Body mass index is 31.39 kg/m.     01/11/2023   11:28 AM 10/25/2022   12:49 PM 01/04/2022    3:53 PM 09/05/2021    6:18 AM 01/02/2021    8:39 AM 10/31/2020   11:44 AM 01/01/2020   10:07 AM  Advanced Directives  Does Patient Have a Medical Advance Directive? Yes Yes Yes Yes Yes Yes No  Type of Estate agent of [place];Living will  Healthcare Power of [place];Living will Healthcare Power of [place];Living will Healthcare Power of [place];Living will Healthcare Power of [place];Living will   Does patient want to make changes to medical advance directive? No - Patient declined No - Patient declined No - Patient declined No - Patient declined  No - Patient declined   Copy of Healthcare Power of Attorney in Chart? Yes - validated most recent copy scanned in chart  (See row information)  Yes - validated most recent copy scanned in chart (See row information) No - copy requested No - copy requested Yes - validated most recent copy scanned in chart (See row information)   Would patient like information on creating a medical advance directive?  No - Patient declined     No - Patient declined    Current Medications (verified) Outpatient Encounter Medications as of 01/11/2023  Medication Sig   acetaminophen (TYLENOL) 500 MG tablet Take 1,000 mg by mouth every 8 (eight) hours as needed for moderate pain.   albuterol (VENTOLIN HFA) 108 (90 Base) MCG/ACT inhaler Inhale 2 puffs into the lungs every 6 (six) hours as needed for wheezing or shortness of breath.   ALPRAZolam (XANAX) 0.25 MG tablet Take 1 tablet (0.25 mg total) by mouth 2 (two) times daily as needed for anxiety.   amLODipine (NORVASC) 5 MG tablet TAKE 1 TABLET(5 MG) BY MOUTH DAILY   aspirin 81 MG tablet Take 81 mg by mouth at bedtime.   atorvastatin (LIPITOR) 10 MG tablet TAKE 1 TABLET(10 MG) BY MOUTH DAILY   betamethasone valerate ointment (VALISONE) 0.1 % Apply 1 Application topically 2 (two) times daily. Place in the affected area twice a bid for 2 weeks and then place on the area at night twice weekly.   blood glucose meter kit and supplies KIT Dispense based on patient and insurance preference. Use up to four times daily as directed.   Carboxymethylcellul-Glycerin (LUBRICATING EYE DROPS OP) Place 1 drop into both eyes daily as needed (  dry eyes).   cholecalciferol (VITAMIN D3) 25 MCG (1000 UT) tablet Take 1,000 Units by mouth daily.   citalopram (CELEXA) 20 MG tablet TAKE 1 TABLET(20 MG) BY MOUTH DAILY   Emollient (GOLD BOND DIABETICS DRY SKIN) CREA Apply 1 application. topically at bedtime.   furosemide (LASIX) 20 MG tablet Take one tab daily x 3 days as needed for swelling in legs   glucose blood test strip Use to check blood glucose TID   Lancets 28G MISC Use to check blood glucose 4 times daily    levothyroxine (SYNTHROID) 88 MCG tablet TAKE 1 TABLET(88 MCG) BY MOUTH DAILY   Lidocaine 4 % AERO Apply 1 spray topically at bedtime as needed (foot pain).   losartan-hydrochlorothiazide (HYZAAR) 100-25 MG tablet TAKE 1 TABLET BY MOUTH DAILY   meclizine (ANTIVERT) 25 MG tablet Take 1 tablet (25 mg total) by mouth 3 (three) times daily as needed for dizziness.   metFORMIN (GLUCOPHAGE) 500 MG tablet Take one tablet in the morning and two tablets in the evening   nitroGLYCERIN (NITROSTAT) 0.4 MG SL tablet Place 1 tablet (0.4 mg total) under the tongue every 5 (five) minutes as needed for chest pain.   ondansetron (ZOFRAN-ODT) 8 MG disintegrating tablet Take 1 tablet (8 mg total) by mouth every 8 (eight) hours as needed for nausea or vomiting.   pramipexole (MIRAPEX) 1.5 MG tablet TAKE 1 TABLET(1.5 MG) BY MOUTH AT BEDTIME   Prenatal Vit-Fe Fumarate-FA (PRENATAL VITAMIN PO) Take 1 tablet by mouth daily.   vitamin B-12 (CYANOCOBALAMIN) 1000 MCG tablet Take 1,000 mcg by mouth daily.   No facility-administered encounter medications on file as of 01/11/2023.    Allergies (verified) Benadryl [diphenhydramine hcl], Ace inhibitors, Amoxicillin, Aspirin, Codeine, Fish allergy, Hydrocodone, Indomethacin, Pentazocine lactate, Promethazine hcl, Quinine, and Chlorphen-phenyleph-methscop   History: Past Medical History:  Diagnosis Date   Allergy    Anemia    Anxiety    ANXIETY DEPRESSION 09/15/2007   Arthritis    ASTHMA 05/15/2009   Asthma    DIABETES MELLITUS, TYPE II 12/11/2006   DIVERTICULOSIS, COLON 12/11/2006   Edema 01/15/2009   Gout    pt denies; toe was broken   Headache(784.0) 12/11/2006   Hyperlipidemia    Hypertension    HYPERTENSION NEC 08/14/2007   HYPOTHYROIDISM 12/11/2006   PELVIC PAIN, CHRONIC 08/14/2007   Restless leg syndrome    Past Surgical History:  Procedure Laterality Date   ABDOMINAL HYSTERECTOMY     APPENDECTOMY     Bladder Suspension     COLONOSCOPY  10/08/2017    Dr. Marina Goodell   LEFT HEART CATH AND CORONARY ANGIOGRAPHY N/A 09/05/2021   Procedure: LEFT HEART CATH AND CORONARY ANGIOGRAPHY;  Surgeon: Lennette Bihari, MD;  Location: [facility];  Service: Cardiovascular;  Laterality: N/A;   Family History  Problem Relation Age of Onset   COPD Mother        smoker   Diabetes Mother    COPD Father        smoker   Diabetes Father    Diabetes Sister    Diabetes Mellitus II Sister    Diabetes Sister    Diabetes Sister    Diabetes Sister    Lung cancer Nephew 60   Breast cancer Other 64   Colon cancer Neg Hx    Esophageal cancer Neg Hx    Pancreatic cancer Neg Hx    Rectal cancer Neg Hx    Stomach cancer Neg Hx    Social  History   Socioeconomic History   Marital status: Widowed    Spouse name: Not on file   Number of children: Not on file   Years of education: Not on file   Highest education level: 12th grade  Occupational History   Not on file  Tobacco Use   Smoking status: Never   Smokeless tobacco: Never  Vaping Use   Vaping status: Never Used  Substance and Sexual Activity   Alcohol use: Yes    Comment: wine occ   Drug use: No   Sexual activity: Not Currently    Comment: hysterectomy, <5 sexual partners, <16 y/o, no STD  Other Topics Concern   Not on file  Social History Narrative   Not on file   Social Determinants of Health   Financial Resource Strain: Low Risk  (01/11/2023)   Overall Financial Resource Strain (CARDIA)    Difficulty of Paying Living Expenses: Not hard at all  Food Insecurity: No Food Insecurity (01/11/2023)   Hunger Vital Sign    Worried About Running Out of Food in the Last Year: Never true    Ran Out of Food in the Last Year: Never true  Recent Concern: Food Insecurity - Food Insecurity Present (01/07/2023)   Hunger Vital Sign    Worried About Running Out of Food in the Last Year: Sometimes true    Ran Out of Food in the Last Year: Sometimes true  Transportation Needs: No Transportation Needs  (01/11/2023)   PRAPARE - Administrator, Civil Service (Medical): No    Lack of Transportation (Non-Medical): No  Physical Activity: Inactive (01/11/2023)   Exercise Vital Sign    Days of Exercise per Week: 0 days    Minutes of Exercise per Session: 0 min  Stress: No Stress Concern Present (01/11/2023)   [employer] of Occupational Health - Occupational Stress Questionnaire    Feeling of Stress : Not at all  Social Connections: Moderately Integrated (01/11/2023)   Social Connection and Isolation Panel [NHANES]    Frequency of Communication with Friends and Family: More than three times a week    Frequency of Social Gatherings with Friends and Family: More than three times a week    Attends Religious Services: More than 4 times per year    Active Member of [employer] or Organizations: Yes    Attends Banker Meetings: More than 4 times per year    Marital Status: Widowed    Tobacco Counseling Counseling given: Not Answered   Clinical Intake:  Pre-visit preparation completed: Yes  Pain : No/denies pain     BMI - recorded: 31.39 Nutritional Status: BMI > 30  Obese Nutritional Risks: None Diabetes: Yes CBG done?: No Did pt. bring in CBG monitor from home?: No  How often do you need to have someone help you when you read instructions, pamphlets, or other written materials from your doctor or pharmacy?: 1 - Never  Interpreter Needed?: No  Information entered by :: Theresa Mulligan LPN   Activities of Daily Living    01/07/2023    5:50 PM  In your present state of health, do you have any difficulty performing the following activities:  Hearing? 1  Comment No hearing aids pending appt.  Vision? 0  Difficulty concentrating or making decisions? 0  Walking or climbing stairs? 1  Comment Uses a Cane  Dressing or bathing? 0  Doing errands, shopping? 0  Preparing Food and eating ? N  Using the Toilet? N  In the past six months, have you accidently  leaked urine? Y  Comment Wears pad and breifs. Fpllowed by PCP  Do you have problems with loss of bowel control? N  Managing your Medications? N  Managing your Finances? N  Housekeeping or managing your Housekeeping? N    Patient Care Team: Shirline Frees, NP as PCP - General (Family Medicine) Parke Poisson, MD as PCP - Cardiology (Cardiology)  Indicate any recent Medical Services you may have received from other than [facility] providers in the past year (date may be approximate).     Assessment:   This is a routine wellness examination for [place].  Hearing/Vision screen Hearing Screening - Comments:: Hearing difficulties  Pending appt. Vision Screening - Comments:: Wears rx glasses - up to date with routine eye exams with  [facility]   Goals Addressed               This Visit's Progress     Lose weight (pt-stated)        Eat better       Depression Screen    01/11/2023   11:25 AM 01/11/2023   11:16 AM 11/22/2022    8:27 AM 11/02/2022   10:54 AM 08/21/2022    8:28 AM 05/11/2022    9:21 AM 01/04/2022    3:46 PM  PHQ 2/9 Scores  PHQ - 2 Score 0 0 2 0 2 4 2   PHQ- 9 Score 0 0 7  7 13 2     Fall Risk    01/11/2023   11:45 AM 01/07/2023    5:50 PM 11/22/2022    8:27 AM 09/25/2022    3:53 PM 08/21/2022    8:28 AM  Fall Risk   Falls in the past year? 1 1 1 1  0  Number falls in past yr:  1 1 1  0  Injury with Fall?  0 0 0 0  Risk for fall due to :  No Fall Risks History of fall(s) History of fall(s) No Fall Risks  Follow up  Falls prevention discussed Falls evaluation completed Falls evaluation completed Falls evaluation completed    MEDICARE RISK AT HOME: Medicare Risk at Home Any stairs in or around the home?: Yes If so, are there any without handrails?: No Home free of loose throw rugs in walkways, pet beds, electrical cords, etc?: Yes Adequate lighting in your home to reduce risk of falls?: Yes Life alert?: No Use of a cane, walker or w/c?: Yes Grab bars in the  bathroom?: Yes Shower chair or bench in shower?: Yes Elevated toilet seat or a handicapped toilet?: No  TIMED UP AND GO:  Was the test performed?  No    Cognitive Function:        01/11/2023   11:28 AM 01/04/2022    3:53 PM 01/01/2020   10:15 AM  6CIT Screen  What Year? 0 points 0 points 0 points  What month? 0 points 0 points 0 points  What time? 0 points 0 points 0 points  Count back from 20 0 points 0 points 0 points  Months in reverse 0 points 0 points 0 points  Repeat phrase 0 points 0 points 0 points  Total Score 0 points 0 points 0 points    Immunizations Immunization History  Administered Date(s) Administered   Dtap, Unspecified 12/18/2022   Fluad Quad(high Dose 65+) 01/05/2021   Influenza Whole 01/18/2009   Influenza, High Dose Seasonal PF 04/22/2018   Influenza,inj,Quad PF,6+ Mos 05/20/2014,  01/23/2017   Influenza-Unspecified 01/15/2015, 01/17/2022   PFIZER(Purple Top)SARS-COV-2 Vaccination 06/27/2019, 07/21/2019, 03/23/2020, 07/25/2020   Pfizer Covid-19 Vaccine Bivalent Booster 5y-11y 02/14/2021   Pneumococcal Conjugate-13 04/22/2018   Pneumococcal Polysaccharide-23 05/20/2014, 11/03/2019   Td 04/16/2005, 03/20/2012   Unspecified SARS-COV-2 Vaccination 02/22/2021, 01/17/2022   Zoster, Live 05/28/2014    TDAP status: Up to date  Flu Vaccine status: Due, Education has been provided regarding the importance of this vaccine. Advised may receive this vaccine at local pharmacy or Health Dept. Aware to provide a copy of the vaccination record if obtained from local pharmacy or Health Dept. Verbalized acceptance and understanding.  Pneumococcal vaccine status: Up to date  Covid-19 vaccine status: Declined, Education has been provided regarding the importance of this vaccine but patient still declined. Advised may receive this vaccine at local pharmacy or Health Dept.or vaccine clinic. Aware to provide a copy of the vaccination record if obtained from local pharmacy  or Health Dept. Verbalized acceptance and understanding.  Qualifies for Shingles Vaccine? Yes   Zostavax completed No   Shingrix Completed?: No.    Education has been provided regarding the importance of this vaccine. Patient has been advised to call insurance company to determine out of pocket expense if they have not yet received this vaccine. Advised may also receive vaccine at local pharmacy or Health Dept. Verbalized acceptance and understanding.  Screening Tests Health Maintenance  Topic Date Due   Zoster Vaccines- Shingrix (1 of 2) 06[birth year]21/1971   INFLUENZA VACCINE  11/15/2022   COVID-19 Vaccine (7 - 2023-24 season) 12/16/2022   MAMMOGRAM  05/05/2023   Diabetic kidney evaluation - eGFR measurement  05/12/2023   Diabetic kidney evaluation - Urine ACR  05/12/2023   FOOT EXAM  05/12/2023   OPHTHALMOLOGY EXAM  05/16/2023   HEMOGLOBIN A1C  05/25/2023   Medicare Annual Wellness (AWV)  01/11/2024   Colonoscopy  09/20/2027   DTaP/Tdap/Td (4 - Tdap) 12/17/2032   Pneumonia Vaccine 1+ Years old  Completed   DEXA SCAN  Completed   Hepatitis C Screening  Completed   HPV VACCINES  Aged Out    Health Maintenance  Health Maintenance Due  Topic Date Due   Zoster Vaccines- Shingrix (1 of 2) 06/21/1971   INFLUENZA VACCINE  11/15/2022   COVID-19 Vaccine (7 - 2023-24 season) 12/16/2022    Colorectal cancer screening: Type of screening: Colonoscopy. Completed 09/20/22. Repeat every 5 years  Mammogram status: Completed 05/04/22. Repeat every year  Bone Density status: Completed 10/15/18. Results reflect: Bone density results: NORMAL. Repeat every   years.    Additional Screening:  Hepatitis C Screening: does qualify; Completed 06/13/17  Vision Screening: Recommended annual ophthalmology exams for early detection of glaucoma and other disorders of the eye. Is the patient up to date with their annual eye exam?  Yes  Who is the provider or what is the name of the office in which the patient  attends annual eye exams? [facility] If pt is not established with a provider, would they like to be referred to a provider to establish care? No .   Dental Screening: Recommended annual dental exams for proper oral hygiene  Diabetic Foot Exam: Diabetic Foot Exam: Completed 05/11/22  Community Resource Referral / Chronic Care Management:  CRR required this visit?  No   CCM required this visit?  No     Plan:     I have personally reviewed and noted the following in the patient's chart:   Medical and social history Use  of alcohol, tobacco or illicit drugs  Current medications and supplements including opioid prescriptions. Patient is not currently taking opioid prescriptions. Functional ability and status Nutritional status Physical activity Advanced directives List of other physicians Hospitalizations, surgeries, and ER visits in previous 12 months Vitals Screenings to include cognitive, depression, and falls Referrals and appointments  In addition, I have reviewed and discussed with patient certain preventive protocols, quality metrics, and best practice recommendations. A written personalized care plan for preventive services as well as general preventive health recommendations were provided to patient.     Tillie Rung, LPN   [id number]   After Visit Summary: (MyChart) Due to this being a telephonic visit, the after visit summary with patients personalized plan was offered to patient via MyChart   Nurse Notes: None

## 2023-01-15 DIAGNOSIS — Z23 Encounter for immunization: Secondary | ICD-10-CM | POA: Diagnosis not present

## 2023-02-07 ENCOUNTER — Ambulatory Visit: Payer: Medicare Other | Admitting: Adult Health

## 2023-02-07 VITALS — BP 130/72 | HR 78 | Temp 98.2°F | Ht 63.5 in | Wt 181.0 lb

## 2023-02-07 DIAGNOSIS — R5383 Other fatigue: Secondary | ICD-10-CM

## 2023-02-07 DIAGNOSIS — E611 Iron deficiency: Secondary | ICD-10-CM | POA: Diagnosis not present

## 2023-02-07 LAB — CBC WITH DIFFERENTIAL/PLATELET
Basophils Absolute: 0.1 10*3/uL (ref 0.0–0.1)
Basophils Relative: 0.6 % (ref 0.0–3.0)
Eosinophils Absolute: 0.3 10*3/uL (ref 0.0–0.7)
Eosinophils Relative: 3 % (ref 0.0–5.0)
HCT: 36.5 % (ref 36.0–46.0)
Hemoglobin: 11.3 g/dL — ABNORMAL LOW (ref 12.0–15.0)
Lymphocytes Relative: 26.9 % (ref 12.0–46.0)
Lymphs Abs: 2.2 10*3/uL (ref 0.7–4.0)
MCHC: 30.9 g/dL (ref 30.0–36.0)
MCV: 83.9 fL (ref 78.0–100.0)
Monocytes Absolute: 0.6 10*3/uL (ref 0.1–1.0)
Monocytes Relative: 6.8 % (ref 3.0–12.0)
Neutro Abs: 5.2 10*3/uL (ref 1.4–7.7)
Neutrophils Relative %: 62.7 % (ref 43.0–77.0)
Platelets: 301 10*3/uL (ref 150.0–400.0)
RBC: 4.35 Mil/uL (ref 3.87–5.11)
RDW: 17 % — ABNORMAL HIGH (ref 11.5–15.5)
WBC: 8.2 10*3/uL (ref 4.0–10.5)

## 2023-02-07 LAB — IBC + FERRITIN
Ferritin: 11.8 ng/mL (ref 10.0–291.0)
Iron: 32 ug/dL — ABNORMAL LOW (ref 42–145)
Saturation Ratios: 7.8 % — ABNORMAL LOW (ref 20.0–50.0)
TIBC: 411.6 ug/dL (ref 250.0–450.0)
Transferrin: 294 mg/dL (ref 212.0–360.0)

## 2023-02-07 NOTE — Progress Notes (Signed)
Subjective:    Patient ID: [name], female    DOB: 08/02/1950, 72 y.o.   MRN: [id number]  HPI  72 year old female who  has a past medical history of Allergy, Anemia, Anxiety, ANXIETY DEPRESSION (09/15/2007), Arthritis, ASTHMA (05/15/2009), Asthma, DIABETES MELLITUS, TYPE II (12/11/2006), DIVERTICULOSIS, COLON (12/11/2006), Edema (01/15/2009), Gout, Headache(784.0) (12/11/2006), Hyperlipidemia, Hypertension, HYPERTENSION NEC (08/14/2007), HYPOTHYROIDISM (12/11/2006), PELVIC PAIN, CHRONIC (08/14/2007), and Restless leg syndrome.  She presents to the office today for fatigue.  She reports that as of lately she has been feeling chronically fatigued.  She went to give blood and was told that her blood counts were too low so they would not let her donate.  She also reports that she often has to take an afternoon nap, feels exhausted when she wakes up from sleeping and feels as though she is sleeping too much during the day.  She does snore but denies any signs of apnea.  She does not feel as though fatigue is related to depression.  She feels as though she may be overdoing it with her volunteer activities.  Review of Systems See HPI   Past Medical History:  Diagnosis Date   Allergy    Anemia    Anxiety    ANXIETY DEPRESSION 09/15/2007   Arthritis    ASTHMA 05/15/2009   Asthma    DIABETES MELLITUS, TYPE II 12/11/2006   DIVERTICULOSIS, COLON 12/11/2006   Edema 01/15/2009   Gout    pt denies; toe was broken   Headache(784.0) 12/11/2006   Hyperlipidemia    Hypertension    HYPERTENSION NEC 08/14/2007   HYPOTHYROIDISM 12/11/2006   PELVIC PAIN, CHRONIC 08/14/2007   Restless leg syndrome     Social History   Socioeconomic History   Marital status: Widowed    Spouse name: Not on file   Number of children: Not on file   Years of education: Not on file   Highest education level: 12th grade  Occupational History   Not on file  Tobacco Use   Smoking status: Never    Smokeless tobacco: Never  Vaping Use   Vaping status: Never Used  Substance and Sexual Activity   Alcohol use: Yes    Comment: wine occ   Drug use: No   Sexual activity: Not Currently    Comment: hysterectomy, <5 sexual partners, <16 y/o, no STD  Other Topics Concern   Not on file  Social History Narrative   Not on file   Social Determinants of Health   Financial Resource Strain: Low Risk  (02/07/2023)   Overall Financial Resource Strain (CARDIA)    Difficulty of Paying Living Expenses: Not hard at all  Food Insecurity: Food Insecurity Present (02/07/2023)   Hunger Vital Sign    Worried About Running Out of Food in the Last Year: Sometimes true    Ran Out of Food in the Last Year: Sometimes true  Transportation Needs: No Transportation Needs (02/07/2023)   PRAPARE - Administrator, Civil Service (Medical): No    Lack of Transportation (Non-Medical): No  Physical Activity: Insufficiently Active (02/07/2023)   Exercise Vital Sign    Days of Exercise per Week: 5 days    Minutes of Exercise per Session: 10 min  Stress: No Stress Concern Present (02/07/2023)   [employer] of Occupational Health - Occupational Stress Questionnaire    Feeling of Stress : Not at all  Social Connections: Moderately Integrated (02/07/2023)   Social Connection and Isolation Panel [NHANES]  Frequency of Communication with Friends and Family: More than three times a week    Frequency of Social Gatherings with Friends and Family: More than three times a week    Attends Religious Services: More than 4 times per year    Active Member of [employer] or Organizations: Yes    Attends Banker Meetings: More than 4 times per year    Marital Status: Widowed  Intimate Partner Violence: Not At Risk (01/11/2023)   Humiliation, Afraid, Rape, and Kick questionnaire    Fear of Current or Ex-Partner: No    Emotionally Abused: No    Physically Abused: No    Sexually Abused: No    Past  Surgical History:  Procedure Laterality Date   ABDOMINAL HYSTERECTOMY     APPENDECTOMY     Bladder Suspension     COLONOSCOPY  10/08/2017   Dr. Marina Goodell   LEFT HEART CATH AND CORONARY ANGIOGRAPHY N/A 09/05/2021   Procedure: LEFT HEART CATH AND CORONARY ANGIOGRAPHY;  Surgeon: Lennette Bihari, MD;  Location: [facility];  Service: Cardiovascular;  Laterality: N/A;    Family History  Problem Relation Age of Onset   COPD Mother        smoker   Diabetes Mother    COPD Father        smoker   Diabetes Father    Diabetes Sister    Diabetes Mellitus II Sister    Diabetes Sister    Diabetes Sister    Diabetes Sister    Lung cancer Nephew 60   Breast cancer Other 2   Colon cancer Neg Hx    Esophageal cancer Neg Hx    Pancreatic cancer Neg Hx    Rectal cancer Neg Hx    Stomach cancer Neg Hx     Allergies  Allergen Reactions   Benadryl [Diphenhydramine Hcl] Anaphylaxis   Ace Inhibitors Cough   Amoxicillin     Unknown reaction   Aspirin Nausea And Vomiting    Tolerates low dose aspirin    Codeine Nausea And Vomiting   Fish Allergy Nausea And Vomiting   Hydrocodone Nausea And Vomiting   Indomethacin     Unknown reaction    Pentazocine Lactate     passing out   Promethazine Hcl     Unknown reaction    Quinine     itching redface   Chlorphen-Phenyleph-Methscop Rash    Current Outpatient Medications on File Prior to Visit  Medication Sig Dispense Refill   acetaminophen (TYLENOL) 500 MG tablet Take 1,000 mg by mouth every 8 (eight) hours as needed for moderate pain.     albuterol (VENTOLIN HFA) 108 (90 Base) MCG/ACT inhaler Inhale 2 puffs into the lungs every 6 (six) hours as needed for wheezing or shortness of breath. 8 g 0   ALPRAZolam (XANAX) 0.25 MG tablet Take 1 tablet (0.25 mg total) by mouth 2 (two) times daily as needed for anxiety. 60 tablet 0   amLODipine (NORVASC) 5 MG tablet TAKE 1 TABLET(5 MG) BY MOUTH DAILY 90 tablet 3   aspirin 81 MG tablet Take 81 mg by  mouth at bedtime.     atorvastatin (LIPITOR) 10 MG tablet TAKE 1 TABLET(10 MG) BY MOUTH DAILY 90 tablet 3   betamethasone valerate ointment (VALISONE) 0.1 % Apply 1 Application topically 2 (two) times daily. Place in the affected area twice a bid for 2 weeks and then place on the area at night twice weekly. 45 g 0  blood glucose meter kit and supplies KIT Dispense based on patient and insurance preference. Use up to four times daily as directed. 1 each 0   Carboxymethylcellul-Glycerin (LUBRICATING EYE DROPS OP) Place 1 drop into both eyes daily as needed (dry eyes).     cholecalciferol (VITAMIN D3) 25 MCG (1000 UT) tablet Take 1,000 Units by mouth daily.     citalopram (CELEXA) 20 MG tablet TAKE 1 TABLET(20 MG) BY MOUTH DAILY 90 tablet 1   Emollient (GOLD BOND DIABETICS DRY SKIN) CREA Apply 1 application. topically at bedtime.     furosemide (LASIX) 20 MG tablet Take one tab daily x 3 days as needed for swelling in legs 90 tablet 0   glucose blood test strip Use to check blood glucose TID 200 each 0   Lancets 28G MISC Use to check blood glucose 4 times daily 100 each 0   levothyroxine (SYNTHROID) 88 MCG tablet TAKE 1 TABLET(88 MCG) BY MOUTH DAILY 90 tablet 3   Lidocaine 4 % AERO Apply 1 spray topically at bedtime as needed (foot pain).     losartan-hydrochlorothiazide (HYZAAR) 100-25 MG tablet TAKE 1 TABLET BY MOUTH DAILY 90 tablet 3   meclizine (ANTIVERT) 25 MG tablet Take 1 tablet (25 mg total) by mouth 3 (three) times daily as needed for dizziness. 30 tablet 0   metFORMIN (GLUCOPHAGE) 500 MG tablet Take one tablet in the morning and two tablets in the evening 270 tablet 0   ondansetron (ZOFRAN-ODT) 8 MG disintegrating tablet Take 1 tablet (8 mg total) by mouth every 8 (eight) hours as needed for nausea or vomiting. 24 tablet 0   pramipexole (MIRAPEX) 1.5 MG tablet TAKE 1 TABLET(1.5 MG) BY MOUTH AT BEDTIME 90 tablet 1   Prenatal Vit-Fe Fumarate-FA (PRENATAL VITAMIN PO) Take 1 tablet by mouth  daily.     vitamin B-12 (CYANOCOBALAMIN) 1000 MCG tablet Take 1,000 mcg by mouth daily.     nitroGLYCERIN (NITROSTAT) 0.4 MG SL tablet Place 1 tablet (0.4 mg total) under the tongue every 5 (five) minutes as needed for chest pain. 25 tablet 3   No current facility-administered medications on file prior to visit.    BP 130/72   Pulse 78   Temp 98.2 F (36.8 C) (Oral)   Ht 5' 3.5" (1.613 m)   Wt 181 lb (82.1 kg)   SpO2 96%   BMI 31.56 kg/m       Objective:   Physical Exam Vitals and nursing note reviewed.  Constitutional:      Appearance: Normal appearance.  Cardiovascular:     Rate and Rhythm: Normal rate and regular rhythm.     Pulses: Normal pulses.     Heart sounds: Normal heart sounds.  Pulmonary:     Effort: Pulmonary effort is normal.     Breath sounds: Normal breath sounds.  Musculoskeletal:        General: Normal range of motion.  Skin:    General: Skin is warm and dry.  Neurological:     General: No focal deficit present.     Mental Status: She is oriented to person, place, and time.  Psychiatric:        Mood and Affect: Mood normal.        Behavior: Behavior normal.        Thought Content: Thought content normal.        Judgment: Judgment normal.        Assessment & Plan:  1. Other fatigue -Will recheck CBC and  iron levels today.  There is high suspicion for sleep apnea.  Will refer to pulmonary for sleep study - CBC with Differential/Platelet; Future - IBC + Ferritin; Future - Ambulatory referral to Pulmonology - IBC + Ferritin - CBC with Differential/Platelet  2. Iron deficiency  - CBC with Differential/Platelet; Future - IBC + Ferritin; Future - IBC + Ferritin - CBC with Differential/Platelet  Shirline Frees, NP  Time spent with patient today was 32 minutes which consisted of chart review, discussing fatigue and sleep apnea , work up, treatment answering questions and documentation.

## 2023-02-08 ENCOUNTER — Encounter: Payer: Self-pay | Admitting: Adult Health

## 2023-02-21 ENCOUNTER — Encounter: Payer: Self-pay | Admitting: Nurse Practitioner

## 2023-02-21 ENCOUNTER — Ambulatory Visit: Payer: Medicare Other | Admitting: Nurse Practitioner

## 2023-02-21 VITALS — BP 138/72 | HR 71 | Temp 98.1°F | Ht 63.5 in | Wt 180.4 lb

## 2023-02-21 DIAGNOSIS — F4321 Adjustment disorder with depressed mood: Secondary | ICD-10-CM

## 2023-02-21 DIAGNOSIS — E66811 Obesity, class 1: Secondary | ICD-10-CM

## 2023-02-21 DIAGNOSIS — G2581 Restless legs syndrome: Secondary | ICD-10-CM

## 2023-02-21 DIAGNOSIS — G4719 Other hypersomnia: Secondary | ICD-10-CM | POA: Diagnosis not present

## 2023-02-21 NOTE — Progress Notes (Signed)
@Patient  ID: [name], female    DOB: 1950/06/18, 72 y.o.   MRN: [id number]  Chief Complaint  Patient presents with   Consult    Extreme fatigue during day, sleeping a lot during the day.  Lab work was abnormal    Referring provider: Shirline Frees, NP  HPI: 72 year old female, never smoker referred for sleep consult.  Past medical history significant for hypertension, GERD, diabetes, hypothyroid, RLS.  TEST/EVENTS:   02/21/2023: Today-sleep consult Discussed the use of AI scribe software for clinical note transcription with the patient, who gave verbal consent to proceed.  History of Present Illness   The patient was referred for evaluation of chronic fatigue and poor sleep quality. She reports excessive daytime sleepiness and frequent nocturnal awakenings to urinate. These symptoms have been present for several years. The patient's grandson has reported that she snores, but there have been no observed apneas. She occasionally experiences dry mouth, necessitating sips of water throughout the night. The patient has experienced drowsiness while driving, particularly over long distances, necessitating pulling off the road to rest. She has never fallen asleep while driving. Tends to sleep a lot during the day. No morning headaches, sleep parasomnias/paralysis. No history of narcolepsy or cataplexy.   She goes to bed between 10 PM and midnight. Falls asleep quickly.  Wakes 2-3 times a night.  Usually gets up around 6 to 7 AM.  She is retired.  No significant weight change over the last 2 years.  Never had a previous sleep study.  Does not use any oxygen at night. The patient also reports restless leg syndrome, for which she takes pramipexole at night.   She is a never smoker.  No excessive caffeine intake.  She consumes alcohol infrequently, about once or twice a month. The patient is retired. She experienced the loss of her husband about a year ago and more recently a nephew,  which may contribute to her sleep disturbances.  Her late husband was actually a former patient of ours.  Family history of emphysema and heart disease.  Epworth 19      Allergies  Allergen Reactions   Benadryl [Diphenhydramine Hcl] Anaphylaxis   Ace Inhibitors Cough   Amoxicillin     Unknown reaction   Aspirin Nausea And Vomiting    Tolerates low dose aspirin    Codeine Nausea And Vomiting   Fish Allergy Nausea And Vomiting   Hydrocodone Nausea And Vomiting   Indomethacin     Unknown reaction    Pentazocine Lactate     passing out   Promethazine Hcl     Unknown reaction    Quinine     itching redface   Chlorphen-Phenyleph-Methscop Rash    Immunization History  Administered Date(s) Administered   Dtap, Unspecified 12/18/2022   Fluad Quad(high Dose 65+) 01/05/2021   Influenza Whole 01/18/2009   Influenza, High Dose Seasonal PF 04/22/2018   Influenza,inj,Quad PF,6+ Mos 05/20/2014, 01/23/2017   Influenza-Unspecified 01/15/2015, 01/17/2022   PFIZER(Purple Top)SARS-COV-2 Vaccination 06/27/2019, 07/21/2019, 03/23/2020, 07/25/2020   Pfizer Covid-19 Vaccine Bivalent Booster 5y-11y 02/14/2021   Pneumococcal Conjugate-13 04/22/2018   Pneumococcal Polysaccharide-23 05/20/2014, 11/03/2019   Td 04/16/2005, 03/20/2012   Unspecified SARS-COV-2 Vaccination 02/22/2021, 01/17/2022   Zoster, Live 05/28/2014    Past Medical History:  Diagnosis Date   Allergy    Anemia    Anxiety    ANXIETY DEPRESSION 09/15/2007   Arthritis    ASTHMA 05/15/2009   Asthma    DIABETES MELLITUS, TYPE II 12/11/2006  DIVERTICULOSIS, COLON 12/11/2006   Edema 01/15/2009   Gout    pt denies; toe was broken   Headache(784.0) 12/11/2006   Hyperlipidemia    Hypertension    HYPERTENSION NEC 08/14/2007   HYPOTHYROIDISM 12/11/2006   PELVIC PAIN, CHRONIC 08/14/2007   Restless leg syndrome     Tobacco History: Social History   Tobacco Use  Smoking Status Never  Smokeless Tobacco Never    Counseling given: Not Answered   Outpatient Medications Prior to Visit  Medication Sig Dispense Refill   acetaminophen (TYLENOL) 500 MG tablet Take 1,000 mg by mouth every 8 (eight) hours as needed for moderate pain.     albuterol (VENTOLIN HFA) 108 (90 Base) MCG/ACT inhaler Inhale 2 puffs into the lungs every 6 (six) hours as needed for wheezing or shortness of breath. 8 g 0   ALPRAZolam (XANAX) 0.25 MG tablet Take 1 tablet (0.25 mg total) by mouth 2 (two) times daily as needed for anxiety. 60 tablet 0   amLODipine (NORVASC) 5 MG tablet TAKE 1 TABLET(5 MG) BY MOUTH DAILY 90 tablet 3   aspirin 81 MG tablet Take 81 mg by mouth at bedtime.     atorvastatin (LIPITOR) 10 MG tablet TAKE 1 TABLET(10 MG) BY MOUTH DAILY 90 tablet 3   betamethasone valerate ointment (VALISONE) 0.1 % Apply 1 Application topically 2 (two) times daily. Place in the affected area twice a bid for 2 weeks and then place on the area at night twice weekly. 45 g 0   blood glucose meter kit and supplies KIT Dispense based on patient and insurance preference. Use up to four times daily as directed. 1 each 0   Carboxymethylcellul-Glycerin (LUBRICATING EYE DROPS OP) Place 1 drop into both eyes daily as needed (dry eyes).     cholecalciferol (VITAMIN D3) 25 MCG (1000 UT) tablet Take 1,000 Units by mouth daily.     citalopram (CELEXA) 20 MG tablet TAKE 1 TABLET(20 MG) BY MOUTH DAILY 90 tablet 1   Emollient (GOLD BOND DIABETICS DRY SKIN) CREA Apply 1 application. topically at bedtime.     furosemide (LASIX) 20 MG tablet Take one tab daily x 3 days as needed for swelling in legs 90 tablet 0   glucose blood test strip Use to check blood glucose TID 200 each 0   Lancets 28G MISC Use to check blood glucose 4 times daily 100 each 0   levothyroxine (SYNTHROID) 88 MCG tablet TAKE 1 TABLET(88 MCG) BY MOUTH DAILY 90 tablet 3   Lidocaine 4 % AERO Apply 1 spray topically at bedtime as needed (foot pain).     losartan-hydrochlorothiazide  (HYZAAR) 100-25 MG tablet TAKE 1 TABLET BY MOUTH DAILY 90 tablet 3   meclizine (ANTIVERT) 25 MG tablet Take 1 tablet (25 mg total) by mouth 3 (three) times daily as needed for dizziness. 30 tablet 0   metFORMIN (GLUCOPHAGE) 500 MG tablet Take one tablet in the morning and two tablets in the evening 270 tablet 0   ondansetron (ZOFRAN-ODT) 8 MG disintegrating tablet Take 1 tablet (8 mg total) by mouth every 8 (eight) hours as needed for nausea or vomiting. 24 tablet 0   pramipexole (MIRAPEX) 1.5 MG tablet TAKE 1 TABLET(1.5 MG) BY MOUTH AT BEDTIME 90 tablet 1   Prenatal Vit-Fe Fumarate-FA (PRENATAL VITAMIN PO) Take 1 tablet by mouth daily.     vitamin B-12 (CYANOCOBALAMIN) 1000 MCG tablet Take 1,000 mcg by mouth daily.     nitroGLYCERIN (NITROSTAT) 0.4 MG SL tablet Place 1  tablet (0.4 mg total) under the tongue every 5 (five) minutes as needed for chest pain. 25 tablet 3   No facility-administered medications prior to visit.     Review of Systems:   Constitutional: No weight loss or gain, night sweats, fevers, chills, or lassitude.+daytime fatigue  HEENT: No headaches, difficulty swallowing, tooth/dental problems, or sore throat. No sneezing, itching, ear ache, nasal congestion, or post nasal drip CV:  No chest pain, orthopnea, PND, swelling in lower extremities, anasarca, dizziness, palpitations, syncope Resp: +snoring. No shortness of breath with exertion or at rest. No excess mucus or change in color of mucus. No productive or non-productive. No hemoptysis. No wheezing.  No chest wall deformity GI:  No heartburn, indigestion GU: No dysuria, change in color of urine, urgency or daytime frequency.  +nocturia  Skin: No rash, lesions, ulcerations MSK:  No joint pain or swelling.  +RLS; chronic joint stiffness  Neuro: No dizziness or lightheadedness.  Psych: No depression or anxiety. Mood stable. +sleep disturbance     Physical Exam:  BP 138/72 (BP Location: Right Arm, Patient Position:  Sitting, Cuff Size: Normal)   Pulse 71   Temp 98.1 F (36.7 C) (Oral)   Ht 5' 3.5" (1.613 m)   Wt 180 lb 6.4 oz (81.8 kg)   SpO2 95%   BMI 31.46 kg/m   GEN: Pleasant, interactive, well-appearing; obese; in no acute distress HEENT:  Normocephalic and atraumatic. PERRLA. Sclera white. Nasal turbinates pink, moist and patent bilaterally. No rhinorrhea present. Oropharynx pink and moist, without exudate or edema. No lesions, ulcerations, or postnasal drip. Mallampati III NECK:  Supple w/ fair ROM. No JVD present. Normal carotid impulses w/o bruits. Thyroid symmetrical with no goiter or nodules palpated. No lymphadenopathy.   CV: RRR, no m/r/g, no peripheral edema. Pulses intact, +2 bilaterally. No cyanosis, pallor or clubbing. PULMONARY:  Unlabored, regular breathing. Clear bilaterally A&P w/o wheezes/rales/rhonchi. No accessory muscle use.  GI: BS present and normoactive. Soft, non-tender to palpation. No organomegaly or masses detected.  MSK: No erythema, warmth or tenderness. Cap refil <2 sec all extrem. No deformities or joint swelling noted.  Neuro: A/Ox3. No focal deficits noted.   Skin: Warm, no lesions or rashe Psych: Normal affect and behavior. Judgement and thought content appropriate.     Lab Results:  CBC    Component Value Date/Time   WBC 8.2 02/07/2023 1418   RBC 4.35 02/07/2023 1418   HGB 11.3 (L) 02/07/2023 1418   HCT 36.5 02/07/2023 1418   PLT 301.0 02/07/2023 1418   MCV 83.9 02/07/2023 1418   MCH 29.3 11/03/2019 0810   MCHC 30.9 02/07/2023 1418   RDW 17.0 (H) 02/07/2023 1418   LYMPHSABS 2.2 02/07/2023 1418   MONOABS 0.6 02/07/2023 1418   EOSABS 0.3 02/07/2023 1418   BASOSABS 0.1 02/07/2023 1418    BMET    Component Value Date/Time   NA 140 05/11/2022 0929   K 3.9 05/11/2022 0929   CL 101 05/11/2022 0929   CO2 29 05/11/2022 0929   GLUCOSE 134 (H) 05/11/2022 0929   BUN 16 05/11/2022 0929   CREATININE 0.85 05/11/2022 0929   CREATININE 0.86 11/03/2019  0810   CALCIUM 9.3 05/11/2022 0929   GFRNONAA 80.83 09/28/2009 1030   GFRAA 84 06/23/2007 1056    BNP No results found for: "BNP"   Imaging:  No results found.  Administration History     None           No data to display  No results found for: "NITRICOXIDE"      Assessment & Plan:   Excessive daytime sleepiness She has snoring, excessive daytime sleepiness, restless sleep. BMI 31. Given this,  I am concerned she could have sleep disordered breathing with obstructive sleep apnea. She will need sleep study for further evaluation.    - discussed how weight can impact sleep and risk for sleep disordered breathing - discussed options to assist with weight loss: combination of diet modification, cardiovascular and strength training exercises   - had an extensive discussion regarding the adverse health consequences related to untreated sleep disordered breathing - specifically discussed the risks for hypertension, coronary artery disease, cardiac dysrhythmias, cerebrovascular disease, and diabetes - lifestyle modification discussed   - discussed how sleep disruption can increase risk of accidents, particularly when driving - safe driving practices were discussed  Patient Instructions  Given your symptoms, I am concerned that you may have sleep disordered breathing with sleep apnea. You will need a sleep study for further evaluation. Someone will contact you to schedule this.   We discussed how untreated sleep apnea puts an individual at risk for cardiac arrhthymias, pulm HTN, DM, stroke and increases their risk for daytime accidents. We also briefly reviewed treatment options including weight loss, side sleeping position, oral appliance, CPAP therapy or referral to ENT for possible surgical options  Use caution when driving and pull over if you become sleepy.  Follow up in 6-8 weeks with Florentina Addison Roger Fasnacht,NP to go over sleep study results, or sooner, if needed      Restless leg syndrome Underlying sleep disordered breathing possibly a contributing factor.  Continue Mirapex in interim.  Will reassess at follow-up  Obesity (BMI 30.0-34.9) BMI 31.  Healthy weight loss encouraged.  Grief Appropriate grief response to loss of husband and recent loss of nephew.  Possibly contributing to some of her daytime fatigue symptoms.  No SI/HI.  Depression symptoms managed by PCP.   Advised if symptoms do not improve or worsen, to please contact office for sooner follow up or seek emergency care.   I spent 35 minutes of dedicated to the care of this patient on the date of this encounter to include pre-visit review of records, face-to-face time with the patient discussing conditions above, post visit ordering of testing, clinical documentation with the electronic health record, making appropriate referrals as documented, and communicating necessary findings to members of the patients care team.  Noemi Chapel, NP 02/21/2023  Pt aware and understands NP's role.

## 2023-02-21 NOTE — Assessment & Plan Note (Signed)
Underlying sleep disordered breathing possibly a contributing factor.  Continue Mirapex in interim.  Will reassess at follow-up

## 2023-02-21 NOTE — Assessment & Plan Note (Signed)
BMI 31. Healthy weight loss encouraged.  

## 2023-02-21 NOTE — Patient Instructions (Signed)
Given your symptoms, I am concerned that you may have sleep disordered breathing with sleep apnea. You will need a sleep study for further evaluation. Someone will contact you to schedule this.   We discussed how untreated sleep apnea puts an individual at risk for cardiac arrhthymias, pulm HTN, DM, stroke and increases their risk for daytime accidents. We also briefly reviewed treatment options including weight loss, side sleeping position, oral appliance, CPAP therapy or referral to ENT for possible surgical options  Use caution when driving and pull over if you become sleepy.  Follow up in 6-8 weeks with Carol Gerri Acre,NP to go over sleep study results, or sooner, if needed

## 2023-02-21 NOTE — Assessment & Plan Note (Signed)
Appropriate grief response to loss of husband and recent loss of nephew.  Possibly contributing to some of her daytime fatigue symptoms.  No SI/HI.  Depression symptoms managed by PCP.

## 2023-02-21 NOTE — Assessment & Plan Note (Signed)
She has snoring, excessive daytime sleepiness, restless sleep. BMI 31. Given this,  I am concerned she could have sleep disordered breathing with obstructive sleep apnea. She will need sleep study for further evaluation.    - discussed how weight can impact sleep and risk for sleep disordered breathing - discussed options to assist with weight loss: combination of diet modification, cardiovascular and strength training exercises   - had an extensive discussion regarding the adverse health consequences related to untreated sleep disordered breathing - specifically discussed the risks for hypertension, coronary artery disease, cardiac dysrhythmias, cerebrovascular disease, and diabetes - lifestyle modification discussed   - discussed how sleep disruption can increase risk of accidents, particularly when driving - safe driving practices were discussed  Patient Instructions  Given your symptoms, I am concerned that you may have sleep disordered breathing with sleep apnea. You will need a sleep study for further evaluation. Someone will contact you to schedule this.   We discussed how untreated sleep apnea puts an individual at risk for cardiac arrhthymias, pulm HTN, DM, stroke and increases their risk for daytime accidents. We also briefly reviewed treatment options including weight loss, side sleeping position, oral appliance, CPAP therapy or referral to ENT for possible surgical options  Use caution when driving and pull over if you become sleepy.  Follow up in 6-8 weeks with [name],NP to go over sleep study results, or sooner, if needed

## 2023-02-22 ENCOUNTER — Ambulatory Visit (INDEPENDENT_AMBULATORY_CARE_PROVIDER_SITE_OTHER): Payer: Medicare Other | Admitting: Adult Health

## 2023-02-22 VITALS — BP 110/70 | HR 58 | Temp 98.2°F | Ht 63.5 in | Wt 179.0 lb

## 2023-02-22 DIAGNOSIS — Z7984 Long term (current) use of oral hypoglycemic drugs: Secondary | ICD-10-CM | POA: Diagnosis not present

## 2023-02-22 DIAGNOSIS — I10 Essential (primary) hypertension: Secondary | ICD-10-CM

## 2023-02-22 DIAGNOSIS — E119 Type 2 diabetes mellitus without complications: Secondary | ICD-10-CM | POA: Diagnosis not present

## 2023-02-22 LAB — POCT GLYCOSYLATED HEMOGLOBIN (HGB A1C): Hemoglobin A1C: 6.9 % — AB (ref 4.0–5.6)

## 2023-02-22 NOTE — Patient Instructions (Signed)
It was great seeing you today   Your A1c improved to 6.9   I will see you back in 3 months for your physical exam

## 2023-02-22 NOTE — Progress Notes (Signed)
Subjective:    Patient ID: [name], female    DOB: 06-01-1950, 72 y.o.   MRN: [id number]  HPI 72 year old female who  has a past medical history of Allergy, Anemia, Anxiety, ANXIETY DEPRESSION (09/15/2007), Arthritis, ASTHMA (05/15/2009), Asthma, DIABETES MELLITUS, TYPE II (12/11/2006), DIVERTICULOSIS, COLON (12/11/2006), Edema (01/15/2009), Gout, Headache(784.0) (12/11/2006), Hyperlipidemia, Hypertension, HYPERTENSION NEC (08/14/2007), HYPOTHYROIDISM (12/11/2006), PELVIC PAIN, CHRONIC (08/14/2007), and Restless leg syndrome.  DM -currently prescribed metformin 500 mg in the morning and 1000 mg in the evening  She does not check blood sugar routinely.   Lab Results  Component Value Date   HGBA1C 7.0 (A) 11/22/2022   HGBA1C 6.8 (A) 08/21/2022   HGBA1C 7.4 (H) 05/11/2022    Wt Readings from Last 3 Encounters:  02/22/23 179 lb (81.2 kg)  02/21/23 180 lb 6.4 oz (81.8 kg)  02/07/23 181 lb (82.1 kg)   Esssential hypertension-takes Hyzaar 100-25 mg daily.  She denies chest pain, shortness of breath, dizziness, lightheadedness, or headaches.  Blood pressures at home have been in the 120s to high 140s over 60s to 80s.  BP Readings from Last 3 Encounters:  02/22/23 110/70  02/21/23 138/72  02/07/23 130/72   Review of Systems See HPI   Past Medical History:  Diagnosis Date   Allergy    Anemia    Anxiety    ANXIETY DEPRESSION 09/15/2007   Arthritis    ASTHMA 05/15/2009   Asthma    DIABETES MELLITUS, TYPE II 12/11/2006   DIVERTICULOSIS, COLON 12/11/2006   Edema 01/15/2009   Gout    pt denies; toe was broken   Headache(784.0) 12/11/2006   Hyperlipidemia    Hypertension    HYPERTENSION NEC 08/14/2007   HYPOTHYROIDISM 12/11/2006   PELVIC PAIN, CHRONIC 08/14/2007   Restless leg syndrome     Social History   Socioeconomic History   Marital status: Widowed    Spouse name: Not on file   Number of children: Not on file   Years of education: Not on file    Highest education level: 12th grade  Occupational History   Not on file  Tobacco Use   Smoking status: Never   Smokeless tobacco: Never  Vaping Use   Vaping status: Never Used  Substance and Sexual Activity   Alcohol use: Yes    Comment: wine occ   Drug use: No   Sexual activity: Not Currently    Comment: hysterectomy, <5 sexual partners, <18 y/o, no STD  Other Topics Concern   Not on file  Social History Narrative   Not on file   Social Determinants of Health   Financial Resource Strain: Low Risk  (02/07/2023)   Overall Financial Resource Strain (CARDIA)    Difficulty of Paying Living Expenses: Not hard at all  Food Insecurity: Food Insecurity Present (02/07/2023)   Hunger Vital Sign    Worried About Running Out of Food in the Last Year: Sometimes true    Ran Out of Food in the Last Year: Sometimes true  Transportation Needs: No Transportation Needs (02/07/2023)   PRAPARE - Administrator, Civil Service (Medical): No    Lack of Transportation (Non-Medical): No  Physical Activity: Insufficiently Active (02/07/2023)   Exercise Vital Sign    Days of Exercise per Week: 5 days    Minutes of Exercise per Session: 10 min  Stress: No Stress Concern Present (02/07/2023)   [employer] of Occupational Health - Occupational Stress Questionnaire    Feeling of Stress :  Not at all  Social Connections: Moderately Integrated (02/07/2023)   Social Connection and Isolation Panel [NHANES]    Frequency of Communication with Friends and Family: More than three times a week    Frequency of Social Gatherings with Friends and Family: More than three times a week    Attends Religious Services: More than 4 times per year    Active Member of [employer] or Organizations: Yes    Attends Banker Meetings: More than 4 times per year    Marital Status: Widowed  Intimate Partner Violence: Not At Risk (01/11/2023)   Humiliation, Afraid, Rape, and Kick questionnaire    Fear of  Current or Ex-Partner: No    Emotionally Abused: No    Physically Abused: No    Sexually Abused: No    Past Surgical History:  Procedure Laterality Date   ABDOMINAL HYSTERECTOMY     APPENDECTOMY     Bladder Suspension     COLONOSCOPY  10/08/2017   Dr. Marina Goodell   LEFT HEART CATH AND CORONARY ANGIOGRAPHY N/A 09/05/2021   Procedure: LEFT HEART CATH AND CORONARY ANGIOGRAPHY;  Surgeon: Lennette Bihari, MD;  Location: [facility] INVASIVE CV [facility];  Service: Cardiovascular;  Laterality: N/A;    Family History  Problem Relation Age of Onset   COPD Mother        smoker   Diabetes Mother    COPD Father        smoker   Diabetes Father    Diabetes Sister    Diabetes Mellitus II Sister    Diabetes Sister    Diabetes Sister    Diabetes Sister    Lung cancer Nephew 60   Breast cancer Other 16   Colon cancer Neg Hx    Esophageal cancer Neg Hx    Pancreatic cancer Neg Hx    Rectal cancer Neg Hx    Stomach cancer Neg Hx     Allergies  Allergen Reactions   Benadryl [Diphenhydramine Hcl] Anaphylaxis   Ace Inhibitors Cough   Amoxicillin     Unknown reaction   Aspirin Nausea And Vomiting    Tolerates low dose aspirin    Codeine Nausea And Vomiting   Fish Allergy Nausea And Vomiting   Hydrocodone Nausea And Vomiting   Indomethacin     Unknown reaction    Pentazocine Lactate     passing out   Promethazine Hcl     Unknown reaction    Quinine     itching redface   Chlorphen-Phenyleph-Methscop Rash    Current Outpatient Medications on File Prior to Visit  Medication Sig Dispense Refill   acetaminophen (TYLENOL) 500 MG tablet Take 1,000 mg by mouth every 8 (eight) hours as needed for moderate pain.     albuterol (VENTOLIN HFA) 108 (90 Base) MCG/ACT inhaler Inhale 2 puffs into the lungs every 6 (six) hours as needed for wheezing or shortness of breath. 8 g 0   ALPRAZolam (XANAX) 0.25 MG tablet Take 1 tablet (0.25 mg total) by mouth 2 (two) times daily as needed for anxiety. 60 tablet 0    amLODipine (NORVASC) 5 MG tablet TAKE 1 TABLET(5 MG) BY MOUTH DAILY 90 tablet 3   aspirin 81 MG tablet Take 81 mg by mouth at bedtime.     atorvastatin (LIPITOR) 10 MG tablet TAKE 1 TABLET(10 MG) BY MOUTH DAILY 90 tablet 3   betamethasone valerate ointment (VALISONE) 0.1 % Apply 1 Application topically 2 (two) times daily. Place in the affected area twice  a bid for 2 weeks and then place on the area at night twice weekly. 45 g 0   blood glucose meter kit and supplies KIT Dispense based on patient and insurance preference. Use up to four times daily as directed. 1 each 0   Carboxymethylcellul-Glycerin (LUBRICATING EYE DROPS OP) Place 1 drop into both eyes daily as needed (dry eyes).     cholecalciferol (VITAMIN D3) 25 MCG (1000 UT) tablet Take 1,000 Units by mouth daily.     citalopram (CELEXA) 20 MG tablet TAKE 1 TABLET(20 MG) BY MOUTH DAILY 90 tablet 1   Emollient (GOLD BOND DIABETICS DRY SKIN) CREA Apply 1 application. topically at bedtime.     furosemide (LASIX) 20 MG tablet Take one tab daily x 3 days as needed for swelling in legs 90 tablet 0   glucose blood test strip Use to check blood glucose TID 200 each 0   Lancets 28G MISC Use to check blood glucose 4 times daily 100 each 0   levothyroxine (SYNTHROID) 88 MCG tablet TAKE 1 TABLET(88 MCG) BY MOUTH DAILY 90 tablet 3   Lidocaine 4 % AERO Apply 1 spray topically at bedtime as needed (foot pain).     losartan-hydrochlorothiazide (HYZAAR) 100-25 MG tablet TAKE 1 TABLET BY MOUTH DAILY 90 tablet 3   meclizine (ANTIVERT) 25 MG tablet Take 1 tablet (25 mg total) by mouth 3 (three) times daily as needed for dizziness. 30 tablet 0   metFORMIN (GLUCOPHAGE) 500 MG tablet Take one tablet in the morning and two tablets in the evening 270 tablet 0   ondansetron (ZOFRAN-ODT) 8 MG disintegrating tablet Take 1 tablet (8 mg total) by mouth every 8 (eight) hours as needed for nausea or vomiting. 24 tablet 0   pramipexole (MIRAPEX) 1.5 MG tablet TAKE 1  TABLET(1.5 MG) BY MOUTH AT BEDTIME 90 tablet 1   Prenatal Vit-Fe Fumarate-FA (PRENATAL VITAMIN PO) Take 1 tablet by mouth daily.     vitamin B-12 (CYANOCOBALAMIN) 1000 MCG tablet Take 1,000 mcg by mouth daily.     nitroGLYCERIN (NITROSTAT) 0.4 MG SL tablet Place 1 tablet (0.4 mg total) under the tongue every 5 (five) minutes as needed for chest pain. 25 tablet 3   No current facility-administered medications on file prior to visit.    BP 110/70   Pulse (!) 58   Temp 98.2 F (36.8 C) (Oral)   Ht 5' 3.5" (1.613 m)   Wt 179 lb (81.2 kg)   SpO2 97%   BMI 31.21 kg/m       Objective:   Physical Exam Vitals and nursing note reviewed.  Constitutional:      Appearance: Normal appearance. She is obese.  Cardiovascular:     Rate and Rhythm: Normal rate and regular rhythm.     Pulses: Normal pulses.     Heart sounds: Normal heart sounds.  Pulmonary:     Effort: Pulmonary effort is normal.     Breath sounds: Normal breath sounds.  Skin:    General: Skin is warm and dry.  Neurological:     General: No focal deficit present.     Mental Status: She is alert and oriented to person, place, and time.  Psychiatric:        Mood and Affect: Mood normal.        Behavior: Behavior normal.        Thought Content: Thought content normal.        Judgment: Judgment normal.  Assessment & Plan:  1. Diabetes mellitus treated with oral medication (HCC)  - POC HgB A1c; Future- 6.9 - has improved slightly.  - Continue with Metformin dosing  - Follow up in 3 months for CPE   2. Essential hypertension - Well controlled.  - No change in medication   Shirline Frees, NP

## 2023-02-27 ENCOUNTER — Other Ambulatory Visit: Payer: Self-pay | Admitting: Adult Health

## 2023-02-27 DIAGNOSIS — G4733 Obstructive sleep apnea (adult) (pediatric): Secondary | ICD-10-CM | POA: Diagnosis not present

## 2023-02-27 DIAGNOSIS — E118 Type 2 diabetes mellitus with unspecified complications: Secondary | ICD-10-CM

## 2023-02-27 DIAGNOSIS — G4719 Other hypersomnia: Secondary | ICD-10-CM

## 2023-03-17 DIAGNOSIS — J208 Acute bronchitis due to other specified organisms: Secondary | ICD-10-CM | POA: Diagnosis not present

## 2023-03-17 DIAGNOSIS — I10 Essential (primary) hypertension: Secondary | ICD-10-CM | POA: Diagnosis not present

## 2023-03-17 DIAGNOSIS — R079 Chest pain, unspecified: Secondary | ICD-10-CM | POA: Diagnosis not present

## 2023-03-17 DIAGNOSIS — R0602 Shortness of breath: Secondary | ICD-10-CM | POA: Diagnosis not present

## 2023-03-17 DIAGNOSIS — J189 Pneumonia, unspecified organism: Secondary | ICD-10-CM | POA: Diagnosis not present

## 2023-03-17 DIAGNOSIS — G2581 Restless legs syndrome: Secondary | ICD-10-CM | POA: Diagnosis not present

## 2023-03-17 DIAGNOSIS — J9601 Acute respiratory failure with hypoxia: Secondary | ICD-10-CM | POA: Diagnosis not present

## 2023-03-17 DIAGNOSIS — J168 Pneumonia due to other specified infectious organisms: Secondary | ICD-10-CM | POA: Diagnosis not present

## 2023-03-17 DIAGNOSIS — E079 Disorder of thyroid, unspecified: Secondary | ICD-10-CM | POA: Diagnosis not present

## 2023-03-17 DIAGNOSIS — R509 Fever, unspecified: Secondary | ICD-10-CM | POA: Diagnosis not present

## 2023-03-17 DIAGNOSIS — R059 Cough, unspecified: Secondary | ICD-10-CM | POA: Diagnosis not present

## 2023-03-17 DIAGNOSIS — J069 Acute upper respiratory infection, unspecified: Secondary | ICD-10-CM | POA: Diagnosis not present

## 2023-03-17 DIAGNOSIS — E119 Type 2 diabetes mellitus without complications: Secondary | ICD-10-CM | POA: Diagnosis not present

## 2023-03-22 ENCOUNTER — Encounter: Payer: Self-pay | Admitting: Adult Health

## 2023-03-22 ENCOUNTER — Ambulatory Visit (INDEPENDENT_AMBULATORY_CARE_PROVIDER_SITE_OTHER): Payer: Medicare Other | Admitting: Adult Health

## 2023-03-22 VITALS — BP 140/66 | HR 72 | Temp 97.7°F | Ht 63.5 in | Wt 176.5 lb

## 2023-03-22 DIAGNOSIS — J988 Other specified respiratory disorders: Secondary | ICD-10-CM | POA: Diagnosis not present

## 2023-03-22 DIAGNOSIS — R300 Dysuria: Secondary | ICD-10-CM

## 2023-03-22 LAB — URINALYSIS
Bilirubin Urine: NEGATIVE
Hgb urine dipstick: NEGATIVE
Ketones, ur: NEGATIVE
Leukocytes,Ua: NEGATIVE
Nitrite: NEGATIVE
Specific Gravity, Urine: 1.025 (ref 1.000–1.030)
Total Protein, Urine: NEGATIVE
Urine Glucose: NEGATIVE
Urobilinogen, UA: 0.2 (ref 0.0–1.0)
pH: 6 (ref 5.0–8.0)

## 2023-03-22 MED ORDER — AZITHROMYCIN 250 MG PO TABS: ORAL_TABLET | ORAL | 0 refills | Status: AC

## 2023-03-22 MED ORDER — HYDROCODONE BIT-HOMATROP MBR 5-1.5 MG/5ML PO SOLN: 5.0000 mL | Freq: Three times a day (TID) | ORAL | 0 refills | Status: DC | PRN

## 2023-03-22 NOTE — Progress Notes (Signed)
Subjective:    Patient ID: [name], female    DOB: 19-Feb-1951, 72 y.o.   MRN: [id number]  HPI 72 year old female who  has a past medical history of Allergy, Anemia, Anxiety, ANXIETY DEPRESSION (09/15/2007), Arthritis, ASTHMA (05/15/2009), Asthma, DIABETES MELLITUS, TYPE II (12/11/2006), DIVERTICULOSIS, COLON (12/11/2006), Edema (01/15/2009), Gout, Headache(784.0) (12/11/2006), Hyperlipidemia, Hypertension, HYPERTENSION NEC (08/14/2007), HYPOTHYROIDISM (12/11/2006), PELVIC PAIN, CHRONIC (08/14/2007), and Restless leg syndrome.  She is being evaluated today for follow-up after being seen in the emergency room 5 days ago.  She presented to the emergency room with cough x 3 days.  Her cough is worse at nighttime.  She was also complaining of a runny nose.  She had a leukocytosis on lab work.  X-ray of the chest did not show any infiltrates.  Her cough was not productive.  She was diagnosed with viral URI with cough and viral bronchitis.  She was given short course of oral steroids, cough suppressant, and albuterol inhaler  Today she reports that she continues to have shortness of breath, wheezing and has developed a productive cough with white mucus. She has not had any fevers recently. She has finished her steroids and has not had any improvement. She does find the inhaler helpful.   She also reports that over the last 24 hours she started to experience dysuria. She denies other symptoms.    Review of Systems See HPI   Past Medical History:  Diagnosis Date   Allergy    Anemia    Anxiety    ANXIETY DEPRESSION 09/15/2007   Arthritis    ASTHMA 05/15/2009   Asthma    DIABETES MELLITUS, TYPE II 12/11/2006   DIVERTICULOSIS, COLON 12/11/2006   Edema 01/15/2009   Gout    pt denies; toe was broken   Headache(784.0) 12/11/2006   Hyperlipidemia    Hypertension    HYPERTENSION NEC 08/14/2007   HYPOTHYROIDISM 12/11/2006   PELVIC PAIN, CHRONIC 08/14/2007   Restless leg  syndrome     Social History   Socioeconomic History   Marital status: Widowed    Spouse name: Not on file   Number of children: Not on file   Years of education: Not on file   Highest education level: 12th grade  Occupational History   Not on file  Tobacco Use   Smoking status: Never   Smokeless tobacco: Never  Vaping Use   Vaping status: Never Used  Substance and Sexual Activity   Alcohol use: Yes    Comment: wine occ   Drug use: No   Sexual activity: Not Currently    Comment: hysterectomy, <5 sexual partners, <18 y/o, no STD  Other Topics Concern   Not on file  Social History Narrative   Not on file   Social Determinants of Health   Financial Resource Strain: Low Risk  (02/07/2023)   Overall Financial Resource Strain (CARDIA)    Difficulty of Paying Living Expenses: Not hard at all  Food Insecurity: Food Insecurity Present (02/07/2023)   Hunger Vital Sign    Worried About Running Out of Food in the Last Year: Sometimes true    Ran Out of Food in the Last Year: Sometimes true  Transportation Needs: No Transportation Needs (02/07/2023)   PRAPARE - Administrator, Civil Service (Medical): No    Lack of Transportation (Non-Medical): No  Physical Activity: Insufficiently Active (02/07/2023)   Exercise Vital Sign    Days of Exercise per Week: 5 days  Minutes of Exercise per Session: 10 min  Stress: No Stress Concern Present (02/07/2023)   [employer] of Occupational Health - Occupational Stress Questionnaire    Feeling of Stress : Not at all  Social Connections: Moderately Integrated (02/07/2023)   Social Connection and Isolation Panel [NHANES]    Frequency of Communication with Friends and Family: More than three times a week    Frequency of Social Gatherings with Friends and Family: More than three times a week    Attends Religious Services: More than 4 times per year    Active Member of [employer] or Organizations: Yes    Attends Tax inspector Meetings: More than 4 times per year    Marital Status: Widowed  Intimate Partner Violence: Not At Risk (03/17/2023)   Received from [facility]   HITS    Over the last 12 months how often did your partner physically hurt you?: Never    Over the last 12 months how often did your partner insult you or talk down to you?: Never    Over the last 12 months how often did your partner threaten you with physical harm?: Never    Over the last 12 months how often did your partner scream or curse at you?: Never    Past Surgical History:  Procedure Laterality Date   ABDOMINAL HYSTERECTOMY     APPENDECTOMY     Bladder Suspension     COLONOSCOPY  10/08/2017   Dr. Marina Goodell   LEFT HEART CATH AND CORONARY ANGIOGRAPHY N/A 09/05/2021   Procedure: LEFT HEART CATH AND CORONARY ANGIOGRAPHY;  Surgeon: Lennette Bihari, MD;  Location: [facility] INVASIVE CV LAB;  Service: Cardiovascular;  Laterality: N/A;    Family History  Problem Relation Age of Onset   COPD Mother        smoker   Diabetes Mother    COPD Father        smoker   Diabetes Father    Diabetes Sister    Diabetes Mellitus II Sister    Diabetes Sister    Diabetes Sister    Diabetes Sister    Lung cancer Nephew 60   Breast cancer Other 75   Colon cancer Neg Hx    Esophageal cancer Neg Hx    Pancreatic cancer Neg Hx    Rectal cancer Neg Hx    Stomach cancer Neg Hx     Allergies  Allergen Reactions   Benadryl [Diphenhydramine Hcl] Anaphylaxis   Ace Inhibitors Cough   Amoxicillin     Unknown reaction   Aspirin Nausea And Vomiting    Tolerates low dose aspirin    Codeine Nausea And Vomiting   Fish Allergy Nausea And Vomiting   Hydrocodone Nausea And Vomiting   Indomethacin     Unknown reaction    Pentazocine Lactate     passing out   Promethazine Hcl     Unknown reaction    Quinine     itching redface   Chlorphen-Phenyleph-Methscop Rash    Current Outpatient Medications on File Prior to Visit  Medication Sig  Dispense Refill   albuterol (VENTOLIN HFA) 108 (90 Base) MCG/ACT inhaler Inhale 2 puffs into the lungs every 6 (six) hours as needed for wheezing or shortness of breath. 8 g 0   amLODipine (NORVASC) 5 MG tablet TAKE 1 TABLET(5 MG) BY MOUTH DAILY 90 tablet 3   aspirin 81 MG tablet Take 81 mg by mouth at bedtime.     atorvastatin (LIPITOR) 10 MG  tablet TAKE 1 TABLET(10 MG) BY MOUTH DAILY 90 tablet 3   betamethasone valerate ointment (VALISONE) 0.1 % Apply 1 Application topically 2 (two) times daily. Place in the affected area twice a bid for 2 weeks and then place on the area at night twice weekly. 45 g 0   blood glucose meter kit and supplies KIT Dispense based on patient and insurance preference. Use up to four times daily as directed. 1 each 0   Carboxymethylcellul-Glycerin (LUBRICATING EYE DROPS OP) Place 1 drop into both eyes daily as needed (dry eyes).     cholecalciferol (VITAMIN D3) 25 MCG (1000 UT) tablet Take 1,000 Units by mouth daily.     citalopram (CELEXA) 20 MG tablet TAKE 1 TABLET(20 MG) BY MOUTH DAILY 90 tablet 1   Emollient (GOLD BOND DIABETICS DRY SKIN) CREA Apply 1 application. topically at bedtime.     glucose blood test strip Use to check blood glucose TID 200 each 0   Lancets 28G MISC Use to check blood glucose 4 times daily 100 each 0   levothyroxine (SYNTHROID) 88 MCG tablet TAKE 1 TABLET(88 MCG) BY MOUTH DAILY 90 tablet 3   Lidocaine 4 % AERO Apply 1 spray topically at bedtime as needed (foot pain).     losartan-hydrochlorothiazide (HYZAAR) 100-25 MG tablet TAKE 1 TABLET BY MOUTH DAILY 90 tablet 3   metFORMIN (GLUCOPHAGE) 500 MG tablet TAKE 1 TABLET BY MOUTH IN THE MORNING AND 2 TABLETS IN THE EVENING 270 tablet 0   pramipexole (MIRAPEX) 1.5 MG tablet TAKE 1 TABLET(1.5 MG) BY MOUTH AT BEDTIME 90 tablet 1   vitamin B-12 (CYANOCOBALAMIN) 1000 MCG tablet Take 1,000 mcg by mouth daily.     acetaminophen (TYLENOL) 500 MG tablet Take 1,000 mg by mouth every 8 (eight) hours as  needed for moderate pain. (Patient not taking: Reported on 03/22/2023)     ALPRAZolam (XANAX) 0.25 MG tablet Take 1 tablet (0.25 mg total) by mouth 2 (two) times daily as needed for anxiety. (Patient not taking: Reported on 03/22/2023) 60 tablet 0   furosemide (LASIX) 20 MG tablet Take one tab daily x 3 days as needed for swelling in legs (Patient not taking: Reported on 03/22/2023) 90 tablet 0   meclizine (ANTIVERT) 25 MG tablet Take 1 tablet (25 mg total) by mouth 3 (three) times daily as needed for dizziness. (Patient not taking: Reported on 03/22/2023) 30 tablet 0   nitroGLYCERIN (NITROSTAT) 0.4 MG SL tablet Place 1 tablet (0.4 mg total) under the tongue every 5 (five) minutes as needed for chest pain. 25 tablet 3   ondansetron (ZOFRAN-ODT) 8 MG disintegrating tablet Take 1 tablet (8 mg total) by mouth every 8 (eight) hours as needed for nausea or vomiting. (Patient not taking: Reported on 03/22/2023) 24 tablet 0   Prenatal Vit-Fe Fumarate-FA (PRENATAL VITAMIN PO) Take 1 tablet by mouth daily. (Patient not taking: Reported on 03/22/2023)     No current facility-administered medications on file prior to visit.    BP (!) 140/66 (BP Location: Left Arm, Patient Position: Sitting, Cuff Size: Large)   Pulse 72   Temp 97.7 F (36.5 C) (Oral)   Ht 5' 3.5" (1.613 m)   Wt 176 lb 8 oz (80.1 kg)   SpO2 98%   BMI 30.78 kg/m       Objective:   Physical Exam Vitals and nursing note reviewed.  Constitutional:      Appearance: Normal appearance. She is ill-appearing.  Cardiovascular:     Rate and  Rhythm: Normal rate and regular rhythm.     Pulses: Normal pulses.     Heart sounds: Normal heart sounds.  Pulmonary:     Effort: Pulmonary effort is normal.     Breath sounds: Wheezing present.  Musculoskeletal:        General: Normal range of motion.  Skin:    General: Skin is warm and dry.     Capillary Refill: Capillary refill takes less than 2 seconds.  Neurological:     General: No focal deficit  present.     Mental Status: She is alert and oriented to person, place, and time.  Psychiatric:        Mood and Affect: Mood normal.        Behavior: Behavior normal.        Thought Content: Thought content normal.        Judgment: Judgment normal.       Assessment & Plan:   1. Respiratory infection -Since she has developed a productive cough and looks acutely ill we will send in azithromycin to cover for bacterial infection.  Will also send Hycodan cough syrup.  Does know that this medication may make her sleepy - azithromycin (ZITHROMAX) 250 MG tablet; Take 2 tablets on day 1, then 1 tablet daily on days 2 through 5  Dispense: 6 tablet; Refill: 0 - HYDROcodone bit-homatropine (HYCODAN) 5-1.5 MG/5ML syrup; Take 5 mLs by mouth every 8 (eight) hours as needed for cough.  Dispense: 120 mL; Refill: 0  2. Dysuria  - Urine Culture; Future - Urinalysis; Future  Shirline Frees, NP

## 2023-03-24 LAB — URINE CULTURE
MICRO NUMBER:: 15819149
SPECIMEN QUALITY:: ADEQUATE

## 2023-03-26 ENCOUNTER — Other Ambulatory Visit: Payer: Self-pay | Admitting: Adult Health

## 2023-03-26 MED ORDER — CIPROFLOXACIN HCL 500 MG PO TABS: 500.0000 mg | ORAL_TABLET | Freq: Two times a day (BID) | ORAL | 0 refills | Status: AC

## 2023-03-27 ENCOUNTER — Encounter: Payer: Self-pay | Admitting: Adult Health

## 2023-03-27 DIAGNOSIS — S199XXA Unspecified injury of neck, initial encounter: Secondary | ICD-10-CM | POA: Diagnosis not present

## 2023-03-27 DIAGNOSIS — S060X0A Concussion without loss of consciousness, initial encounter: Secondary | ICD-10-CM | POA: Diagnosis not present

## 2023-03-27 DIAGNOSIS — M47812 Spondylosis without myelopathy or radiculopathy, cervical region: Secondary | ICD-10-CM | POA: Diagnosis not present

## 2023-03-27 DIAGNOSIS — M25561 Pain in right knee: Secondary | ICD-10-CM | POA: Diagnosis not present

## 2023-03-27 DIAGNOSIS — Z043 Encounter for examination and observation following other accident: Secondary | ICD-10-CM | POA: Diagnosis not present

## 2023-03-27 DIAGNOSIS — W01198A Fall on same level from slipping, tripping and stumbling with subsequent striking against other object, initial encounter: Secondary | ICD-10-CM | POA: Diagnosis not present

## 2023-03-27 NOTE — Telephone Encounter (Signed)
Spoke to pt and she stated that she was having SOB. Pt also stated that she fell today outside and hit her head on the hardwood deck. Pt claims she has a knot in the middle of her forehead. Pt daughter is about to take her to the ED. I advised pt to keep [place] updated.

## 2023-03-28 ENCOUNTER — Telehealth: Payer: Self-pay

## 2023-03-28 NOTE — Transitions of Care (Post Inpatient/ED Visit) (Signed)
03/28/2023  Name: [name] MRN: [id number] DOB: 10/08/1950  Today's TOC FU Call Status: Today's TOC FU Call Status:: Successful TOC FU Call Completed TOC FU Call Complete Date: 03/28/23 Patient's Name and Date of Birth confirmed.  Transition Care Management Follow-up Telephone Call Date of Discharge: 03/27/23 Discharge Facility: Other ([facility]) Name of Other ([facility]) [facility]charge Facility: [facility] Type of Discharge: Emergency Department Reason for ED Visit: Other: (fall) How have you been since you were released from the hospital?: Better Any questions or concerns?: No  Items Reviewed: Did you receive and understand the discharge instructions provided?: Yes Medications obtained,verified, and reconciled?: Yes (Medications Reviewed) Any new allergies since your discharge?: No Dietary orders reviewed?: Yes Do you have support at home?: Yes People in Home: child(ren), adult  Medications Reviewed Today: Medications Reviewed Today     Reviewed by Karena Addison, LPN (Licensed Practical Nurse) on 03/28/23 at 1241  Med List Status: <None>   Medication Order Taking? Sig Documenting Provider Last Dose Status Informant  acetaminophen (TYLENOL) 500 MG tablet [id number] No Take 1,000 mg by mouth every 8 (eight) hours as needed for moderate pain.  Patient not taking: Reported on 03/22/2023   [provider] Not Taking Active Self  albuterol (VENTOLIN HFA) 108 (90 Base) MCG/ACT inhaler [id number] No Inhale 2 puffs into the lungs every 6 (six) hours as needed for wheezing or shortness of breath. [place], Betty G, MD Taking Active   ALPRAZolam Prudy Feeler) 0.25 MG tablet [id number] No Take 1 tablet (0.25 mg total) by mouth 2 (two) times daily as needed for anxiety.  Patient not taking: Reported on 03/22/2023   Shirline Frees, NP Not Taking Active Self  amLODipine (NORVASC) 5 MG tablet [id number] No TAKE 1 TABLET(5 MG) BY MOUTH DAILY Parke Poisson, MD  Taking Active   aspirin 81 MG tablet [id number] No Take 81 mg by mouth at bedtime. [provider] Taking Active Self  atorvastatin (LIPITOR) 10 MG tablet [id number] No TAKE 1 TABLET(10 MG) BY MOUTH DAILY Nafziger, Kandee Keen, NP Taking Active   betamethasone valerate ointment (VALISONE) 0.1 % [id number] No Apply 1 Application topically 2 (two) times daily. Place in the affected area twice a bid for 2 weeks and then place on the area at night twice weekly. Patton Salles, MD Taking Active   blood glucose meter kit and supplies KIT [id number] No Dispense based on patient and insurance preference. Use up to four times daily as directed. Nafziger, Kandee Keen, NP Taking Active Self  Carboxymethylcellul-Glycerin (LUBRICATING EYE DROPS OP) [id number] No Place 1 drop into both eyes daily as needed (dry eyes). [provider] Taking Active Self  cholecalciferol (VITAMIN D3) 25 MCG (1000 UT) tablet [id number] No Take 1,000 Units by mouth daily. [provider] Taking Active Self  ciprofloxacin (CIPRO) 500 MG tablet [id number]  Take 1 tablet (500 mg total) by mouth 2 (two) times daily for 3 days. Nafziger, Kandee Keen, NP  Active   citalopram (CELEXA) 20 MG tablet [id number] No TAKE 1 TABLET(20 MG) BY MOUTH DAILY Nafziger, Kandee Keen, NP Taking Active   Emollient (GOLD BOND DIABETICS DRY SKIN) CREA [id number] No Apply 1 application. topically at bedtime. [provider] Taking Active Self  furosemide (LASIX) 20 MG tablet [id number] No Take one tab daily x 3 days as needed for swelling in legs  Patient not taking: Reported on 03/22/2023   Shirline Frees, NP Not Taking Active   glucose blood test strip [id number] No Use to check blood glucose  TID Shirline Frees, NP Taking Active Self  HYDROcodone bit-homatropine (HYCODAN) 5-1.5 MG/5ML syrup [id number]  Take 5 mLs by mouth every 8 (eight) hours as needed for cough. Shirline Frees, NP  Active   Lancets 28G MISC [id number] No Use to check blood glucose 4  times daily Nafziger, Kandee Keen, NP Taking Active Self  levothyroxine (SYNTHROID) 88 MCG tablet [id number] No TAKE 1 TABLET(88 MCG) BY MOUTH DAILY Nafziger, Kandee Keen, NP Taking Active   Lidocaine 4 % AERO [id number] No Apply 1 spray topically at bedtime as needed (foot pain). [provider] Taking Active Self  losartan-hydrochlorothiazide (HYZAAR) 100-25 MG tablet [id number] No TAKE 1 TABLET BY MOUTH DAILY Nafziger, Kandee Keen, NP Taking Active   meclizine (ANTIVERT) 25 MG tablet [id number] No Take 1 tablet (25 mg total) by mouth 3 (three) times daily as needed for dizziness.  Patient not taking: Reported on 03/22/2023   Trevor Iha, FNP Not Taking Active   metFORMIN (GLUCOPHAGE) 500 MG tablet [id number] No TAKE 1 TABLET BY MOUTH IN THE MORNING AND 2 TABLETS IN THE EVENING Nafziger, Kandee Keen, NP Taking Active   nitroGLYCERIN (NITROSTAT) 0.4 MG SL tablet [id number] No Place 1 tablet (0.4 mg total) under the tongue every 5 (five) minutes as needed for chest pain. Parke Poisson, MD Unknown Expired 08/21/22 2359 Self           Med Note Limmie Patricia, BRIDGET N   Tue Sep 05, 2021  7:22 AM)    ondansetron (ZOFRAN-ODT) 8 MG disintegrating tablet [id number] No Take 1 tablet (8 mg total) by mouth every 8 (eight) hours as needed for nausea or vomiting.  Patient not taking: Reported on 03/22/2023   Trevor Iha, FNP Not Taking Active   pramipexole (MIRAPEX) 1.5 MG tablet [id number] No TAKE 1 TABLET(1.5 MG) BY MOUTH AT BEDTIME Nafziger, Kandee Keen, NP Taking Active   Prenatal Vit-Fe Fumarate-FA (PRENATAL VITAMIN PO) [id number] No Take 1 tablet by mouth daily.  Patient not taking: Reported on 03/22/2023   [provider] Not Taking Active   vitamin B-12 (CYANOCOBALAMIN) 1000 MCG tablet [id number] No Take 1,000 mcg by mouth daily. [provider] Taking Active Self            Home Care and Equipment/Supplies: Were Home Health Services Ordered?: NA Any new equipment or medical supplies ordered?:  NA  Functional Questionnaire: Do you need assistance with bathing/showering or dressing?: No Do you need assistance with meal preparation?: No Do you need assistance with eating?: No Do you have difficulty maintaining continence: No Do you need assistance with getting out of bed/getting out of a chair/moving?: No Do you have difficulty managing or taking your medications?: No  Follow up appointments reviewed: PCP Follow-up appointment confirmed?: Yes Date of PCP follow-up appointment?: 04/02/23 Follow-up Provider: [facility] Follow-up appointment confirmed?: NA Do you need transportation to your follow-up appointment?: No Do you understand care options if your condition(s) worsen?: Yes-patient verbalized understanding    SIGNATURE Karena Addison, LPN [facility] Nurse Health Advisor Direct Dial [phone]

## 2023-04-02 ENCOUNTER — Encounter: Payer: Self-pay | Admitting: Adult Health

## 2023-04-02 ENCOUNTER — Ambulatory Visit (INDEPENDENT_AMBULATORY_CARE_PROVIDER_SITE_OTHER): Payer: Medicare Other | Admitting: Adult Health

## 2023-04-02 VITALS — BP 120/70 | HR 79 | Temp 98.1°F | Ht 63.5 in | Wt 179.0 lb

## 2023-04-02 DIAGNOSIS — S8001XA Contusion of right knee, initial encounter: Secondary | ICD-10-CM

## 2023-04-02 DIAGNOSIS — S060X0D Concussion without loss of consciousness, subsequent encounter: Secondary | ICD-10-CM | POA: Diagnosis not present

## 2023-04-02 NOTE — Progress Notes (Signed)
Subjective:    Patient ID: [name], female    DOB: [birth year], 72 y.o.   MRN: [id number]  HPI 72 year old female who  has a past medical history of Allergy, Anemia, Anxiety, ANXIETY DEPRESSION (09/15/2007), Arthritis, ASTHMA (05/15/2009), Asthma, DIABETES MELLITUS, TYPE II (12/11/2006), DIVERTICULOSIS, COLON (12/11/2006), Edema (01/15/2009), Gout, Headache(784.0) (12/11/2006), Hyperlipidemia, Hypertension, HYPERTENSION NEC (08/14/2007), HYPOTHYROIDISM (12/11/2006), PELVIC PAIN, CHRONIC (08/14/2007), and Restless leg syndrome.  She presents to the office today for follow up after being seen in the ER at [facility]. She reports slipping while walking up wet wooden stairs at home, hitting her head and right knee. There was no LOC   In the ER her XR of right knee showed no fracture or dislocation. XR of chest showed no acute cardiopulmonary issues.   CT of head and cervical spine showed: Head: Calvarium/Skull base: No acute findings.  Mastoids and middle ears grossly clear Paranasal sinuses: Left maxillary sinus mucosal thickening. Brain: No evidence of acute abnormality.  No significant white matter disease.  No evidence of acute infarct, mass lesion, acute hemorrhage or  hydrocephalus.  Cervical spine: - Prevertebral soft tissues within normal limits. - No evidence for acute fracture or subluxation. - Multilevel degenerative disc disease and facet arthrosis.  Posterior disc osteophyte complex at C6-C7 resulting in mild canal stenosis.   She was diagnosed with likely concussion and knee contusion.  She was prescribed a short course of tramadol to take as needed.  Today she reports that she continues to have a headache, facial pain, and right knee pain.  She denies visual disturbance, nausea, or vomiting.  She tried taking tramadol but this caused her nose to itch.    Review of Systems See HPI  Past Medical History:  Diagnosis Date   Allergy    Anemia    Anxiety     ANXIETY DEPRESSION 09/15/2007   Arthritis    ASTHMA 05/15/2009   Asthma    DIABETES MELLITUS, TYPE II 12/11/2006   DIVERTICULOSIS, COLON 12/11/2006   Edema 01/15/2009   Gout    pt denies; toe was broken   Headache(784.0) 12/11/2006   Hyperlipidemia    Hypertension    HYPERTENSION NEC 08/14/2007   HYPOTHYROIDISM 12/11/2006   PELVIC PAIN, CHRONIC 08/14/2007   Restless leg syndrome     Social History   Socioeconomic History   Marital status: Widowed    Spouse name: Not on file   Number of children: Not on file   Years of education: Not on file   Highest education level: 12th grade  Occupational History   Not on file  Tobacco Use   Smoking status: Never   Smokeless tobacco: Never  Vaping Use   Vaping status: Never Used  Substance and Sexual Activity   Alcohol use: Yes    Comment: wine occ   Drug use: No   Sexual activity: Not Currently    Comment: hysterectomy, <5 sexual partners, <16 y/o, no STD  Other Topics Concern   Not on file  Social History Narrative   Not on file   Social Drivers of Health   Financial Resource Strain: Low Risk  (02/07/2023)   Overall Financial Resource Strain (CARDIA)    Difficulty of Paying Living Expenses: Not hard at all  Food Insecurity: Food Insecurity Present (02/07/2023)   Hunger Vital Sign    Worried About Running Out of Food in the Last Year: Sometimes true    Ran Out of Food in the Last Year: Sometimes true  Transportation Needs: No Transportation Needs (02/07/2023)   PRAPARE - Administrator, Civil Service (Medical): No    Lack of Transportation (Non-Medical): No  Physical Activity: Insufficiently Active (02/07/2023)   Exercise Vital Sign    Days of Exercise per Week: 5 days    Minutes of Exercise per Session: 10 min  Stress: No Stress Concern Present (02/07/2023)   [employer] of Occupational Health - Occupational Stress Questionnaire    Feeling of Stress : Not at all  Social Connections: Moderately  Integrated (02/07/2023)   Social Connection and Isolation Panel [NHANES]    Frequency of Communication with Friends and Family: More than three times a week    Frequency of Social Gatherings with Friends and Family: More than three times a week    Attends Religious Services: More than 4 times per year    Active Member of [employer] or Organizations: Yes    Attends Banker Meetings: More than 4 times per year    Marital Status: Widowed  Intimate Partner Violence: Not At Risk (03/27/2023)   Received from [facility]   HITS    Over the last 12 months how often did your partner physically hurt you?: Never    Over the last 12 months how often did your partner insult you or talk down to you?: Never    Over the last 12 months how often did your partner threaten you with physical harm?: Never    Over the last 12 months how often did your partner scream or curse at you?: Never    Past Surgical History:  Procedure Laterality Date   ABDOMINAL HYSTERECTOMY     APPENDECTOMY     Bladder Suspension     COLONOSCOPY  10/08/2017   Dr. Marina Goodell   LEFT HEART CATH AND CORONARY ANGIOGRAPHY N/A 09/05/2021   Procedure: LEFT HEART CATH AND CORONARY ANGIOGRAPHY;  Surgeon: Lennette Bihari, MD;  Location: [facility] INVASIVE CV LAB;  Service: Cardiovascular;  Laterality: N/A;    Family History  Problem Relation Age of Onset   COPD Mother        smoker   Diabetes Mother    COPD Father        smoker   Diabetes Father    Diabetes Sister    Diabetes Mellitus II Sister    Diabetes Sister    Diabetes Sister    Diabetes Sister    Lung cancer Nephew 60   Breast cancer Other 44   Colon cancer Neg Hx    Esophageal cancer Neg Hx    Pancreatic cancer Neg Hx    Rectal cancer Neg Hx    Stomach cancer Neg Hx     Allergies  Allergen Reactions   Benadryl [Diphenhydramine Hcl] Anaphylaxis   Ace Inhibitors Cough   Amoxicillin     Unknown reaction   Aspirin Nausea And Vomiting    Tolerates low dose aspirin     Codeine Nausea And Vomiting   Fish Allergy Nausea And Vomiting   Hydrocodone Nausea And Vomiting   Indomethacin     Unknown reaction    Pentazocine Lactate     passing out   Promethazine Hcl     Unknown reaction    Quinine     itching redface   Chlorphen-Phenyleph-Methscop Rash    Current Outpatient Medications on File Prior to Visit  Medication Sig Dispense Refill   acetaminophen (TYLENOL) 500 MG tablet Take 1,000 mg by mouth every 8 (eight) hours as needed  for moderate pain (pain score 4-6).     albuterol (VENTOLIN HFA) 108 (90 Base) MCG/ACT inhaler Inhale 2 puffs into the lungs every 6 (six) hours as needed for wheezing or shortness of breath. 8 g 0   ALPRAZolam (XANAX) 0.25 MG tablet Take 1 tablet (0.25 mg total) by mouth 2 (two) times daily as needed for anxiety. 60 tablet 0   amLODipine (NORVASC) 5 MG tablet TAKE 1 TABLET(5 MG) BY MOUTH DAILY 90 tablet 3   aspirin 81 MG tablet Take 81 mg by mouth at bedtime.     atorvastatin (LIPITOR) 10 MG tablet TAKE 1 TABLET(10 MG) BY MOUTH DAILY 90 tablet 3   betamethasone valerate ointment (VALISONE) 0.1 % Apply 1 Application topically 2 (two) times daily. Place in the affected area twice a bid for 2 weeks and then place on the area at night twice weekly. 45 g 0   blood glucose meter kit and supplies KIT Dispense based on patient and insurance preference. Use up to four times daily as directed. 1 each 0   Carboxymethylcellul-Glycerin (LUBRICATING EYE DROPS OP) Place 1 drop into both eyes daily as needed (dry eyes).     cholecalciferol (VITAMIN D3) 25 MCG (1000 UT) tablet Take 1,000 Units by mouth daily.     citalopram (CELEXA) 20 MG tablet TAKE 1 TABLET(20 MG) BY MOUTH DAILY 90 tablet 1   Emollient (GOLD BOND DIABETICS DRY SKIN) CREA Apply 1 application. topically at bedtime.     furosemide (LASIX) 20 MG tablet Take one tab daily x 3 days as needed for swelling in legs 90 tablet 0   glucose blood test strip Use to check blood glucose TID  200 each 0   HYDROcodone bit-homatropine (HYCODAN) 5-1.5 MG/5ML syrup Take 5 mLs by mouth every 8 (eight) hours as needed for cough. 120 mL 0   Lancets 28G MISC Use to check blood glucose 4 times daily 100 each 0   levothyroxine (SYNTHROID) 88 MCG tablet TAKE 1 TABLET(88 MCG) BY MOUTH DAILY 90 tablet 3   Lidocaine 4 % AERO Apply 1 spray topically at bedtime as needed (foot pain).     losartan-hydrochlorothiazide (HYZAAR) 100-25 MG tablet TAKE 1 TABLET BY MOUTH DAILY 90 tablet 3   meclizine (ANTIVERT) 25 MG tablet Take 1 tablet (25 mg total) by mouth 3 (three) times daily as needed for dizziness. 30 tablet 0   metFORMIN (GLUCOPHAGE) 500 MG tablet TAKE 1 TABLET BY MOUTH IN THE MORNING AND 2 TABLETS IN THE EVENING 270 tablet 0   ondansetron (ZOFRAN-ODT) 8 MG disintegrating tablet Take 1 tablet (8 mg total) by mouth every 8 (eight) hours as needed for nausea or vomiting. 24 tablet 0   pramipexole (MIRAPEX) 1.5 MG tablet TAKE 1 TABLET(1.5 MG) BY MOUTH AT BEDTIME 90 tablet 1   Prenatal Vit-Fe Fumarate-FA (PRENATAL VITAMIN PO) Take 1 tablet by mouth daily.     traMADol (ULTRAM) 50 MG tablet Take by mouth.     vitamin B-12 (CYANOCOBALAMIN) 1000 MCG tablet Take 1,000 mcg by mouth daily.     nitroGLYCERIN (NITROSTAT) 0.4 MG SL tablet Place 1 tablet (0.4 mg total) under the tongue every 5 (five) minutes as needed for chest pain. 25 tablet 3   No current facility-administered medications on file prior to visit.    BP 120/70   Pulse 79   Temp 98.1 F (36.7 C) (Oral)   Ht 5' 3.5" (1.613 m)   Wt 179 lb (81.2 kg)   SpO2 98%  BMI 31.21 kg/m       Objective:   Physical Exam Vitals and nursing note reviewed.  Constitutional:      Appearance: Normal appearance.  Cardiovascular:     Rate and Rhythm: Normal rate and regular rhythm.     Pulses: Normal pulses.     Heart sounds: Normal heart sounds.  Pulmonary:     Effort: Pulmonary effort is normal.     Breath sounds: Normal breath sounds.   Musculoskeletal:        General: Normal range of motion.  Skin:    General: Skin is warm and dry.     Findings: Bruising (under both eyes, across bridge of nose and on left forearm) present.       Neurological:     General: No focal deficit present.     Mental Status: She is alert and oriented to person, place, and time.  Psychiatric:        Mood and Affect: Mood normal.        Behavior: Behavior normal.        Thought Content: Thought content normal.        Judgment: Judgment normal.       Assessment & Plan:   1. Concussion without loss of consciousness, subsequent encounter (Primary) -Discussed getting plenty of rest and refraining from excessive screen time - Can take OTC pain medications  - Follow up if symptoms not improving in two weeks   2. Contusion of right knee, initial encounter - OTC pain medications - bear weight as tolerated  Shirline Frees, NP  Time spent with patient today was 32 minutes which consisted of chart review, discussing concussions and knee pain work up, treatment answering questions and documentation.

## 2023-04-29 ENCOUNTER — Ambulatory Visit: Payer: Medicare Other | Admitting: Nurse Practitioner

## 2023-04-29 ENCOUNTER — Encounter: Payer: Self-pay | Admitting: Nurse Practitioner

## 2023-04-29 VITALS — BP 128/60 | HR 76 | Resp 16 | Ht 63.5 in | Wt 179.6 lb

## 2023-04-29 DIAGNOSIS — G4733 Obstructive sleep apnea (adult) (pediatric): Secondary | ICD-10-CM | POA: Diagnosis not present

## 2023-04-29 DIAGNOSIS — Z6831 Body mass index (BMI) 31.0-31.9, adult: Secondary | ICD-10-CM

## 2023-04-29 DIAGNOSIS — E66811 Obesity, class 1: Secondary | ICD-10-CM | POA: Diagnosis not present

## 2023-04-29 NOTE — Assessment & Plan Note (Signed)
 Mild OSA with AHI 11.7/h. Minimal apnea burden throughout the night. Reviewed risks of untreated mild OSA and potential treatment options. She declined CPAP therapy or oral appliance. Opted for positional sleeping and healthy weight loss measures. Aware of safe driving practices. She will notify us  if symptoms worsen or she opts for alternative therapy.   Patient Instructions  Your sleep study showed mild sleep apnea. You only had one episode where you stopped breathing entirely. Mild sleep apnea has minimal cardiovascular health risks. We discussed how untreated sleep apnea puts an individual at risk for cardiac arrhthymias, pulm HTN, DM, stroke and increases their risk for daytime accidents. We also briefly reviewed treatment options including weight loss, side sleeping position, oral appliance, CPAP therapy. You did not want to do CPAP or an oral appliance. Recommend you focus on positional sleeping and weight management   Follow up with us  as needed if you feel like your symptoms get worse or become more troublesome

## 2023-04-29 NOTE — Patient Instructions (Signed)
 Your sleep study showed mild sleep apnea. You only had one episode where you stopped breathing entirely. Mild sleep apnea has minimal cardiovascular health risks. We discussed how untreated sleep apnea puts an individual at risk for cardiac arrhthymias, pulm HTN, DM, stroke and increases their risk for daytime accidents. We also briefly reviewed treatment options including weight loss, side sleeping position, oral appliance, CPAP therapy. You did not want to do CPAP or an oral appliance. Recommend you focus on positional sleeping and weight management   Follow up with us  as needed if you feel like your symptoms get worse or become more troublesome

## 2023-04-29 NOTE — Progress Notes (Signed)
 @Patient  ID: [name], female    DOB: October 31, 1950, 73 y.o.   MRN: [id number]  Chief Complaint  Patient presents with   Follow-up    She did home sleep study. Home sleep study revealed mild sleep apnea    Referring provider: Merna Huxley, NP  HPI: 73 year old female, never smoker referred for sleep consult November 2024 with Angelos Wasco NP.  Past medical history significant for hypertension, GERD, diabetes, hypothyroid, RLS.  TEST/EVENTS:  02/27/2023 HST: AHI 11.7, SpO2 low 84%  02/21/2023: OV with Alfard Cochrane NP  The patient was referred for evaluation of chronic fatigue and poor sleep quality. She reports excessive daytime sleepiness and frequent nocturnal awakenings to urinate. These symptoms have been present for several years. The patient's grandson has reported that she snores, but there have been no observed apneas. She occasionally experiences dry mouth, necessitating sips of water throughout the night. The patient has experienced drowsiness while driving, particularly over long distances, necessitating pulling off the road to rest. She has never fallen asleep while driving. Tends to sleep a lot during the day. No morning headaches, sleep parasomnias/paralysis. No history of narcolepsy or cataplexy.  She goes to bed between 10 PM and midnight. Falls asleep quickly.  Wakes 2-3 times a night.  Usually gets up around 6 to 7 AM.  She is retired.  No significant weight change over the last 2 years.  Never had a previous sleep study.  Does not use any oxygen at night. The patient also reports restless leg syndrome, for which she takes pramipexole  at night.  She is a never smoker.  No excessive caffeine intake.  She consumes alcohol infrequently, about once or twice a month. The patient is retired. She experienced the loss of her husband about a year ago and more recently a nephew, which may contribute to her sleep disturbances.  Her late husband was actually a former patient of ours.  Family  history of emphysema and heart disease. Epworth 19    04/29/2023: Today - follow up Patient presents today for follow up to discuss home sleep study results, which revealed mild sleep apnea. She feels unchanged compared to our last visit. She has some daytime fatigue and nocturia. She denies any drowsy driving or morning headaches. She is here to discuss treatment options.   Allergies  Allergen Reactions   Benadryl [Diphenhydramine Hcl] Anaphylaxis   Ace Inhibitors Cough   Amoxicillin     Unknown reaction   Aspirin  Nausea And Vomiting    Tolerates low dose aspirin     Codeine Nausea And Vomiting   Fish Allergy Nausea And Vomiting   Hydrocodone  Nausea And Vomiting   Indomethacin     Unknown reaction    Pentazocine Lactate     passing out   Promethazine Hcl     Unknown reaction    Quinine     itching redface   Chlorphen-Phenyleph-Methscop Rash    Immunization History  Administered Date(s) Administered   Dtap, Unspecified 12/18/2022   Fluad Quad(high Dose 65+) 01/05/2021   Influenza Whole 01/18/2009   Influenza, High Dose Seasonal PF 04/22/2018   Influenza,inj,Quad PF,6+ Mos 05/20/2014, 01/23/2017   Influenza-Unspecified 01/15/2015, 01/17/2022, 01/15/2023   PFIZER(Purple Top)SARS-COV-2 Vaccination 06/27/2019, 07/21/2019, 03/23/2020, 07/25/2020   Pfizer Covid-19 Vaccine Bivalent Booster 5y-11y 02/14/2021   Pneumococcal Conjugate-13 04/22/2018   Pneumococcal Polysaccharide-23 05/20/2014, 11/03/2019   Td 04/16/2005, 03/20/2012   Unspecified SARS-COV-2 Vaccination 02/22/2021, 01/17/2022   Zoster, Live 05/28/2014    Past Medical History:  Diagnosis Date  Allergy    Anemia    Anxiety    ANXIETY DEPRESSION 09/15/2007   Arthritis    ASTHMA 05/15/2009   Asthma    DIABETES MELLITUS, TYPE II 12/11/2006   DIVERTICULOSIS, COLON 12/11/2006   Edema 01/15/2009   Gout    pt denies; toe was broken   Headache(784.0) 12/11/2006   Hyperlipidemia    Hypertension    HYPERTENSION  NEC 08/14/2007   HYPOTHYROIDISM 12/11/2006   PELVIC PAIN, CHRONIC 08/14/2007   Restless leg syndrome     Tobacco History: Social History   Tobacco Use  Smoking Status Never  Smokeless Tobacco Never   Counseling given: Not Answered   Outpatient Medications Prior to Visit  Medication Sig Dispense Refill   acetaminophen  (TYLENOL ) 500 MG tablet Take 1,000 mg by mouth every 8 (eight) hours as needed for moderate pain (pain score 4-6).     albuterol  (VENTOLIN  HFA) 108 (90 Base) MCG/ACT inhaler Inhale 2 puffs into the lungs every 6 (six) hours as needed for wheezing or shortness of breath. 8 g 0   amLODipine  (NORVASC ) 5 MG tablet TAKE 1 TABLET(5 MG) BY MOUTH DAILY 90 tablet 3   aspirin  81 MG tablet Take 81 mg by mouth at bedtime.     atorvastatin  (LIPITOR) 10 MG tablet TAKE 1 TABLET(10 MG) BY MOUTH DAILY 90 tablet 3   betamethasone  valerate ointment (VALISONE ) 0.1 % Apply 1 Application topically 2 (two) times daily. Place in the affected area twice a bid for 2 weeks and then place on the area at night twice weekly. 45 g 0   blood glucose meter kit and supplies KIT Dispense based on patient and insurance preference. Use up to four times daily as directed. 1 each 0   cholecalciferol (VITAMIN D3) 25 MCG (1000 UT) tablet Take 1,000 Units by mouth daily.     citalopram  (CELEXA ) 20 MG tablet TAKE 1 TABLET(20 MG) BY MOUTH DAILY 90 tablet 1   Emollient (GOLD BOND DIABETICS DRY SKIN) CREA Apply 1 application. topically at bedtime.     furosemide  (LASIX ) 20 MG tablet Take one tab daily x 3 days as needed for swelling in legs 90 tablet 0   glucose blood test strip Use to check blood glucose TID 200 each 0   Lancets 28G MISC Use to check blood glucose 4 times daily 100 each 0   levothyroxine  (SYNTHROID ) 88 MCG tablet TAKE 1 TABLET(88 MCG) BY MOUTH DAILY 90 tablet 3   Lidocaine  4 % AERO Apply 1 spray topically at bedtime as needed (foot pain).     losartan -hydrochlorothiazide  (HYZAAR) 100-25 MG tablet  TAKE 1 TABLET BY MOUTH DAILY 90 tablet 3   metFORMIN  (GLUCOPHAGE ) 500 MG tablet TAKE 1 TABLET BY MOUTH IN THE MORNING AND 2 TABLETS IN THE EVENING 270 tablet 0   pramipexole  (MIRAPEX ) 1.5 MG tablet TAKE 1 TABLET(1.5 MG) BY MOUTH AT BEDTIME 90 tablet 1   vitamin B-12 (CYANOCOBALAMIN ) 1000 MCG tablet Take 1,000 mcg by mouth daily.     ALPRAZolam  (XANAX ) 0.25 MG tablet Take 1 tablet (0.25 mg total) by mouth 2 (two) times daily as needed for anxiety. (Patient not taking: Reported on 04/29/2023) 60 tablet 0   Carboxymethylcellul-Glycerin (LUBRICATING EYE DROPS OP) Place 1 drop into both eyes daily as needed (dry eyes).     HYDROcodone  bit-homatropine (HYCODAN) 5-1.5 MG/5ML syrup Take 5 mLs by mouth every 8 (eight) hours as needed for cough. (Patient not taking: Reported on 04/29/2023) 120 mL 0   meclizine  (ANTIVERT )  25 MG tablet Take 1 tablet (25 mg total) by mouth 3 (three) times daily as needed for dizziness. (Patient not taking: Reported on 04/29/2023) 30 tablet 0   nitroGLYCERIN  (NITROSTAT ) 0.4 MG SL tablet Place 1 tablet (0.4 mg total) under the tongue every 5 (five) minutes as needed for chest pain. (Patient not taking: Reported on 04/29/2023) 25 tablet 3   Prenatal Vit-Fe Fumarate-FA (PRENATAL VITAMIN PO) Take 1 tablet by mouth daily. (Patient not taking: Reported on 04/29/2023)     traMADol (ULTRAM) 50 MG tablet Take by mouth. (Patient not taking: Reported on 04/29/2023)     ondansetron  (ZOFRAN -ODT) 8 MG disintegrating tablet Take 1 tablet (8 mg total) by mouth every 8 (eight) hours as needed for nausea or vomiting. 24 tablet 0   No facility-administered medications prior to visit.     Review of Systems:   Constitutional: No weight loss or gain, night sweats, fevers, chills, or lassitude.+daytime fatigue  HEENT: No headaches, difficulty swallowing, tooth/dental problems, or sore throat. No sneezing, itching, ear ache, nasal congestion, or post nasal drip CV:  No chest pain, orthopnea, PND, swelling  in lower extremities, anasarca, dizziness, palpitations, syncope Resp: +snoring. No shortness of breath with exertion or at rest. No excess mucus or change in color of mucus. No productive or non-productive. No hemoptysis. No wheezing.  No chest wall deformity GI:  No heartburn, indigestion GU: No dysuria, change in color of urine, urgency or daytime frequency.  +nocturia  Skin: No rash, lesions, ulcerations MSK:  No joint pain or swelling.  +RLS; chronic joint stiffness  Neuro: No dizziness or lightheadedness.  Psych: No depression or anxiety. Mood stable. +sleep disturbance     Physical Exam:  BP 128/60   Pulse 76   Resp 16   Ht 5' 3.5 (1.613 m)   Wt 179 lb 9.6 oz (81.5 kg)   SpO2 97%   BMI 31.32 kg/m   GEN: Pleasant, interactive, well-appearing; obese; in no acute distress HEENT:  Normocephalic and atraumatic. PERRLA. Sclera white. Nasal turbinates pink, moist and patent bilaterally. No rhinorrhea present. Oropharynx pink and moist, without exudate or edema. No lesions, ulcerations, or postnasal drip. Mallampati III NECK:  Supple w/ fair ROM. No JVD present. Normal carotid impulses w/o bruits. Thyroid  symmetrical with no goiter or nodules palpated. No lymphadenopathy.   CV: RRR, no m/r/g, no peripheral edema. Pulses intact, +2 bilaterally. No cyanosis, pallor or clubbing. PULMONARY:  Unlabored, regular breathing. Clear bilaterally A&P w/o wheezes/rales/rhonchi. No accessory muscle use.  GI: BS present and normoactive. Soft, non-tender to palpation. No organomegaly or masses detected.  MSK: No erythema, warmth or tenderness. Cap refil <2 sec all extrem. No deformities or joint swelling noted.  Neuro: A/Ox3. No focal deficits noted.   Skin: Warm, no lesions or rashe Psych: Normal affect and behavior. Judgement and thought content appropriate.     Lab Results:  CBC    Component Value Date/Time   WBC 8.2 02/07/2023 1418   RBC 4.35 02/07/2023 1418   HGB 11.3 (L) 02/07/2023  1418   HCT 36.5 02/07/2023 1418   PLT 301.0 02/07/2023 1418   MCV 83.9 02/07/2023 1418   MCH 29.3 11/03/2019 0810   MCHC 30.9 02/07/2023 1418   RDW 17.0 (H) 02/07/2023 1418   LYMPHSABS 2.2 02/07/2023 1418   MONOABS 0.6 02/07/2023 1418   EOSABS 0.3 02/07/2023 1418   BASOSABS 0.1 02/07/2023 1418    BMET    Component Value Date/Time   NA 140 05/11/2022 0929  K 3.9 05/11/2022 0929   CL 101 05/11/2022 0929   CO2 29 05/11/2022 0929   GLUCOSE 134 (H) 05/11/2022 0929   BUN 16 05/11/2022 0929   CREATININE 0.85 05/11/2022 0929   CREATININE 0.86 11/03/2019 0810   CALCIUM  9.3 05/11/2022 0929   GFRNONAA 80.83 09/28/2009 1030   GFRAA 84 06/23/2007 1056    BNP No results found for: BNP   Imaging:  No results found.  Administration History     None           No data to display          No results found for: NITRICOXIDE      Assessment & Plan:   Mild obstructive sleep apnea Mild OSA with AHI 11.7/h. Minimal apnea burden throughout the night. Reviewed risks of untreated mild OSA and potential treatment options. She declined CPAP therapy or oral appliance. Opted for positional sleeping and healthy weight loss measures. Aware of safe driving practices. She will notify us  if symptoms worsen or she opts for alternative therapy.   Patient Instructions  Your sleep study showed mild sleep apnea. You only had one episode where you stopped breathing entirely. Mild sleep apnea has minimal cardiovascular health risks. We discussed how untreated sleep apnea puts an individual at risk for cardiac arrhthymias, pulm HTN, DM, stroke and increases their risk for daytime accidents. We also briefly reviewed treatment options including weight loss, side sleeping position, oral appliance, CPAP therapy. You did not want to do CPAP or an oral appliance. Recommend you focus on positional sleeping and weight management   Follow up with us  as needed if you feel like your symptoms get worse  or become more troublesome    Obesity (BMI 30.0-34.9) BMI 31. Healthy weight loss encouraged   Advised if symptoms do not improve or worsen, to please contact office for sooner follow up or seek emergency care.   I spent 25 minutes of dedicated to the care of this patient on the date of this encounter to include pre-visit review of records, face-to-face time with the patient discussing conditions above, post visit ordering of testing, clinical documentation with the electronic health record, making appropriate referrals as documented, and communicating necessary findings to members of the patients care team.  Comer LULLA Rouleau, NP 04/29/2023  Pt aware and understands NP's role.

## 2023-04-29 NOTE — Assessment & Plan Note (Signed)
BMI 31. Healthy weight loss encouraged.  

## 2023-05-01 ENCOUNTER — Other Ambulatory Visit: Payer: Self-pay | Admitting: Adult Health

## 2023-05-01 DIAGNOSIS — Z1231 Encounter for screening mammogram for malignant neoplasm of breast: Secondary | ICD-10-CM

## 2023-05-03 ENCOUNTER — Ambulatory Visit (INDEPENDENT_AMBULATORY_CARE_PROVIDER_SITE_OTHER): Payer: Medicare Other | Admitting: Adult Health

## 2023-05-03 VITALS — BP 120/60 | HR 83 | Temp 97.6°F | Ht 63.5 in | Wt 179.0 lb

## 2023-05-03 DIAGNOSIS — M25561 Pain in right knee: Secondary | ICD-10-CM

## 2023-05-03 DIAGNOSIS — E118 Type 2 diabetes mellitus with unspecified complications: Secondary | ICD-10-CM | POA: Diagnosis not present

## 2023-05-03 MED ORDER — DICLOFENAC SODIUM 1 % EX GEL
2.0000 g | Freq: Four times a day (QID) | CUTANEOUS | 0 refills | Status: AC
Start: 2023-05-03 — End: ?

## 2023-05-03 NOTE — Progress Notes (Signed)
Subjective:    Patient ID: [name], female    DOB: Jul 23, 1950, 73 y.o.   MRN: [id number]  HPI  72 year old female who  has a past medical history of Allergy, Anemia, Anxiety, ANXIETY DEPRESSION (09/15/2007), Arthritis, ASTHMA (05/15/2009), Asthma, DIABETES MELLITUS, TYPE II (12/11/2006), DIVERTICULOSIS, COLON (12/11/2006), Edema (01/15/2009), Gout, Headache(784.0) (12/11/2006), Hyperlipidemia, Hypertension, HYPERTENSION NEC (08/14/2007), HYPOTHYROIDISM (12/11/2006), PELVIC PAIN, CHRONIC (08/14/2007), and Restless leg syndrome.\  She presents to the office today for chronic right knee pain. Pain has been present for  >than two weeks. She has not been using anything at home. She does report falling onto her left knee about two weeks ago but pain does not seem to be worse than before she fell.   She would also like to a referral to Podiatry for diabetic footcare. She is having a hard time cutting her toenails.    Review of Systems See HPI   Past Medical History:  Diagnosis Date   Allergy    Anemia    Anxiety    ANXIETY DEPRESSION 09/15/2007   Arthritis    ASTHMA 05/15/2009   Asthma    DIABETES MELLITUS, TYPE II 12/11/2006   DIVERTICULOSIS, COLON 12/11/2006   Edema 01/15/2009   Gout    pt denies; toe was broken   Headache(784.0) 12/11/2006   Hyperlipidemia    Hypertension    HYPERTENSION NEC 08/14/2007   HYPOTHYROIDISM 12/11/2006   PELVIC PAIN, CHRONIC 08/14/2007   Restless leg syndrome     Social History   Socioeconomic History   Marital status: Widowed    Spouse name: Not on file   Number of children: Not on file   Years of education: Not on file   Highest education level: Never attended school  Occupational History   Not on file  Tobacco Use   Smoking status: Never   Smokeless tobacco: Never  Vaping Use   Vaping status: Never Used  Substance and Sexual Activity   Alcohol use: Yes    Comment: wine occ   Drug use: No   Sexual activity: Not  Currently    Comment: hysterectomy, <5 sexual partners, <16 y/o, no STD  Other Topics Concern   Not on file  Social History Narrative   Not on file   Social Drivers of Health   Financial Resource Strain: Low Risk  (05/01/2023)   Overall Financial Resource Strain (CARDIA)    Difficulty of Paying Living Expenses: Not very hard  Food Insecurity: Food Insecurity Present (05/01/2023)   Hunger Vital Sign    Worried About Running Out of Food in the Last Year: Sometimes true    Ran Out of Food in the Last Year: Never true  Transportation Needs: No Transportation Needs (05/01/2023)   PRAPARE - Administrator, Civil Service (Medical): No    Lack of Transportation (Non-Medical): No  Physical Activity: Insufficiently Active (05/01/2023)   Exercise Vital Sign    Days of Exercise per Week: 2 days    Minutes of Exercise per Session: 10 min  Stress: No Stress Concern Present (05/01/2023)   [employer] of Occupational Health - Occupational Stress Questionnaire    Feeling of Stress : Only a little  Social Connections: Moderately Integrated (05/01/2023)   Social Connection and Isolation Panel [NHANES]    Frequency of Communication with Friends and Family: More than three times a week    Frequency of Social Gatherings with Friends and Family: More than three times a week  Attends Religious Services: More than 4 times per year    Active Member of Clubs or Organizations: Yes    Attends Banker Meetings: More than 4 times per year    Marital Status: Widowed  Intimate Partner Violence: Not At Risk (03/27/2023)   Received from [facility]   HITS    Over the last 12 months how often did your partner physically hurt you?: Never    Over the last 12 months how often did your partner insult you or talk down to you?: Never    Over the last 12 months how often did your partner threaten you with physical harm?: Never    Over the last 12 months how often did your partner scream or  curse at you?: Never    Past Surgical History:  Procedure Laterality Date   ABDOMINAL HYSTERECTOMY     APPENDECTOMY     Bladder Suspension     COLONOSCOPY  10/08/2017   Dr. Marina Goodell   LEFT HEART CATH AND CORONARY ANGIOGRAPHY N/A 09/05/2021   Procedure: LEFT HEART CATH AND CORONARY ANGIOGRAPHY;  Surgeon: Lennette Bihari, MD;  Location: [facility] INVASIVE CV LAB;  Service: Cardiovascular;  Laterality: N/A;    Family History  Problem Relation Age of Onset   COPD Mother        smoker   Diabetes Mother    COPD Father        smoker   Diabetes Father    Diabetes Sister    Diabetes Mellitus II Sister    Diabetes Sister    Diabetes Sister    Diabetes Sister    Lung cancer Nephew 60   Breast cancer Other 74   Colon cancer Neg Hx    Esophageal cancer Neg Hx    Pancreatic cancer Neg Hx    Rectal cancer Neg Hx    Stomach cancer Neg Hx     Allergies  Allergen Reactions   Benadryl [Diphenhydramine Hcl] Anaphylaxis   Ace Inhibitors Cough   Amoxicillin     Unknown reaction   Aspirin Nausea And Vomiting    Tolerates low dose aspirin    Codeine Nausea And Vomiting   Fish Allergy Nausea And Vomiting   Hydrocodone Nausea And Vomiting   Indomethacin     Unknown reaction    Pentazocine Lactate     passing out   Promethazine Hcl     Unknown reaction    Quinine     itching redface   Chlorphen-Phenyleph-Methscop Rash    Current Outpatient Medications on File Prior to Visit  Medication Sig Dispense Refill   acetaminophen (TYLENOL) 500 MG tablet Take 1,000 mg by mouth every 8 (eight) hours as needed for moderate pain (pain score 4-6).     albuterol (VENTOLIN HFA) 108 (90 Base) MCG/ACT inhaler Inhale 2 puffs into the lungs every 6 (six) hours as needed for wheezing or shortness of breath. 8 g 0   ALPRAZolam (XANAX) 0.25 MG tablet Take 1 tablet (0.25 mg total) by mouth 2 (two) times daily as needed for anxiety. 60 tablet 0   amLODipine (NORVASC) 5 MG tablet TAKE 1 TABLET(5 MG) BY MOUTH DAILY  90 tablet 3   aspirin 81 MG tablet Take 81 mg by mouth at bedtime.     atorvastatin (LIPITOR) 10 MG tablet TAKE 1 TABLET(10 MG) BY MOUTH DAILY 90 tablet 3   betamethasone valerate ointment (VALISONE) 0.1 % Apply 1 Application topically 2 (two) times daily. Place in the affected area twice  a bid for 2 weeks and then place on the area at night twice weekly. 45 g 0   blood glucose meter kit and supplies KIT Dispense based on patient and insurance preference. Use up to four times daily as directed. 1 each 0   Carboxymethylcellul-Glycerin (LUBRICATING EYE DROPS OP) Place 1 drop into both eyes daily as needed (dry eyes).     cholecalciferol (VITAMIN D3) 25 MCG (1000 UT) tablet Take 1,000 Units by mouth daily.     citalopram (CELEXA) 20 MG tablet TAKE 1 TABLET(20 MG) BY MOUTH DAILY 90 tablet 1   Emollient (GOLD BOND DIABETICS DRY SKIN) CREA Apply 1 application. topically at bedtime.     furosemide (LASIX) 20 MG tablet Take one tab daily x 3 days as needed for swelling in legs 90 tablet 0   glucose blood test strip Use to check blood glucose TID 200 each 0   HYDROcodone bit-homatropine (HYCODAN) 5-1.5 MG/5ML syrup Take 5 mLs by mouth every 8 (eight) hours as needed for cough. 120 mL 0   Lancets 28G MISC Use to check blood glucose 4 times daily 100 each 0   levothyroxine (SYNTHROID) 88 MCG tablet TAKE 1 TABLET(88 MCG) BY MOUTH DAILY 90 tablet 3   Lidocaine 4 % AERO Apply 1 spray topically at bedtime as needed (foot pain).     losartan-hydrochlorothiazide (HYZAAR) 100-25 MG tablet TAKE 1 TABLET BY MOUTH DAILY 90 tablet 3   meclizine (ANTIVERT) 25 MG tablet Take 1 tablet (25 mg total) by mouth 3 (three) times daily as needed for dizziness. 30 tablet 0   metFORMIN (GLUCOPHAGE) 500 MG tablet TAKE 1 TABLET BY MOUTH IN THE MORNING AND 2 TABLETS IN THE EVENING 270 tablet 0   pramipexole (MIRAPEX) 1.5 MG tablet TAKE 1 TABLET(1.5 MG) BY MOUTH AT BEDTIME 90 tablet 1   Prenatal Vit-Fe Fumarate-FA (PRENATAL VITAMIN  PO) Take 1 tablet by mouth daily.     traMADol (ULTRAM) 50 MG tablet Take by mouth.     vitamin B-12 (CYANOCOBALAMIN) 1000 MCG tablet Take 1,000 mcg by mouth daily.     nitroGLYCERIN (NITROSTAT) 0.4 MG SL tablet Place 1 tablet (0.4 mg total) under the tongue every 5 (five) minutes as needed for chest pain. (Patient not taking: Reported on 04/29/2023) 25 tablet 3   No current facility-administered medications on file prior to visit.    BP 120/60   Pulse 83   Temp 97.6 F (36.4 C) (Oral)   Ht 5' 3.5" (1.613 m)   Wt 179 lb (81.2 kg)   SpO2 96%   BMI 31.21 kg/m       Objective:   Physical Exam Vitals and nursing note reviewed.  Constitutional:      Appearance: Normal appearance.  Musculoskeletal:        General: Tenderness present. No swelling. Normal range of motion.     Right knee: No swelling, deformity, bony tenderness or crepitus. Normal range of motion. Tenderness present. No medial joint line, lateral joint line, MCL, LCL, ACL, PCL or patellar tendon tenderness. No LCL laxity, MCL laxity, ACL laxity or PCL laxity. Normal alignment, normal meniscus and normal patellar mobility. Normal pulse.       Legs:  Skin:    General: Skin is warm and dry.  Neurological:     General: No focal deficit present.     Mental Status: She is alert and oriented to person, place, and time.  Psychiatric:        Mood and Affect:  Mood normal.        Behavior: Behavior normal.        Thought Content: Thought content normal.        Judgment: Judgment normal.       Assessment & Plan:  1. Acute pain of right knee (Primary) - her pain seems to be located along the patellar tendon. I do not think she has pes anserine bursitis. We will try voltaren gel and ice  - Follow up if not resolving  - diclofenac Sodium (VOLTAREN) 1 % GEL; Apply 2 g topically 4 (four) times daily.  Dispense: 150 g; Refill: 0  2. Controlled type 2 diabetes mellitus with complication, without long-term current use of insulin  (HCC)  - Ambulatory referral to Podiatry  Shirline Frees, NP

## 2023-05-17 ENCOUNTER — Ambulatory Visit: Payer: Medicare Other | Attending: Internal Medicine | Admitting: Internal Medicine

## 2023-05-17 ENCOUNTER — Encounter: Payer: Self-pay | Admitting: Internal Medicine

## 2023-05-17 VITALS — BP 122/62 | HR 68 | Ht 63.0 in | Wt 179.0 lb

## 2023-05-17 DIAGNOSIS — R079 Chest pain, unspecified: Secondary | ICD-10-CM | POA: Diagnosis not present

## 2023-05-17 DIAGNOSIS — R296 Repeated falls: Secondary | ICD-10-CM

## 2023-05-17 DIAGNOSIS — R011 Cardiac murmur, unspecified: Secondary | ICD-10-CM

## 2023-05-17 DIAGNOSIS — G4733 Obstructive sleep apnea (adult) (pediatric): Secondary | ICD-10-CM

## 2023-05-17 DIAGNOSIS — G4719 Other hypersomnia: Secondary | ICD-10-CM

## 2023-05-17 DIAGNOSIS — E781 Pure hyperglyceridemia: Secondary | ICD-10-CM

## 2023-05-17 DIAGNOSIS — I10 Essential (primary) hypertension: Secondary | ICD-10-CM | POA: Diagnosis not present

## 2023-05-17 NOTE — Patient Instructions (Signed)
Medication Instructions:  Continue current medications *If you need a refill on your cardiac medications before your next appointment, please call your pharmacy*   Lab Work: none If you have labs (blood work) drawn today and your tests are completely normal, you will receive your results only by: MyChart Message (if you have MyChart) OR A paper copy in the mail If you have any lab test that is abnormal or we need to change your treatment, we will call you to review the results.   Testing/Procedures: none   Follow-Up: At [facility], you and your health needs are our priority.  As part of our continuing mission to provide you with exceptional heart care, we have created designated Provider Care Teams.  These Care Teams include your primary Cardiologist (physician) and Advanced Practice Providers (APPs -  Physician Assistants and Nurse Practitioners) who all work together to provide you with the care you need, when you need it.  We recommend signing up for the patient portal called "MyChart".  Sign up information is provided on this After Visit Summary.  MyChart is used to connect with patients for Virtual Visits (Telemedicine).  Patients are able to view lab/test results, encounter notes, upcoming appointments, etc.  Non-urgent messages can be sent to your provider as well.   To learn more about what you can do with MyChart, go to [email or web address].    Your next appointment:   1 year(s)  Provider:   Parke Poisson, MD     Other Instructions none

## 2023-05-17 NOTE — Progress Notes (Signed)
Cardiology Office Note:  .   Date:  05/17/2023  ID:  [name], DOB 1951-03-11, MRN [id number] PCP: Shirline Frees, NP  [facility] Providers Cardiologist:  Parke Poisson, MD    History of Present Illness: .   [name] is a 73 y.o. female.  Discussed the use of AI scribe software for clinical note transcription with the patient, who gave verbal consent to proceed.  History of Present Illness   The patient presents with balance issues and a history of falls.  She has experienced several falls over the past year, with the most recent fall resulting in a concussion and a persistent dent in her forehead. Ongoing balance issues have been noted since the concussion. No chest pain, but she reports occasional ankle swelling and uses furosemide as needed. She does not believe her breathing is affected by not taking furosemide regularly.  She is currently taking amlodipine 5 mg daily, atorvastatin 10 mg, a baby aspirin, and furosemide as needed. She reports not using nitroglycerin or Xanax in the past year. She also uses an inhaler when experiencing breathing difficulties.  She mentions a difficult year emotionally, with the passing of her husband just before Christmas last year and the recent death of her nephew in a car accident. Her daughter and grandson also experienced pneumonia during this period.  She experiences pain in the muscle around her knee, which has not been relieved by cortisone injections. She sometimes uses a cane for support.  She has a history of mild sleep apnea, with one episode of stopped breathing over three nights.        ROS: negative except per HPI above.  Studies Reviewed: Marland Kitchen   EKG Interpretation Date/Time:  Friday May 17 2023 08:50:27 EST Ventricular Rate:  68 PR Interval:  176 QRS Duration:  86 QT Interval:  406 QTC Calculation: 431 R Axis:   44  Text Interpretation: Normal sinus rhythm Normal ECG Confirmed  by Weston Brass ([id number]) on 05/17/2023 9:35:26 AM    Results   DIAGNOSTIC EKG: Normal (05/17/2023) Echocardiogram: Grossly normal, normal ejection fraction, normal myocardial strength, mild degenerative changes on the aortic valve (End of 2023) Sleep study: Mild obstructive sleep apnea, one episode of apnea in three nights     Risk Assessment/Calculations:        Physical Exam:   VS:  BP 122/62 (BP Location: Left Arm, Patient Position: Sitting, Cuff Size: Normal)   Pulse 68   Ht 5\' 3"  (1.6 m)   Wt 179 lb (81.2 kg)   BMI 31.71 kg/m    Wt Readings from Last 3 Encounters:  05/24/23 177 lb (80.3 kg)  05/17/23 179 lb (81.2 kg)  05/03/23 179 lb (81.2 kg)     Physical Exam   VITALS: Blood pressure and heart rate within normal limits. EKG normal. CHEST: Lungs clear to auscultation. CARDIOVASCULAR: Presence of a faint systolic murmur     GEN: Well nourished, well developed in no acute distress NECK: No JVD; No carotid bruits CARDIAC: RRR RESPIRATORY:  Clear to auscultation without rales, wheezing or rhonchi  ABDOMEN: Soft, non-tender, non-distended EXTREMITIES:  No edema; No deformity   ASSESSMENT AND PLAN: .    Assessment & Plan Essential hypertension  Murmur  Pure hypertriglyceridemia  Excessive daytime sleepiness  OSA (obstructive sleep apnea)  Falls  Primary hypertension   Assessment and Plan    Falls and Balance Issues Recent falls with one resulting in a concussion. Ongoing balance issues reported. -Continue  working on balance improvement plan with primary care provider.  Mild Sleep Apnea Recent sleep study showed mild sleep apnea with one episode of stopped breathing over three nights. No current significant symptoms reported. -Monitor for worsening symptoms such as increased shortness of breath, waking up due to stopped breathing, or increased snoring. Report any changes to primary care provider.  Cardiovascular Health HTN HLD No chest pain  reported. Current medications include Amlodipine 5mg  daily, Atorvastatin 10mg  daily, and baby Aspirin. Furosemide taken as needed for ankle swelling. No significant changes in heart murmur detected on examination. -Continue current medications. Monitor for any changes in symptoms or heart murmur.  General Health Maintenance  -Continue with planned health maintenance appointments.  Follow-up in 1 year.

## 2023-05-21 ENCOUNTER — Ambulatory Visit: Payer: Medicare Other

## 2023-05-23 ENCOUNTER — Encounter: Payer: Self-pay | Admitting: Podiatry

## 2023-05-23 ENCOUNTER — Ambulatory Visit: Payer: Medicare Other | Admitting: Podiatry

## 2023-05-23 DIAGNOSIS — B351 Tinea unguium: Secondary | ICD-10-CM

## 2023-05-23 DIAGNOSIS — E1142 Type 2 diabetes mellitus with diabetic polyneuropathy: Secondary | ICD-10-CM | POA: Diagnosis not present

## 2023-05-23 DIAGNOSIS — M79674 Pain in right toe(s): Secondary | ICD-10-CM

## 2023-05-23 DIAGNOSIS — M2042 Other hammer toe(s) (acquired), left foot: Secondary | ICD-10-CM

## 2023-05-23 DIAGNOSIS — M79675 Pain in left toe(s): Secondary | ICD-10-CM

## 2023-05-23 DIAGNOSIS — M2041 Other hammer toe(s) (acquired), right foot: Secondary | ICD-10-CM | POA: Diagnosis not present

## 2023-05-23 NOTE — Progress Notes (Signed)
  Subjective:  Patient ID: [name], female    DOB: 10-07-1950,   MRN: [id number]  No chief complaint on file.   73 y.o. female presents for concern of thickened elongated and painful nails that are difficult to trim. Requesting to have them trimmed today. Relates burning and tingling in their feet. Patient is diabetic and last A1c was  Lab Results  Component Value Date   HGBA1C 6.9 (A) 02/22/2023   .   PCP:  Merna Huxley, NP    . Denies any other pedal complaints. Denies n/v/f/c.   Past Medical History:  Diagnosis Date   Allergy    Anemia    Anxiety    ANXIETY DEPRESSION 09/15/2007   Arthritis    ASTHMA 05/15/2009   Asthma    DIABETES MELLITUS, TYPE II 12/11/2006   DIVERTICULOSIS, COLON 12/11/2006   Edema 01/15/2009   Gout    pt denies; toe was broken   Headache(784.0) 12/11/2006   Hyperlipidemia    Hypertension    HYPERTENSION NEC 08/14/2007   HYPOTHYROIDISM 12/11/2006   PELVIC PAIN, CHRONIC 08/14/2007   Restless leg syndrome     Objective:  Physical Exam: Vascular: DP/PT pulses 2/4 bilateral. CFT <3 seconds. Absent hair growth on digits. Edema noted to bilateral lower extremities. Xerosis noted bilaterally.  Skin. No lacerations or abrasions bilateral feet. Nails 1-5 bilateral  are thickened discolored and elongated with subungual debris. Eccymosis underlying right great toe.  Musculoskeletal: MMT 5/5 bilateral lower extremities in DF, PF, Inversion and Eversion. Deceased ROM in DF of ankle joint.  Neurological: Sensation intact to light touch. Protective sensation diminished bilateral.    Assessment:   1. Pain due to onychomycosis of toenails of both feet   2. Type 2 diabetes mellitus with peripheral neuropathy (HCC)   3. Hammertoe, bilateral      Plan:  Patient was evaluated and treated and all questions answered. -Discussed and educated patient on diabetic foot care, especially with  regards to the vascular, neurological and  musculoskeletal systems.  -Stressed the importance of good glycemic control and the detriment of not  controlling glucose levels in relation to the foot. -Discussed supportive shoes at all times and checking feet regularly.  -Mechanically debrided all nails 1-5 bilateral using sterile nail nipper and filed with dremel without incident  -Answered all patient questions -DM shoes ordered.  -Patient to return  in 3 months for at risk foot care -Patient advised to call the office if any problems or questions arise in the meantime.   Asberry Failing, DPM

## 2023-05-24 ENCOUNTER — Ambulatory Visit (INDEPENDENT_AMBULATORY_CARE_PROVIDER_SITE_OTHER): Payer: Medicare Other | Admitting: Adult Health

## 2023-05-24 ENCOUNTER — Ambulatory Visit: Payer: Medicare Other

## 2023-05-24 VITALS — BP 120/60 | HR 69 | Temp 98.0°F | Ht 63.5 in | Wt 177.0 lb

## 2023-05-24 DIAGNOSIS — E038 Other specified hypothyroidism: Secondary | ICD-10-CM

## 2023-05-24 DIAGNOSIS — E559 Vitamin D deficiency, unspecified: Secondary | ICD-10-CM

## 2023-05-24 DIAGNOSIS — E782 Mixed hyperlipidemia: Secondary | ICD-10-CM

## 2023-05-24 DIAGNOSIS — I10 Essential (primary) hypertension: Secondary | ICD-10-CM

## 2023-05-24 DIAGNOSIS — Z0000 Encounter for general adult medical examination without abnormal findings: Secondary | ICD-10-CM | POA: Diagnosis not present

## 2023-05-24 DIAGNOSIS — F419 Anxiety disorder, unspecified: Secondary | ICD-10-CM

## 2023-05-24 DIAGNOSIS — E611 Iron deficiency: Secondary | ICD-10-CM

## 2023-05-24 DIAGNOSIS — E119 Type 2 diabetes mellitus without complications: Secondary | ICD-10-CM

## 2023-05-24 DIAGNOSIS — Z7984 Long term (current) use of oral hypoglycemic drugs: Secondary | ICD-10-CM

## 2023-05-24 DIAGNOSIS — F32A Depression, unspecified: Secondary | ICD-10-CM

## 2023-05-24 DIAGNOSIS — G2581 Restless legs syndrome: Secondary | ICD-10-CM

## 2023-05-24 NOTE — Patient Instructions (Signed)
 It was great seeing you today   We will follow up with you regarding your lab work   Please let me know if you need anything

## 2023-05-24 NOTE — Progress Notes (Signed)
 Subjective:    Patient ID: [name], female    DOB: 1950-09-20, 73 y.o.   MRN: [id number]  HPI Patient presents for yearly preventative medicine examination. She is a pleasant 73 year old female who  has a past medical history of Allergy, Anemia, Anxiety, ANXIETY DEPRESSION (09/15/2007), Arthritis, ASTHMA (05/15/2009), Asthma, DIABETES MELLITUS, TYPE II (12/11/2006), DIVERTICULOSIS, COLON (12/11/2006), Edema (01/15/2009), Gout, Headache(784.0) (12/11/2006), Hyperlipidemia, Hypertension, HYPERTENSION NEC (08/14/2007), HYPOTHYROIDISM (12/11/2006), PELVIC PAIN, CHRONIC (08/14/2007), and Restless leg syndrome.  DM -currently prescribed metformin  500 mg in the morning and 1000 mg in the evening  She does not check blood sugar routinely.  Lab Results  Component Value Date   HGBA1C 6.9 (A) 02/22/2023   HGBA1C 7.0 (A) 11/22/2022   HGBA1C 6.8 (A) 08/21/2022   Esssential hypertension-takes Hyzaar 100-25 mg daily.  She denies chest pain, shortness of breath, dizziness, lightheadedness, or headaches.  Blood pressures at home have been in the 120s to high 140s over 60s to 80s.  BP Readings from Last 3 Encounters:  05/24/23 120/60  05/17/23 122/62  05/03/23 120/60   Hypothyroidism-controlled with Synthroid  88 mcg daily Lab Results  Component Value Date   TSH 1.27 05/11/2022   Restless leg syndrome-takes 1.5 mg of Mirapex  nightly.  She does feel well controlled on this medication  Anxiety and depression-she has been taking 20 mg of Celexa . Anxiety and depression have increased over the last few months.   Hyperlipidemia-prescribed lipitor 10 mg daily.  She denies myalgia or fatigue Lab Results  Component Value Date   CHOL 109 05/11/2022   HDL 50.00 05/11/2022   LDLCALC 28 05/11/2022   LDLDIRECT 68.0 10/29/2018   TRIG 154.0 (H) 05/11/2022   CHOLHDL 2 05/11/2022   Vitamin D  Deficiency - takes 1000 units daily   Iron Deficiency - she has been taking iron supplements OTC.     All immunizations and health maintenance protocols were reviewed with the patient and needed orders were placed.  Appropriate screening laboratory values were ordered for the patient including screening of hyperlipidemia, renal function and hepatic function.  Medication reconciliation,  past medical history, social history, problem list and allergies were reviewed in detail with the patient  Goals were established with regard to weight loss, exercise, and  diet in compliance with medications Wt Readings from Last 3 Encounters:  05/24/23 177 lb (80.3 kg)  05/17/23 179 lb (81.2 kg)  05/03/23 179 lb (81.2 kg)    Review of Systems  Constitutional: Negative.   HENT: Negative.    Eyes: Negative.   Respiratory: Negative.    Cardiovascular: Negative.   Gastrointestinal: Negative.   Endocrine: Negative.   Genitourinary: Negative.   Musculoskeletal:  Positive for arthralgias.  Skin: Negative.   Allergic/Immunologic: Negative.   Neurological: Negative.   Hematological: Negative.   Psychiatric/Behavioral: Negative.     Past Medical History:  Diagnosis Date   Allergy    Anemia    Anxiety    ANXIETY DEPRESSION 09/15/2007   Arthritis    ASTHMA 05/15/2009   Asthma    DIABETES MELLITUS, TYPE II 12/11/2006   DIVERTICULOSIS, COLON 12/11/2006   Edema 01/15/2009   Gout    pt denies; toe was broken   Headache(784.0) 12/11/2006   Hyperlipidemia    Hypertension    HYPERTENSION NEC 08/14/2007   HYPOTHYROIDISM 12/11/2006   PELVIC PAIN, CHRONIC 08/14/2007   Restless leg syndrome     Social History   Socioeconomic History   Marital status: Widowed    Spouse  name: Not on file   Number of children: Not on file   Years of education: Not on file   Highest education level: Never attended school  Occupational History   Not on file  Tobacco Use   Smoking status: Never   Smokeless tobacco: Never  Vaping Use   Vaping status: Never Used  Substance and Sexual Activity   Alcohol  use: Yes    Comment: wine occ   Drug use: No   Sexual activity: Not Currently    Comment: hysterectomy, <5 sexual partners, <16 y/o, no STD  Other Topics Concern   Not on file  Social History Narrative   Not on file   Social Drivers of Health   Financial Resource Strain: Low Risk  (05/01/2023)   Overall Financial Resource Strain (CARDIA)    Difficulty of Paying Living Expenses: Not very hard  Food Insecurity: Food Insecurity Present (05/01/2023)   Hunger Vital Sign    Worried About Running Out of Food in the Last Year: Sometimes true    Ran Out of Food in the Last Year: Never true  Transportation Needs: No Transportation Needs (05/01/2023)   PRAPARE - Administrator, Civil Service (Medical): No    Lack of Transportation (Non-Medical): No  Physical Activity: Insufficiently Active (05/01/2023)   Exercise Vital Sign    Days of Exercise per Week: 2 days    Minutes of Exercise per Session: 10 min  Stress: No Stress Concern Present (05/01/2023)   [employer] of Occupational Health - Occupational Stress Questionnaire    Feeling of Stress : Only a little  Social Connections: Moderately Integrated (05/01/2023)   Social Connection and Isolation Panel [NHANES]    Frequency of Communication with Friends and Family: More than three times a week    Frequency of Social Gatherings with Friends and Family: More than three times a week    Attends Religious Services: More than 4 times per year    Active Member of [employer] or Organizations: Yes    Attends Banker Meetings: More than 4 times per year    Marital Status: Widowed  Intimate Partner Violence: Not At Risk (03/27/2023)   Received from [facility]   HITS    Over the last 12 months how often did your partner physically hurt you?: Never    Over the last 12 months how often did your partner insult you or talk down to you?: Never    Over the last 12 months how often did your partner threaten you with physical harm?:  Never    Over the last 12 months how often did your partner scream or curse at you?: Never    Past Surgical History:  Procedure Laterality Date   ABDOMINAL HYSTERECTOMY     APPENDECTOMY     Bladder Suspension     COLONOSCOPY  10/08/2017   Dr. Abran   LEFT HEART CATH AND CORONARY ANGIOGRAPHY N/A 09/05/2021   Procedure: LEFT HEART CATH AND CORONARY ANGIOGRAPHY;  Surgeon: Burnard Debby LABOR, MD;  Location: [facility] INVASIVE CV LAB;  Service: Cardiovascular;  Laterality: N/A;    Family History  Problem Relation Age of Onset   COPD Mother        smoker   Diabetes Mother    COPD Father        smoker   Diabetes Father    Diabetes Sister    Diabetes Mellitus II Sister    Diabetes Sister    Diabetes Sister  Diabetes Sister    Lung cancer Nephew 60   Breast cancer Other 28   Colon cancer Neg Hx    Esophageal cancer Neg Hx    Pancreatic cancer Neg Hx    Rectal cancer Neg Hx    Stomach cancer Neg Hx     Allergies  Allergen Reactions   Benadryl [Diphenhydramine Hcl] Anaphylaxis   Ace Inhibitors Cough   Amoxicillin     Unknown reaction   Aspirin  Nausea And Vomiting    Tolerates low dose aspirin     Codeine Nausea And Vomiting   Fish Allergy Nausea And Vomiting   Hydrocodone  Nausea And Vomiting   Indomethacin     Unknown reaction    Pentazocine Lactate     passing out   Promethazine Hcl     Unknown reaction    Quinine     itching redface   Chlorphen-Phenyleph-Methscop Rash    Current Outpatient Medications on File Prior to Visit  Medication Sig Dispense Refill   acetaminophen  (TYLENOL ) 500 MG tablet Take 1,000 mg by mouth every 8 (eight) hours as needed for moderate pain (pain score 4-6).     albuterol  (VENTOLIN  HFA) 108 (90 Base) MCG/ACT inhaler Inhale 2 puffs into the lungs every 6 (six) hours as needed for wheezing or shortness of breath. 8 g 0   ALPRAZolam  (XANAX ) 0.25 MG tablet Take 1 tablet (0.25 mg total) by mouth 2 (two) times daily as needed for anxiety. 60 tablet 0    amLODipine  (NORVASC ) 5 MG tablet TAKE 1 TABLET(5 MG) BY MOUTH DAILY 90 tablet 3   aspirin  81 MG tablet Take 81 mg by mouth at bedtime.     atorvastatin  (LIPITOR) 10 MG tablet TAKE 1 TABLET(10 MG) BY MOUTH DAILY 90 tablet 3   betamethasone  valerate ointment (VALISONE ) 0.1 % Apply 1 Application topically 2 (two) times daily. Place in the affected area twice a bid for 2 weeks and then place on the area at night twice weekly. 45 g 0   blood glucose meter kit and supplies KIT Dispense based on patient and insurance preference. Use up to four times daily as directed. 1 each 0   Carboxymethylcellul-Glycerin (LUBRICATING EYE DROPS OP) Place 1 drop into both eyes daily as needed (dry eyes).     cholecalciferol (VITAMIN D3) 25 MCG (1000 UT) tablet Take 1,000 Units by mouth daily.     citalopram  (CELEXA ) 20 MG tablet TAKE 1 TABLET(20 MG) BY MOUTH DAILY 90 tablet 1   diclofenac  Sodium (VOLTAREN ) 1 % GEL Apply 2 g topically 4 (four) times daily. 150 g 0   Emollient (GOLD BOND DIABETICS DRY SKIN) CREA Apply 1 application. topically at bedtime.     furosemide  (LASIX ) 20 MG tablet Take one tab daily x 3 days as needed for swelling in legs 90 tablet 0   glucose blood test strip Use to check blood glucose TID 200 each 0   HYDROcodone  bit-homatropine (HYCODAN) 5-1.5 MG/5ML syrup Take 5 mLs by mouth every 8 (eight) hours as needed for cough. 120 mL 0   Lancets 28G MISC Use to check blood glucose 4 times daily 100 each 0   levothyroxine  (SYNTHROID ) 88 MCG tablet TAKE 1 TABLET(88 MCG) BY MOUTH DAILY 90 tablet 3   Lidocaine  4 % AERO Apply 1 spray topically at bedtime as needed (foot pain).     losartan -hydrochlorothiazide  (HYZAAR) 100-25 MG tablet TAKE 1 TABLET BY MOUTH DAILY 90 tablet 3   meclizine  (ANTIVERT ) 25 MG tablet Take 1 tablet (  25 mg total) by mouth 3 (three) times daily as needed for dizziness. 30 tablet 0   metFORMIN  (GLUCOPHAGE ) 500 MG tablet TAKE 1 TABLET BY MOUTH IN THE MORNING AND 2 TABLETS IN THE  EVENING 270 tablet 0   pramipexole  (MIRAPEX ) 1.5 MG tablet TAKE 1 TABLET(1.5 MG) BY MOUTH AT BEDTIME 90 tablet 1   Prenatal Vit-Fe Fumarate-FA (PRENATAL VITAMIN PO) Take 1 tablet by mouth daily.     traMADol (ULTRAM) 50 MG tablet Take by mouth.     vitamin B-12 (CYANOCOBALAMIN ) 1000 MCG tablet Take 1,000 mcg by mouth daily.     nitroGLYCERIN  (NITROSTAT ) 0.4 MG SL tablet Place 1 tablet (0.4 mg total) under the tongue every 5 (five) minutes as needed for chest pain. (Patient not taking: Reported on 04/29/2023) 25 tablet 3   No current facility-administered medications on file prior to visit.    BP 120/60   Pulse 69   Temp 98 F (36.7 C) (Oral)   Ht 5' 3.5 (1.613 m)   Wt 177 lb (80.3 kg)   SpO2 96%   BMI 30.86 kg/m       Objective:   Physical Exam Vitals and nursing note reviewed.  Constitutional:      General: She is not in acute distress.    Appearance: Normal appearance. She is not ill-appearing.  HENT:     Head: Normocephalic and atraumatic.     Right Ear: Tympanic membrane, ear canal and external ear normal. There is no impacted cerumen.     Left Ear: Tympanic membrane, ear canal and external ear normal. There is no impacted cerumen.     Nose: Nose normal. No congestion or rhinorrhea.     Mouth/Throat:     Mouth: Mucous membranes are moist.     Pharynx: Oropharynx is clear.  Eyes:     Extraocular Movements: Extraocular movements intact.     Conjunctiva/sclera: Conjunctivae normal.     Pupils: Pupils are equal, round, and reactive to light.  Neck:     Vascular: No carotid bruit.  Cardiovascular:     Rate and Rhythm: Normal rate and regular rhythm.     Pulses: Normal pulses.     Heart sounds:     No friction rub. No gallop.  Pulmonary:     Effort: Pulmonary effort is normal.     Breath sounds: Normal breath sounds.  Abdominal:     General: Abdomen is flat. Bowel sounds are normal. There is no distension.     Palpations: Abdomen is soft. There is no mass.      Tenderness: There is no abdominal tenderness. There is no guarding or rebound.     Hernia: No hernia is present.  Musculoskeletal:        General: Normal range of motion.     Cervical back: Normal range of motion and neck supple.  Lymphadenopathy:     Cervical: No cervical adenopathy.  Skin:    General: Skin is warm and dry.     Capillary Refill: Capillary refill takes less than 2 seconds.  Neurological:     General: No focal deficit present.     Mental Status: She is alert and oriented to person, place, and time.  Psychiatric:        Mood and Affect: Mood normal.        Behavior: Behavior normal.        Thought Content: Thought content normal.        Judgment: Judgment normal.  Assessment & Plan:  1. Routine general medical examination at a health care facility (Primary) Today patient counseled on age appropriate routine health concerns for screening and prevention, each reviewed and up to date or declined. Immunizations reviewed and up to date or declined. Labs ordered and reviewed. Risk factors for depression reviewed and negative. Hearing function and visual acuity are intact. ADLs screened and addressed as needed. Functional ability and level of safety reviewed and appropriate. Education, counseling and referrals performed based on assessed risks today. Patient provided with a copy of personalized plan for preventive services.   2. Diabetes mellitus treated with oral medication (HCC) - Consider increase in metformin   - Follow up in three months - Work on weight loss measures - CBC with Differential/Platelet; Future - Comprehensive metabolic panel; Future - Lipid panel; Future - TSH; Future - Hemoglobin A1c; Future - Microalbumin/Creatinine Ratio, Urine; Future  3. Essential hypertension - Well controlled. No change in medication  - CBC with Differential/Platelet; Future - Comprehensive metabolic panel; Future - Lipid panel; Future - TSH; Future  4. Other  specified hypothyroidism - Consider dose change of synthroid   - CBC with Differential/Platelet; Future - Comprehensive metabolic panel; Future - Lipid panel; Future - TSH; Future  5. Restless leg syndrome - Continue with Mirapex   - CBC with Differential/Platelet; Future - Comprehensive metabolic panel; Future - Lipid panel; Future - TSH; Future  6. Anxiety and depression - Continue with SSRI  - CBC with Differential/Platelet; Future - Comprehensive metabolic panel; Future - Lipid panel; Future - TSH; Future  7. Mixed hyperlipidemia - Consider increase in statin  - CBC with Differential/Platelet; Future - Comprehensive metabolic panel; Future - Lipid panel; Future - TSH; Future  8. Vitamin D  deficiency  - VITAMIN D  25 Hydroxy (Vit-D Deficiency, Fractures); Future  9. Iron deficiency  - CBC with Differential/Platelet; Future - Comprehensive metabolic panel; Future - Lipid panel; Future - TSH; Future - IBC + Ferritin; Future  Darleene Shape, NP

## 2023-06-09 ENCOUNTER — Other Ambulatory Visit: Payer: Self-pay | Admitting: Adult Health

## 2023-06-09 DIAGNOSIS — F32A Depression, unspecified: Secondary | ICD-10-CM

## 2023-06-13 ENCOUNTER — Ambulatory Visit
Admission: RE | Admit: 2023-06-13 | Discharge: 2023-06-13 | Disposition: A | Discharge status: Home / Self Care | Payer: Medicare Other | Source: Ambulatory Visit | Attending: Adult Health | Admitting: Adult Health

## 2023-06-13 ENCOUNTER — Other Ambulatory Visit (INDEPENDENT_AMBULATORY_CARE_PROVIDER_SITE_OTHER): Payer: Medicare Other

## 2023-06-13 DIAGNOSIS — E611 Iron deficiency: Secondary | ICD-10-CM

## 2023-06-13 DIAGNOSIS — E119 Type 2 diabetes mellitus without complications: Secondary | ICD-10-CM | POA: Diagnosis not present

## 2023-06-13 DIAGNOSIS — E782 Mixed hyperlipidemia: Secondary | ICD-10-CM | POA: Diagnosis not present

## 2023-06-13 DIAGNOSIS — E038 Other specified hypothyroidism: Secondary | ICD-10-CM | POA: Diagnosis not present

## 2023-06-13 DIAGNOSIS — Z7984 Long term (current) use of oral hypoglycemic drugs: Secondary | ICD-10-CM | POA: Diagnosis not present

## 2023-06-13 DIAGNOSIS — E559 Vitamin D deficiency, unspecified: Secondary | ICD-10-CM

## 2023-06-13 DIAGNOSIS — Z1231 Encounter for screening mammogram for malignant neoplasm of breast: Secondary | ICD-10-CM

## 2023-06-13 DIAGNOSIS — I10 Essential (primary) hypertension: Secondary | ICD-10-CM

## 2023-06-13 DIAGNOSIS — F32A Depression, unspecified: Secondary | ICD-10-CM

## 2023-06-13 DIAGNOSIS — G2581 Restless legs syndrome: Secondary | ICD-10-CM

## 2023-06-13 LAB — CBC WITH DIFFERENTIAL/PLATELET
Basophils Absolute: 0.1 10*3/uL (ref 0.0–0.1)
Basophils Relative: 0.8 % (ref 0.0–3.0)
Eosinophils Absolute: 0.2 10*3/uL (ref 0.0–0.7)
Eosinophils Relative: 3.8 % (ref 0.0–5.0)
HCT: 36.7 % (ref 36.0–46.0)
Hemoglobin: 11.9 g/dL — ABNORMAL LOW (ref 12.0–15.0)
Lymphocytes Relative: 24.9 % (ref 12.0–46.0)
Lymphs Abs: 1.6 10*3/uL (ref 0.7–4.0)
MCHC: 32.4 g/dL (ref 30.0–36.0)
MCV: 88.7 fL (ref 78.0–100.0)
Monocytes Absolute: 0.4 10*3/uL (ref 0.1–1.0)
Monocytes Relative: 5.7 % (ref 3.0–12.0)
Neutro Abs: 4.3 10*3/uL (ref 1.4–7.7)
Neutrophils Relative %: 64.8 % (ref 43.0–77.0)
Platelets: 268 10*3/uL (ref 150.0–400.0)
RBC: 4.14 Mil/uL (ref 3.87–5.11)
RDW: 15.5 % (ref 11.5–15.5)
WBC: 6.6 10*3/uL (ref 4.0–10.5)

## 2023-06-13 LAB — VITAMIN D 25 HYDROXY (VIT D DEFICIENCY, FRACTURES): VITD: 41.7 ng/mL (ref 30.00–100.00)

## 2023-06-13 LAB — IBC + FERRITIN
Ferritin: 18.2 ng/mL (ref 10.0–291.0)
Iron: 56 ug/dL (ref 42–145)
Saturation Ratios: 16.3 % — ABNORMAL LOW (ref 20.0–50.0)
TIBC: 344.4 ug/dL (ref 250.0–450.0)
Transferrin: 246 mg/dL (ref 212.0–360.0)

## 2023-06-13 LAB — MICROALBUMIN / CREATININE URINE RATIO
Creatinine,U: 84 mg/dL
Microalb Creat Ratio: 8.3 mg/g (ref 0.0–30.0)
Microalb, Ur: <0.7 mg/dL (ref 0.0–1.9)

## 2023-06-13 LAB — LIPID PANEL
Cholesterol: 102 mg/dL (ref 0–200)
HDL: 46.1 mg/dL (ref >39.00)
LDL Cholesterol: 22 mg/dL (ref 0–99)
NonHDL: 56.09
Total CHOL/HDL Ratio: 2
Triglycerides: 170 mg/dL — ABNORMAL HIGH (ref 0.0–149.0)
VLDL: 34 mg/dL (ref 0.0–40.0)

## 2023-06-13 LAB — COMPREHENSIVE METABOLIC PANEL
ALT: 12 U/L (ref 0–35)
AST: 12 U/L (ref 0–37)
Albumin: 3.9 g/dL (ref 3.5–5.2)
Alkaline Phosphatase: 69 U/L (ref 39–117)
BUN: 16 mg/dL (ref 6–23)
CO2: 30 meq/L (ref 19–32)
Calcium: 9.2 mg/dL (ref 8.4–10.5)
Chloride: 103 meq/L (ref 96–112)
Creatinine, Ser: 0.81 mg/dL (ref 0.40–1.20)
GFR: 73.26 mL/min (ref >60.00)
Glucose, Bld: 119 mg/dL — ABNORMAL HIGH (ref 70–99)
Potassium: 3.9 meq/L (ref 3.5–5.1)
Sodium: 141 meq/L (ref 135–145)
Total Bilirubin: 0.6 mg/dL (ref 0.2–1.2)
Total Protein: 6.6 g/dL (ref 6.0–8.3)

## 2023-06-13 LAB — HEMOGLOBIN A1C: Hgb A1c MFr Bld: 7.4 % — ABNORMAL HIGH (ref 4.6–6.5)

## 2023-06-13 LAB — TSH: TSH: 1.4 u[IU]/mL (ref 0.35–5.50)

## 2023-06-16 ENCOUNTER — Other Ambulatory Visit: Payer: Self-pay | Admitting: Adult Health

## 2023-06-16 DIAGNOSIS — E118 Type 2 diabetes mellitus with unspecified complications: Secondary | ICD-10-CM

## 2023-06-18 MED ORDER — METFORMIN HCL 1000 MG PO TABS: 1000.0000 mg | ORAL_TABLET | Freq: Two times a day (BID) | ORAL | 3 refills | Status: AC

## 2023-06-18 NOTE — Addendum Note (Signed)
 Addended by: Waymon Amato R on: 06/18/2023 10:31 AM   Modules accepted: Orders

## 2023-07-02 ENCOUNTER — Ambulatory Visit

## 2023-07-02 ENCOUNTER — Ambulatory Visit (INDEPENDENT_AMBULATORY_CARE_PROVIDER_SITE_OTHER): Admitting: Adult Health

## 2023-07-02 ENCOUNTER — Encounter: Payer: Self-pay | Admitting: Adult Health

## 2023-07-02 VITALS — BP 128/60 | HR 70 | Temp 98.2°F | Ht 63.5 in | Wt 175.0 lb

## 2023-07-02 DIAGNOSIS — G8929 Other chronic pain: Secondary | ICD-10-CM

## 2023-07-02 DIAGNOSIS — M25561 Pain in right knee: Secondary | ICD-10-CM | POA: Diagnosis not present

## 2023-07-02 MED ORDER — MELOXICAM 15 MG PO TABS: 15.0000 mg | ORAL_TABLET | Freq: Every day | ORAL | 0 refills | Status: DC

## 2023-07-02 NOTE — Progress Notes (Signed)
 Subjective:    Patient ID: [name], female    DOB: [birth year], 73 y.o.   MRN: [id number]  HPI 73 year old female who  has a past medical history of Allergy, Anemia, Anxiety, ANXIETY DEPRESSION (09/15/2007), Arthritis, ASTHMA (05/15/2009), Asthma, DIABETES MELLITUS, TYPE II (12/11/2006), DIVERTICULOSIS, COLON (12/11/2006), Edema (01/15/2009), Gout, Headache(784.0) (12/11/2006), Hyperlipidemia, Hypertension, HYPERTENSION NEC (08/14/2007), HYPOTHYROIDISM (12/11/2006), PELVIC PAIN, CHRONIC (08/14/2007), and Restless leg syndrome.  She presents to the office today for ongoing right knee pain. She was last seen for this issues on 05/03/2023.  At this time her pain has been present for roughly 2 weeks after sustaining a fall.  At this time I advised her to use Voltaren gel and ice.  She has been using Voltaren gel at nighttime and feels as though this is helpful but only for short period of time.  Pain seems to be getting worse in the right knee.  She feels unsteady had to near falls over the weekend.  He reports that he just fell forward because she was off balance.  Some was able to catch her on the first fall and on the second fall she was in her bathroom and fell into a cabinet.  No injuries noted.   Review of Systems See HPI   Past Medical History:  Diagnosis Date   Allergy    Anemia    Anxiety    ANXIETY DEPRESSION 09/15/2007   Arthritis    ASTHMA 05/15/2009   Asthma    DIABETES MELLITUS, TYPE II 12/11/2006   DIVERTICULOSIS, COLON 12/11/2006   Edema 01/15/2009   Gout    pt denies; toe was broken   Headache(784.0) 12/11/2006   Hyperlipidemia    Hypertension    HYPERTENSION NEC 08/14/2007   HYPOTHYROIDISM 12/11/2006   PELVIC PAIN, CHRONIC 08/14/2007   Restless leg syndrome     Social History   Socioeconomic History   Marital status: Widowed    Spouse name: Not on file   Number of children: Not on file   Years of education: Not on file   Highest education  level: Never attended school  Occupational History   Not on file  Tobacco Use   Smoking status: Never   Smokeless tobacco: Never  Vaping Use   Vaping status: Never Used  Substance and Sexual Activity   Alcohol use: Yes    Comment: wine occ   Drug use: No   Sexual activity: Not Currently    Comment: hysterectomy, <5 sexual partners, <18 y/o, no STD  Other Topics Concern   Not on file  Social History Narrative   Not on file   Social Drivers of Health   Financial Resource Strain: Low Risk  (05/01/2023)   Overall Financial Resource Strain (CARDIA)    Difficulty of Paying Living Expenses: Not very hard  Food Insecurity: Food Insecurity Present (05/01/2023)   Hunger Vital Sign    Worried About Running Out of Food in the Last Year: Sometimes true    Ran Out of Food in the Last Year: Never true  Transportation Needs: No Transportation Needs (05/01/2023)   PRAPARE - Administrator, Civil Service (Medical): No    Lack of Transportation (Non-Medical): No  Physical Activity: Insufficiently Active (05/01/2023)   Exercise Vital Sign    Days of Exercise per Week: 2 days    Minutes of Exercise per Session: 10 min  Stress: No Stress Concern Present (05/01/2023)   [employer] of Occupational Health - Occupational Stress  Questionnaire    Feeling of Stress : Only a little  Social Connections: Moderately Integrated (05/01/2023)   Social Connection and Isolation Panel [NHANES]    Frequency of Communication with Friends and Family: More than three times a week    Frequency of Social Gatherings with Friends and Family: More than three times a week    Attends Religious Services: More than 4 times per year    Active Member of [employer] or Organizations: Yes    Attends Banker Meetings: More than 4 times per year    Marital Status: Widowed  Intimate Partner Violence: Not At Risk (03/27/2023)   Received from [facility]   HITS    Over the last 12 months how often did your  partner physically hurt you?: Never    Over the last 12 months how often did your partner insult you or talk down to you?: Never    Over the last 12 months how often did your partner threaten you with physical harm?: Never    Over the last 12 months how often did your partner scream or curse at you?: Never    Past Surgical History:  Procedure Laterality Date   ABDOMINAL HYSTERECTOMY     APPENDECTOMY     Bladder Suspension     COLONOSCOPY  10/08/2017   Dr. Marina Goodell   LEFT HEART CATH AND CORONARY ANGIOGRAPHY N/A 09/05/2021   Procedure: LEFT HEART CATH AND CORONARY ANGIOGRAPHY;  Surgeon: Lennette Bihari, MD;  Location: [facility] INVASIVE CV LAB;  Service: Cardiovascular;  Laterality: N/A;    Family History  Problem Relation Age of Onset   COPD Mother        smoker   Diabetes Mother    COPD Father        smoker   Diabetes Father    Diabetes Sister    Diabetes Mellitus II Sister    Diabetes Sister    Diabetes Sister    Diabetes Sister    Lung cancer Nephew 60   Breast cancer Other 59   Colon cancer Neg Hx    Esophageal cancer Neg Hx    Pancreatic cancer Neg Hx    Rectal cancer Neg Hx    Stomach cancer Neg Hx     Allergies  Allergen Reactions   Benadryl [Diphenhydramine Hcl] Anaphylaxis   Ace Inhibitors Cough   Amoxicillin     Unknown reaction   Aspirin Nausea And Vomiting    Tolerates low dose aspirin    Codeine Nausea And Vomiting   Fish Allergy Nausea And Vomiting   Hydrocodone Nausea And Vomiting   Indomethacin     Unknown reaction    Pentazocine Lactate     passing out   Promethazine Hcl     Unknown reaction    Quinine     itching redface   Chlorphen-Phenyleph-Methscop Rash    Current Outpatient Medications on File Prior to Visit  Medication Sig Dispense Refill   acetaminophen (TYLENOL) 500 MG tablet Take 1,000 mg by mouth every 8 (eight) hours as needed for moderate pain (pain score 4-6).     albuterol (VENTOLIN HFA) 108 (90 Base) MCG/ACT inhaler Inhale 2 puffs  into the lungs every 6 (six) hours as needed for wheezing or shortness of breath. 8 g 0   ALPRAZolam (XANAX) 0.25 MG tablet Take 1 tablet (0.25 mg total) by mouth 2 (two) times daily as needed for anxiety. 60 tablet 0   amLODipine (NORVASC) 5 MG tablet TAKE 1 TABLET(5  MG) BY MOUTH DAILY 90 tablet 3   aspirin 81 MG tablet Take 81 mg by mouth at bedtime.     atorvastatin (LIPITOR) 10 MG tablet TAKE 1 TABLET(10 MG) BY MOUTH DAILY 90 tablet 3   betamethasone valerate ointment (VALISONE) 0.1 % Apply 1 Application topically 2 (two) times daily. Place in the affected area twice a bid for 2 weeks and then place on the area at night twice weekly. 45 g 0   blood glucose meter kit and supplies KIT Dispense based on patient and insurance preference. Use up to four times daily as directed. 1 each 0   Carboxymethylcellul-Glycerin (LUBRICATING EYE DROPS OP) Place 1 drop into both eyes daily as needed (dry eyes).     cholecalciferol (VITAMIN D3) 25 MCG (1000 UT) tablet Take 1,000 Units by mouth daily.     citalopram (CELEXA) 20 MG tablet TAKE 1 TABLET(20 MG) BY MOUTH DAILY 90 tablet 1   diclofenac Sodium (VOLTAREN) 1 % GEL Apply 2 g topically 4 (four) times daily. 150 g 0   Emollient (GOLD BOND DIABETICS DRY SKIN) CREA Apply 1 application. topically at bedtime.     furosemide (LASIX) 20 MG tablet Take one tab daily x 3 days as needed for swelling in legs 90 tablet 0   glucose blood test strip Use to check blood glucose TID 200 each 0   Lancets 28G MISC Use to check blood glucose 4 times daily 100 each 0   levothyroxine (SYNTHROID) 88 MCG tablet TAKE 1 TABLET(88 MCG) BY MOUTH DAILY 90 tablet 3   Lidocaine 4 % AERO Apply 1 spray topically at bedtime as needed (foot pain).     losartan-hydrochlorothiazide (HYZAAR) 100-25 MG tablet TAKE 1 TABLET BY MOUTH DAILY 90 tablet 3   metFORMIN (GLUCOPHAGE) 1000 MG tablet Take 1 tablet (1,000 mg total) by mouth 2 (two) times daily with a meal. 180 tablet 3   metFORMIN  (GLUCOPHAGE) 500 MG tablet TAKE 1 TABLET BY MOUTH IN THE MORNING AND 2 TABLETS IN THE EVENING 270 tablet 0   pramipexole (MIRAPEX) 1.5 MG tablet TAKE 1 TABLET(1.5 MG) BY MOUTH AT BEDTIME 90 tablet 1   Prenatal Vit-Fe Fumarate-FA (PRENATAL VITAMIN PO) Take 1 tablet by mouth daily.     traMADol (ULTRAM) 50 MG tablet Take by mouth.     vitamin B-12 (CYANOCOBALAMIN) 1000 MCG tablet Take 1,000 mcg by mouth daily.     nitroGLYCERIN (NITROSTAT) 0.4 MG SL tablet Place 1 tablet (0.4 mg total) under the tongue every 5 (five) minutes as needed for chest pain. (Patient not taking: Reported on 04/29/2023) 25 tablet 3   No current facility-administered medications on file prior to visit.    BP 128/60   Pulse 70   Temp 98.2 F (36.8 C) (Oral)   Ht 5' 3.5" (1.613 m)   Wt 175 lb (79.4 kg)   SpO2 98%   BMI 30.51 kg/m       Objective:   Physical Exam Vitals and nursing note reviewed.  Constitutional:      Appearance: Normal appearance.  Musculoskeletal:     Right knee: Bony tenderness present. No swelling or crepitus. Tenderness present over the patellar tendon. No LCL laxity, MCL laxity or ACL laxity. Normal meniscus and normal patellar mobility.       Legs:  Neurological:     General: No focal deficit present.     Mental Status: She is alert and oriented to person, place, and time.     Gait:  Gait abnormal (limping gait).  Psychiatric:        Mood and Affect: Mood normal.        Behavior: Behavior normal.        Thought Content: Thought content normal.        Judgment: Judgment normal.       Assessment & Plan:  1. Chronic pain of right knee (Primary) -Her pain seems to be localized to the medial aspect but what does radiate down the Tibia. Will xray knee and tib/fib today. Refer to orthopedics and prescribe Mobic. Can consider MRI/PT in the future if needed - She has a cane at home and I would like her to start using that.  - DG Knee 1-2 Views Right; Future - DG Tibia/Fibula Right;  Future - AMB referral to orthopedics - meloxicam (MOBIC) 15 MG tablet; Take 1 tablet (15 mg total) by mouth daily.  Dispense: 30 tablet; Refill: 0  Shirline Frees, NP  Time spent with patient today was 32 minutes which consisted of chart review, discussing rigth knee pain and gait instability, work up, treatment answering questions and documentation.

## 2023-07-10 ENCOUNTER — Other Ambulatory Visit: Payer: Self-pay | Admitting: Adult Health

## 2023-07-10 DIAGNOSIS — I10 Essential (primary) hypertension: Secondary | ICD-10-CM

## 2023-07-17 ENCOUNTER — Other Ambulatory Visit: Payer: Self-pay | Admitting: Adult Health

## 2023-07-17 DIAGNOSIS — G2581 Restless legs syndrome: Secondary | ICD-10-CM

## 2023-07-20 ENCOUNTER — Other Ambulatory Visit: Payer: Self-pay | Admitting: Adult Health

## 2023-07-20 DIAGNOSIS — I10 Essential (primary) hypertension: Secondary | ICD-10-CM

## 2023-07-20 DIAGNOSIS — E782 Mixed hyperlipidemia: Secondary | ICD-10-CM

## 2023-07-23 ENCOUNTER — Other Ambulatory Visit: Payer: Self-pay | Admitting: Adult Health

## 2023-07-23 DIAGNOSIS — I10 Essential (primary) hypertension: Secondary | ICD-10-CM

## 2023-08-01 ENCOUNTER — Other Ambulatory Visit: Payer: Self-pay | Admitting: Adult Health

## 2023-08-01 DIAGNOSIS — G8929 Other chronic pain: Secondary | ICD-10-CM

## 2023-08-22 ENCOUNTER — Ambulatory Visit: Payer: Medicare Other | Admitting: Podiatry

## 2023-08-22 ENCOUNTER — Encounter: Payer: Self-pay | Admitting: Podiatry

## 2023-08-22 DIAGNOSIS — B351 Tinea unguium: Secondary | ICD-10-CM

## 2023-08-22 DIAGNOSIS — E1142 Type 2 diabetes mellitus with diabetic polyneuropathy: Secondary | ICD-10-CM | POA: Diagnosis not present

## 2023-08-22 DIAGNOSIS — M79675 Pain in left toe(s): Secondary | ICD-10-CM

## 2023-08-22 DIAGNOSIS — M79674 Pain in right toe(s): Secondary | ICD-10-CM | POA: Diagnosis not present

## 2023-08-22 NOTE — Progress Notes (Signed)
  Subjective:  Patient ID: [name], female    DOB: 05-04-1950,   MRN: [id number]  No chief complaint on file.   73 y.o. female presents for concern of thickened elongated and painful nails that are difficult to trim. Requesting to have them trimmed today. Relates burning and tingling in their feet. Patient is diabetic and last A1c was  Lab Results  Component Value Date   HGBA1C 7.4 (H) 06/13/2023   .   PCP:  Alto Atta, NP    . Denies any other pedal complaints. Denies n/v/f/c.   Past Medical History:  Diagnosis Date   Allergy    Anemia    Anxiety    ANXIETY DEPRESSION 09/15/2007   Arthritis    ASTHMA 05/15/2009   Asthma    DIABETES MELLITUS, TYPE II 12/11/2006   DIVERTICULOSIS, COLON 12/11/2006   Edema 01/15/2009   Gout    pt denies; toe was broken   Headache(784.0) 12/11/2006   Hyperlipidemia    Hypertension    HYPERTENSION NEC 08/14/2007   HYPOTHYROIDISM 12/11/2006   PELVIC PAIN, CHRONIC 08/14/2007   Restless leg syndrome     Objective:  Physical Exam: Vascular: DP/PT pulses 2/4 bilateral. CFT <3 seconds. Absent hair growth on digits. Edema noted to bilateral lower extremities. Xerosis noted bilaterally.  Skin. No lacerations or abrasions bilateral feet. Nails 1-5 bilateral  are thickened discolored and elongated with subungual debris. Eccymosis underlying right great toe.  Musculoskeletal: MMT 5/5 bilateral lower extremities in DF, PF, Inversion and Eversion. Deceased ROM in DF of ankle joint.  Neurological: Sensation intact to light touch. Protective sensation diminished bilateral.    Assessment:   1. Pain due to onychomycosis of toenails of both feet   2. Type 2 diabetes mellitus with peripheral neuropathy (HCC)      Plan:  Patient was evaluated and treated and all questions answered. -Discussed and educated patient on diabetic foot care, especially with  regards to the vascular, neurological and musculoskeletal systems.  -Stressed  the importance of good glycemic control and the detriment of not  controlling glucose levels in relation to the foot. -Discussed supportive shoes at all times and checking feet regularly.  -Mechanically debrided all nails 1-5 bilateral using sterile nail nipper and filed with dremel without incident  -Answered all patient questions -Patient to return  in 3 months for at risk foot care -Patient advised to call the office if any problems or questions arise in the meantime.   Jennefer Moats, DPM

## 2023-09-03 ENCOUNTER — Ambulatory Visit (INDEPENDENT_AMBULATORY_CARE_PROVIDER_SITE_OTHER): Admitting: Adult Health

## 2023-09-03 ENCOUNTER — Other Ambulatory Visit: Payer: Self-pay | Admitting: Adult Health

## 2023-09-03 VITALS — BP 128/70 | HR 65 | Temp 97.5°F | Ht 63.5 in | Wt 175.0 lb

## 2023-09-03 DIAGNOSIS — R110 Nausea: Secondary | ICD-10-CM | POA: Diagnosis not present

## 2023-09-03 DIAGNOSIS — K219 Gastro-esophageal reflux disease without esophagitis: Secondary | ICD-10-CM

## 2023-09-03 DIAGNOSIS — R55 Syncope and collapse: Secondary | ICD-10-CM | POA: Diagnosis not present

## 2023-09-03 MED ORDER — ONDANSETRON HCL 4 MG PO TABS: 4.0000 mg | ORAL_TABLET | Freq: Three times a day (TID) | ORAL | 0 refills | Status: AC | PRN

## 2023-09-03 MED ORDER — OMEPRAZOLE 20 MG PO CPDR: 20.0000 mg | DELAYED_RELEASE_CAPSULE | Freq: Every day | ORAL | 0 refills | Status: DC

## 2023-09-03 NOTE — Progress Notes (Signed)
 Subjective:    Patient ID: [name], female    DOB: [birth year], 73 y.o.   MRN: [id number]  HPI 73 year old female who  has a past medical history of Allergy, Anemia, Anxiety, ANXIETY DEPRESSION (09/15/2007), Arthritis, ASTHMA (05/15/2009), Asthma, DIABETES MELLITUS, TYPE II (12/11/2006), DIVERTICULOSIS, COLON (12/11/2006), Edema (01/15/2009), Gout, Headache(784.0) (12/11/2006), Hyperlipidemia, Hypertension, HYPERTENSION NEC (08/14/2007), HYPOTHYROIDISM (12/11/2006), PELVIC PAIN, CHRONIC (08/14/2007), and Restless leg syndrome.  She presents to the office today for an acute issue.   She reports that 3 days ago she was giving blood and got about 30 minutes and then passed out for an unknown amount of time.   She remembers a lot of people standing around her when she woke up.  She did not vomit or feel dizzy.  Since that time she has had a little nausea especially after eating.  She does report not staying well-hydrated prior to giving blood.   Review of Systems See HPI   Past Medical History:  Diagnosis Date   Allergy    Anemia    Anxiety    ANXIETY DEPRESSION 09/15/2007   Arthritis    ASTHMA 05/15/2009   Asthma    DIABETES MELLITUS, TYPE II 12/11/2006   DIVERTICULOSIS, COLON 12/11/2006   Edema 01/15/2009   Gout    pt denies; toe was broken   Headache(784.0) 12/11/2006   Hyperlipidemia    Hypertension    HYPERTENSION NEC 08/14/2007   HYPOTHYROIDISM 12/11/2006   PELVIC PAIN, CHRONIC 08/14/2007   Restless leg syndrome     Social History   Socioeconomic History   Marital status: Widowed    Spouse name: Not on file   Number of children: Not on file   Years of education: Not on file   Highest education level: Never attended school  Occupational History   Not on file  Tobacco Use   Smoking status: Never   Smokeless tobacco: Never  Vaping Use   Vaping status: Never Used  Substance and Sexual Activity   Alcohol use: Yes    Comment: wine occ   Drug use:  No   Sexual activity: Not Currently    Comment: hysterectomy, <5 sexual partners, <18 y/o, no STD  Other Topics Concern   Not on file  Social History Narrative   Not on file   Social Drivers of Health   Financial Resource Strain: Low Risk  (08/07/2023)   Received [facility]   Overall Financial Resource Strain (CARDIA)    Difficulty of Paying Living Expenses: Not very hard  Food Insecurity: Food Insecurity Present (08/07/2023)   Received from [facility]   Hunger Vital Sign    Worried About Running Out of Food in the Last Year: Sometimes true    Ran Out of Food in the Last Year: Never true  Transportation Needs: No Transportation Needs (08/07/2023)   Received from [facility] - Transportation    Lack of Transportation (Medical): No    Lack of Transportation (Non-Medical): No  Physical Activity: Insufficiently Active (08/07/2023)   Received from [facility]   Exercise Vital Sign    Days of Exercise per Week: 3 days    Minutes of Exercise per Session: 10 min  Stress: No Stress Concern Present (08/07/2023)   Received from [facility] - Occupational Stress Questionnaire    Feeling of Stress : Only a little  Social Connections: Socially Integrated (08/07/2023)   Received from [facility]  Social Network    How would you rate your social network (family, work, friends)?: Good participation with social networks  Intimate Partner Violence: Not At Risk (08/07/2023)   Received from [facility]   HITS    Over the last 12 months how often did your partner physically hurt you?: Never    Over the last 12 months how often did your partner insult you or talk down to you?: Never    Over the last 12 months how often did your partner threaten you with physical harm?: Never    Over the last 12 months how often did your partner scream or curse at you?: Never    Past Surgical History:  Procedure Laterality Date   ABDOMINAL  HYSTERECTOMY     APPENDECTOMY     Bladder Suspension     COLONOSCOPY  10/08/2017   Dr. Elvin Hammer   LEFT HEART CATH AND CORONARY ANGIOGRAPHY N/A 09/05/2021   Procedure: LEFT HEART CATH AND CORONARY ANGIOGRAPHY;  Surgeon: Millicent Ally, MD;  Location: [facility] INVASIVE CV LAB;  Service: Cardiovascular;  Laterality: N/A;    Family History  Problem Relation Age of Onset   COPD Mother        smoker   Diabetes Mother    COPD Father        smoker   Diabetes Father    Diabetes Sister    Diabetes Mellitus II Sister    Diabetes Sister    Diabetes Sister    Diabetes Sister    Lung cancer Nephew 60   Breast cancer Other 66   Colon cancer Neg Hx    Esophageal cancer Neg Hx    Pancreatic cancer Neg Hx    Rectal cancer Neg Hx    Stomach cancer Neg Hx     Allergies  Allergen Reactions   Benadryl [Diphenhydramine Hcl] Anaphylaxis   Ace Inhibitors Cough   Amoxicillin     Unknown reaction   Aspirin  Nausea And Vomiting    Tolerates low dose aspirin     Codeine Nausea And Vomiting   Fish Allergy Nausea And Vomiting   Hydrocodone  Nausea And Vomiting   Indomethacin     Unknown reaction    Pentazocine Lactate     passing out   Promethazine Hcl     Unknown reaction    Quinine     itching redface   Chlorphen-Phenyleph-Methscop Rash    Current Outpatient Medications on File Prior to Visit  Medication Sig Dispense Refill   acetaminophen  (TYLENOL ) 500 MG tablet Take 1,000 mg by mouth every 8 (eight) hours as needed for moderate pain (pain score 4-6).     albuterol  (VENTOLIN  HFA) 108 (90 Base) MCG/ACT inhaler Inhale 2 puffs into the lungs every 6 (six) hours as needed for wheezing or shortness of breath. 8 g 0   ALPRAZolam  (XANAX ) 0.25 MG tablet Take 1 tablet (0.25 mg total) by mouth 2 (two) times daily as needed for anxiety. 60 tablet 0   amLODipine  (NORVASC ) 5 MG tablet TAKE 1 TABLET(5 MG) BY MOUTH DAILY 30 tablet 0   aspirin  81 MG tablet Take 81 mg by mouth at bedtime.     atorvastatin   (LIPITOR) 10 MG tablet TAKE 1 TABLET(10 MG) BY MOUTH DAILY 90 tablet 3   betamethasone  valerate ointment (VALISONE ) 0.1 % Apply 1 Application topically 2 (two) times daily. Place in the affected area twice a bid for 2 weeks and then place on the area at night twice weekly. 45 g 0  blood glucose meter kit and supplies KIT Dispense based on patient and insurance preference. Use up to four times daily as directed. 1 each 0   Carboxymethylcellul-Glycerin (LUBRICATING EYE DROPS OP) Place 1 drop into both eyes daily as needed (dry eyes).     cholecalciferol (VITAMIN D3) 25 MCG (1000 UT) tablet Take 1,000 Units by mouth daily.     citalopram  (CELEXA ) 20 MG tablet TAKE 1 TABLET(20 MG) BY MOUTH DAILY 90 tablet 1   diclofenac  Sodium (VOLTAREN ) 1 % GEL Apply 2 g topically 4 (four) times daily. 150 g 0   Emollient (GOLD BOND DIABETICS DRY SKIN) CREA Apply 1 application. topically at bedtime.     furosemide  (LASIX ) 20 MG tablet Take one tab daily x 3 days as needed for swelling in legs 90 tablet 0   glucose blood test strip Use to check blood glucose TID 200 each 0   Lancets 28G MISC Use to check blood glucose 4 times daily 100 each 0   levothyroxine  (SYNTHROID ) 88 MCG tablet TAKE 1 TABLET(88 MCG) BY MOUTH DAILY 90 tablet 3   Lidocaine  4 % AERO Apply 1 spray topically at bedtime as needed (foot pain).     losartan -hydrochlorothiazide  (HYZAAR) 100-25 MG tablet TAKE 1 TABLET BY MOUTH DAILY 90 tablet 3   meloxicam  (MOBIC ) 15 MG tablet TAKE 1 TABLET(15 MG) BY MOUTH DAILY 30 tablet 0   metFORMIN  (GLUCOPHAGE ) 1000 MG tablet Take 1 tablet (1,000 mg total) by mouth 2 (two) times daily with a meal. 180 tablet 3   pramipexole  (MIRAPEX ) 1.5 MG tablet TAKE 1 TABLET(1.5 MG) BY MOUTH AT BEDTIME 90 tablet 1   traMADol (ULTRAM) 50 MG tablet Take by mouth.     vitamin B-12 (CYANOCOBALAMIN) 1000 MCG tablet Take 1,000 mcg by mouth daily.     No current facility-administered medications on file prior to visit.    BP 128/70    Pulse 65   Temp (!) 97.5 F (36.4 C) (Oral)   Ht 5' 3.5" (1.613 m)   Wt 175 lb (79.4 kg)   SpO2 97%   BMI 30.51 kg/m       Objective:   Physical Exam Vitals and nursing note reviewed.  Constitutional:      Appearance: Normal appearance.  Cardiovascular:     Rate and Rhythm: Normal rate and regular rhythm.     Pulses: Normal pulses.     Heart sounds: Normal heart sounds.  Pulmonary:     Effort: Pulmonary effort is normal.     Breath sounds: Normal breath sounds.  Abdominal:     General: Abdomen is flat. Bowel sounds are normal. There is no distension.     Palpations: Abdomen is soft.     Tenderness: There is abdominal tenderness in the epigastric area.  Musculoskeletal:        General: Normal range of motion.  Skin:    General: Skin is warm and dry.     Capillary Refill: Capillary refill takes less than 2 seconds.  Neurological:     General: No focal deficit present.     Mental Status: She is alert and oriented to person, place, and time.  Psychiatric:        Mood and Affect: Mood normal.        Behavior: Behavior normal.        Thought Content: Thought content normal.        Judgment: Judgment normal.        Assessment & Plan:  1.  Vaso vagal episode (Primary) - I elieve vasovagal syncope while getting blood.  Her exam was benign today in the office except for some epigastric abdominal pain which I do not think is related. - Encouraged to stay hydrated   2. Gastroesophageal reflux disease without esophagitis  - omeprazole (PRILOSEC) 20 MG capsule; Take 1 capsule (20 mg total) by mouth daily.  Dispense: 30 capsule; Refill: 0  3. Nausea  - ondansetron  (ZOFRAN ) 4 MG tablet; Take 1 tablet (4 mg total) by mouth every 8 (eight) hours as needed for nausea or vomiting.  Dispense: 20 tablet; Refill: 0  Alto Atta, NP  Time spent with patient today was 42 minutes which consisted of chart review, discussing vaso vagal syncope, GERD, nausea work up, treatment,  listening, answering questions and documentation.

## 2023-09-13 ENCOUNTER — Ambulatory Visit: Admitting: Adult Health

## 2023-09-13 VITALS — BP 110/70 | HR 70 | Temp 98.3°F | Ht 63.5 in | Wt 174.0 lb

## 2023-09-13 DIAGNOSIS — R42 Dizziness and giddiness: Secondary | ICD-10-CM | POA: Diagnosis not present

## 2023-09-13 DIAGNOSIS — R110 Nausea: Secondary | ICD-10-CM

## 2023-09-13 DIAGNOSIS — R2681 Unsteadiness on feet: Secondary | ICD-10-CM

## 2023-09-13 LAB — VITAMIN B12: Vitamin B-12: 966 pg/mL — ABNORMAL HIGH (ref 211–911)

## 2023-09-13 LAB — COMPREHENSIVE METABOLIC PANEL WITH GFR
ALT: 15 U/L (ref 0–35)
AST: 16 U/L (ref 0–37)
Albumin: 4.1 g/dL (ref 3.5–5.2)
Alkaline Phosphatase: 74 U/L (ref 39–117)
BUN: 24 mg/dL — ABNORMAL HIGH (ref 6–23)
CO2: 29 meq/L (ref 19–32)
Calcium: 9.7 mg/dL (ref 8.4–10.5)
Chloride: 99 meq/L (ref 96–112)
Creatinine, Ser: 1.01 mg/dL (ref 0.40–1.20)
GFR: 56.12 mL/min — ABNORMAL LOW (ref >60.00)
Glucose, Bld: 106 mg/dL — ABNORMAL HIGH (ref 70–99)
Potassium: 4.3 meq/L (ref 3.5–5.1)
Sodium: 138 meq/L (ref 135–145)
Total Bilirubin: 0.5 mg/dL (ref 0.2–1.2)
Total Protein: 6.9 g/dL (ref 6.0–8.3)

## 2023-09-13 LAB — CBC
HCT: 35.4 % — ABNORMAL LOW (ref 36.0–46.0)
Hemoglobin: 11.4 g/dL — ABNORMAL LOW (ref 12.0–15.0)
MCHC: 32.2 g/dL (ref 30.0–36.0)
MCV: 89.1 fl (ref 78.0–100.0)
Platelets: 331 10*3/uL (ref 150.0–400.0)
RBC: 3.97 Mil/uL (ref 3.87–5.11)
RDW: 15 % (ref 11.5–15.5)
WBC: 7.3 10*3/uL (ref 4.0–10.5)

## 2023-09-13 LAB — IBC + FERRITIN
Ferritin: 18.2 ng/mL (ref 10.0–291.0)
Iron: 33 ug/dL — ABNORMAL LOW (ref 42–145)
Saturation Ratios: 8.4 % — ABNORMAL LOW (ref 20.0–50.0)
TIBC: 392 ug/dL (ref 250.0–450.0)
Transferrin: 280 mg/dL (ref 212.0–360.0)

## 2023-09-13 LAB — VITAMIN D 25 HYDROXY (VIT D DEFICIENCY, FRACTURES): VITD: 35.11 ng/mL (ref 30.00–100.00)

## 2023-09-13 MED ORDER — SUCRALFATE 1 G PO TABS: 1.0000 g | ORAL_TABLET | Freq: Three times a day (TID) | ORAL | 0 refills | Status: DC

## 2023-09-13 NOTE — Progress Notes (Signed)
 Subjective:    Patient ID: [name], female    DOB: Jul 27, 1950, 73 y.o.   MRN: [id number]  HPI 73 year old female who  has a past medical history of Allergy, Anemia, Anxiety, ANXIETY DEPRESSION (09/15/2007), Arthritis, ASTHMA (05/15/2009), Asthma, DIABETES MELLITUS, TYPE II (12/11/2006), DIVERTICULOSIS, COLON (12/11/2006), Edema (01/15/2009), Gout, Headache(784.0) (12/11/2006), Hyperlipidemia, Hypertension, HYPERTENSION NEC (08/14/2007), HYPOTHYROIDISM (12/11/2006), PELVIC PAIN, CHRONIC (08/14/2007), and Restless leg syndrome.  She presents to the office today with her daughter  She was seen roughly 10 days ago after having a vasovagal response when she was getting blood [employer].  She reported at this time that since the vasovagal episode she was feeling nauseous especially after eating.  She did not have any episodes of vomiting.  On exam she had some epigastric tenderness and was prescribed Zofran  and omeprazole.  Today she reports that she has been taking her omeprazole daily with nausea seems to be getting worse.  Nausea has become more constant and she feels as though she would like to vomit but cannot.  Zofran  helps for a very short period of time.  She denies any changes in bowel movements, does not wake up with a sour taste in her mouth  Also complains of "feeling unsteady when I walk and feeling dizzy".  Symptoms are pretty constant.  Her dizziness feels different than her typical vertigo, stating feels like him on a ship rocking back and forth".  This has been ongoing since December 2024 when she slipped and fell hitting her head.  She was seen in the emergency room and CT scan was negative for acute abnormality.  Did have an MRI of the brain in August 2024 showed no acute intracranial abnormality.    Review of Systems See HPI   Past Medical History:  Diagnosis Date   Allergy    Anemia    Anxiety    ANXIETY DEPRESSION 09/15/2007   Arthritis    ASTHMA  05/15/2009   Asthma    DIABETES MELLITUS, TYPE II 12/11/2006   DIVERTICULOSIS, COLON 12/11/2006   Edema 01/15/2009   Gout    pt denies; toe was broken   Headache(784.0) 12/11/2006   Hyperlipidemia    Hypertension    HYPERTENSION NEC 08/14/2007   HYPOTHYROIDISM 12/11/2006   PELVIC PAIN, CHRONIC 08/14/2007   Restless leg syndrome     Social History   Socioeconomic History   Marital status: Widowed    Spouse name: Not on file   Number of children: Not on file   Years of education: Not on file   Highest education level: Never attended school  Occupational History   Not on file  Tobacco Use   Smoking status: Never   Smokeless tobacco: Never  Vaping Use   Vaping status: Never Used  Substance and Sexual Activity   Alcohol use: Yes    Comment: wine occ   Drug use: No   Sexual activity: Not Currently    Comment: hysterectomy, <5 sexual partners, <16 y/o, no STD  Other Topics Concern   Not on file  Social History Narrative   Not on file   Social Drivers of Health   Financial Resource Strain: Low Risk  (08/07/2023)   Received from [facility]   Overall Financial Resource Strain (CARDIA)    Difficulty of Paying Living Expenses: Not very hard  Food Insecurity: Food Insecurity Present (08/07/2023)   Received from [facility]   Hunger Vital Sign    Worried About Running Out  of Food in the Last Year: Sometimes true    Ran Out of Food in the Last Year: Never true  Transportation Needs: No Transportation Needs (08/07/2023)   Received from [facility] - Transportation    Lack of Transportation (Medical): No    Lack of Transportation (Non-Medical): No  Physical Activity: Insufficiently Active (08/07/2023)   Received from [facility]   Exercise Vital Sign    Days of Exercise per Week: 3 days    Minutes of Exercise per Session: 10 min  Stress: No Stress Concern Present (08/07/2023)   Received from [facility] -  Occupational Stress Questionnaire    Feeling of Stress : Only a little  Social Connections: Socially Integrated (08/07/2023)   Received from [facility]   Social Network    How would you rate your social network (family, work, friends)?: Good participation with social networks  Intimate Partner Violence: Not At Risk (08/07/2023)   Received from [facility]   HITS    Over the last 12 months how often did your partner physically hurt you?: Never    Over the last 12 months how often did your partner insult you or talk down to you?: Never    Over the last 12 months how often did your partner threaten you with physical harm?: Never    Over the last 12 months how often did your partner scream or curse at you?: Never    Past Surgical History:  Procedure Laterality Date   ABDOMINAL HYSTERECTOMY     APPENDECTOMY     Bladder Suspension     COLONOSCOPY  10/08/2017   Dr. Elvin Hammer   LEFT HEART CATH AND CORONARY ANGIOGRAPHY N/A 09/05/2021   Procedure: LEFT HEART CATH AND CORONARY ANGIOGRAPHY;  Surgeon: Millicent Ally, MD;  Location: [facility] INVASIVE CV LAB;  Service: Cardiovascular;  Laterality: N/A;    Family History  Problem Relation Age of Onset   COPD Mother        smoker   Diabetes Mother    COPD Father        smoker   Diabetes Father    Diabetes Sister    Diabetes Mellitus II Sister    Diabetes Sister    Diabetes Sister    Diabetes Sister    Lung cancer Nephew 60   Breast cancer Other 9   Colon cancer Neg Hx    Esophageal cancer Neg Hx    Pancreatic cancer Neg Hx    Rectal cancer Neg Hx    Stomach cancer Neg Hx     Allergies  Allergen Reactions   Benadryl [Diphenhydramine Hcl] Anaphylaxis   Ace Inhibitors Cough   Amoxicillin     Unknown reaction   Aspirin  Nausea And Vomiting    Tolerates low dose aspirin     Codeine Nausea And Vomiting   Fish Allergy Nausea And Vomiting   Hydrocodone  Nausea And Vomiting   Indomethacin     Unknown reaction    Pentazocine Lactate      passing out   Promethazine Hcl     Unknown reaction    Quinine     itching redface   Chlorphen-Phenyleph-Methscop Rash    Current Outpatient Medications on File Prior to Visit  Medication Sig Dispense Refill   acetaminophen  (TYLENOL ) 500 MG tablet Take 1,000 mg by mouth every 8 (eight) hours as needed for moderate pain (pain score 4-6).     albuterol  (VENTOLIN  HFA) 108 (90  Base) MCG/ACT inhaler Inhale 2 puffs into the lungs every 6 (six) hours as needed for wheezing or shortness of breath. 8 g 0   ALPRAZolam  (XANAX ) 0.25 MG tablet Take 1 tablet (0.25 mg total) by mouth 2 (two) times daily as needed for anxiety. 60 tablet 0   amLODipine  (NORVASC ) 5 MG tablet TAKE 1 TABLET(5 MG) BY MOUTH DAILY 30 tablet 0   aspirin  81 MG tablet Take 81 mg by mouth at bedtime.     atorvastatin  (LIPITOR) 10 MG tablet TAKE 1 TABLET(10 MG) BY MOUTH DAILY 90 tablet 3   betamethasone  valerate ointment (VALISONE ) 0.1 % Apply 1 Application topically 2 (two) times daily. Place in the affected area twice a bid for 2 weeks and then place on the area at night twice weekly. 45 g 0   blood glucose meter kit and supplies KIT Dispense based on patient and insurance preference. Use up to four times daily as directed. 1 each 0   Carboxymethylcellul-Glycerin (LUBRICATING EYE DROPS OP) Place 1 drop into both eyes daily as needed (dry eyes).     cholecalciferol (VITAMIN D3) 25 MCG (1000 UT) tablet Take 1,000 Units by mouth daily.     citalopram  (CELEXA ) 20 MG tablet TAKE 1 TABLET(20 MG) BY MOUTH DAILY 90 tablet 1   diclofenac  Sodium (VOLTAREN ) 1 % GEL Apply 2 g topically 4 (four) times daily. 150 g 0   Emollient (GOLD BOND DIABETICS DRY SKIN) CREA Apply 1 application. topically at bedtime.     furosemide  (LASIX ) 20 MG tablet Take one tab daily x 3 days as needed for swelling in legs 90 tablet 0   glucose blood test strip Use to check blood glucose TID 200 each 0   Lancets 28G MISC Use to check blood glucose 4 times daily 100 each  0   levothyroxine  (SYNTHROID ) 88 MCG tablet TAKE 1 TABLET(88 MCG) BY MOUTH DAILY 90 tablet 3   Lidocaine  4 % AERO Apply 1 spray topically at bedtime as needed (foot pain).     losartan -hydrochlorothiazide  (HYZAAR) 100-25 MG tablet TAKE 1 TABLET BY MOUTH DAILY 90 tablet 3   meloxicam  (MOBIC ) 15 MG tablet TAKE 1 TABLET(15 MG) BY MOUTH DAILY 30 tablet 0   metFORMIN  (GLUCOPHAGE ) 1000 MG tablet Take 1 tablet (1,000 mg total) by mouth 2 (two) times daily with a meal. 180 tablet 3   omeprazole  (PRILOSEC) 20 MG capsule Take 1 capsule (20 mg total) by mouth daily. 30 capsule 0   ondansetron  (ZOFRAN ) 4 MG tablet Take 1 tablet (4 mg total) by mouth every 8 (eight) hours as needed for nausea or vomiting. 20 tablet 0   pramipexole  (MIRAPEX ) 1.5 MG tablet TAKE 1 TABLET(1.5 MG) BY MOUTH AT BEDTIME 90 tablet 1   traMADol (ULTRAM) 50 MG tablet Take by mouth.     vitamin B-12 (CYANOCOBALAMIN) 1000 MCG tablet Take 1,000 mcg by mouth daily.     No current facility-administered medications on file prior to visit.    BP 110/70   Pulse 70   Temp 98.3 F (36.8 C) (Oral)   Ht 5' 3.5" (1.613 m)   Wt 174 lb (78.9 kg)   SpO2 96%   BMI 30.34 kg/m       Objective:   Physical Exam Vitals and nursing note reviewed.  Constitutional:      Appearance: Normal appearance.  Cardiovascular:     Rate and Rhythm: Normal rate and regular rhythm.     Pulses: Normal pulses.  Heart sounds: Normal heart sounds.  Pulmonary:     Effort: Pulmonary effort is normal.     Breath sounds: Normal breath sounds.  Abdominal:     General: Abdomen is flat. Bowel sounds are normal. There is no distension.     Palpations: Abdomen is soft.     Tenderness: There is no abdominal tenderness.  Musculoskeletal:        General: Normal range of motion.  Skin:    General: Skin is warm and dry.  Neurological:     General: No focal deficit present.     Mental Status: She is alert and oriented to person, place, and time.     Gait: Gait  abnormal (walking with cane).  Psychiatric:        Mood and Affect: Mood normal.        Behavior: Behavior normal.        Thought Content: Thought content normal.        Judgment: Judgment normal.        Assessment & Plan:  1. Dizziness (Primary) - Check labs today and repeat MRI of the brain since this has been an ongoing issue for at least 6 months.  Will wait for MRI to result but will likely send to neurology for further evaluation - CBC; Future - Comprehensive metabolic panel with GFR; Future - VITAMIN D  25 Hydroxy (Vit-D Deficiency, Fractures); Future - Vitamin B12; Future - IBC + Ferritin; Future - MR Brain Wo Contrast; Future - IBC + Ferritin - Vitamin B12 - VITAMIN D  25 Hydroxy (Vit-D Deficiency, Fractures) - Comprehensive metabolic panel with GFR - CBC  2. Gait instability  - CBC; Future - Comprehensive metabolic panel with GFR; Future - VITAMIN D  25 Hydroxy (Vit-D Deficiency, Fractures); Future - Vitamin B12; Future - IBC + Ferritin; Future - MR Brain Wo Contrast; Future - IBC + Ferritin - Vitamin B12 - VITAMIN D  25 Hydroxy (Vit-D Deficiency, Fractures) - Comprehensive metabolic panel with GFR - CBC  3. Nausea -Send in Carafate, continue with omeprazole.  She would like to be referred to gastroenterology - CBC; Future - Comprehensive metabolic panel with GFR; Future - VITAMIN D  25 Hydroxy (Vit-D Deficiency, Fractures); Future - Vitamin B12; Future - IBC + Ferritin; Future - sucralfate (CARAFATE) 1 g tablet; Take 1 tablet (1 g total) by mouth 4 (four) times daily -  with meals and at bedtime for 14 days.  Dispense: 56 tablet; Refill: 0 - Ambulatory referral to Gastroenterology - IBC + Ferritin - Vitamin B12 - VITAMIN D  25 Hydroxy (Vit-D Deficiency, Fractures) - Comprehensive metabolic panel with GFR - CBC  Alto Atta, NP  Time spent with patient today was 41 minutes which consisted of chart review, discussing nausea, dizziness, and gait  instability, work up, treatment answering questions and documentation.

## 2023-09-17 ENCOUNTER — Ambulatory Visit

## 2023-09-17 ENCOUNTER — Ambulatory Visit: Payer: Self-pay | Admitting: Adult Health

## 2023-09-17 DIAGNOSIS — I6782 Cerebral ischemia: Secondary | ICD-10-CM | POA: Diagnosis not present

## 2023-09-17 DIAGNOSIS — R42 Dizziness and giddiness: Secondary | ICD-10-CM | POA: Diagnosis not present

## 2023-09-17 DIAGNOSIS — R2681 Unsteadiness on feet: Secondary | ICD-10-CM

## 2023-09-20 ENCOUNTER — Encounter: Payer: Self-pay | Admitting: Adult Health

## 2023-09-20 ENCOUNTER — Ambulatory Visit: Admitting: Adult Health

## 2023-09-20 VITALS — BP 120/60 | HR 89 | Temp 98.1°F | Ht 63.5 in | Wt 171.0 lb

## 2023-09-20 DIAGNOSIS — J069 Acute upper respiratory infection, unspecified: Secondary | ICD-10-CM

## 2023-09-20 MED ORDER — BENZONATATE 200 MG PO CAPS: 200.0000 mg | ORAL_CAPSULE | Freq: Three times a day (TID) | ORAL | 0 refills | Status: DC | PRN

## 2023-09-20 MED ORDER — ALBUTEROL SULFATE HFA 108 (90 BASE) MCG/ACT IN AERS
2.0000 | INHALATION_SPRAY | Freq: Four times a day (QID) | RESPIRATORY_TRACT | 0 refills | Status: AC | PRN
Start: 2023-09-20 — End: ?

## 2023-09-20 MED ORDER — HYDROCODONE BIT-HOMATROP MBR 5-1.5 MG/5ML PO SOLN: 5.0000 mL | Freq: Three times a day (TID) | ORAL | 0 refills | Status: DC | PRN

## 2023-09-20 NOTE — Telephone Encounter (Signed)
 FYI

## 2023-09-20 NOTE — Progress Notes (Signed)
 Subjective:    Patient ID: [name], female    DOB: 04/30/1950, 73 y.o.   MRN: [id number]  Cough   73 year old female who  has a past medical history of Allergy, Anemia, Anxiety, ANXIETY DEPRESSION (09/15/2007), Arthritis, ASTHMA (05/15/2009), Asthma, DIABETES MELLITUS, TYPE II (12/11/2006), DIVERTICULOSIS, COLON (12/11/2006), Edema (01/15/2009), Gout, Headache(784.0) (12/11/2006), Hyperlipidemia, Hypertension, HYPERTENSION NEC (08/14/2007), HYPOTHYROIDISM (12/11/2006), PELVIC PAIN, CHRONIC (08/14/2007), and Restless leg syndrome.  She presents to the office today for an acute issue. She reports that two days ago she developed a productive cough with yellow sputum. She also endorses shortness of breath and headache. . She has not had any fevers, chills, sinus pain or pressure. No other family members are sick.   Review of Systems  Respiratory:  Positive for cough.    See HPI   Past Medical History:  Diagnosis Date   Allergy    Anemia    Anxiety    ANXIETY DEPRESSION 09/15/2007   Arthritis    ASTHMA 05/15/2009   Asthma    DIABETES MELLITUS, TYPE II 12/11/2006   DIVERTICULOSIS, COLON 12/11/2006   Edema 01/15/2009   Gout    pt denies; toe was broken   Headache(784.0) 12/11/2006   Hyperlipidemia    Hypertension    HYPERTENSION NEC 08/14/2007   HYPOTHYROIDISM 12/11/2006   PELVIC PAIN, CHRONIC 08/14/2007   Restless leg syndrome     Social History   Socioeconomic History   Marital status: Widowed    Spouse name: Not on file   Number of children: Not on file   Years of education: Not on file   Highest education level: Never attended school  Occupational History   Not on file  Tobacco Use   Smoking status: Never   Smokeless tobacco: Never  Vaping Use   Vaping status: Never Used  Substance and Sexual Activity   Alcohol use: Yes    Comment: wine occ   Drug use: No   Sexual activity: Not Currently    Comment: hysterectomy, <5 sexual partners, <16 y/o,  no STD  Other Topics Concern   Not on file  Social History Narrative   Not on file   Social Drivers of Health   Financial Resource Strain: Low Risk  (08/07/2023)   Received from [facility]   Overall Financial Resource Strain (CARDIA)    Difficulty of Paying Living Expenses: Not very hard  Food Insecurity: Food Insecurity Present (08/07/2023)   Received from [facility]   Hunger Vital Sign    Worried About Running Out of Food in the Last Year: Sometimes true    Ran Out of Food in the Last Year: Never true  Transportation Needs: No Transportation Needs (08/07/2023)   Received from [facility] - Transportation    Lack of Transportation (Medical): No    Lack of Transportation (Non-Medical): No  Physical Activity: Insufficiently Active (08/07/2023)   Received from [facility]   Exercise Vital Sign    Days of Exercise per Week: 3 days    Minutes of Exercise per Session: 10 min  Stress: No Stress Concern Present (08/07/2023)   Received from [facility] - Occupational Stress Questionnaire    Feeling of Stress : Only a little  Social Connections: Socially Integrated (08/07/2023)   Received from [facility]   Social Network    How would you rate your social network (family, work, friends)?: Good participation with social networks  Intimate  Partner Violence: Not At Risk (08/07/2023)   Received [facility]   HITS    Over the last 12 months how often did your partner physically hurt you?: Never    Over the last 12 months how often did your partner insult you or talk down to you?: Never    Over the last 12 months how often did your partner threaten you with physical harm?: Never    Over the last 12 months how often did your partner scream or curse at you?: Never    Past Surgical History:  Procedure Laterality Date   ABDOMINAL HYSTERECTOMY     APPENDECTOMY     Bladder Suspension     COLONOSCOPY  10/08/2017   Dr.  Elvin Hammer   LEFT HEART CATH AND CORONARY ANGIOGRAPHY N/A 09/05/2021   Procedure: LEFT HEART CATH AND CORONARY ANGIOGRAPHY;  Surgeon: Millicent Ally, MD;  Location: [facility] INVASIVE CV LAB;  Service: Cardiovascular;  Laterality: N/A;    Family History  Problem Relation Age of Onset   COPD Mother        smoker   Diabetes Mother    COPD Father        smoker   Diabetes Father    Diabetes Sister    Diabetes Mellitus II Sister    Diabetes Sister    Diabetes Sister    Diabetes Sister    Lung cancer Nephew 60   Breast cancer Other 22   Colon cancer Neg Hx    Esophageal cancer Neg Hx    Pancreatic cancer Neg Hx    Rectal cancer Neg Hx    Stomach cancer Neg Hx     Allergies  Allergen Reactions   Benadryl [Diphenhydramine Hcl] Anaphylaxis   Ace Inhibitors Cough   Amoxicillin     Unknown reaction   Aspirin  Nausea And Vomiting    Tolerates low dose aspirin     Codeine Nausea And Vomiting   Fish Allergy Nausea And Vomiting   Hydrocodone  Nausea And Vomiting   Indomethacin     Unknown reaction    Pentazocine Lactate     passing out   Promethazine Hcl     Unknown reaction    Quinine     itching redface   Chlorphen-Phenyleph-Methscop Rash    Current Outpatient Medications on File Prior to Visit  Medication Sig Dispense Refill   acetaminophen  (TYLENOL ) 500 MG tablet Take 1,000 mg by mouth every 8 (eight) hours as needed for moderate pain (pain score 4-6).     albuterol  (VENTOLIN  HFA) 108 (90 Base) MCG/ACT inhaler Inhale 2 puffs into the lungs every 6 (six) hours as needed for wheezing or shortness of breath. 8 g 0   ALPRAZolam  (XANAX ) 0.25 MG tablet Take 1 tablet (0.25 mg total) by mouth 2 (two) times daily as needed for anxiety. 60 tablet 0   amLODipine  (NORVASC ) 5 MG tablet TAKE 1 TABLET(5 MG) BY MOUTH DAILY 30 tablet 0   aspirin  81 MG tablet Take 81 mg by mouth at bedtime.     atorvastatin  (LIPITOR) 10 MG tablet TAKE 1 TABLET(10 MG) BY MOUTH DAILY 90 tablet 3   betamethasone  valerate  ointment (VALISONE ) 0.1 % Apply 1 Application topically 2 (two) times daily. Place in the affected area twice a bid for 2 weeks and then place on the area at night twice weekly. 45 g 0   blood glucose meter kit and supplies KIT Dispense based on patient and insurance preference. Use up to four times daily as  directed. 1 each 0   Carboxymethylcellul-Glycerin (LUBRICATING EYE DROPS OP) Place 1 drop into both eyes daily as needed (dry eyes).     cholecalciferol (VITAMIN D3) 25 MCG (1000 UT) tablet Take 1,000 Units by mouth daily.     citalopram  (CELEXA ) 20 MG tablet TAKE 1 TABLET(20 MG) BY MOUTH DAILY 90 tablet 1   diclofenac  Sodium (VOLTAREN ) 1 % GEL Apply 2 g topically 4 (four) times daily. 150 g 0   Emollient (GOLD BOND DIABETICS DRY SKIN) CREA Apply 1 application. topically at bedtime.     furosemide  (LASIX ) 20 MG tablet Take one tab daily x 3 days as needed for swelling in legs 90 tablet 0   glucose blood test strip Use to check blood glucose TID 200 each 0   Lancets 28G MISC Use to check blood glucose 4 times daily 100 each 0   levothyroxine  (SYNTHROID ) 88 MCG tablet TAKE 1 TABLET(88 MCG) BY MOUTH DAILY 90 tablet 3   Lidocaine  4 % AERO Apply 1 spray topically at bedtime as needed (foot pain).     losartan -hydrochlorothiazide  (HYZAAR) 100-25 MG tablet TAKE 1 TABLET BY MOUTH DAILY 90 tablet 3   meloxicam  (MOBIC ) 15 MG tablet TAKE 1 TABLET(15 MG) BY MOUTH DAILY 30 tablet 0   metFORMIN  (GLUCOPHAGE ) 1000 MG tablet Take 1 tablet (1,000 mg total) by mouth 2 (two) times daily with a meal. 180 tablet 3   omeprazole  (PRILOSEC) 20 MG capsule Take 1 capsule (20 mg total) by mouth daily. 30 capsule 0   ondansetron  (ZOFRAN ) 4 MG tablet Take 1 tablet (4 mg total) by mouth every 8 (eight) hours as needed for nausea or vomiting. 20 tablet 0   pramipexole  (MIRAPEX ) 1.5 MG tablet TAKE 1 TABLET(1.5 MG) BY MOUTH AT BEDTIME 90 tablet 1   sucralfate  (CARAFATE ) 1 g tablet Take 1 tablet (1 g total) by mouth 4 (four)  times daily -  with meals and at bedtime for 14 days. 56 tablet 0   traMADol (ULTRAM) 50 MG tablet Take by mouth.     vitamin B-12 (CYANOCOBALAMIN) 1000 MCG tablet Take 1,000 mcg by mouth daily.     No current facility-administered medications on file prior to visit.    BP 120/60   Pulse 89   Temp 98.1 F (36.7 C) (Oral)   Ht 5' 3.5" (1.613 m)   Wt 171 lb (77.6 kg)   SpO2 95%   BMI 29.82 kg/m       Objective:   Physical Exam Vitals and nursing note reviewed.  Constitutional:      Appearance: Normal appearance.  HENT:     Nose: Nose normal. No congestion or rhinorrhea.     Right Turbinates: Not enlarged or swollen.     Left Turbinates: Not enlarged or swollen.     Right Sinus: No maxillary sinus tenderness or frontal sinus tenderness.     Left Sinus: No maxillary sinus tenderness or frontal sinus tenderness.     Mouth/Throat:     Mouth: Mucous membranes are moist.     Pharynx: Oropharynx is clear. No oropharyngeal exudate.  Cardiovascular:     Rate and Rhythm: Normal rate and regular rhythm.     Pulses: Normal pulses.     Heart sounds: Normal heart sounds.  Pulmonary:     Effort: Pulmonary effort is normal.     Breath sounds: Normal breath sounds.  Musculoskeletal:        General: Normal range of motion.  Skin:  General: Skin is warm and dry.  Neurological:     General: No focal deficit present.     Mental Status: She is alert and oriented to person, place, and time.  Psychiatric:        Mood and Affect: Mood normal.        Behavior: Behavior normal.        Thought Content: Thought content normal.        Judgment: Judgment normal.       Assessment & Plan:  1. Upper respiratory tract infection, unspecified type (Primary) - albuterol  (VENTOLIN  HFA) 108 (90 Base) MCG/ACT inhaler; Inhale 2 puffs into the lungs every 6 (six) hours as needed for wheezing or shortness of breath.  Dispense: 8 g; Refill: 0 - benzonatate  (TESSALON ) 200 MG capsule; Take 1 capsule (200  mg total) by mouth 3 (three) times daily as needed.  Dispense: 30 capsule; Refill: 0 - HYDROcodone  bit-homatropine (HYCODAN) 5-1.5 MG/5ML syrup; Take 5 mLs by mouth every 8 (eight) hours as needed for cough.  Dispense: 120 mL; Refill: 0  Alto Atta, NP  Time spent with patient today was 31 minutes which consisted of chart review, discussing diagnosis, work up, treatment answering questions and documentation.

## 2023-09-23 ENCOUNTER — Telehealth: Payer: Self-pay

## 2023-09-23 NOTE — Telephone Encounter (Signed)
 Copied from CRM [id number]. Topic: General - Other >> Sep 23, 2023  9:41 AM Howard Macho wrote: Reason for CRM: alex from [employer] called stated the patient is enrolled in the chronic special needs plan and Fabio Holts would like to know if the patient has chronic heart failure, diabetes and cardiovascular diease CB [phone] Claim# [id number]

## 2023-09-25 NOTE — Telephone Encounter (Signed)
 Spoke to pt and she stated that she does not know anything about this program. Pt stated that she has been getting calls from different people that she think are scams. PT stated that's she did sign up for some program at the hospital through medicare but that's it. Pt advised that I disregard the message. Closing note.

## 2023-10-03 ENCOUNTER — Other Ambulatory Visit: Payer: Self-pay | Admitting: Adult Health

## 2023-10-03 DIAGNOSIS — J069 Acute upper respiratory infection, unspecified: Secondary | ICD-10-CM

## 2023-10-09 NOTE — Progress Notes (Signed)
 Chief Complaint: Primary GI MD: Dr. Abran  HPI:  *** is a  ***  who was referred to me by Merna Huxley, NP for a complaint of *** .    Recent labs with mild anemia, hemoglobin 11.4, MCV 89.1, isolated elevated BUN of 24.  Iron 33, ferritin 18.2, saturation 8.4%  Discussed the use of AI scribe software for clinical note transcription with the patient, who gave verbal consent to proceed.  History of Present Illness      PREVIOUS GI WORKUP   Colonoscopy 09/20/2022 - One 10 mm polyp in the sigmoid colon, removed with a hot snare. Resected and retrieved. - Diverticulosis in the sigmoid colon and in the right colon. Sigmoid stenosis  - The examination was otherwise normal on direct and retroflexion views. - Biopsy does inflammatory polyp with no recommendation for repeat colonoscopy  Past Medical History:  Diagnosis Date   Allergy    Anemia    Anxiety    ANXIETY DEPRESSION 09/15/2007   Arthritis    ASTHMA 05/15/2009   Asthma    DIABETES MELLITUS, TYPE II 12/11/2006   DIVERTICULOSIS, COLON 12/11/2006   Edema 01/15/2009   Gout    pt denies; toe was broken   Headache(784.0) 12/11/2006   Hyperlipidemia    Hypertension    HYPERTENSION NEC 08/14/2007   HYPOTHYROIDISM 12/11/2006   PELVIC PAIN, CHRONIC 08/14/2007   Restless leg syndrome     Past Surgical History:  Procedure Laterality Date   ABDOMINAL HYSTERECTOMY     APPENDECTOMY     Bladder Suspension     COLONOSCOPY  10/08/2017   Dr. Abran   LEFT HEART CATH AND CORONARY ANGIOGRAPHY N/A 09/05/2021   Procedure: LEFT HEART CATH AND CORONARY ANGIOGRAPHY;  Surgeon: Burnard Debby LABOR, MD;  Location: [facility];  Service: Cardiovascular;  Laterality: N/A;    Current Outpatient Medications  Medication Sig Dispense Refill   acetaminophen  (TYLENOL ) 500 MG tablet Take 1,000 mg by mouth every 8 (eight) hours as needed for moderate pain (pain score 4-6).     albuterol  (VENTOLIN  HFA) 108 (90 Base) MCG/ACT inhaler Inhale 2  puffs into the lungs every 6 (six) hours as needed for wheezing or shortness of breath. 8 g 0   ALPRAZolam  (XANAX ) 0.25 MG tablet Take 1 tablet (0.25 mg total) by mouth 2 (two) times daily as needed for anxiety. 60 tablet 0   amLODipine  (NORVASC ) 5 MG tablet TAKE 1 TABLET(5 MG) BY MOUTH DAILY 30 tablet 0   aspirin  81 MG tablet Take 81 mg by mouth at bedtime.     atorvastatin  (LIPITOR) 10 MG tablet TAKE 1 TABLET(10 MG) BY MOUTH DAILY 90 tablet 3   benzonatate  (TESSALON ) 200 MG capsule Take 1 capsule (200 mg total) by mouth 3 (three) times daily as needed. 30 capsule 0   betamethasone  valerate ointment (VALISONE ) 0.1 % Apply 1 Application topically 2 (two) times daily. Place in the affected area twice a bid for 2 weeks and then place on the area at night twice weekly. 45 g 0   blood glucose meter kit and supplies KIT Dispense based on patient and insurance preference. Use up to four times daily as directed. 1 each 0   Carboxymethylcellul-Glycerin (LUBRICATING EYE DROPS OP) Place 1 drop into both eyes daily as needed (dry eyes).     cholecalciferol (VITAMIN D3) 25 MCG (1000 UT) tablet Take 1,000 Units by mouth daily.     citalopram  (CELEXA ) 20 MG tablet TAKE 1 TABLET(20 MG)  BY MOUTH DAILY 90 tablet 1   diclofenac  Sodium (VOLTAREN ) 1 % GEL Apply 2 g topically 4 (four) times daily. 150 g 0   Emollient (GOLD BOND DIABETICS DRY SKIN) CREA Apply 1 application. topically at bedtime.     furosemide  (LASIX ) 20 MG tablet Take one tab daily x 3 days as needed for swelling in legs 90 tablet 0   glucose blood test strip Use to check blood glucose TID 200 each 0   HYDROcodone  bit-homatropine (HYCODAN) 5-1.5 MG/5ML syrup Take 5 mLs by mouth every 8 (eight) hours as needed for cough. 120 mL 0   Lancets 28G MISC Use to check blood glucose 4 times daily 100 each 0   levothyroxine  (SYNTHROID ) 88 MCG tablet TAKE 1 TABLET(88 MCG) BY MOUTH DAILY 90 tablet 3   Lidocaine  4 % AERO Apply 1 spray topically at bedtime as needed  (foot pain).     losartan -hydrochlorothiazide  (HYZAAR) 100-25 MG tablet TAKE 1 TABLET BY MOUTH DAILY 90 tablet 3   meloxicam  (MOBIC ) 15 MG tablet TAKE 1 TABLET(15 MG) BY MOUTH DAILY 30 tablet 0   metFORMIN  (GLUCOPHAGE ) 1000 MG tablet Take 1 tablet (1,000 mg total) by mouth 2 (two) times daily with a meal. 180 tablet 3   omeprazole  (PRILOSEC) 20 MG capsule Take 1 capsule (20 mg total) by mouth daily. 30 capsule 0   ondansetron  (ZOFRAN ) 4 MG tablet Take 1 tablet (4 mg total) by mouth every 8 (eight) hours as needed for nausea or vomiting. 20 tablet 0   pramipexole  (MIRAPEX ) 1.5 MG tablet TAKE 1 TABLET(1.5 MG) BY MOUTH AT BEDTIME 90 tablet 1   sucralfate  (CARAFATE ) 1 g tablet Take 1 tablet (1 g total) by mouth 4 (four) times daily -  with meals and at bedtime for 14 days. 56 tablet 0   traMADol (ULTRAM) 50 MG tablet Take by mouth.     vitamin B-12 (CYANOCOBALAMIN ) 1000 MCG tablet Take 1,000 mcg by mouth daily.     No current facility-administered medications for this visit.    Allergies as of 10/10/2023 - Review Complete 09/20/2023  Allergen Reaction Noted   Benadryl [diphenhydramine hcl] Anaphylaxis 11/19/2014   Ace inhibitors Cough 09/22/2015   Amoxicillin     Aspirin  Nausea And Vomiting    Codeine Nausea And Vomiting    Fish allergy Nausea And Vomiting 07/24/2016   Hydrocodone  Nausea And Vomiting    Indomethacin     Pentazocine lactate     Promethazine hcl     Quinine     Chlorphen-phenyleph-methscop Rash     Family History  Problem Relation Age of Onset   COPD Mother        smoker   Diabetes Mother    COPD Father        smoker   Diabetes Father    Diabetes Sister    Diabetes Mellitus II Sister    Diabetes Sister    Diabetes Sister    Diabetes Sister    Lung cancer Nephew 60   Breast cancer Other 89   Colon cancer Neg Hx    Esophageal cancer Neg Hx    Pancreatic cancer Neg Hx    Rectal cancer Neg Hx    Stomach cancer Neg Hx     Social History   Socioeconomic  History   Marital status: Widowed    Spouse name: Not on file   Number of children: Not on file   Years of education: Not on file   Highest education level:  Never attended school  Occupational History   Not on file  Tobacco Use   Smoking status: Never   Smokeless tobacco: Never  Vaping Use   Vaping status: Never Used  Substance and Sexual Activity   Alcohol use: Yes    Comment: wine occ   Drug use: No   Sexual activity: Not Currently    Comment: hysterectomy, <5 sexual partners, <16 y/o, no STD  Other Topics Concern   Not on file  Social History Narrative   Not on file   Social Drivers of Health   Financial Resource Strain: Low Risk  (08/07/2023)   Received from [facility]   Overall Financial Resource Strain (CARDIA)    Difficulty of Paying Living Expenses: Not very hard  Food Insecurity: Food Insecurity Present (08/07/2023)   Received from [facility]   Hunger Vital Sign    Within the past 12 months, you worried that your food would run out before you got the money to buy more.: Sometimes true    Within the past 12 months, the food you bought just didn't last and you didn't have money to get more.: Never true  Transportation Needs: No Transportation Needs (08/07/2023)   Received from [facility] - Transportation    Lack of Transportation (Medical): No    Lack of Transportation (Non-Medical): No  Physical Activity: Insufficiently Active (08/07/2023)   Received from [facility]   Exercise Vital Sign    On average, how many days per week do you engage in moderate to strenuous exercise (like a brisk walk)?: 3 days    On average, how many minutes do you engage in exercise at this level?: 10 min  Stress: No Stress Concern Present (08/07/2023)   Received from [facility] - Occupational Stress Questionnaire    Feeling of Stress : Only a little  Social Connections: Socially Integrated (08/07/2023)   Received from  [facility]   Social Network    How would you rate your social network (family, work, friends)?: Good participation with social networks  Intimate Partner Violence: Not At Risk (08/07/2023)   Received from [facility]   HITS    Over the last 12 months how often did your partner physically hurt you?: Never    Over the last 12 months how often did your partner insult you or talk down to you?: Never    Over the last 12 months how often did your partner threaten you with physical harm?: Never    Over the last 12 months how often did your partner scream or curse at you?: Never    Review of Systems:    Constitutional: No weight loss, fever, chills, weakness or fatigue HEENT: Eyes: No change in vision               Ears, Nose, Throat:  No change in hearing or congestion Skin: No rash or itching Cardiovascular: No chest pain, chest pressure or palpitations   Respiratory: No SOB or cough Gastrointestinal: See HPI and otherwise negative Genitourinary: No dysuria or change in urinary frequency Neurological: No headache, dizziness or syncope Musculoskeletal: No new muscle or joint pain Hematologic: No bleeding or bruising Psychiatric: No history of depression or anxiety    Physical Exam:  Vital signs: There were no vitals taken for this visit.  Constitutional: NAD, alert and cooperative Head:  Normocephalic and atraumatic. Eyes:   PEERL, EOMI. No icterus. Conjunctiva pink. Respiratory: Respirations even and  unlabored. Lungs clear to auscultation bilaterally.   No wheezes, crackles, or rhonchi.  Cardiovascular:  Regular rate and rhythm. No peripheral edema, cyanosis or pallor.  Gastrointestinal:  Soft, nondistended, nontender. No rebound or guarding. Normal bowel sounds. No appreciable masses or hepatomegaly. Rectal:  Declines Msk:  Symmetrical without gross deformities. Without edema, no deformity or joint abnormality.  Neurologic:  Alert and  oriented x4;  grossly normal  neurologically.  Skin:   Dry and intact without significant lesions or rashes. Psychiatric: Oriented to person, place and time. Demonstrates good judgement and reason without abnormal affect or behaviors.  Physical Exam    RELEVANT LABS AND IMAGING: CBC    Component Value Date/Time   WBC 7.3 09/13/2023 1030   RBC 3.97 09/13/2023 1030   HGB 11.4 (L) 09/13/2023 1030   HCT 35.4 (L) 09/13/2023 1030   PLT 331.0 09/13/2023 1030   MCV 89.1 09/13/2023 1030   MCH 29.3 11/03/2019 0810   MCHC 32.2 09/13/2023 1030   RDW 15.0 09/13/2023 1030   LYMPHSABS 1.6 06/13/2023 0948   MONOABS 0.4 06/13/2023 0948   EOSABS 0.2 06/13/2023 0948   BASOSABS 0.1 06/13/2023 0948    CMP     Component Value Date/Time   NA 138 09/13/2023 1030   K 4.3 09/13/2023 1030   CL 99 09/13/2023 1030   CO2 29 09/13/2023 1030   GLUCOSE 106 (H) 09/13/2023 1030   BUN 24 (H) 09/13/2023 1030   CREATININE 1.01 09/13/2023 1030   CREATININE 0.86 11/03/2019 0810   CALCIUM  9.7 09/13/2023 1030   PROT 6.9 09/13/2023 1030   ALBUMIN 4.1 09/13/2023 1030   AST 16 09/13/2023 1030   ALT 15 09/13/2023 1030   ALKPHOS 74 09/13/2023 1030   BILITOT 0.5 09/13/2023 1030   GFRNONAA 80.83 09/28/2009 1030   GFRAA 84 06/23/2007 1056     Assessment/Plan:   Assessment and Plan Assessment & Plan    Iron deficiency anemia hemoglobin 11.4, MCV 89.1, isolated elevated BUN of 24.  Iron 33, ferritin 18.2, saturation 8.4%.  No anemia 1 year ago.  Recent colonoscopy 2024 with inflammatory polyp.  No previous EGD. - EGD for further evaluation of iron deficiency anemia (no need for colonoscopy as he just had one within the last year) - Recheck CBC, CMP, iron studies - PPI 40 Mg twice daily    Nestor Blower, PA-C [facility] 10/09/2023, 9:55 AM  Cc: Nafziger, Cory, NP

## 2023-10-10 ENCOUNTER — Encounter: Payer: Self-pay | Admitting: Gastroenterology

## 2023-10-10 ENCOUNTER — Ambulatory Visit: Admitting: Gastroenterology

## 2023-10-10 VITALS — BP 130/70 | HR 84 | Ht 63.5 in | Wt 174.0 lb

## 2023-10-10 DIAGNOSIS — D509 Iron deficiency anemia, unspecified: Secondary | ICD-10-CM

## 2023-10-10 DIAGNOSIS — R110 Nausea: Secondary | ICD-10-CM

## 2023-10-10 MED ORDER — PANTOPRAZOLE SODIUM 40 MG PO TBEC
40.0000 mg | DELAYED_RELEASE_TABLET | Freq: Every day | ORAL | 3 refills | Status: AC
Start: 2023-10-10 — End: ?

## 2023-10-10 NOTE — Patient Instructions (Addendum)
 You have been scheduled for an endoscopy. Please follow written instructions given to you at your visit today.  If you use inhalers (even only as needed), please bring them with you on the day of your procedure.  If you take any of the following medications, they will need to be adjusted prior to your procedure:   DO NOT TAKE 7 DAYS PRIOR TO TEST- Trulicity (dulaglutide) Ozempic, Wegovy (semaglutide) Mounjaro (tirzepatide) Bydureon Bcise (exanatide extended release)  DO NOT TAKE 1 DAY PRIOR TO YOUR TEST Rybelsus (semaglutide) Adlyxin (lixisenatide) Victoza (liraglutide) Byetta (exanatide) ___________________________________________________________________________    We have sent the following medications to your pharmacy for you to pick up at your convenience: Pantoprazole 40mg , take 1 tablet daily.   Thank you for trusting me with your gastrointestinal care!   Nestor Blower, PA  _______________________________________________________  If your blood pressure at your visit was 140/90 or greater, please contact your primary care physician to follow up on this.  _______________________________________________________  If you are age 42 or older, your body mass index should be between 23-30. Your Body mass index is 30.34 kg/m. If this is out of the aforementioned range listed, please consider follow up with your Primary Care Provider.  If you are age 36 or younger, your body mass index should be between 19-25. Your Body mass index is 30.34 kg/m. If this is out of the aformentioned range listed, please consider follow up with your Primary Care Provider.   ________________________________________________________  The [facility] providers would like to encourage you to use MYCHART to communicate with providers for non-urgent requests or questions.  Due to long hold times on the telephone, sending your provider a message by [facility] may be a faster and more efficient way to get a  response.  Please allow 48 business hours for a response.  Please remember that this is for non-urgent requests.  _______________________________________________________

## 2023-10-10 NOTE — Progress Notes (Signed)
 Noted

## 2023-10-22 MED ORDER — NA SULFATE-K SULFATE-MG SULF 17.5-3.13-1.6 GM/177ML PO SOLN: 1.0000 | Freq: Once | ORAL | 0 refills | Status: AC

## 2023-10-22 NOTE — Addendum Note (Signed)
 Addended by: WILLIEMAE JOLA PARAS on: 10/22/2023 01:45 PM   Modules accepted: Orders

## 2023-10-24 ENCOUNTER — Encounter: Payer: Self-pay | Admitting: Adult Health

## 2023-10-25 NOTE — Telephone Encounter (Signed)
**Note De-identified  [place] Obfuscation** Please advise 

## 2023-11-06 ENCOUNTER — Other Ambulatory Visit: Payer: Self-pay | Admitting: Adult Health

## 2023-11-06 DIAGNOSIS — Z760 Encounter for issue of repeat prescription: Secondary | ICD-10-CM

## 2023-11-13 ENCOUNTER — Encounter: Payer: Self-pay | Admitting: Internal Medicine

## 2023-11-14 ENCOUNTER — Encounter: Payer: Self-pay | Admitting: Adult Health

## 2023-11-14 ENCOUNTER — Ambulatory Visit (INDEPENDENT_AMBULATORY_CARE_PROVIDER_SITE_OTHER): Admitting: Adult Health

## 2023-11-14 VITALS — BP 120/60 | HR 73 | Temp 97.7°F | Ht 63.5 in | Wt 173.0 lb

## 2023-11-14 DIAGNOSIS — G8929 Other chronic pain: Secondary | ICD-10-CM

## 2023-11-14 DIAGNOSIS — E119 Type 2 diabetes mellitus without complications: Secondary | ICD-10-CM

## 2023-11-14 DIAGNOSIS — M25561 Pain in right knee: Secondary | ICD-10-CM | POA: Diagnosis not present

## 2023-11-14 DIAGNOSIS — Z7984 Long term (current) use of oral hypoglycemic drugs: Secondary | ICD-10-CM

## 2023-11-14 DIAGNOSIS — R2681 Unsteadiness on feet: Secondary | ICD-10-CM | POA: Diagnosis not present

## 2023-11-14 MED ORDER — TRANSFER BENCH MISC: 0 refills | Status: AC

## 2023-11-14 NOTE — Progress Notes (Signed)
 Subjective:    Patient ID: [name], female    DOB: 06/13/1950, 73 y.o.   MRN: [id number]  HPI  73 year old female who presents to the office today with her daughter for multiple issues  She has chronic right knee pain that has been ongoing for multiple months.  He has been seen by orthopedics at [facility], last visit on 08/08/2023.  X-rays showed  Standing AP, Rosenberg, and sunrise radiographs of both knees and a lateral radiograph of the right knee dated 08/08/2023 were personally reviewed and interpreted. There is severe right and moderate to severe left medial compartment joint space narrowing and subchondral sclerosis. There are small medial marginal osteophytes within the right knee. There is mild to moderate right and moderate left lateral compartment joint space narrowing. On the sunrise view, there is moderate to severe right and moderate left knee patellofemoral compartment joint space narrowing. On the lateral view of the right knee, there is an enthesophyte to the superior pole of the patella. No significant right knee joint effusion is present.   At this time it was felt that most of her pain was likely coming from the Pes Anserine Bursa and she was given a steroid injection which helped a majority of her pain.  She reports that most recently she knelt down and reinjured her right knee and it has been painful ever since, this was on October 24, 2023.  She reports that due to her knee pain she is having a hard time getting in and out of her shower and is wondering if she can get a transfer chair so that she does not fall getting in or out of the bath.  She would also like to be referred to a diabetic nutritionist so that she can get help with her diet and lower her A1c    Review of Systems See HPI   Past Medical History:  Diagnosis Date   Allergy    Anemia    Anxiety    ANXIETY DEPRESSION 09/15/2007   Arthritis    ASTHMA 05/15/2009   Asthma    DIABETES  MELLITUS, TYPE II 12/11/2006   DIVERTICULOSIS, COLON 12/11/2006   Edema 01/15/2009   Gout    pt denies; toe was broken   Headache(784.0) 12/11/2006   Hyperlipidemia    Hypertension    HYPERTENSION NEC 08/14/2007   HYPOTHYROIDISM 12/11/2006   PELVIC PAIN, CHRONIC 08/14/2007   Restless leg syndrome     Social History   Socioeconomic History   Marital status: Widowed    Spouse name: Not on file   Number of children: Not on file   Years of education: Not on file   Highest education level: 12th grade  Occupational History   Not on file  Tobacco Use   Smoking status: Never   Smokeless tobacco: Never  Vaping Use   Vaping status: Never Used  Substance and Sexual Activity   Alcohol use: Yes    Comment: wine occ   Drug use: No   Sexual activity: Not Currently    Comment: hysterectomy, <5 sexual partners, <16 y/o, no STD  Other Topics Concern   Not on file  Social History Narrative   Not on file   Social Drivers of Health   Financial Resource Strain: Low Risk  (11/12/2023)   Overall Financial Resource Strain (CARDIA)    Difficulty of Paying Living Expenses: Not very hard  Food Insecurity: Food Insecurity Present (11/12/2023)   Hunger Vital Sign  Worried About Programme researcher, broadcasting/film/video in the Last Year: Sometimes true    [employer] of Food in the Last Year: Sometimes true  Transportation Needs: No Transportation Needs (11/12/2023)   PRAPARE - Administrator, Civil Service (Medical): No    Lack of Transportation (Non-Medical): No  Physical Activity: Sufficiently Active (11/12/2023)   Exercise Vital Sign    Days of Exercise per Week: 2 days    Minutes of Exercise per Session: 150+ min  Stress: No Stress Concern Present (11/12/2023)   [employer] of Occupational Health - Occupational Stress Questionnaire    Feeling of Stress: Only a little  Social Connections: Moderately Integrated (11/12/2023)   Social Connection and Isolation Panel    Frequency of Communication  with Friends and Family: More than three times a week    Frequency of Social Gatherings with Friends and Family: More than three times a week    Attends Religious Services: More than 4 times per year    Active Member of [employer] or Organizations: Yes    Attends Banker Meetings: More than 4 times per year    Marital Status: Widowed  Intimate Partner Violence: Not At Risk (08/07/2023)   Received from [facility]   HITS    Over the last 12 months how often did your partner physically hurt you?: Never    Over the last 12 months how often did your partner insult you or talk down to you?: Never    Over the last 12 months how often did your partner threaten you with physical harm?: Never    Over the last 12 months how often did your partner scream or curse at you?: Never    Past Surgical History:  Procedure Laterality Date   ABDOMINAL HYSTERECTOMY     APPENDECTOMY     Bladder Suspension     COLONOSCOPY  10/08/2017   Dr. Abran   LEFT HEART CATH AND CORONARY ANGIOGRAPHY N/A 09/05/2021   Procedure: LEFT HEART CATH AND CORONARY ANGIOGRAPHY;  Surgeon: Burnard Debby LABOR, MD;  Location: [facility] INVASIVE CV LAB;  Service: Cardiovascular;  Laterality: N/A;    Family History  Problem Relation Age of Onset   COPD Mother        smoker   Diabetes Mother    COPD Father        smoker   Diabetes Father    Diabetes Sister    Diabetes Mellitus II Sister    Diabetes Sister    Diabetes Sister    Diabetes Sister    Lung cancer Nephew 60   Breast cancer Other 72   Colon cancer Neg Hx    Esophageal cancer Neg Hx    Pancreatic cancer Neg Hx    Rectal cancer Neg Hx    Stomach cancer Neg Hx     Allergies  Allergen Reactions   Benadryl [Diphenhydramine Hcl] Anaphylaxis   Ace Inhibitors Cough   Amoxicillin     Unknown reaction   Aspirin  Nausea And Vomiting    Tolerates low dose aspirin     Codeine Nausea And Vomiting   Fish Allergy Nausea And Vomiting   Hydrocodone  Nausea And Vomiting    Indomethacin     Unknown reaction    Pentazocine Lactate     passing out   Promethazine Hcl     Unknown reaction    Quinine     itching redface   Chlorphen-Phenyleph-Methscop Rash    Current Outpatient Medications on File Prior to  Visit  Medication Sig Dispense Refill   acetaminophen  (TYLENOL ) 500 MG tablet Take 1,000 mg by mouth every 8 (eight) hours as needed for moderate pain (pain score 4-6).     albuterol  (VENTOLIN  HFA) 108 (90 Base) MCG/ACT inhaler Inhale 2 puffs into the lungs every 6 (six) hours as needed for wheezing or shortness of breath. 8 g 0   ALPRAZolam  (XANAX ) 0.25 MG tablet Take 1 tablet (0.25 mg total) by mouth 2 (two) times daily as needed for anxiety. 60 tablet 0   amLODipine  (NORVASC ) 5 MG tablet TAKE 1 TABLET(5 MG) BY MOUTH DAILY 30 tablet 0   ascorbic acid (VITAMIN C) 500 MG tablet Take 500 mg by mouth daily.     aspirin  81 MG tablet Take 81 mg by mouth at bedtime.     atorvastatin  (LIPITOR) 10 MG tablet TAKE 1 TABLET(10 MG) BY MOUTH DAILY 90 tablet 3   betamethasone  valerate ointment (VALISONE ) 0.1 % Apply 1 Application topically 2 (two) times daily. Place in the affected area twice a bid for 2 weeks and then place on the area at night twice weekly. 45 g 0   blood glucose meter kit and supplies KIT Dispense based on patient and insurance preference. Use up to four times daily as directed. 1 each 0   Carboxymethylcellul-Glycerin (LUBRICATING EYE DROPS OP) Place 1 drop into both eyes daily as needed (dry eyes).     cholecalciferol (VITAMIN D3) 25 MCG (1000 UT) tablet Take 1,000 Units by mouth daily.     citalopram  (CELEXA ) 20 MG tablet TAKE 1 TABLET(20 MG) BY MOUTH DAILY 90 tablet 1   diclofenac  Sodium (VOLTAREN ) 1 % GEL Apply 2 g topically 4 (four) times daily. 150 g 0   Emollient (GOLD BOND DIABETICS DRY SKIN) CREA Apply 1 application. topically at bedtime.     furosemide  (LASIX ) 20 MG tablet Take one tab daily x 3 days as needed for swelling in legs 90 tablet 0    glucose blood test strip Use to check blood glucose TID 200 each 0   Iron Combinations (CHROMAGEN) capsule Take 1 capsule by mouth daily.     Lancets 28G MISC Use to check blood glucose 4 times daily 100 each 0   levothyroxine  (SYNTHROID ) 88 MCG tablet TAKE 1 TABLET(88 MCG) BY MOUTH DAILY 90 tablet 3   losartan -hydrochlorothiazide  (HYZAAR) 100-25 MG tablet TAKE 1 TABLET BY MOUTH DAILY 90 tablet 3   metFORMIN  (GLUCOPHAGE ) 1000 MG tablet Take 1 tablet (1,000 mg total) by mouth 2 (two) times daily with a meal. 180 tablet 3   ondansetron  (ZOFRAN ) 4 MG tablet Take 1 tablet (4 mg total) by mouth every 8 (eight) hours as needed for nausea or vomiting. 20 tablet 0   pantoprazole  (PROTONIX ) 40 MG tablet Take 1 tablet (40 mg total) by mouth daily. 30 tablet 3   pramipexole  (MIRAPEX ) 1.5 MG tablet TAKE 1 TABLET(1.5 MG) BY MOUTH AT BEDTIME 90 tablet 1   traMADol (ULTRAM) 50 MG tablet Take by mouth.     vitamin B-12 (CYANOCOBALAMIN ) 1000 MCG tablet Take 1,000 mcg by mouth daily.     ascorbic acid (VITAMIN C) 500 MG tablet Take 500 mg by mouth daily.     benzonatate  (TESSALON ) 200 MG capsule Take 1 capsule (200 mg total) by mouth 3 (three) times daily as needed. (Patient not taking: Reported on 10/10/2023) 30 capsule 0   HYDROcodone  bit-homatropine (HYCODAN) 5-1.5 MG/5ML syrup Take 5 mLs by mouth every 8 (eight) hours as needed for  cough. 120 mL 0   Lidocaine  4 % AERO Apply 1 spray topically at bedtime as needed (foot pain).     meloxicam  (MOBIC ) 15 MG tablet TAKE 1 TABLET(15 MG) BY MOUTH DAILY (Patient not taking: Reported on 10/10/2023) 30 tablet 0   omeprazole  (PRILOSEC) 20 MG capsule Take 1 capsule (20 mg total) by mouth daily. (Patient not taking: Reported on 10/10/2023) 30 capsule 0   sucralfate  (CARAFATE ) 1 g tablet Take 1 tablet (1 g total) by mouth 4 (four) times daily -  with meals and at bedtime for 14 days. (Patient not taking: Reported on 10/10/2023) 56 tablet 0   No current facility-administered  medications on file prior to visit.    BP 120/60   Pulse 73   Temp 97.7 F (36.5 C) (Oral)   Ht 5' 3.5 (1.613 m)   Wt 173 lb (78.5 kg)   SpO2 96%   BMI 30.16 kg/m       Objective:   Physical Exam Constitutional:      Appearance: Normal appearance. She is obese.  Cardiovascular:     Rate and Rhythm: Normal rate and regular rhythm.  Musculoskeletal:        General: Tenderness present. No swelling or deformity.     Right knee: Bony tenderness present. No swelling, deformity or crepitus. Normal range of motion.     Comments: He did not seem to have much discomfort at the Pes Anserine bursa, but did have tenderness along the lateral aspect of her patella.  Skin:    General: Skin is warm and dry.     Capillary Refill: Capillary refill takes less than 2 seconds.  Neurological:     General: No focal deficit present.     Mental Status: She is oriented to person, place, and time.  Psychiatric:        Mood and Affect: Mood normal.        Behavior: Behavior normal.        Thought Content: Thought content normal.        Judgment: Judgment normal.        Assessment & Plan:  1. Chronic pain of right knee (Primary) - Advised to follow up with orthopedic surgeon for further evaluation. 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 Devices (TRANSFER BENCH) MISC; Use when getting in and out of shower  Dispense: 1 each; Refill: 0  2. Gait instability  - Misc. Devices (TRANSFER BENCH) MISC; Use when getting in and out of shower  Dispense: 1 each; Refill: 0  3. Diabetes mellitus treated with oral medication (HCC)  - Amb Referral to Nutrition and Diabetic Education  Darleene Shape, NP

## 2023-11-18 ENCOUNTER — Other Ambulatory Visit: Payer: Self-pay | Admitting: Internal Medicine

## 2023-11-18 DIAGNOSIS — I10 Essential (primary) hypertension: Secondary | ICD-10-CM

## 2023-11-21 ENCOUNTER — Encounter: Payer: Self-pay | Admitting: Internal Medicine

## 2023-11-21 ENCOUNTER — Ambulatory Visit: Admitting: Internal Medicine

## 2023-11-21 VITALS — BP 108/51 | HR 59 | Temp 97.3°F | Resp 20 | Ht 63.5 in | Wt 174.0 lb

## 2023-11-21 DIAGNOSIS — K2940 Chronic atrophic gastritis without bleeding: Secondary | ICD-10-CM

## 2023-11-21 DIAGNOSIS — D509 Iron deficiency anemia, unspecified: Secondary | ICD-10-CM | POA: Diagnosis not present

## 2023-11-21 DIAGNOSIS — E039 Hypothyroidism, unspecified: Secondary | ICD-10-CM | POA: Diagnosis not present

## 2023-11-21 DIAGNOSIS — R110 Nausea: Secondary | ICD-10-CM

## 2023-11-21 DIAGNOSIS — R112 Nausea with vomiting, unspecified: Secondary | ICD-10-CM | POA: Diagnosis not present

## 2023-11-21 DIAGNOSIS — K3189 Other diseases of stomach and duodenum: Secondary | ICD-10-CM | POA: Diagnosis not present

## 2023-11-21 DIAGNOSIS — I10 Essential (primary) hypertension: Secondary | ICD-10-CM | POA: Diagnosis not present

## 2023-11-21 MED ORDER — SODIUM CHLORIDE 0.9 % IV SOLN: 500.0000 mL | Freq: Once | INTRAVENOUS | Status: DC

## 2023-11-21 NOTE — Op Note (Signed)
 [facility] Patient Name: [name] Procedure Date: 11/21/2023 10:36 AM MRN: [id number] Endoscopist: Norleen SAILOR. Abran , MD, [id number] Age: 73 Referring MD:  Date of Birth: [birth year] Gender: Female Account #: [id number] Procedure:                Upper GI endoscopy with biopsies Indications:              Iron deficiency anemia, Nausea associated with                            dizziness Medicines:                Monitored Anesthesia Care Procedure:                Pre-Anesthesia Assessment:                           - Prior to the procedure, a History and Physical                            was performed, and patient medications and                            allergies were reviewed. The patient's tolerance of                            previous anesthesia was also reviewed. The risks                            and benefits of the procedure and the sedation                            options and risks were discussed with the patient.                            All questions were answered, and informed consent                            was obtained. Prior Anticoagulants: The patient has                            taken no anticoagulant or antiplatelet agents. ASA                            Grade Assessment: II - A patient with mild systemic                            disease. After reviewing the risks and benefits,                            the patient was deemed in satisfactory condition to                            undergo the procedure.  After obtaining informed consent, the endoscope was                            passed under direct vision. Throughout the                            procedure, the patient's blood pressure, pulse, and                            oxygen saturations were monitored continuously. The                            GIF [id number] #[id number] was introduced through the                            mouth, and advanced to the  second part of duodenum.                            The upper GI endoscopy was accomplished without                            difficulty. The patient tolerated the procedure                            well. Scope In: Scope Out: Findings:                 The esophagus was normal.                           The stomach revealed somewhat atrophic mucosa but                            was otherwise normal.                           The examined duodenum was normal. Biopsies were                            taken with a cold forceps for histology to rule out                            celiac disease.                           The cardia and gastric fundus were normal on                            retroflexion. Complications:            No immediate complications. Estimated Blood Loss:     Estimated blood loss: none. Impression:               - Normal esophagus.                           - Normal stomach save somewhat atrophic gastric  mucosa.                           - Normal examined duodenum. Biopsied. Recommendation:           - Patient has a contact number available for                            emergencies. The signs and symptoms of potential                            delayed complications were discussed with the                            patient. Return to normal activities tomorrow.                            Written discharge instructions were provided to the                            patient.                           - Resume previous diet.                           - Continue present medications.                           - Await pathology results.                           - Pantoprazole  can be used as needed for problems                            with heartburn or indigestion                           - Return to the care of your primary provider Norleen SAILOR. Abran, MD 11/21/2023 11:16:14 AM This report has been signed electronically.

## 2023-11-21 NOTE — Progress Notes (Signed)
 Report to PACU, RN, vss, BBS= Clear.

## 2023-11-21 NOTE — Progress Notes (Signed)
 Pt's states no medical or surgical changes since previsit or office visit.

## 2023-11-21 NOTE — Progress Notes (Signed)
 Called to room to assist during endoscopic procedure.  Patient ID and intended procedure confirmed with present staff. Received instructions for my participation in the procedure from the performing physician.

## 2023-11-21 NOTE — Progress Notes (Signed)
 Expand All Collapse All    Chief Complaint: IDA and nausea Primary GI MD: Dr. Abran   HPI:  [name] is a 73 year old female with iron deficiency anemia who presents with nausea and dizziness. She was referred by her primary care physician for evaluation of nausea and dizziness.   Recent labs with mild anemia, hemoglobin 11.4, MCV 89.1, isolated elevated BUN of 24.  Iron 33, ferritin 18.2, saturation 8.4%   Discussed the use of AI scribe software for clinical note transcription with the patient, who gave verbal consent to proceed.   History of Present Illness She experiences nausea and dizziness, describing the dizziness as a feeling of being unbalanced rather than vertigo.    The nausea is often worse after eating. She is not currently taking omeprazole  or Carafate  but occasionally uses Pepto-Bismol, which provides some relief.   She has a history of iron deficiency anemia, She has been iron deficient for quite some time and is currently taking iron supplements with added vitamin C. No visible bleeding or black stools. Her daughter and a friend have suggested that she may not be consuming enough protein, which she acknowledges might be true as she lives alone following the death of her husband 18 months ago.   She is not currently taking any NSAIDs regularly but does use Tylenol , usually in arthritis strength, on occasion. She also mentions taking four iron pills at one time, which might contribute to her nausea.     PREVIOUS GI WORKUP    Colonoscopy 09/20/2022 - One 10 mm polyp in the sigmoid colon, removed with a hot snare. Resected and retrieved. - Diverticulosis in the sigmoid colon and in the right colon. Sigmoid stenosis  - The examination was otherwise normal on direct and retroflexion views. - Biopsy does inflammatory polyp with no recommendation for repeat colonoscopy       Past Medical History:  Diagnosis Date   Allergy     Anemia     Anxiety     ANXIETY  DEPRESSION 09/15/2007   Arthritis     ASTHMA 05/15/2009   Asthma     DIABETES MELLITUS, TYPE II 12/11/2006   DIVERTICULOSIS, COLON 12/11/2006   Edema 01/15/2009   Gout      pt denies; toe was broken   Headache(784.0) 12/11/2006   Hyperlipidemia     Hypertension     HYPERTENSION NEC 08/14/2007   HYPOTHYROIDISM 12/11/2006   PELVIC PAIN, CHRONIC 08/14/2007   Restless leg syndrome                 Past Surgical History:  Procedure Laterality Date   ABDOMINAL HYSTERECTOMY       APPENDECTOMY       Bladder Suspension       COLONOSCOPY   10/08/2017    Dr. Abran   LEFT HEART CATH AND CORONARY ANGIOGRAPHY N/A 09/05/2021    Procedure: LEFT HEART CATH AND CORONARY ANGIOGRAPHY;  Surgeon: Burnard Debby LABOR, MD;  Location: [facility];  Service: Cardiovascular;  Laterality: N/A;                Current Outpatient Medications  Medication Sig Dispense Refill   acetaminophen  (TYLENOL ) 500 MG tablet Take 1,000 mg by mouth every 8 (eight) hours as needed for moderate pain (pain score 4-6).       albuterol  (VENTOLIN  HFA) 108 (90 Base) MCG/ACT inhaler Inhale 2 puffs into the lungs every 6 (six) hours as needed for wheezing or shortness  of breath. 8 g 0   ALPRAZolam  (XANAX ) 0.25 MG tablet Take 1 tablet (0.25 mg total) by mouth 2 (two) times daily as needed for anxiety. 60 tablet 0   amLODipine  (NORVASC ) 5 MG tablet TAKE 1 TABLET(5 MG) BY MOUTH DAILY 30 tablet 0   ascorbic acid (VITAMIN C) 500 MG tablet Take 500 mg by mouth daily.       ascorbic acid (VITAMIN C) 500 MG tablet Take 500 mg by mouth daily.       aspirin  81 MG tablet Take 81 mg by mouth at bedtime.       atorvastatin  (LIPITOR) 10 MG tablet TAKE 1 TABLET(10 MG) BY MOUTH DAILY 90 tablet 3   betamethasone  valerate ointment (VALISONE ) 0.1 % Apply 1 Application topically 2 (two) times daily. Place in the affected area twice a bid for 2 weeks and then place on the area at night twice weekly. 45 g 0   blood glucose meter kit and  supplies KIT Dispense based on patient and insurance preference. Use up to four times daily as directed. 1 each 0   Carboxymethylcellul-Glycerin (LUBRICATING EYE DROPS OP) Place 1 drop into both eyes daily as needed (dry eyes).       cholecalciferol (VITAMIN D3) 25 MCG (1000 UT) tablet Take 1,000 Units by mouth daily.       citalopram  (CELEXA ) 20 MG tablet TAKE 1 TABLET(20 MG) BY MOUTH DAILY 90 tablet 1   diclofenac  Sodium (VOLTAREN ) 1 % GEL Apply 2 g topically 4 (four) times daily. 150 g 0   Emollient (GOLD BOND DIABETICS DRY SKIN) CREA Apply 1 application. topically at bedtime.       furosemide  (LASIX ) 20 MG tablet Take one tab daily x 3 days as needed for swelling in legs 90 tablet 0   glucose blood test strip Use to check blood glucose TID 200 each 0   HYDROcodone  bit-homatropine (HYCODAN) 5-1.5 MG/5ML syrup Take 5 mLs by mouth every 8 (eight) hours as needed for cough. 120 mL 0   Iron Combinations (CHROMAGEN) capsule Take 1 capsule by mouth daily.       Lancets 28G MISC Use to check blood glucose 4 times daily 100 each 0   levothyroxine  (SYNTHROID ) 88 MCG tablet TAKE 1 TABLET(88 MCG) BY MOUTH DAILY 90 tablet 3   Lidocaine  4 % AERO Apply 1 spray topically at bedtime as needed (foot pain).       losartan -hydrochlorothiazide  (HYZAAR) 100-25 MG tablet TAKE 1 TABLET BY MOUTH DAILY 90 tablet 3   metFORMIN  (GLUCOPHAGE ) 1000 MG tablet Take 1 tablet (1,000 mg total) by mouth 2 (two) times daily with a meal. 180 tablet 3   ondansetron  (ZOFRAN ) 4 MG tablet Take 1 tablet (4 mg total) by mouth every 8 (eight) hours as needed for nausea or vomiting. 20 tablet 0   pantoprazole  (PROTONIX ) 40 MG tablet Take 1 tablet (40 mg total) by mouth daily. 30 tablet 3   pramipexole  (MIRAPEX ) 1.5 MG tablet TAKE 1 TABLET(1.5 MG) BY MOUTH AT BEDTIME 90 tablet 1   traMADol (ULTRAM) 50 MG tablet Take by mouth.       vitamin B-12 (CYANOCOBALAMIN ) 1000 MCG tablet Take 1,000 mcg by mouth daily.       benzonatate  (TESSALON ) 200  MG capsule Take 1 capsule (200 mg total) by mouth 3 (three) times daily as needed. (Patient not taking: Reported on 10/10/2023) 30 capsule 0   meloxicam  (MOBIC ) 15 MG tablet TAKE 1 TABLET(15 MG) BY MOUTH DAILY (Patient  not taking: Reported on 10/10/2023) 30 tablet 0   omeprazole  (PRILOSEC) 20 MG capsule Take 1 capsule (20 mg total) by mouth daily. (Patient not taking: Reported on 10/10/2023) 30 capsule 0   sucralfate  (CARAFATE ) 1 g tablet Take 1 tablet (1 g total) by mouth 4 (four) times daily -  with meals and at bedtime for 14 days. (Patient not taking: Reported on 10/10/2023) 56 tablet 0      No current facility-administered medications for this visit.             Allergies as of 10/10/2023 - Review Complete 10/10/2023  Allergen Reaction Noted   Benadryl [diphenhydramine hcl] Anaphylaxis 11/19/2014   Ace inhibitors Cough 09/22/2015   Amoxicillin       Aspirin  Nausea And Vomiting     Codeine Nausea And Vomiting     Fish allergy Nausea And Vomiting 07/24/2016   Hydrocodone  Nausea And Vomiting     Indomethacin       Pentazocine lactate       Promethazine hcl       Quinine       Chlorphen-phenyleph-methscop Rash             Family History  Problem Relation Age of Onset   COPD Mother          smoker   Diabetes Mother     COPD Father          smoker   Diabetes Father     Diabetes Sister     Diabetes Mellitus II Sister     Diabetes Sister     Diabetes Sister     Diabetes Sister     Lung cancer Nephew 60   Breast cancer Other 15   Colon cancer Neg Hx     Esophageal cancer Neg Hx     Pancreatic cancer Neg Hx     Rectal cancer Neg Hx     Stomach cancer Neg Hx            Social History         Socioeconomic History   Marital status: Widowed      Spouse name: Not on file   Number of children: Not on file   Years of education: Not on file   Highest education level: Never attended school  Occupational History   Not on file  Tobacco Use   Smoking status: Never    Smokeless tobacco: Never  Vaping Use   Vaping status: Never Used  Substance and Sexual Activity   Alcohol use: Yes      Comment: wine occ   Drug use: No   Sexual activity: Not Currently      Comment: hysterectomy, <5 sexual partners, <16 y/o, no STD  Other Topics Concern   Not on file  Social History Narrative   Not on file    Social Drivers of Health        Financial Resource Strain: Low Risk  (08/07/2023)    Received from [facility]    Overall Financial Resource Strain (CARDIA)     Difficulty of Paying Living Expenses: Not very hard  Food Insecurity: Food Insecurity Present (08/07/2023)    Received from [facility]    Hunger Vital Sign     Within the past 12 months, you worried that your food would run out before you got the money to buy more.: Sometimes true     Within the past 12 months, the food you bought just didn't last  and you didn't have money to get more.: Never true  Transportation Needs: No Transportation Needs (08/07/2023)    Received from [facility]    PRAPARE - Transportation     Lack of Transportation (Medical): No     Lack of Transportation (Non-Medical): No  Physical Activity: Insufficiently Active (08/07/2023)    Received from [facility]    Exercise Vital Sign     On average, how many days per week do you engage in moderate to strenuous exercise (like a brisk walk)?: 3 days     On average, how many minutes do you engage in exercise at this level?: 10 min  Stress: No Stress Concern Present (08/07/2023)    Received from [facility] - Occupational Stress Questionnaire     Feeling of Stress : Only a little  Social Connections: Socially Integrated (08/07/2023)    Received from [facility]    Social Network     How would you rate your social network (family, work, friends)?: Good participation with social networks  Intimate Partner Violence: Not At Risk (08/07/2023)    Received from [facility]    HITS      Over the last 12 months how often did your partner physically hurt you?: Never     Over the last 12 months how often did your partner insult you or talk down to you?: Never     Over the last 12 months how often did your partner threaten you with physical harm?: Never     Over the last 12 months how often did your partner scream or curse at you?: Never      Review of Systems:    Constitutional: No weight loss, fever, chills, weakness or fatigue HEENT: Eyes: No change in vision               Ears, Nose, Throat:  No change in hearing or congestion Skin: No rash or itching Cardiovascular: No chest pain, chest pressure or palpitations   Respiratory: No SOB or cough Gastrointestinal: See HPI and otherwise negative Genitourinary: No dysuria or change in urinary frequency Neurological: No headache, dizziness or syncope Musculoskeletal: No new muscle or joint pain Hematologic: No bleeding or bruising Psychiatric: No history of depression or anxiety      Physical Exam:  Vital signs: BP 130/70   Pulse 84   Ht 5' 3.5 (1.613 m)   Wt 174 lb (78.9 kg)   BMI 30.34 kg/m    Constitutional: NAD, alert and cooperative Head:  Normocephalic and atraumatic. Eyes:   PEERL, EOMI. No icterus. Conjunctiva pink. Respiratory: Respirations even and unlabored. Lungs clear to auscultation bilaterally.   No wheezes, crackles, or rhonchi.  Cardiovascular:  Regular rate and rhythm. No peripheral edema, cyanosis or pallor.  Gastrointestinal:  Soft, nondistended, nontender. No rebound or guarding. Normal bowel sounds. No appreciable masses or hepatomegaly. Rectal:  Declines Msk:  Symmetrical without gross deformities. Without edema, no deformity or joint abnormality.  Neurologic:  Alert and  oriented x4;  grossly normal neurologically.  Skin:   Dry and intact without significant lesions or rashes. Psychiatric: Oriented to person, place and time. Demonstrates good judgement and reason without abnormal affect  or behaviors.     RELEVANT LABS AND IMAGING: CBC Labs (Brief)          Component Value Date/Time    WBC 7.3 09/13/2023 1030    RBC 3.97 09/13/2023 1030    HGB 11.4 (L)  09/13/2023 1030    HCT 35.4 (L) 09/13/2023 1030    PLT 331.0 09/13/2023 1030    MCV 89.1 09/13/2023 1030    MCH 29.3 11/03/2019 0810    MCHC 32.2 09/13/2023 1030    RDW 15.0 09/13/2023 1030    LYMPHSABS 1.6 06/13/2023 0948    MONOABS 0.4 06/13/2023 0948    EOSABS 0.2 06/13/2023 0948    BASOSABS 0.1 06/13/2023 0948        CMP     Labs (Brief)          Component Value Date/Time    NA 138 09/13/2023 1030    K 4.3 09/13/2023 1030    CL 99 09/13/2023 1030    CO2 29 09/13/2023 1030    GLUCOSE 106 (H) 09/13/2023 1030    BUN 24 (H) 09/13/2023 1030    CREATININE 1.01 09/13/2023 1030    CREATININE 0.86 11/03/2019 0810    CALCIUM  9.7 09/13/2023 1030    PROT 6.9 09/13/2023 1030    ALBUMIN 4.1 09/13/2023 1030    AST 16 09/13/2023 1030    ALT 15 09/13/2023 1030    ALKPHOS 74 09/13/2023 1030    BILITOT 0.5 09/13/2023 1030    GFRNONAA 80.83 09/28/2009 1030    GFRAA 84 06/23/2007 1056          Assessment/Plan:    Iron deficiency anemia hemoglobin 11.4, MCV 89.1, isolated elevated BUN of 24.  Iron 33, ferritin 18.2, saturation 8.4%.  No anemia 1 year ago, but iron deficiency present. Could be related to blood donation versus decreased protein intake after loss of her husband but she would like to be sure and find the root cause. Recent colonoscopy 2024 with inflammatory polyp.  No previous EGD. - EGD for further evaluation of iron deficiency anemia (no need for colonoscopy as she just had one within the last year) -- I thoroughly discussed the procedure with the patient (at bedside) to include nature of the procedure, alternatives, benefits, and risks (including but not limited to bleeding, infection, perforation, anesthesia/cardiac pulmonary complications).  Patient verbalized understanding and gave verbal  consent to proceed with procedure.    Nausea Nausea with eating. No GERD. No dysphagia. Possibly some gastritis or even iron pill.  Discussed option of conservative management versus EGD for further evaluation and patient prefers to pursue EGD - Trial of pantoprazole  40 Mg once daily - Avoid NSAIDs - Educated patient on lifestyle modifications provided patient education handout       Nestor Mollie RIGGERS [facility] 10/10/2023, 9:53 AM      Recent complete H&P as above.  No interval clinical change.  Now for upper endoscopy

## 2023-11-21 NOTE — Patient Instructions (Signed)
 Thank you for letting us  take care of your healthcare needs today. Continue to use Pantoprazole  for heartburn or indigestion.     YOU HAD AN ENDOSCOPIC PROCEDURE TODAY AT THE [facility]:   Refer to the procedure report that was given to you for any specific questions about what was found during the examination.  If the procedure report does not answer your questions, please call your gastroenterologist to clarify.  If you requested that your care partner not be given the details of your procedure findings, then the procedure report has been included in a sealed envelope for you to review at your convenience later.  YOU SHOULD EXPECT: Some feelings of bloating in the abdomen. Passage of more gas than usual.  Walking can help get rid of the air that was put into your GI tract during the procedure and reduce the bloating. If you had a lower endoscopy (such as a colonoscopy or flexible sigmoidoscopy) you may notice spotting of blood in your stool or on the toilet paper. If you underwent a bowel prep for your procedure, you may not have a normal bowel movement for a few days.  Please Note:  You might notice some irritation and congestion in your nose or some drainage.  This is from the oxygen used during your procedure.  There is no need for concern and it should clear up in a day or so.  SYMPTOMS TO REPORT IMMEDIATELY:  Following upper endoscopy (EGD)  Vomiting of blood or coffee ground material  New chest pain or pain under the shoulder blades  Painful or persistently difficult swallowing  New shortness of breath  Fever of 100F or higher  Black, tarry-looking stools  For urgent or emergent issues, a gastroenterologist can be reached at any hour by calling [phone]. Do not use MyChart messaging for urgent concerns.    DIET:  We do recommend a small meal at first, but then you may proceed to your regular diet.  Drink plenty of fluids but you should avoid alcoholic beverages  for 24 hours.  ACTIVITY:  You should plan to take it easy for the rest of today and you should NOT DRIVE or use heavy machinery until tomorrow (because of the sedation medicines used during the test).    FOLLOW UP: Our staff will call the number listed on your records the next business day following your procedure.  We will call around 7:15- 8:00 am to check on you and address any questions or concerns that you may have regarding the information given to you following your procedure. If we do not reach you, we will leave a message.     If any biopsies were taken you will be contacted by phone or by letter within the next 1-3 weeks.  Please call us  at [phone] if you have not heard about the biopsies in 3 weeks.    SIGNATURES/CONFIDENTIALITY: You and/or your care partner have signed paperwork which will be entered into your electronic medical record.  These signatures attest to the fact that that the information above on your After Visit Summary has been reviewed and is understood.  Full responsibility of the confidentiality of this discharge information lies with you and/or your care-partner.

## 2023-11-22 ENCOUNTER — Ambulatory Visit: Admitting: Podiatry

## 2023-11-22 ENCOUNTER — Telehealth: Payer: Self-pay | Admitting: *Deleted

## 2023-11-22 NOTE — Telephone Encounter (Signed)
  Follow up Call-     11/21/2023    9:48 AM 09/20/2022    1:27 PM  Call back number  Post procedure Call Back phone  # [phone]  Permission to leave phone message Yes Yes     Patient questions:  Do you have a fever, pain , or abdominal swelling? No. Pain Score  0 *  Have you tolerated food without any problems? Yes.    Have you been able to return to your normal activities? Yes.    Do you have any questions about your discharge instructions: Diet   No. Medications  No. Follow up visit  No.  Do you have questions or concerns about your Care? No.  Actions: * If pain score is 4 or above: No action needed, pain <4.

## 2023-11-24 ENCOUNTER — Other Ambulatory Visit: Payer: Self-pay | Admitting: Medical Genetics

## 2023-11-25 ENCOUNTER — Other Ambulatory Visit: Payer: Self-pay | Admitting: *Deleted

## 2023-11-25 ENCOUNTER — Ambulatory Visit: Payer: Self-pay | Admitting: Internal Medicine

## 2023-11-25 DIAGNOSIS — I10 Essential (primary) hypertension: Secondary | ICD-10-CM

## 2023-11-25 LAB — SURGICAL PATHOLOGY

## 2023-11-25 MED ORDER — AMLODIPINE BESYLATE 5 MG PO TABS: 5.0000 mg | ORAL_TABLET | Freq: Every day | ORAL | 1 refills | Status: AC

## 2023-12-05 DIAGNOSIS — M7051 Other bursitis of knee, right knee: Secondary | ICD-10-CM | POA: Diagnosis not present

## 2023-12-05 DIAGNOSIS — M1711 Unilateral primary osteoarthritis, right knee: Secondary | ICD-10-CM | POA: Diagnosis not present

## 2023-12-05 DIAGNOSIS — G8929 Other chronic pain: Secondary | ICD-10-CM | POA: Diagnosis not present

## 2023-12-05 DIAGNOSIS — M25561 Pain in right knee: Secondary | ICD-10-CM | POA: Diagnosis not present

## 2023-12-11 ENCOUNTER — Encounter: Payer: Self-pay | Admitting: Adult Health

## 2023-12-16 ENCOUNTER — Encounter: Payer: Self-pay | Admitting: Adult Health

## 2023-12-16 DIAGNOSIS — F32A Depression, unspecified: Secondary | ICD-10-CM

## 2023-12-17 MED ORDER — CITALOPRAM HYDROBROMIDE 20 MG PO TABS: ORAL_TABLET | ORAL | 1 refills | Status: AC

## 2023-12-19 ENCOUNTER — Encounter: Payer: Self-pay | Admitting: Podiatry

## 2023-12-19 ENCOUNTER — Ambulatory Visit: Admitting: Podiatry

## 2023-12-19 DIAGNOSIS — B351 Tinea unguium: Secondary | ICD-10-CM | POA: Diagnosis not present

## 2023-12-19 DIAGNOSIS — M79674 Pain in right toe(s): Secondary | ICD-10-CM

## 2023-12-19 DIAGNOSIS — M79675 Pain in left toe(s): Secondary | ICD-10-CM

## 2023-12-19 DIAGNOSIS — E1142 Type 2 diabetes mellitus with diabetic polyneuropathy: Secondary | ICD-10-CM

## 2023-12-19 NOTE — Progress Notes (Signed)
  Subjective:  Patient ID: [name], female    DOB: 1950/10/08,   MRN: [id number]  Chief Complaint  Patient presents with   Diabetes    Toenails  Saw Darleene Shape, NP - 11/14/2023; A1c -     73 y.o. female presents for concern of thickened elongated and painful nails that are difficult to trim. Requesting to have them trimmed today. Relates burning and tingling in their feet. Patient is diabetic and last A1c was  Lab Results  Component Value Date   HGBA1C 7.4 (H) 06/13/2023   .   PCP:  Shape Darleene, NP    . Denies any other pedal complaints. Denies n/v/f/c.   Past Medical History:  Diagnosis Date   Allergy    Anemia    Anxiety    ANXIETY DEPRESSION 09/15/2007   Arthritis    ASTHMA 05/15/2009   Asthma    DIABETES MELLITUS, TYPE II 12/11/2006   DIVERTICULOSIS, COLON 12/11/2006   Edema 01/15/2009   Gout    pt denies; toe was broken   Headache(784.0) 12/11/2006   Hyperlipidemia    Hypertension    HYPERTENSION NEC 08/14/2007   HYPOTHYROIDISM 12/11/2006   PELVIC PAIN, CHRONIC 08/14/2007   Restless leg syndrome     Objective:  Physical Exam: Vascular: DP/PT pulses 2/4 bilateral. CFT <3 seconds. Absent hair growth on digits. Edema noted to bilateral lower extremities. Xerosis noted bilaterally.  Skin. No lacerations or abrasions bilateral feet. Nails 1-5 bilateral  are thickened discolored and elongated with subungual debris.  Musculoskeletal: MMT 5/5 bilateral lower extremities in DF, PF, Inversion and Eversion. Deceased ROM in DF of ankle joint.  Neurological: Sensation intact to light touch. Protective sensation diminished bilateral.    Assessment:   1. Pain due to onychomycosis of toenails of both feet   2. Type 2 diabetes mellitus with peripheral neuropathy (HCC)       Plan:  Patient was evaluated and treated and all questions answered. -Discussed and educated patient on diabetic foot care, especially with  regards to the vascular,  neurological and musculoskeletal systems.  -Stressed the importance of good glycemic control and the detriment of not  controlling glucose levels in relation to the foot. -Discussed supportive shoes at all times and checking feet regularly.  -Mechanically debrided all nails 1-5 bilateral using sterile nail nipper and filed with dremel without incident  -Answered all patient questions -Patient to return  in 3 months for at risk foot care -Patient advised to call the office if any problems or questions arise in the meantime.   Asberry Failing, DPM

## 2024-01-02 ENCOUNTER — Encounter: Payer: Self-pay | Admitting: Adult Health

## 2024-01-02 NOTE — Telephone Encounter (Signed)
**Note De-identified  [place] Obfuscation** Please advise 

## 2024-01-03 ENCOUNTER — Ambulatory Visit: Admitting: Dietician

## 2024-01-03 ENCOUNTER — Other Ambulatory Visit: Payer: Self-pay | Admitting: Adult Health

## 2024-01-03 DIAGNOSIS — G2581 Restless legs syndrome: Secondary | ICD-10-CM

## 2024-01-06 ENCOUNTER — Other Ambulatory Visit (HOSPITAL_COMMUNITY)
Admission: RE | Admit: 2024-01-06 | Discharge: 2024-01-06 | Disposition: A | Discharge status: Home / Self Care | Payer: Self-pay | Source: Ambulatory Visit | Attending: Medical Genetics | Admitting: Medical Genetics

## 2024-01-14 LAB — GENECONNECT MOLECULAR SCREEN: Genetic Analysis Overall Interpretation: NEGATIVE

## 2024-01-15 ENCOUNTER — Ambulatory Visit (INDEPENDENT_AMBULATORY_CARE_PROVIDER_SITE_OTHER): Payer: Medicare Other

## 2024-01-15 VITALS — BP 120/60 | HR 69 | Temp 98.2°F | Ht 63.5 in | Wt 172.8 lb

## 2024-01-15 DIAGNOSIS — Z0000 Encounter for general adult medical examination without abnormal findings: Secondary | ICD-10-CM

## 2024-01-15 NOTE — Patient Instructions (Addendum)
 [name],  Thank you for taking the time for your Medicare Wellness Visit. I appreciate your continued commitment to your health goals. Please review the care plan we discussed, and feel free to reach out if I can assist you further.  Medicare recommends these wellness visits once per year to help you and your care team stay ahead of potential health issues. These visits are designed to focus on prevention, allowing your provider to concentrate on managing your acute and chronic conditions during your regular appointments.  Please note that Annual Wellness Visits do not include a physical exam. Some assessments may be limited, especially if the visit was conducted virtually. If needed, we may recommend a separate in-person follow-up with your provider.  Ongoing Care Seeing your primary care provider every 3 to 6 months helps us  monitor your health and provide consistent, personalized care.   Referrals If a referral was made during today's visit and you haven't received any updates within two weeks, please contact the referred provider directly to check on the status.  Recommended Screenings:  Health Maintenance  Topic Date Due   Zoster (Shingles) Vaccine (1 of 2) 06/21/2002   Eye exam for diabetics  05/16/2023   Flu Shot  11/15/2023   Hemoglobin A1C  12/11/2023   COVID-19 Vaccine (9 - 2025-26 season) 12/16/2023   Complete foot exam   05/22/2024   Yearly kidney health urinalysis for diabetes  06/12/2024   Breast Cancer Screening  06/12/2024   Yearly kidney function blood test for diabetes  09/12/2024   Medicare Annual Wellness Visit  01/14/2025   Colon Cancer Screening  09/20/2027   DTaP/Tdap/Td vaccine (5 - Td or Tdap) 12/17/2032   Pneumococcal Vaccine for age over 26  Completed   DEXA scan (bone density measurement)  Completed   Hepatitis C Screening  Completed   HPV Vaccine  Aged Out   Meningitis B Vaccine  Aged Out   Opioid Pain Medicine Management Opioids are powerful  medicines that are used to treat moderate to severe pain. When used for short periods of time, they can help you to: Sleep better. Do better in physical or occupational therapy. Feel better in the first few days after an injury. Recover from surgery. Opioids should be taken with the supervision of a trained health care provider. They should be taken for the shortest period of time possible. This is because opioids can be addictive, and the longer you take opioids, the greater your risk of addiction. This addiction can also be called opioid use disorder. What are the risks? Using opioid pain medicines for longer than 3 days increases your risk of side effects. Side effects include: Constipation. Nausea and vomiting. Breathing difficulties (respiratory depression). Drowsiness. Confusion. Opioid use disorder. Itching. Taking opioid pain medicine for a long period of time can affect your ability to do daily tasks. It also puts you at risk for: Motor vehicle crashes. Depression. Suicide. Heart attack. Overdose, which can be life-threatening. What is a pain treatment plan? A pain treatment plan is an agreement between you and your health care provider. Pain is unique to each person, and treatments vary depending on your condition. To manage your pain, you and your health care provider need to work together. To help you do this: Discuss the goals of your treatment, including how much pain you might expect to have and how you will manage the pain. Review the risks and benefits of taking opioid medicines. Remember that a good treatment plan uses more than  one approach and minimizes the chance of side effects. Be honest about the amount of medicines you take and about any drug or alcohol use. Get pain medicine prescriptions from only one health care provider. Pain can be managed with many types of alternative treatments. Ask your health care provider to refer you to one or more specialists who can  help you manage pain through: Physical or occupational therapy. Counseling (cognitive behavioral therapy). Good nutrition. Biofeedback. Massage. Meditation. Non-opioid medicine. Following a gentle exercise program. How to use opioid pain medicine Taking medicine Take your pain medicine exactly as told by your health care provider. Take it only when you need it. If your pain gets less severe, you may take less than your prescribed dose if your health care provider approves. If you are not having pain, do nottake pain medicine unless your health care provider tells you to take it. If your pain is severe, do nottry to treat it yourself by taking more pills than instructed on your prescription. Contact your health care provider for help. Write down the times when you take your pain medicine. It is easy to become confused while on pain medicine. Writing the time can help you avoid overdose. Take other over-the-counter or prescription medicines only as told by your health care provider. Keeping yourself and others safe  While you are taking opioid pain medicine: Do not drive, use machinery, or power tools. Do not sign legal documents. Do not drink alcohol. Do not take sleeping pills. Do not supervise children by yourself. Do not do activities that require climbing or being in high places. Do not go to a lake, river, ocean, spa, or swimming pool. Do not share your pain medicine with anyone. Keep pain medicine in a locked cabinet or in a secure area where pets and children cannot reach it. Stopping your use of opioids If you have been taking opioid medicine for more than a few weeks, you may need to slowly decrease (taper) how much you take until you stop completely. Tapering your use of opioids can decrease your risk of symptoms of withdrawal, such as: Pain and cramping in the abdomen. Nausea. Sweating. Sleepiness. Restlessness. Uncontrollable shaking (tremors). Cravings for the  medicine. Do not attempt to taper your use of opioids on your own. Talk with your health care provider about how to do this. Your health care provider may prescribe a step-down schedule based on how much medicine you are taking and how long you have been taking it. Getting rid of leftover pills Do not save any leftover pills. Get rid of leftover pills safely by: Taking the medicine to a prescription take-back program. This is usually offered by the county or law enforcement. Bringing them to a pharmacy that has a drug disposal container. Flushing them down the toilet. Check the label or package insert of your medicine to see whether this is safe to do. Throwing them out in the trash. Check the label or package insert of your medicine to see whether this is safe to do. If it is safe to throw it out, remove the medicine from the original container, put it into a sealable bag or container, and mix it with used coffee grounds, food scraps, dirt, or cat litter before putting it in the trash. Follow these instructions at home: Activity Do exercises as told by your health care provider. Avoid activities that make your pain worse. Return to your normal activities as told by your health care provider. Ask your health care  provider what activities are safe for you. General instructions You may need to take these actions to prevent or treat constipation: Drink enough fluid to keep your urine pale yellow. Take over-the-counter or prescription medicines. Eat foods that are high in fiber, such as beans, whole grains, and fresh fruits and vegetables. Limit foods that are high in fat and processed sugars, such as fried or sweet foods. Keep all follow-up visits. This is important. Where to find support If you have been taking opioids for a long time, you may benefit from receiving support for quitting from a local support group or counselor. Ask your health care provider for a referral to these resources in  your area. Where to find more information Centers for Disease Control and Prevention (CDC): FootballExhibition.com.br [place] Food and Drug Administration (FDA): PumpkinSearch.com.ee Get help right away if: You may have taken too much of an opioid (overdosed). Common symptoms of an overdose: Your breathing is slower or more shallow than normal. You have a very slow heartbeat (pulse). You have slurred speech. You have nausea and vomiting. Your pupils become very small. You have other potential symptoms: You are very confused. You faint or feel like you will faint. You have cold, clammy skin. You have blue lips or fingernails. You have thoughts of harming yourself or harming others. These symptoms may represent a serious problem that is an emergency. Do not wait to see if the symptoms will go away. Get medical help right away. Call your local emergency services (911 in the [place] Do not drive yourself to the hospital.  If you ever feel like you may hurt yourself or others, or have thoughts about taking your own life, get help right away. Go to your nearest emergency department or: Call your local emergency services (911 in the [place] Call the [facility] (7823424322 in the [place] Call a suicide crisis helpline, such as the National Suicide Prevention Lifeline at (206)196-8139 or 988 in the [place] This is open 24 hours a day in the [place] If you're a Veteran: Call 988 and press 1. This is open 24 hours a day. Text the [employer] at 670 127 7749. Summary Opioid medicines can help you manage moderate to severe pain for a short period of time. A pain treatment plan is an agreement between you and your health care provider. Discuss the goals of your treatment, including how much pain you might expect to have and how you will manage the pain. If you think that you or someone else may have taken too much of an opioid, get medical help right away. This information is not intended to replace advice  given to you by your health care provider. Make sure you discuss any questions you have with your health care provider. Document Revised: 01/07/2023 Document Reviewed: 07/13/2020 Elsevier Patient Education  2024 [employer]    01/15/2024   11:41 AM  Advanced Directives  Does Patient Have a Medical Advance Directive? Yes  Type of Estate agent of [place];Living will  Does patient want to make changes to medical advance directive? No - Patient declined  Copy of Healthcare Power of Attorney in Chart? Yes - validated most recent copy scanned in chart (See row information)   Advance Care Planning is important because it: Ensures you receive medical care that aligns with your values, goals, and preferences. Provides guidance to your family and loved ones, reducing the emotional burden of decision-making during critical moments.  Vision: Annual vision screenings are recommended  for early detection of glaucoma, cataracts, and diabetic retinopathy. These exams can also reveal signs of chronic conditions such as diabetes and high blood pressure.  Dental: Annual dental screenings help detect early signs of oral cancer, gum disease, and other conditions linked to overall health, including heart disease and diabetes.  Please see the attached documents for additional preventive care recommendations.

## 2024-01-15 NOTE — Progress Notes (Signed)
 Subjective:   [name] is a 73 y.o. who presents for a Medicare Wellness preventive visit.  As a reminder, Annual Wellness Visits don't include a physical exam, and some assessments may be limited, especially if this visit is performed virtually. We may recommend an in-person follow-up visit with your provider if needed.  Visit Complete: In person    Persons Participating in Visit: Patient.  AWV Questionnaire:  Yes 01/14/24  Cardiac Risk Factors include: advanced age (>42men, >4 women);diabetes mellitus;hypertension     Objective:    Today's Vitals   01/15/24 1113  BP: 120/60  Pulse: 69  Temp: 98.2 F (36.8 C)  TempSrc: Oral  SpO2: 93%  Weight: 172 lb 12.8 oz (78.4 kg)  Height: 5' 3.5 (1.613 m)   Body mass index is 30.13 kg/m.     01/15/2024   11:41 AM 01/11/2023   11:28 AM 10/25/2022   12:49 PM 01/04/2022    3:53 PM 09/05/2021    6:18 AM 01/02/2021    8:39 AM 10/31/2020   11:44 AM  Advanced Directives  Does Patient Have a Medical Advance Directive? Yes Yes Yes Yes Yes Yes Yes  Type of Estate agent of [place];Living will Healthcare Power of [place];Living will  Healthcare Power of [place];Living will Healthcare Power of [place];Living will Healthcare Power of [place];Living will Healthcare Power of [place];Living will  Does patient want to make changes to medical advance directive? No - Patient declined No - Patient declined No - Patient declined No - Patient declined No - Patient declined  No - Patient declined  Copy of Healthcare Power of Attorney in Chart? Yes - validated most recent copy scanned in chart (See row information) Yes - validated most recent copy scanned in chart (See row information)  Yes - validated most recent copy scanned in chart (See row information) No - copy requested No - copy requested Yes - validated most recent copy scanned in chart (See row information)  Would patient like information on creating a  medical advance directive?   No - Patient declined        Current Medications (verified) Outpatient Encounter Medications as of 01/15/2024  Medication Sig   acetaminophen  (TYLENOL ) 500 MG tablet Take 1,000 mg by mouth every 8 (eight) hours as needed for moderate pain (pain score 4-6).   albuterol  (VENTOLIN  HFA) 108 (90 Base) MCG/ACT inhaler Inhale 2 puffs into the lungs every 6 (six) hours as needed for wheezing or shortness of breath.   ALPRAZolam  (XANAX ) 0.25 MG tablet Take 1 tablet (0.25 mg total) by mouth 2 (two) times daily as needed for anxiety.   amLODipine  (NORVASC ) 5 MG tablet Take 1 tablet (5 mg total) by mouth daily.   ascorbic acid (VITAMIN C) 500 MG tablet Take 500 mg by mouth daily.   aspirin  81 MG tablet Take 81 mg by mouth at bedtime.   atorvastatin  (LIPITOR) 10 MG tablet TAKE 1 TABLET(10 MG) BY MOUTH DAILY   betamethasone  valerate ointment (VALISONE ) 0.1 % Apply 1 Application topically 2 (two) times daily. Place in the affected area twice a bid for 2 weeks and then place on the area at night twice weekly.   blood glucose meter kit and supplies KIT Dispense based on patient and insurance preference. Use up to four times daily as directed.   Carboxymethylcellul-Glycerin (LUBRICATING EYE DROPS OP) Place 1 drop into both eyes daily as needed (dry eyes).   cholecalciferol (VITAMIN D3) 25 MCG (1000 UT) tablet Take 1,000 Units  by mouth daily.   citalopram  (CELEXA ) 20 MG tablet TAKE 1 TABLET(20 MG) BY MOUTH DAILY   diclofenac  Sodium (VOLTAREN ) 1 % GEL Apply 2 g topically 4 (four) times daily.   Emollient (GOLD BOND DIABETICS DRY SKIN) CREA Apply 1 application. topically at bedtime.   furosemide  (LASIX ) 20 MG tablet Take one tab daily x 3 days as needed for swelling in legs   glucose blood test strip Use to check blood glucose TID   Iron Combinations (CHROMAGEN) capsule Take 1 capsule by mouth daily.   Lancets 28G MISC Use to check blood glucose 4 times daily   levothyroxine   (SYNTHROID ) 88 MCG tablet TAKE 1 TABLET(88 MCG) BY MOUTH DAILY   Lidocaine  4 % AERO Apply 1 spray topically at bedtime as needed (foot pain).   losartan -hydrochlorothiazide  (HYZAAR) 100-25 MG tablet TAKE 1 TABLET BY MOUTH DAILY   metFORMIN  (GLUCOPHAGE ) 1000 MG tablet Take 1 tablet (1,000 mg total) by mouth 2 (two) times daily with a meal.   Misc. Devices (TRANSFER BENCH) MISC Use when getting in and out of shower   ondansetron  (ZOFRAN ) 4 MG tablet Take 1 tablet (4 mg total) by mouth every 8 (eight) hours as needed for nausea or vomiting.   pantoprazole  (PROTONIX ) 40 MG tablet Take 1 tablet (40 mg total) by mouth daily.   pramipexole  (MIRAPEX ) 1.5 MG tablet TAKE 1 TABLET(1.5 MG) BY MOUTH AT BEDTIME   traMADol (ULTRAM) 50 MG tablet Take by mouth.   vitamin B-12 (CYANOCOBALAMIN ) 1000 MCG tablet Take 1,000 mcg by mouth daily.   No facility-administered encounter medications on file as of 01/15/2024.    Allergies (verified) Benadryl [diphenhydramine hcl], Pentazocine lactate, Ace inhibitors, Amoxicillin, Aspirin , Chlorphen-phenyleph-methscop, Codeine, Fish allergy, Hydrocodone , Indomethacin, Promethazine hcl, and Quinine   History: Past Medical History:  Diagnosis Date   Allergy    Anemia    Anxiety    ANXIETY DEPRESSION 09/15/2007   Arthritis    ASTHMA 05/15/2009   Asthma    DIABETES MELLITUS, TYPE II 12/11/2006   DIVERTICULOSIS, COLON 12/11/2006   Edema 01/15/2009   Gout    pt denies; toe was broken   Headache(784.0) 12/11/2006   Hyperlipidemia    Hypertension    HYPERTENSION NEC 08/14/2007   HYPOTHYROIDISM 12/11/2006   PELVIC PAIN, CHRONIC 08/14/2007   Restless leg syndrome    Past Surgical History:  Procedure Laterality Date   ABDOMINAL HYSTERECTOMY     APPENDECTOMY     Bladder Suspension     COLONOSCOPY  10/08/2017   Dr. Abran   LEFT HEART CATH AND CORONARY ANGIOGRAPHY N/A 09/05/2021   Procedure: LEFT HEART CATH AND CORONARY ANGIOGRAPHY;  Surgeon: Burnard Debby LABOR, MD;   Location: [facility];  Service: Cardiovascular;  Laterality: N/A;   UPPER GASTROINTESTINAL ENDOSCOPY     Family History  Problem Relation Age of Onset   COPD Mother        smoker   Diabetes Mother    COPD Father        smoker   Diabetes Father    Diabetes Sister    Diabetes Mellitus II Sister    Diabetes Sister    Diabetes Sister    Diabetes Sister    Lung cancer Nephew 60   Breast cancer Other 36   Colon cancer Neg Hx    Esophageal cancer Neg Hx    Pancreatic cancer Neg Hx    Rectal cancer Neg Hx    Stomach cancer Neg Hx    Social  History   Socioeconomic History   Marital status: Widowed    Spouse name: Not on file   Number of children: Not on file   Years of education: Not on file   Highest education level: 12th grade  Occupational History   Not on file  Tobacco Use   Smoking status: Never   Smokeless tobacco: Never  Vaping Use   Vaping status: Never Used  Substance and Sexual Activity   Alcohol use: Yes    Comment: wine occ   Drug use: No   Sexual activity: Not Currently    Comment: hysterectomy, <5 sexual partners, <16 y/o, no STD  Other Topics Concern   Not on file  Social History Narrative   Not on file   Social Drivers of Health   Financial Resource Strain: Low Risk  (01/15/2024)   Overall Financial Resource Strain (CARDIA)    Difficulty of Paying Living Expenses: Not hard at all  Food Insecurity: No Food Insecurity (01/15/2024)   Hunger Vital Sign    Worried About Running Out of Food in the Last Year: Never true    Ran Out of Food in the Last Year: Never true  Recent Concern: Food Insecurity - Food Insecurity Present (11/12/2023)   Hunger Vital Sign    Worried About Running Out of Food in the Last Year: Sometimes true    Ran Out of Food in the Last Year: Sometimes true  Transportation Needs: No Transportation Needs (01/15/2024)   PRAPARE - Administrator, Civil Service (Medical): No    Lack of Transportation (Non-Medical): No   Physical Activity: Insufficiently Active (01/15/2024)   Exercise Vital Sign    Days of Exercise per Week: 2 days    Minutes of Exercise per Session: 30 min  Stress: No Stress Concern Present (01/15/2024)   [employer] of Occupational Health - Occupational Stress Questionnaire    Feeling of Stress: Not at all  Social Connections: Moderately Integrated (01/15/2024)   Social Connection and Isolation Panel    Frequency of Communication with Friends and Family: More than three times a week    Frequency of Social Gatherings with Friends and Family: More than three times a week    Attends Religious Services: More than 4 times per year    Active Member of [employer] or Organizations: Yes    Attends Banker Meetings: More than 4 times per year    Marital Status: Widowed    Tobacco Counseling Counseling given: Not Answered    Clinical Intake:  Pre-visit preparation completed: Yes  Pain : No/denies pain     BMI - recorded: 30.13 Nutritional Status: BMI > 30  Obese Nutritional Risks: None Diabetes: Yes CBG done?: No Did pt. bring in CBG monitor from home?: No  Lab Results  Component Value Date   HGBA1C 7.4 (H) 06/13/2023   HGBA1C 6.9 (A) 02/22/2023   HGBA1C 7.0 (A) 11/22/2022     How often do you need to have someone help you when you read instructions, pamphlets, or other written materials from your doctor or pharmacy?: 1 - Never  Interpreter Needed?: No  Information entered by :: Carol Blush LPN   Activities of Daily Living     01/15/2024   11:37 AM 01/14/2024    4:40 PM  In your present state of health, do you have any difficulty performing the following activities:  Hearing? 1 1  Comment Wears Hearing Aids   Vision? 0 0  Difficulty concentrating or making  decisions? 0 0  Walking or climbing stairs? 1 1  Comment Uses a Cane   Dressing or bathing? 0 0  Doing errands, shopping? 0 0  Preparing Food and eating ? N N  Using the Toilet? N N  In the  past six months, have you accidently leaked urine? [name] [name]  Comment Wears Pads. Followed by PCP   Do you have problems with loss of bowel control? N Y  Managing your Medications? N N  Managing your Finances? N N  Housekeeping or managing your Housekeeping? N N    Patient Care Team: Merna Huxley, NP as PCP - General (Family Medicine) Acharya, Gayatri A, MD as PCP - Cardiology (Cardiology)  I have updated your Care Teams any recent Medical Services you may have received from other providers in the past year.     Assessment:   This is a routine wellness examination for [name].  Hearing/Vision screen Hearing Screening - Comments:: Wears Hearing Aids Vision Screening - Comments:: Wears rx glasses - up to date with [facility] exams with  [facility]   Goals Addressed               This Visit's Progress     Continue physical activity (pt-stated)        Stay active       Depression Screen     01/15/2024   11:15 AM 07/02/2023    8:48 AM 03/22/2023    9:33 AM 02/22/2023    9:04 AM 01/11/2023   11:25 AM 01/11/2023   11:16 AM 11/22/2022    8:27 AM  PHQ 2/9 Scores  PHQ - 2 Score 0 0 0 2 0 0 2  PHQ- 9 Score  7 5 10  0 0 7    Fall Risk     01/15/2024   11:39 AM 01/14/2024    4:40 PM 03/22/2023    9:32 AM 02/22/2023    9:05 AM 01/11/2023   11:45 AM  Fall Risk   Falls in the past year? 1 1 1 1 1   Number falls in past yr: 0 1 1 1    Injury with Fall? 1 1 0 0   Comment Re injured Rt Knee. Followed by medical attention      Risk for fall due to : No Fall Risks  Other (Comment) History of fall(s)   Follow up Falls evaluation completed  Falls evaluation completed      MEDICARE RISK AT HOME:  Medicare Risk at Home Any stairs in or around the home?: Yes If so, are there any without handrails?: No Home free of loose throw rugs in walkways, pet beds, electrical cords, etc?: No Life alert?: No Use of a cane, walker or w/c?: Yes Grab bars in the bathroom?: Yes Shower chair or  bench in shower?: Yes Elevated toilet seat or a handicapped toilet?: No  TIMED UP AND GO:  Was the test performed?  Yes  Length of time to ambulate 10 feet: 10 sec Gait slow and steady without use of assistive device  Cognitive Function: 6CIT completed        01/15/2024   11:41 AM 01/11/2023   11:28 AM 01/04/2022    3:53 PM 01/01/2020   10:15 AM  6CIT Screen  What Year? 0 points 0 points 0 points 0 points  What month? 0 points 0 points 0 points 0 points  What time? 0 points 0 points 0 points 0 points  Count back from 20 0 points  0 points 0 points 0 points  Months in reverse 0 points 0 points 0 points 0 points  Repeat phrase 0 points 0 points 0 points 0 points  Total Score 0 points 0 points 0 points 0 points    Immunizations Immunization History  Administered Date(s) Administered   Dtap, Unspecified 12/18/2022   Fluad Quad(high Dose 65+) 01/05/2021, 01/17/2022   INFLUENZA, HIGH DOSE SEASONAL PF 04/22/2018   Influenza Whole 01/18/2009   Influenza,inj,Quad PF,6+ Mos 05/20/2014, 01/23/2017   Influenza-Unspecified 01/15/2015, 01/17/2022, 01/15/2023   PFIZER(Purple Top)SARS-COV-2 Vaccination 06/27/2019, 07/21/2019, 03/23/2020, 07/25/2020   Pfizer Covid-19 Vaccine Bivalent Booster 70yrs & up 03/08/2021   Pfizer Covid-19 Vaccine Bivalent Booster 5y-11y 02/14/2021   Pneumococcal Conjugate-13 04/22/2018   Pneumococcal Polysaccharide-23 05/20/2014, 11/03/2019   Td 04/16/2005, 03/20/2012   Tdap 12/18/2022   Unspecified SARS-COV-2 Vaccination 02/22/2021, 01/17/2022   Zoster, Live 05/28/2014    Screening Tests Health Maintenance  Topic Date Due   Zoster Vaccines- Shingrix (1 of 2) 06/21/2002   OPHTHALMOLOGY EXAM  05/16/2023   Influenza Vaccine  11/15/2023   HEMOGLOBIN A1C  12/11/2023   COVID-19 Vaccine (9 - 2025-26 season) 12/16/2023   FOOT EXAM  05/22/2024   Diabetic kidney evaluation - Urine ACR  06/12/2024   Mammogram  06/12/2024   Diabetic kidney evaluation - eGFR  measurement  09/12/2024   Medicare Annual Wellness (AWV)  01/14/2025   Colonoscopy  09/20/2027   DTaP/Tdap/Td (5 - Td or Tdap) 12/17/2032   Pneumococcal Vaccine: 50+ Years  Completed   DEXA SCAN  Completed   Hepatitis C Screening  Completed   HPV VACCINES  Aged Out   Meningococcal B Vaccine  Aged Out    Health Maintenance Items Addressed:   Additional Screening:  Vision Screening: Recommended annual ophthalmology exams for early detection of glaucoma and other disorders of the eye. Is the patient up to date with their annual eye exam?  Yes  Who is the provider or what is the name of the office in which the patient attends annual eye exams? [facility]  Dental Screening: Recommended annual dental exams for proper oral hygiene  Community Resource Referral / Chronic Care Management: CRR required this visit?  No   CCM required this visit?  No   Plan:    I have personally reviewed and noted the following in the patient's chart:   Medical and social history Use of alcohol, tobacco or illicit drugs  Current medications and supplements including opioid prescriptions. Patient is currently taking opioid prescriptions. Information provided to patient regarding non-opioid alternatives. Patient advised to discuss non-opioid treatment plan with their provider. Functional ability and status Nutritional status Physical activity Advanced directives List of other physicians Hospitalizations, surgeries, and ER visits in previous 12 months Vitals Screenings to include cognitive, depression, and falls Referrals and appointments  In addition, I have reviewed and discussed with patient certain preventive protocols, quality metrics, and best practice recommendations. A written personalized care plan for preventive services as well as general preventive health recommendations were provided to patient.   Carol LELON Blush, LPN   89/11/7972   After Visit Summary: (In Person-Printed) AVS  printed and given to the patient  Notes: Nothing significant to report at this time.

## 2024-01-16 ENCOUNTER — Ambulatory Visit: Admitting: Adult Health

## 2024-01-16 ENCOUNTER — Encounter: Payer: Self-pay | Admitting: Adult Health

## 2024-01-16 VITALS — BP 120/60 | HR 63 | Temp 97.5°F | Ht 63.5 in | Wt 174.0 lb

## 2024-01-16 DIAGNOSIS — L0231 Cutaneous abscess of buttock: Secondary | ICD-10-CM

## 2024-01-16 MED ORDER — DOXYCYCLINE HYCLATE 100 MG PO CAPS: 100.0000 mg | ORAL_CAPSULE | Freq: Two times a day (BID) | ORAL | 0 refills | Status: DC

## 2024-01-16 NOTE — Progress Notes (Signed)
 Subjective:    Patient ID: [name], female    DOB: 06/13/1950, 73 y.o.   MRN: [id number]  HPI 73 year old female who  has a past medical history of Allergy, Anemia, Anxiety, ANXIETY DEPRESSION (09/15/2007), Arthritis, ASTHMA (05/15/2009), Asthma, DIABETES MELLITUS, TYPE II (12/11/2006), DIVERTICULOSIS, COLON (12/11/2006), Edema (01/15/2009), Gout, Headache(784.0) (12/11/2006), Hyperlipidemia, Hypertension, HYPERTENSION NEC (08/14/2007), HYPOTHYROIDISM (12/11/2006), PELVIC PAIN, CHRONIC (08/14/2007), and Restless leg syndrome.  She presents to the office today for left sided buttock pain. She reports that she was on a bus trip for a week and was sitting a lot. Once she returned home she started to develop pain in her left buttock and feels a  lump.   At home she has been soaking in her tub which provides some relief.    Review of Systems See HPI   Past Medical History:  Diagnosis Date   Allergy    Anemia    Anxiety    ANXIETY DEPRESSION 09/15/2007   Arthritis    ASTHMA 05/15/2009   Asthma    DIABETES MELLITUS, TYPE II 12/11/2006   DIVERTICULOSIS, COLON 12/11/2006   Edema 01/15/2009   Gout    pt denies; toe was broken   Headache(784.0) 12/11/2006   Hyperlipidemia    Hypertension    HYPERTENSION NEC 08/14/2007   HYPOTHYROIDISM 12/11/2006   PELVIC PAIN, CHRONIC 08/14/2007   Restless leg syndrome     Social History   Socioeconomic History   Marital status: Widowed    Spouse name: Not on file   Number of children: Not on file   Years of education: Not on file   Highest education level: 12th grade  Occupational History   Not on file  Tobacco Use   Smoking status: Never   Smokeless tobacco: Never  Vaping Use   Vaping status: Never Used  Substance and Sexual Activity   Alcohol use: Yes    Comment: wine occ   Drug use: No   Sexual activity: Not Currently    Comment: hysterectomy, <5 sexual partners, <18 y/o, no STD  Other Topics Concern   Not on  file  Social History Narrative   Not on file   Social Drivers of Health   Financial Resource Strain: Low Risk  (01/15/2024)   Overall Financial Resource Strain (CARDIA)    Difficulty of Paying Living Expenses: Not hard at all  Food Insecurity: No Food Insecurity (01/15/2024)   Hunger Vital Sign    Worried About Running Out of Food in the Last Year: Never true    Ran Out of Food in the Last Year: Never true  Recent Concern: Food Insecurity - Food Insecurity Present (11/12/2023)   Hunger Vital Sign    Worried About Running Out of Food in the Last Year: Sometimes true    Ran Out of Food in the Last Year: Sometimes true  Transportation Needs: No Transportation Needs (01/15/2024)   PRAPARE - Administrator, Civil Service (Medical): No    Lack of Transportation (Non-Medical): No  Physical Activity: Insufficiently Active (01/15/2024)   Exercise Vital Sign    Days of Exercise per Week: 2 days    Minutes of Exercise per Session: 30 min  Stress: No Stress Concern Present (01/15/2024)   [employer] of Occupational Health - Occupational Stress Questionnaire    Feeling of Stress: Not at all  Social Connections: Moderately Integrated (01/15/2024)   Social Connection and Isolation Panel    Frequency of Communication with Friends and Family:  More than three times a week    Frequency of Social Gatherings with Friends and Family: More than three times a week    Attends Religious Services: More than 4 times per year    Active Member of Clubs or Organizations: Yes    Attends Banker Meetings: More than 4 times per year    Marital Status: Widowed  Intimate Partner Violence: Not At Risk (01/15/2024)   Humiliation, Afraid, Rape, and Kick questionnaire    Fear of Current or Ex-Partner: No    Emotionally Abused: No    Physically Abused: No    Sexually Abused: No    Past Surgical History:  Procedure Laterality Date   ABDOMINAL HYSTERECTOMY     APPENDECTOMY     Bladder  Suspension     COLONOSCOPY  10/08/2017   Dr. Abran   LEFT HEART CATH AND CORONARY ANGIOGRAPHY N/A 09/05/2021   Procedure: LEFT HEART CATH AND CORONARY ANGIOGRAPHY;  Surgeon: Burnard Debby LABOR, MD;  Location: [facility];  Service: Cardiovascular;  Laterality: N/A;   UPPER GASTROINTESTINAL ENDOSCOPY      Family History  Problem Relation Age of Onset   COPD Mother        smoker   Diabetes Mother    COPD Father        smoker   Diabetes Father    Diabetes Sister    Diabetes Mellitus II Sister    Diabetes Sister    Diabetes Sister    Diabetes Sister    Lung cancer Nephew 60   Breast cancer Other 74   Colon cancer Neg Hx    Esophageal cancer Neg Hx    Pancreatic cancer Neg Hx    Rectal cancer Neg Hx    Stomach cancer Neg Hx     Allergies  Allergen Reactions   Benadryl [Diphenhydramine Hcl] Anaphylaxis   Pentazocine Lactate Other (See Comments)    passing out   Ace Inhibitors Cough   Amoxicillin Nausea And Vomiting    Unknown reaction   Aspirin  Nausea And Vomiting    Tolerates low dose aspirin     Chlorphen-Phenyleph-Methscop Rash   Codeine Nausea And Vomiting   Fish Allergy Nausea And Vomiting   Hydrocodone  Nausea And Vomiting   Indomethacin Nausea And Vomiting    Unknown reaction    Promethazine Hcl Other (See Comments)    Unknown reaction    Quinine Nausea And Vomiting    itching redface    Current Outpatient Medications on File Prior to Visit  Medication Sig Dispense Refill   acetaminophen  (TYLENOL ) 500 MG tablet Take 1,000 mg by mouth every 8 (eight) hours as needed for moderate pain (pain score 4-6).     albuterol  (VENTOLIN  HFA) 108 (90 Base) MCG/ACT inhaler Inhale 2 puffs into the lungs every 6 (six) hours as needed for wheezing or shortness of breath. 8 g 0   ALPRAZolam  (XANAX ) 0.25 MG tablet Take 1 tablet (0.25 mg total) by mouth 2 (two) times daily as needed for anxiety. 60 tablet 0   amLODipine  (NORVASC ) 5 MG tablet Take 1 tablet (5 mg total) by mouth  daily. 90 tablet 1   ascorbic acid (VITAMIN C) 500 MG tablet Take 500 mg by mouth daily.     aspirin  81 MG tablet Take 81 mg by mouth at bedtime.     atorvastatin  (LIPITOR) 10 MG tablet TAKE 1 TABLET(10 MG) BY MOUTH DAILY 90 tablet 3   betamethasone  valerate ointment (VALISONE ) 0.1 % Apply 1  Application topically 2 (two) times daily. Place in the affected area twice a bid for 2 weeks and then place on the area at night twice weekly. 45 g 0   blood glucose meter kit and supplies KIT Dispense based on patient and insurance preference. Use up to four times daily as directed. 1 each 0   Carboxymethylcellul-Glycerin (LUBRICATING EYE DROPS OP) Place 1 drop into both eyes daily as needed (dry eyes).     cholecalciferol (VITAMIN D3) 25 MCG (1000 UT) tablet Take 1,000 Units by mouth daily.     citalopram  (CELEXA ) 20 MG tablet TAKE 1 TABLET(20 MG) BY MOUTH DAILY 90 tablet 1   diclofenac  Sodium (VOLTAREN ) 1 % GEL Apply 2 g topically 4 (four) times daily. 150 g 0   Emollient (GOLD BOND DIABETICS DRY SKIN) CREA Apply 1 application. topically at bedtime.     furosemide  (LASIX ) 20 MG tablet Take one tab daily x 3 days as needed for swelling in legs 90 tablet 0   glucose blood test strip Use to check blood glucose TID 200 each 0   Iron Combinations (CHROMAGEN) capsule Take 1 capsule by mouth daily.     Lancets 28G MISC Use to check blood glucose 4 times daily 100 each 0   levothyroxine  (SYNTHROID ) 88 MCG tablet TAKE 1 TABLET(88 MCG) BY MOUTH DAILY 90 tablet 3   Lidocaine  4 % AERO Apply 1 spray topically at bedtime as needed (foot pain).     losartan -hydrochlorothiazide  (HYZAAR) 100-25 MG tablet TAKE 1 TABLET BY MOUTH DAILY 90 tablet 3   metFORMIN  (GLUCOPHAGE ) 1000 MG tablet Take 1 tablet (1,000 mg total) by mouth 2 (two) times daily with a meal. 180 tablet 3   Misc. Devices (TRANSFER BENCH) MISC Use when getting in and out of shower 1 each 0   ondansetron  (ZOFRAN ) 4 MG tablet Take 1 tablet (4 mg total) by mouth  every 8 (eight) hours as needed for nausea or vomiting. 20 tablet 0   pantoprazole  (PROTONIX ) 40 MG tablet Take 1 tablet (40 mg total) by mouth daily. 30 tablet 3   pramipexole  (MIRAPEX ) 1.5 MG tablet TAKE 1 TABLET(1.5 MG) BY MOUTH AT BEDTIME 90 tablet 1   traMADol (ULTRAM) 50 MG tablet Take by mouth.     vitamin B-12 (CYANOCOBALAMIN ) 1000 MCG tablet Take 1,000 mcg by mouth daily.     No current facility-administered medications on file prior to visit.    BP 120/60   Pulse 63   Temp (!) 97.5 F (36.4 C) (Oral)   Ht 5' 3.5 (1.613 m)   Wt 174 lb (78.9 kg)   SpO2 97%   BMI 30.34 kg/m       Objective:   Physical Exam Vitals and nursing note reviewed.  Constitutional:      Appearance: Normal appearance.  Skin:    General: Skin is warm and dry.     Findings: Abscess present.      Neurological:     General: No focal deficit present.     Mental Status: She is alert and oriented to person, place, and time.  Psychiatric:        Mood and Affect: Mood normal.        Behavior: Behavior normal.        Thought Content: Thought content normal.        Judgment: Judgment normal.       Assessment & Plan:  1. Abscess of buttock, left (Primary) I&D not advised at this time. Will  place on Doxycycline  BID x 10 days  - Follow up instructions reviewed  - doxycycline  (VIBRAMYCIN ) 100 MG capsule; Take 1 capsule (100 mg total) by mouth 2 (two) times daily.  Dispense: 120 capsule; Refill: 0  Darleene Shape, NP

## 2024-01-17 ENCOUNTER — Other Ambulatory Visit: Payer: Self-pay

## 2024-01-17 ENCOUNTER — Telehealth: Payer: Self-pay | Admitting: *Deleted

## 2024-01-17 NOTE — Telephone Encounter (Signed)
 Copied from CRM [id number]. Topic: General - Other >> Jan 17, 2024  3:39 PM Thersia C wrote: Reason for CRM: Patient called in would like for Marjorie to give her a callback

## 2024-01-17 NOTE — Telephone Encounter (Signed)
 Pt called regarding price of abx. I advised pt that the reason the medication was so high is bc pt hasve not met her deductible yet. Pt verbalized understanding.

## 2024-01-25 DIAGNOSIS — R110 Nausea: Secondary | ICD-10-CM | POA: Diagnosis not present

## 2024-01-25 DIAGNOSIS — E079 Disorder of thyroid, unspecified: Secondary | ICD-10-CM | POA: Diagnosis not present

## 2024-01-25 DIAGNOSIS — R079 Chest pain, unspecified: Secondary | ICD-10-CM | POA: Diagnosis not present

## 2024-01-25 DIAGNOSIS — I10 Essential (primary) hypertension: Secondary | ICD-10-CM | POA: Diagnosis not present

## 2024-01-25 DIAGNOSIS — E876 Hypokalemia: Secondary | ICD-10-CM | POA: Diagnosis not present

## 2024-01-25 DIAGNOSIS — J9811 Atelectasis: Secondary | ICD-10-CM | POA: Diagnosis not present

## 2024-01-25 DIAGNOSIS — I2510 Atherosclerotic heart disease of native coronary artery without angina pectoris: Secondary | ICD-10-CM | POA: Diagnosis not present

## 2024-01-25 DIAGNOSIS — Z7982 Long term (current) use of aspirin: Secondary | ICD-10-CM | POA: Diagnosis not present

## 2024-01-25 DIAGNOSIS — R7989 Other specified abnormal findings of blood chemistry: Secondary | ICD-10-CM | POA: Diagnosis not present

## 2024-01-25 DIAGNOSIS — J984 Other disorders of lung: Secondary | ICD-10-CM | POA: Diagnosis not present

## 2024-01-25 DIAGNOSIS — M940 Chondrocostal junction syndrome [Tietze]: Secondary | ICD-10-CM | POA: Diagnosis not present

## 2024-01-25 DIAGNOSIS — Z7984 Long term (current) use of oral hypoglycemic drugs: Secondary | ICD-10-CM | POA: Diagnosis not present

## 2024-01-25 DIAGNOSIS — E119 Type 2 diabetes mellitus without complications: Secondary | ICD-10-CM | POA: Diagnosis not present

## 2024-01-25 DIAGNOSIS — Z87891 Personal history of nicotine dependence: Secondary | ICD-10-CM | POA: Diagnosis not present

## 2024-01-25 DIAGNOSIS — Z79899 Other long term (current) drug therapy: Secondary | ICD-10-CM | POA: Diagnosis not present

## 2024-01-26 ENCOUNTER — Encounter: Payer: Self-pay | Admitting: Adult Health

## 2024-01-29 NOTE — Telephone Encounter (Signed)
 FYI

## 2024-01-30 ENCOUNTER — Encounter: Payer: Self-pay | Admitting: Adult Health

## 2024-01-30 ENCOUNTER — Ambulatory Visit: Admitting: Adult Health

## 2024-01-30 VITALS — BP 120/60 | HR 69 | Temp 97.0°F | Ht 63.5 in | Wt 172.0 lb

## 2024-01-30 DIAGNOSIS — Z7984 Long term (current) use of oral hypoglycemic drugs: Secondary | ICD-10-CM | POA: Diagnosis not present

## 2024-01-30 DIAGNOSIS — R10A2 Flank pain, left side: Secondary | ICD-10-CM

## 2024-01-30 DIAGNOSIS — R2681 Unsteadiness on feet: Secondary | ICD-10-CM | POA: Diagnosis not present

## 2024-01-30 DIAGNOSIS — M26609 Unspecified temporomandibular joint disorder, unspecified side: Secondary | ICD-10-CM | POA: Diagnosis not present

## 2024-01-30 DIAGNOSIS — L0231 Cutaneous abscess of buttock: Secondary | ICD-10-CM

## 2024-01-30 DIAGNOSIS — E119 Type 2 diabetes mellitus without complications: Secondary | ICD-10-CM

## 2024-01-30 MED ORDER — CEPHALEXIN 500 MG PO CAPS: 500.0000 mg | ORAL_CAPSULE | Freq: Two times a day (BID) | ORAL | 0 refills | Status: AC

## 2024-01-30 MED ORDER — PREDNISONE 10 MG PO TABS: 10.0000 mg | ORAL_TABLET | Freq: Every day | ORAL | 0 refills | Status: AC

## 2024-01-30 NOTE — Progress Notes (Signed)
 Subjective:    Patient ID: [name], female    DOB: [birth year], 73 y.o.   MRN: [id number]  HPI 73 year old female who  has a past medical history of Allergy, Anemia, Anxiety, ANXIETY DEPRESSION (09/15/2007), Arthritis, ASTHMA (05/15/2009), Asthma, DIABETES MELLITUS, TYPE II (12/11/2006), DIVERTICULOSIS, COLON (12/11/2006), Edema (01/15/2009), Gout, Headache(784.0) (12/11/2006), Hyperlipidemia, Hypertension, HYPERTENSION NEC (08/14/2007), HYPOTHYROIDISM (12/11/2006), PELVIC PAIN, CHRONIC (08/14/2007), and Restless leg syndrome.  She presents to the office today for follow up after being seen in the ER.  She presented to [facility] emergency room with left-sided chest pain that started at home prior.  She does endorse nausea.  Pain was not radiating. She did report that she fell the previous night and earlier in the week due to feeling unsteady on her feet.   Her workup was unremarkable except for a magnesium of 1.2.  Chest x-ray showed atelectasis but no acute cardiopulmonary.  EKG shows sinus rhythm without ischemia.  Pain did improve with Decadron  and Robaxin.  Discharged on Mag Ox and Robaxin.   Today she reports that she did not take her magnesium for 3 days without a prescription.  She has been taking her Robaxin but continues to have some left-sided chest/flank pain likely from a recent fall.  Furthermore she reports that she was eating and sandwich last night and then developed a sudden severe pain with a popping sensation.  Since that time she has had significant unable to chew.   She also complains of nausea but seems to be improving.  She was seen on 01/16/2024 for an abscess on her left buttock.  She was prescribed doxycycline  for this to take 3 times daily and she took it for 14 medication.  While she was on the doxycycline  she does that since stopping the antibiotic this has improved.  She does still has some minor pain in her left buttock.  Has not  noticed any manage.  She is due for follow up regarding DM  she currently prescribed metformin  1000 mg BID.  She does not check blood sugar routinely. She is going to start working with a diabetic nutritionist next week.  Lab Results  Component Value Date   HGBA1C 7.4 (H) 06/13/2023   HGBA1C 6.9 (A) 02/22/2023   HGBA1C 7.0 (A) 11/22/2022    Review of Systems See HPI   Past Medical History:  Diagnosis Date   Allergy    Anemia    Anxiety    ANXIETY DEPRESSION 09/15/2007   Arthritis    ASTHMA 05/15/2009   Asthma    DIABETES MELLITUS, TYPE II 12/11/2006   DIVERTICULOSIS, COLON 12/11/2006   Edema 01/15/2009   Gout    pt denies; toe was broken   Headache(784.0) 12/11/2006   Hyperlipidemia    Hypertension    HYPERTENSION NEC 08/14/2007   HYPOTHYROIDISM 12/11/2006   PELVIC PAIN, CHRONIC 08/14/2007   Restless leg syndrome     Social History   Socioeconomic History   Marital status: Widowed    Spouse name: Not on file   Number of children: Not on file   Years of education: Not on file   Highest education level: 12th grade  Occupational History   Not on file  Tobacco Use   Smoking status: Never   Smokeless tobacco: Never  Vaping Use   Vaping status: Never Used  Substance and Sexual Activity   Alcohol use: Yes    Comment: wine occ   Drug use: No   Sexual  activity: Not Currently    Comment: hysterectomy, <5 sexual partners, <18 y/o, no STD  Other Topics Concern   Not on file  Social History Narrative   Not on file   Social Drivers of Health   Financial Resource Strain: Low Risk  (01/15/2024)   Overall Financial Resource Strain (CARDIA)    Difficulty of Paying Living Expenses: Not hard at all  Food Insecurity: No Food Insecurity (01/15/2024)   Hunger Vital Sign    Worried About Running Out of Food in the Last Year: Never true    Ran Out of Food in the Last Year: Never true  Recent Concern: Food Insecurity - Food Insecurity Present (11/12/2023)   Hunger Vital  Sign    Worried About Running Out of Food in the Last Year: Sometimes true    Ran Out of Food in the Last Year: Sometimes true  Transportation Needs: No Transportation Needs (01/15/2024)   PRAPARE - Administrator, Civil Service (Medical): No    Lack of Transportation (Non-Medical): No  Physical Activity: Insufficiently Active (01/15/2024)   Exercise Vital Sign    Days of Exercise per Week: 2 days    Minutes of Exercise per Session: 30 min  Stress: No Stress Concern Present (01/15/2024)   [employer] of Occupational Health - Occupational Stress Questionnaire    Feeling of Stress: Not at all  Social Connections: Moderately Integrated (01/15/2024)   Social Connection and Isolation Panel    Frequency of Communication with Friends and Family: More than three times a week    Frequency of Social Gatherings with Friends and Family: More than three times a week    Attends Religious Services: More than 4 times per year    Active Member of [employer] or Organizations: Yes    Attends Banker Meetings: More than 4 times per year    Marital Status: Widowed  Intimate Partner Violence: Not At Risk (01/25/2024)   Received from [facility]   HITS    Over the last 12 months how often did your partner physically hurt you?: Never    Over the last 12 months how often did your partner insult you or talk down to you?: Never    Over the last 12 months how often did your partner threaten you with physical harm?: Never    Over the last 12 months how often did your partner scream or curse at you?: Never    Past Surgical History:  Procedure Laterality Date   ABDOMINAL HYSTERECTOMY     APPENDECTOMY     Bladder Suspension     COLONOSCOPY  10/08/2017   Dr. Abran   LEFT HEART CATH AND CORONARY ANGIOGRAPHY N/A 09/05/2021   Procedure: LEFT HEART CATH AND CORONARY ANGIOGRAPHY;  Surgeon: Burnard Debby LABOR, MD;  Location: [facility] INVASIVE CV LAB;  Service: Cardiovascular;  Laterality: N/A;   UPPER  GASTROINTESTINAL ENDOSCOPY      Family History  Problem Relation Age of Onset   COPD Mother        smoker   Diabetes Mother    COPD Father        smoker   Diabetes Father    Diabetes Sister    Diabetes Mellitus II Sister    Diabetes Sister    Diabetes Sister    Diabetes Sister    Lung cancer Nephew 60   Breast cancer Other 16   Colon cancer Neg Hx    Esophageal cancer Neg Hx    Pancreatic  cancer Neg Hx    Rectal cancer Neg Hx    Stomach cancer Neg Hx     Allergies  Allergen Reactions   Benadryl [Diphenhydramine Hcl] Anaphylaxis   Pentazocine Lactate Other (See Comments)    passing out   Ace Inhibitors Cough   Amoxicillin Nausea And Vomiting    Unknown reaction   Aspirin  Nausea And Vomiting    Tolerates low dose aspirin     Chlorphen-Phenyleph-Methscop Rash   Codeine Nausea And Vomiting   Fish Allergy Nausea And Vomiting   Hydrocodone  Nausea And Vomiting   Indomethacin Nausea And Vomiting    Unknown reaction    Promethazine Hcl Other (See Comments)    Unknown reaction    Quinine Nausea And Vomiting    itching redface    Current Outpatient Medications on File Prior to Visit  Medication Sig Dispense Refill   acetaminophen  (TYLENOL ) 500 MG tablet Take 1,000 mg by mouth every 8 (eight) hours as needed for moderate pain (pain score 4-6).     albuterol  (VENTOLIN  HFA) 108 (90 Base) MCG/ACT inhaler Inhale 2 puffs into the lungs every 6 (six) hours as needed for wheezing or shortness of breath. 8 g 0   ALPRAZolam  (XANAX ) 0.25 MG tablet Take 1 tablet (0.25 mg total) by mouth 2 (two) times daily as needed for anxiety. 60 tablet 0   amLODipine  (NORVASC ) 5 MG tablet Take 1 tablet (5 mg total) by mouth daily. 90 tablet 1   ascorbic acid (VITAMIN C) 500 MG tablet Take 500 mg by mouth daily.     aspirin  81 MG tablet Take 81 mg by mouth at bedtime.     atorvastatin  (LIPITOR) 10 MG tablet TAKE 1 TABLET(10 MG) BY MOUTH DAILY 90 tablet 3   betamethasone  valerate ointment (VALISONE )  0.1 % Apply 1 Application topically 2 (two) times daily. Place in the affected area twice a bid for 2 weeks and then place on the area at night twice weekly. 45 g 0   blood glucose meter kit and supplies KIT Dispense based on patient and insurance preference. Use up to four times daily as directed. 1 each 0   Carboxymethylcellul-Glycerin (LUBRICATING EYE DROPS OP) Place 1 drop into both eyes daily as needed (dry eyes).     cholecalciferol (VITAMIN D3) 25 MCG (1000 UT) tablet Take 1,000 Units by mouth daily.     citalopram  (CELEXA ) 20 MG tablet TAKE 1 TABLET(20 MG) BY MOUTH DAILY 90 tablet 1   diclofenac  Sodium (VOLTAREN ) 1 % GEL Apply 2 g topically 4 (four) times daily. 150 g 0   furosemide  (LASIX ) 20 MG tablet Take one tab daily x 3 days as needed for swelling in legs 90 tablet 0   glucose blood test strip Use to check blood glucose TID 200 each 0   Iron Combinations (CHROMAGEN) capsule Take 1 capsule by mouth daily.     Lancets 28G MISC Use to check blood glucose 4 times daily 100 each 0   levothyroxine  (SYNTHROID ) 88 MCG tablet TAKE 1 TABLET(88 MCG) BY MOUTH DAILY 90 tablet 3   Lidocaine  4 % AERO Apply 1 spray topically at bedtime as needed (foot pain).     losartan -hydrochlorothiazide  (HYZAAR) 100-25 MG tablet TAKE 1 TABLET BY MOUTH DAILY 90 tablet 3   magnesium oxide (MAG-OX) 400 (240 Mg) MG tablet Take 1 tablet by mouth daily.     metFORMIN  (GLUCOPHAGE ) 1000 MG tablet Take 1 tablet (1,000 mg total) by mouth 2 (two) times daily with a meal.  180 tablet 3   methocarbamol (ROBAXIN) 500 MG tablet Take 500 mg by mouth.     Misc. Devices (TRANSFER BENCH) MISC Use when getting in and out of shower 1 each 0   ondansetron  (ZOFRAN ) 4 MG tablet Take 1 tablet (4 mg total) by mouth every 8 (eight) hours as needed for nausea or vomiting. 20 tablet 0   pantoprazole  (PROTONIX ) 40 MG tablet Take 1 tablet (40 mg total) by mouth daily. 30 tablet 3   pramipexole  (MIRAPEX ) 1.5 MG tablet TAKE 1 TABLET(1.5 MG) BY  MOUTH AT BEDTIME 90 tablet 1   traMADol (ULTRAM) 50 MG tablet Take by mouth.     vitamin B-12 (CYANOCOBALAMIN ) 1000 MCG tablet Take 1,000 mcg by mouth daily.     No current facility-administered medications on file prior to visit.    BP 120/60   Pulse 69   Temp (!) 97 F (36.1 C) (Oral)   Ht 5' 3.5 (1.613 m)   Wt 172 lb (78 kg)   SpO2 97%   BMI 29.99 kg/m       Objective:   Physical Exam Vitals and nursing note reviewed.  Constitutional:      Appearance: Normal appearance. She is obese.  HENT:     Head:     Jaw: Tenderness and pain on movement present. No swelling.   Cardiovascular:     Rate and Rhythm: Normal rate and regular rhythm.     Pulses: Normal pulses.     Heart sounds: Normal heart sounds.  Pulmonary:     Effort: Pulmonary effort is normal.     Breath sounds: Normal breath sounds.  Musculoskeletal:        General: Normal range of motion.       Arms:  Skin:    General: Skin is warm and dry.      Neurological:     General: No focal deficit present.     Mental Status: She is alert and oriented to person, place, and time.  Psychiatric:        Mood and Affect: Mood normal.        Behavior: Behavior normal.        Thought Content: Thought content normal.        Judgment: Judgment normal.         Assessment & Plan:   1. Gait instability (Primary) - She has had multiple falls over the year. I am going to refer her to PT for gait training  - Ambulatory referral to Physical Therapy  2. Hypomagnesemia  - Magnesium; Future - Magnesium  3. Left flank pain - Likely muscular in origin  - Can use heating pad and continue Robaxin until gone   4. TMJ (temporomandibular joint disorder) - She is unable to take NSAIDS. Will send in a short course of prednisone . Can continue Robaxin. Use heating pad to left jaw - predniSONE  (DELTASONE ) 10 MG tablet; Take 1 tablet (10 mg total) by mouth daily with breakfast.  Dispense: 5 tablet; Refill: 0  5. Diabetes  mellitus treated with oral medication (HCC) - Consider adding agent  - Hemoglobin A1c; Future - Hemoglobin A1c  6. Abscess of buttock, left - Has decreased in size. Will send in Keflex for 7 days to hopefully get rid of it.  - cephALEXin (KEFLEX) 500 MG capsule; Take 1 capsule (500 mg total) by mouth 2 (two) times daily for 7 days.  Dispense: 14 capsule; Refill: 0   Darleene Shape, NP  I personally  spent a total of 50 minutes in the care of the patient today including preparing to see the patient, getting/reviewing separately obtained history, performing a medically appropriate exam/evaluation, counseling and educating, placing orders, and documenting clinical information in the EHR.

## 2024-01-31 ENCOUNTER — Ambulatory Visit: Payer: Self-pay | Admitting: Adult Health

## 2024-01-31 LAB — HEMOGLOBIN A1C: Hgb A1c MFr Bld: 7.1 % — ABNORMAL HIGH (ref 4.6–6.5)

## 2024-01-31 LAB — MAGNESIUM: Magnesium: 1.5 mg/dL (ref 1.5–2.5)

## 2024-02-03 NOTE — Therapy (Addendum)
 OUTPATIENT PHYSICAL THERAPY LOWER EXTREMITY EVALUATION   Patient Name: [name] MRN: [id number] DOB:1951/02/16, 73 y.o., female Today's Date: 02/04/2024  END OF SESSION:  PT End of Session - 02/04/24 1255     Visit Number 1    Number of Visits 12    Date for Recertification  03/17/24    Authorization Type [employer] medicare    PT Start Time 1155    PT Stop Time 1250    PT Time Calculation (min) 55 min    Activity Tolerance Patient tolerated treatment well    Behavior During Therapy [facility] for tasks assessed/performed          Past Medical History:  Diagnosis Date   Allergy    Anemia    Anxiety    ANXIETY DEPRESSION 09/15/2007   Arthritis    ASTHMA 05/15/2009   Asthma    DIABETES MELLITUS, TYPE II 12/11/2006   DIVERTICULOSIS, COLON 12/11/2006   Edema 01/15/2009   Gout    pt denies; toe was broken   Headache(784.0) 12/11/2006   Hyperlipidemia    Hypertension    HYPERTENSION NEC 08/14/2007   HYPOTHYROIDISM 12/11/2006   PELVIC PAIN, CHRONIC 08/14/2007   Restless leg syndrome    Past Surgical History:  Procedure Laterality Date   ABDOMINAL HYSTERECTOMY     APPENDECTOMY     Bladder Suspension     COLONOSCOPY  10/08/2017   Dr. Abran   LEFT HEART CATH AND CORONARY ANGIOGRAPHY N/A 09/05/2021   Procedure: LEFT HEART CATH AND CORONARY ANGIOGRAPHY;  Surgeon: Burnard Debby LABOR, MD;  Location: [facility] INVASIVE [facility];  Service: Cardiovascular;  Laterality: N/A;   UPPER GASTROINTESTINAL ENDOSCOPY     Patient Active Problem List   Diagnosis Date Noted   Mild obstructive sleep apnea 04/29/2023   Excessive daytime sleepiness 02/21/2023   Obesity (BMI 30.0-34.9) 02/21/2023   Grief 02/21/2023   Chest pain of uncertain etiology    Fracture of second toe, left, closed, initial encounter 09/26/2017   GERD (gastroesophageal reflux disease) 10/19/2014   Essential hypertension 08/16/2014   Breast pain, right 05/20/2014   Restless leg syndrome 05/20/2014   Hip pain,  bilateral 10/23/2011   Knee pain, bilateral 10/23/2011   EDEMA 01/15/2009   Hypothyroidism 12/11/2006   Diabetes type 2, uncontrolled 12/11/2006   Diverticulosis of colon 12/11/2006   Headache 12/11/2006    PCP: same  REFERRING PROVIDER: Darleene Merna COME  REFERRING DIAG: Gait Instability  THERAPY DIAG:  Gait instability  Unsteadiness on feet  Loss of balance  Rationale for Evaluation and Treatment: Rehabilitation  ONSET DATE: 3 years ago, exacerbated more recently  SUBJECTIVE:   SUBJECTIVE STATEMENT: She has fallen on stairs about 3 times recently; Catches step with her foot. Has had problems with Vertigo 2-3 years ago, but not presently. Sometimes gets off balance if she turns around quickly.  I have restless legs, worse at night, and my legs aren't very strong anymore. Hard to get up from chairs, and sometimes needs help getting out of the car. Sometimes I am not steady. I hang on to the shopping cart. She has diabetic neuropathy in her feet. Extensive OA ( right knee )  PERTINENT HISTORY: Multiple falls in the past year. Pt was seen in the ED on 01/25/2024 with left chest pain and nausea. She fell the previous night and earlier in the week due to poor balance. Her work up was unremarkable except for low Magnesium levels. Nausea has been improving. PAIN:  Are you having pain?  Yes: NPRS scale: 3/10, TMJ  Pain location: buttocks, abscess but not open, TMJ (10/10), hands and arms for OA 6/10 Pain description: dull ache in hands, buttocks Aggravating factors: chewing for TMJ, Sitting for buttocks, using hands, arms Relieving factors: Tylenol  arthritis  PRECAUTIONS: Diabetes II, Asthma, hypertension, Pelvic pain, Arthritis  RED FLAGS: Bowel or bladder incontinence: Yes: urine   WEIGHT BEARING RESTRICTIONS: No  FALLS:  Has patient fallen in last 6 months? Yes. 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    PATIENT SURVEYS:  LEFS  Extreme difficulty/unable (0), Quite a bit of difficulty (1), Moderate difficulty (2), Little difficulty (3), No difficulty (4) Survey date:    Any of your usual work, housework or school activities 3  2. Usual hobbies, recreational or sporting activities 3  3. Getting into/out of the bath 2  4. Walking between rooms 3  5. Putting on socks/shoes 3  6. Squatting  0  7. Lifting an object, like a bag of groceries from the floor 1  8. Performing light activities around your home 3  9. Performing heavy activities around your home 2  10. Getting into/out of a car 3  11. Walking 2 blocks 3  12. Walking 1 mile 1  13. Going up/down 10 stairs (1 flight) 1  14. Standing for 1 hour 1  15.  sitting for 1 hour 1  16. Running on even ground 0  17. Running on uneven ground 0  18. Making sharp turns while running fast 0  19. Hopping  0  20. Rolling over in bed 4  Score total:  35     COGNITION: Overall cognitive status: Within functional limits for tasks assessed     SENSATION: Light touch: Impaired   EDEMA:    MUSCLE LENGTH: Hamstrings: Right 80 deg; Left 80 deg FABER: mildly tight B  POSTURE: rounded shoulders and forward head  PALPATION:   LOWER EXTREMITY MMT:  MMT Right eval Left eval  Hip flexion 4/5 4+/5  Hip extension Can bridge Can bridge  Hip abduction 4 4  Hip adduction 4  4  Hip internal rotation 4+ 4+  Hip external rotation 4+ 4+  Knee flexion 4 5  Knee extension 3+ 4  Ankle dorsiflexion 5 5  Ankle plantarflexion    Ankle inversion    Ankle eversion     (Blank rows = not tested)    FUNCTIONAL TESTS:  5 times sit to stand: 23.25 legs felt shaky, excessive use of arms Timed up and go (TUG): 15.83   4 position balance test; wobbly with tandem stance but able to maintain x 10 sec with each foot forward, unable to maintain SLS either side for 10 sec  GAIT: Distance walked: 40 ft Assistive device utilized: None Level of assistance: SBA for safety Comments: unsteady, minimal arm swing/trunk rotation,  TREATMENT DATE: 02/04/2024    PATIENT EDUCATION:  Education details: POC, LOS, treatment interventions Person educated: Patient Education method: Explanation Education comprehension: verbalized understanding  HOME EXERCISE PROGRAM:   ASSESSMENT:  CLINICAL IMPRESSION: Patient is a 73 y.o. female who was seen today for physical therapy evaluation and treatment for gait instability. Pt ambulates with decreased arm swing and trunk swing and intermittent lateral deviation. She requires excessive use of arms to rise from chairs, and demonstrates decreased balance bilaterally. Her 5 times sit to stand and TUG were below average for her age group. She has had several falls on stairs. She will benefit from skilled PT to address deficits and return pt to a safer, more functional strength and gait pattern.  OBJECTIVE IMPAIRMENTS: Abnormal gait, decreased balance, decreased knowledge of condition, difficulty walking, decreased strength, increased edema, impaired sensation, postural dysfunction, and pain.   ACTIVITY LIMITATIONS: carrying, squatting, stairs, bathing, and locomotion level  PARTICIPATION LIMITATIONS: doing most things but  at risk for falls  PERSONAL FACTORS: Fitness and neuropathy are also affecting patient's functional outcome.   REHAB POTENTIAL: Good  CLINICAL DECISION MAKING: Stable/uncomplicated  EVALUATION COMPLEXITY: Low   GOALS: Goals reviewed with patient? Yes  SHORT TERM GOALS: Target date: 02/25/2024 Pt will be independent with HEP to improve Leg strength/balance Baseline: Goal status: INITIAL  2.  Pt. Will decrease Tug time to 12 sec. Or less to decrease fall risk Baseline:  Goal status: INITIAL  3.  Pt will perform 5x sit to stand in 12 seconds or less with min use of UE's to decrease fall risk Baseline:  Goal status: INITIAL  4.  Pt will be able to maintain SLS x 10 sec each side to demonstrate improved balance. Baseline:  Goal status: INITIAL  5.  Pt will be able to rise from chair with ax pad without UE to demonstrate improved strength Baseline:  Goal status: INITIAL   LONG TERM GOALS: Target date: 03/17/2024  Pt will perform TUG in 10 seconds or less to decrease fall risk Baseline:  Goal status: INITIAL  2.  Pt will perform 5 x sit to stand in 10 sec or less with minimal use of UE's pt will perform TUG to decrease fall risk Baseline:  Goal status: INITIAL  3.  Pt. Will be able to perform sit to stand without use of UE's Baseline:  Goal status: INITIAL  4.  Pt will be able to perform SLS x 15 sec each side to demonstrate improved balance Baseline:  Goal status: INITIAL  5.  Pt will ambulate 50 feet with improved arm swing and trunk rotation and decreased lateral deviation Baseline:  Goal status: INITIAL 6. Pt will have bilateral quad strength 5/5 for improvement with  fxl activties.    PLAN:  PT FREQUENCY: 2x/week  PT DURATION: 6 weeks  PLANNED INTERVENTIONS: 97164- PT Re-evaluation, 97110-Therapeutic exercises, 97530- Therapeutic activity, 97112- Neuromuscular re-education, 97535- Self Care, 02859- Manual therapy, and 97116- Gait training  PLAN FOR  NEXT SESSION: initiate HEP for hips and knees,sit to stand high table,balance activities, add UE strength when ready.  PHYSICAL THERAPY DISCHARGE SUMMARY  Visits from Start of Care: 1  Current functional level related to goals / functional outcomes: Unknown;did not return   Remaining deficits: unknown   Education / Equipment:    Patient agrees to discharge. Patient goals were not assessed. Patient is being discharged due to the patient's request.Pt called to cancel all appts with no reason given.  Grayce JINNY Sheldon, PT 02/04/2024,  12:56 PM

## 2024-02-04 ENCOUNTER — Other Ambulatory Visit: Payer: Self-pay

## 2024-02-04 ENCOUNTER — Ambulatory Visit: Attending: Adult Health

## 2024-02-04 DIAGNOSIS — R2689 Other abnormalities of gait and mobility: Secondary | ICD-10-CM | POA: Diagnosis present

## 2024-02-04 DIAGNOSIS — R2681 Unsteadiness on feet: Secondary | ICD-10-CM | POA: Diagnosis present

## 2024-02-10 ENCOUNTER — Ambulatory Visit: Admitting: Dietician

## 2024-02-11 ENCOUNTER — Ambulatory Visit

## 2024-02-13 ENCOUNTER — Ambulatory Visit

## 2024-02-18 ENCOUNTER — Ambulatory Visit

## 2024-02-20 ENCOUNTER — Ambulatory Visit

## 2024-02-25 ENCOUNTER — Ambulatory Visit

## 2024-02-27 ENCOUNTER — Ambulatory Visit

## 2024-03-03 ENCOUNTER — Ambulatory Visit

## 2024-03-04 NOTE — Progress Notes (Signed)
 [name]                                          MRN: [id number]   03/04/2024   The [facility] Quality Team Specialist reviewed this patient medical record for the purposes of chart review for care gap closure. The following were reviewed: abstraction for care gap closure-diabetic eye exam.    [facility] Quality Team

## 2024-03-05 ENCOUNTER — Ambulatory Visit

## 2024-03-10 ENCOUNTER — Ambulatory Visit

## 2024-03-17 ENCOUNTER — Ambulatory Visit

## 2024-03-19 ENCOUNTER — Ambulatory Visit

## 2024-04-03 ENCOUNTER — Encounter: Payer: Self-pay | Admitting: Podiatry

## 2024-04-03 ENCOUNTER — Ambulatory Visit: Admitting: Podiatry

## 2024-04-03 ENCOUNTER — Encounter: Payer: Self-pay | Admitting: Internal Medicine

## 2024-04-03 DIAGNOSIS — B351 Tinea unguium: Secondary | ICD-10-CM

## 2024-04-03 DIAGNOSIS — M79674 Pain in right toe(s): Secondary | ICD-10-CM | POA: Diagnosis not present

## 2024-04-03 DIAGNOSIS — E1142 Type 2 diabetes mellitus with diabetic polyneuropathy: Secondary | ICD-10-CM | POA: Diagnosis not present

## 2024-04-03 DIAGNOSIS — M79675 Pain in left toe(s): Secondary | ICD-10-CM

## 2024-04-03 NOTE — Progress Notes (Signed)
"  °  Subjective:  Patient ID: [name], female    DOB: [birth year],   MRN: [id number]  Chief Complaint  Patient presents with   Diabetes    Toenails Saw Darleene Shape, NP - 01/30/2024; A1c - 7.1    73 y.o. female presents for concern of thickened elongated and painful nails that are difficult to trim. Requesting to have them trimmed today. Relates burning and tingling in their feet. Patient is diabetic and last A1c was  Lab Results  Component Value Date   HGBA1C 7.1 (H) 01/30/2024   .   PCP:  Shape Darleene, NP    . Denies any other pedal complaints. Denies n/v/f/c.   Past Medical History:  Diagnosis Date   Allergy    Anemia    Anxiety    ANXIETY DEPRESSION 09/15/2007   Arthritis    ASTHMA 05/15/2009   Asthma    DIABETES MELLITUS, TYPE II 12/11/2006   DIVERTICULOSIS, COLON 12/11/2006   Edema 01/15/2009   Gout    pt denies; toe was broken   Headache(784.0) 12/11/2006   Hyperlipidemia    Hypertension    HYPERTENSION NEC 08/14/2007   HYPOTHYROIDISM 12/11/2006   PELVIC PAIN, CHRONIC 08/14/2007   Restless leg syndrome     Objective:  Physical Exam: Vascular: DP/PT pulses 2/4 bilateral. CFT <3 seconds. Absent hair growth on digits. Edema noted to bilateral lower extremities. Xerosis noted bilaterally.  Skin. No lacerations or abrasions bilateral feet. Nails 1-5 bilateral  are thickened discolored and elongated with subungual debris.  Musculoskeletal: MMT 5/5 bilateral lower extremities in DF, PF, Inversion and Eversion. Deceased ROM in DF of ankle joint.  Neurological: Sensation intact to light touch. Protective sensation diminished bilateral.    Assessment:   1. Pain due to onychomycosis of toenails of both feet   2. Type 2 diabetes mellitus with peripheral neuropathy (HCC)       Plan:  Patient was evaluated and treated and all questions answered. -Discussed and educated patient on diabetic foot care, especially with  regards to the vascular,  neurological and musculoskeletal systems.  -Stressed the importance of good glycemic control and the detriment of not  controlling glucose levels in relation to the foot. -Discussed supportive shoes at all times and checking feet regularly.  -Mechanically debrided all nails 1-5 bilateral using sterile nail nipper and filed with dremel without incident  -Answered all patient questions -Patient to return  in 3 months for at risk foot care -Patient advised to call the office if any problems or questions arise in the meantime.   Asberry Failing, DPM    "

## 2024-05-19 ENCOUNTER — Other Ambulatory Visit: Payer: Self-pay | Admitting: Internal Medicine

## 2024-05-19 DIAGNOSIS — I10 Essential (primary) hypertension: Secondary | ICD-10-CM

## 2024-05-29 ENCOUNTER — Ambulatory Visit: Admitting: Internal Medicine

## 2024-07-02 ENCOUNTER — Ambulatory Visit: Admitting: Podiatry

## 2025-01-20 ENCOUNTER — Ambulatory Visit
